# Patient Record
Sex: Male | Born: 1950 | Race: White | Hispanic: No | Marital: Married | Smoking: Never smoker
Health system: Southern US, Community
[De-identification: ages and names within clinical notes are randomized; demographics above are authoritative.]

## PROBLEM LIST (undated history)

## (undated) ENCOUNTER — Ambulatory Visit: Discharge status: Absent without leave | Payer: Managed Care, Other (non HMO)

## (undated) DIAGNOSIS — H9191 Unspecified hearing loss, right ear: Secondary | ICD-10-CM

## (undated) DIAGNOSIS — B3781 Candidal esophagitis: Secondary | ICD-10-CM

## (undated) DIAGNOSIS — T884XXA Failed or difficult intubation, initial encounter: Secondary | ICD-10-CM

## (undated) DIAGNOSIS — N200 Calculus of kidney: Secondary | ICD-10-CM

## (undated) DIAGNOSIS — G4730 Sleep apnea, unspecified: Secondary | ICD-10-CM

## (undated) DIAGNOSIS — K222 Esophageal obstruction: Secondary | ICD-10-CM

## (undated) DIAGNOSIS — E039 Hypothyroidism, unspecified: Secondary | ICD-10-CM

## (undated) DIAGNOSIS — Z87442 Personal history of urinary calculi: Secondary | ICD-10-CM

## (undated) DIAGNOSIS — G7000 Myasthenia gravis without (acute) exacerbation: Secondary | ICD-10-CM

## (undated) DIAGNOSIS — E119 Type 2 diabetes mellitus without complications: Secondary | ICD-10-CM

## (undated) DIAGNOSIS — I10 Essential (primary) hypertension: Secondary | ICD-10-CM

## (undated) DIAGNOSIS — E079 Disorder of thyroid, unspecified: Secondary | ICD-10-CM

## (undated) DIAGNOSIS — K219 Gastro-esophageal reflux disease without esophagitis: Secondary | ICD-10-CM

## (undated) HISTORY — PX: TONSILLECTOMY: SUR1361

## (undated) HISTORY — PX: KIDNEY STONE SURGERY: SHX686

## (undated) HISTORY — DX: Candidal esophagitis: B37.81

## (undated) HISTORY — PX: CHOLECYSTECTOMY: SHX55

## (undated) HISTORY — PX: PALATE SURGERY: SHX729

## (undated) HISTORY — PX: NASAL SEPTUM SURGERY: SHX37

## (undated) HISTORY — DX: Gastro-esophageal reflux disease without esophagitis: K21.9

## (undated) HISTORY — DX: Esophageal obstruction: K22.2

## (undated) HISTORY — PX: HERNIA REPAIR: SHX51

## (undated) HISTORY — PX: THYMECTOMY: SHX1063

---

## 2000-03-19 ENCOUNTER — Encounter: Payer: Self-pay | Admitting: Otolaryngology

## 2000-03-19 ENCOUNTER — Encounter: Admission: RE | Admit: 2000-03-19 | Discharge: 2000-03-19 | Payer: Self-pay | Admitting: Otolaryngology

## 2000-03-21 ENCOUNTER — Encounter (INDEPENDENT_AMBULATORY_CARE_PROVIDER_SITE_OTHER): Payer: Self-pay | Admitting: *Deleted

## 2000-03-21 ENCOUNTER — Ambulatory Visit (HOSPITAL_BASED_OUTPATIENT_CLINIC_OR_DEPARTMENT_OTHER): Admission: RE | Admit: 2000-03-21 | Discharge: 2000-03-21 | Payer: Self-pay | Admitting: Otolaryngology

## 2001-04-01 ENCOUNTER — Encounter: Payer: Self-pay | Admitting: Internal Medicine

## 2001-04-01 ENCOUNTER — Ambulatory Visit (HOSPITAL_COMMUNITY): Admission: RE | Admit: 2001-04-01 | Discharge: 2001-04-01 | Payer: Self-pay | Admitting: Internal Medicine

## 2002-02-07 ENCOUNTER — Encounter: Payer: Self-pay | Admitting: Internal Medicine

## 2002-02-07 ENCOUNTER — Ambulatory Visit (HOSPITAL_COMMUNITY): Admission: RE | Admit: 2002-02-07 | Discharge: 2002-02-07 | Payer: Self-pay | Admitting: Internal Medicine

## 2002-02-17 ENCOUNTER — Ambulatory Visit (HOSPITAL_COMMUNITY): Admission: RE | Admit: 2002-02-17 | Discharge: 2002-02-17 | Payer: Self-pay | Admitting: Family Medicine

## 2002-02-17 ENCOUNTER — Encounter: Payer: Self-pay | Admitting: Family Medicine

## 2002-07-13 ENCOUNTER — Emergency Department (HOSPITAL_COMMUNITY): Admission: EM | Admit: 2002-07-13 | Discharge: 2002-07-13 | Payer: Self-pay | Admitting: Internal Medicine

## 2003-06-15 ENCOUNTER — Encounter: Payer: Self-pay | Admitting: Family Medicine

## 2003-06-15 ENCOUNTER — Ambulatory Visit (HOSPITAL_COMMUNITY): Admission: RE | Admit: 2003-06-15 | Discharge: 2003-06-15 | Payer: Self-pay | Admitting: Family Medicine

## 2003-11-21 HISTORY — PX: COLONOSCOPY: SHX174

## 2004-09-30 ENCOUNTER — Ambulatory Visit (HOSPITAL_COMMUNITY): Admission: RE | Admit: 2004-09-30 | Discharge: 2004-09-30 | Payer: Self-pay | Admitting: General Surgery

## 2006-11-28 ENCOUNTER — Emergency Department (HOSPITAL_COMMUNITY): Admission: EM | Admit: 2006-11-28 | Discharge: 2006-11-28 | Payer: Self-pay | Admitting: Emergency Medicine

## 2008-07-29 ENCOUNTER — Ambulatory Visit (HOSPITAL_COMMUNITY): Admission: RE | Admit: 2008-07-29 | Discharge: 2008-07-29 | Payer: Self-pay | Admitting: Internal Medicine

## 2008-09-10 ENCOUNTER — Encounter (HOSPITAL_COMMUNITY): Admission: RE | Admit: 2008-09-10 | Discharge: 2008-11-17 | Payer: Self-pay | Admitting: Endocrinology

## 2009-05-19 ENCOUNTER — Ambulatory Visit (HOSPITAL_COMMUNITY): Admission: RE | Admit: 2009-05-19 | Discharge: 2009-05-19 | Payer: Self-pay | Admitting: Family Medicine

## 2009-05-24 ENCOUNTER — Emergency Department (HOSPITAL_COMMUNITY): Admission: EM | Admit: 2009-05-24 | Discharge: 2009-05-24 | Payer: Self-pay | Admitting: Emergency Medicine

## 2009-05-30 ENCOUNTER — Emergency Department (HOSPITAL_COMMUNITY): Admission: EM | Admit: 2009-05-30 | Discharge: 2009-05-30 | Payer: Self-pay | Admitting: Emergency Medicine

## 2009-06-01 ENCOUNTER — Ambulatory Visit (HOSPITAL_COMMUNITY): Admission: RE | Admit: 2009-06-01 | Discharge: 2009-06-01 | Payer: Self-pay | Admitting: Urology

## 2009-06-16 ENCOUNTER — Ambulatory Visit (HOSPITAL_COMMUNITY): Admission: RE | Admit: 2009-06-16 | Discharge: 2009-06-16 | Payer: Self-pay | Admitting: Urology

## 2009-06-30 ENCOUNTER — Ambulatory Visit (HOSPITAL_COMMUNITY): Admission: RE | Admit: 2009-06-30 | Discharge: 2009-06-30 | Payer: Self-pay | Admitting: Urology

## 2009-07-14 ENCOUNTER — Ambulatory Visit (HOSPITAL_COMMUNITY): Admission: RE | Admit: 2009-07-14 | Discharge: 2009-07-14 | Payer: Self-pay | Admitting: Urology

## 2009-07-16 ENCOUNTER — Ambulatory Visit (HOSPITAL_COMMUNITY): Admission: RE | Admit: 2009-07-16 | Discharge: 2009-07-16 | Payer: Self-pay | Admitting: Urology

## 2009-07-16 ENCOUNTER — Encounter (INDEPENDENT_AMBULATORY_CARE_PROVIDER_SITE_OTHER): Payer: Self-pay | Admitting: Urology

## 2010-07-21 DIAGNOSIS — K222 Esophageal obstruction: Secondary | ICD-10-CM | POA: Insufficient documentation

## 2010-07-21 HISTORY — DX: Esophageal obstruction: K22.2

## 2010-07-21 HISTORY — PX: UPPER GASTROINTESTINAL ENDOSCOPY: SHX188

## 2010-08-06 ENCOUNTER — Ambulatory Visit: Payer: Self-pay | Admitting: Gastroenterology

## 2010-08-06 ENCOUNTER — Ambulatory Visit (HOSPITAL_COMMUNITY): Admission: EM | Admit: 2010-08-06 | Discharge: 2010-08-06 | Payer: Self-pay | Admitting: Emergency Medicine

## 2010-09-07 ENCOUNTER — Encounter: Payer: Self-pay | Admitting: Gastroenterology

## 2010-09-07 ENCOUNTER — Encounter (INDEPENDENT_AMBULATORY_CARE_PROVIDER_SITE_OTHER): Payer: Self-pay | Admitting: *Deleted

## 2010-09-21 ENCOUNTER — Encounter (INDEPENDENT_AMBULATORY_CARE_PROVIDER_SITE_OTHER): Payer: Self-pay | Admitting: *Deleted

## 2010-12-22 NOTE — Miscellaneous (Signed)
Summary: OP REPORT  Clinical Lists Changes  [name], [name]               ACCOUNT NO.:  [id number]      MEDICAL RECORD NO.:  [id number]          PATIENT TYPE:  EMS      LOCATION:  ED                            FACILITY:  [facility]      PHYSICIAN:  Jonette Eva, M.D.     DATE OF BIRTH:  [birth year]      DATE OF PROCEDURE:  08/06/2010   DATE OF DISCHARGE:                                  OPERATIVE REPORT      THIS DOCUMENT HAS NOT BEEN REVIEWED OR ELECTRONICALLY SIGNED BY THE   DICTATOR OR ATTENDING PHYSICIAN      REFERRING PHYSICIAN:  Nicoletta Dress. Colon Branch, MD      PRIMARY CARE PHYSICIAN:  Corey Rear. Fusco, MD      PROCEDURE:  Esophagogastroduodenoscopy with cold forceps biopsy of the   gastric mucosa, removal of food impaction and Savary dilation of 16 mm.      INDICATION FOR EXAM:  [name] is a 60 year old male who felt chicken   get hung in his esophagus on Monday.  He was eating steak last night   around 6:30 and has been unable to swallow even his own secretions.      FINDINGS:   1. Food impaction in the mid to distal esophagus.  The food impaction       was reduced in size and able to be advanced into the stomach.   2. 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  Mid to distal       esophageal stricture.  No evidence of erosions, ulcerations, or       Barrett.   3. Small hiatal hernia.   4. Diffuse erythema in the stomach without ulcerations.  Biopsies       obtained via cold forceps to evaluate for H. pylori gastritis.   5. Normal duodenal bulb and second portion of the duodenum.   6. Hiatal hernia.      DIAGNOSES:   1. Impacted food bolus secondary to peptic stricture.   2. Mild mucosal trauma without disruption in the mid esophagus.   3. Moderate gastritis.      RECOMMENDATIONS:   1. He should take Prilosec 20 mg every morning indefinitely.   2. Follow up in 3 months with [name] regarding dysphagia.   3. We will call him with the results of his biopsies.   If he has H.       pylori, we will treat with antibiotics.   4. He may resume his previous diet.      MEDICATIONS:   1. Demerol 75 mg IV.   2. Versed 5 mg IV.      PROCEDURE TECHNIQUE:  Physical exam was performed.  Informed consent was   obtained from the patient after explaining the benefits, risks, and   alternatives to the procedure.  The patient was connected to monitor and   placed in left lateral position.  Continuous oxygen was provided by   nasal cannula and IV medicine was administered with an indwelling  cannula.  After administration of sedation, the patient's esophagus was   intubated.  Scope was advanced under direct visualization to the mid to   distal esophagus where the impacted food bolus was identified.  The   scope was withdrawn.  The esophageal overtube was placed over the   diagnostic gastroscope.  The gastroscope was advanced under direct   visualization to the mid esophagus.  The over tube was advanced with   mild resistance.  The scope was withdrawn and the internal cannula was   removed.  The food bolus was educed in size with a Talon grasping   device.  It was able to be, then subsequently, progressed to the distal   esophagus in the small hiatal hernia.  The scope was advanced under   direct visualization to the second portion of the duodenum.  The   biopsies were obtained in the stomach.  The scope and the overtube were   withdrawn.  The patient's esophagus was then intubated and advanced   under direct visualization to the antrum.  The Savary guidewire was   placed as the scope was withdrawn.  The scope was drawn by carefully   examining the color, texture, anatomy, and integrity mucosa on the way   out.  The patient's esophagus was dilated from 12.8 to 16 mm.  The   dilators passed with mild resistance.  The dilator and guidewire were   removed.  The patient was recovered in endoscopy and discharged home in   satisfactory condition.         PATH:  CHRONIC GASTRITIS      Jonette Eva, M.D.            THIS DOCUMENT HAS NOT BEEN REVIEWED OR ELECTRONICALLY SIGNED BY THE   DICTATOR OR ATTENDING PHYSICIAN      SF/MEDQ  D:  08/06/2010  T:  08/06/2010  Job:  [id number]      cc:   Corey Rear. Sherwood Gambler, MD   Fax: [phone]      Electronically Signed by Jonette Eva M.D. on 09/06/2010 04:54:19 PM

## 2010-12-22 NOTE — Letter (Signed)
Summary: Recall Office Visit  [facility]  [address]   [place], [place] [address]   Phone: [phone]  Fax: [phone]      September 21, 2010   [name] [address] [place], [place]  [address] 1951-04-13   Dear [name],   According to our records, it is time for you to schedule a follow-up office visit with [place].   At your convenience, please call [phone] to schedule an office visit. If you have any questions, concerns, or feel that this letter is in error, we would appreciate your call.   Sincerely,    Diana Eves  [facility] Ph: [phone]   Fax: [phone]

## 2010-12-22 NOTE — Letter (Signed)
Summary: path/op report  path/op report   Imported By: Diana Eves 09/07/2010 11:28:49  _____________________________________________________________________  External Attachment:    Type:   Image     Comment:   External Document

## 2010-12-22 NOTE — Miscellaneous (Signed)
Summary: PATHOLOGY  Clinical Lists Changes SP-Surgical Pathology - STATUS: Final  .                                         Perform Date: 17Sep11 00:01  Ordered By: DEFAULT MD , PROVIDER         Ordered Date:  Facility: [facility]                               Department: CPATH  Service Report Text  [facility]   [address]   [place] , [place] [address]   Telephone : [phone] Fax : [phone]    REPORT OF SURGICAL PATHOLOGY    Accession #: [id number]   Patient Name: [name], [name]   Visit # : [id number]    MRN: [id number]   Physician: Jonette Eva   DOB/Age 07-04-1951 (Age: 21) Gender: M   Collected Date: 08/06/2010   Received Date: 08/08/2010    FINAL DIAGNOSIS    1. Stomach, biopsy, :   BENIGN GASTRIC MUCOSA WITH CHRONIC GASTRITIS. NO HELICOBACTER PYLORI ORGANISMS   OR INTESTINAL METAPLASIA IDENTIFIED.    DATE SIGNED OUT: 08/09/2010   ELECTRONIC SIGNATURE : Smir M.D., Bassam, Pathologist, Electronic Signature    MICROSCOPIC DESCRIPTION   1. A Warthin-Starry stain is performed to determine the possibility of the   presence of Helicobacter pylori. The Warthin-Starry stain is negative for   organisms of Helicobacter pylori.    CASE COMMENTS   STAINS USED IN DIAGNOSIS:   Warthin-Starry Stain    CLINICAL HISTORY    SPECIMEN(S) OBTAINED   1. Stomach, biopsy,    SPECIMEN COMMENTS:   SPECIMEN CLINICAL INFORMATION:   1. gastritis    Gross Description   1. Received in formalin are tan, soft tissue fragments that are submitted in   toto. Number: Four pieces, Size: 0.3 to 0.7 cm, (1 B). TB/eps 08/08/10    Report signed out from the following location(s)   [place]   [address], [place] [address] CLIA #: [id number]    [facility]   [address] [place], [place] [address] CLIA #: [id number]  Additional Information  HL7 RESULT STATUS : F  External IF Update Timestamp : 2010-08-09:20:29:00.000000

## 2010-12-30 ENCOUNTER — Encounter: Payer: Self-pay | Admitting: Urgent Care

## 2011-01-05 NOTE — Medication Information (Signed)
Summary: OMEPRAZOLE 20MG   OMEPRAZOLE 20MG    Imported By: Rexene Alberts 12/30/2010 10:56:45  _____________________________________________________________________  External Attachment:    Type:   Image     Comment:   External Document  Appended Document: OMEPRAZOLE 20MG  Please sched 3 mo FU re: FU peptic stricture   Prescriptions: OMEPRAZOLE 20 MG CPDR (OMEPRAZOLE) 1 by mouth daily for acid reflux  #31 x 2   Entered and Authorized by:   Joselyn Arrow FNP-BC   Signed by:   Joselyn Arrow FNP-BC on 12/30/2010   Method used:   Electronically to        [facility] (retail)       [address]       [place], [place]  [address]       Ph: [phone]       Fax: [phone]   RxID:   [id number]

## 2011-01-11 ENCOUNTER — Encounter (INDEPENDENT_AMBULATORY_CARE_PROVIDER_SITE_OTHER): Payer: Self-pay | Admitting: *Deleted

## 2011-01-17 NOTE — Letter (Signed)
Summary: Recall Office Visit  [facility]  [address]   [place], [place] [address]   Phone: [phone]  Fax: [phone]      January 11, 2011   Nixxon Keelan [address] [place], [place]  [address] [birth year]   Dear Mr. Patrie,   According to our records, it is time for you to schedule a follow-up office visit with [place].   At your convenience, please call [phone] to schedule an office visit. If you have any questions, concerns, or feel that this letter is in error, we would appreciate your call.   Sincerely,    Diana Eves  [facility] Ph: [phone]   Fax: [phone]

## 2011-02-25 LAB — CBC
HCT: 42.1 % (ref 39.0–52.0)
Hemoglobin: 14.9 g/dL (ref 13.0–17.0)
MCHC: 35.5 g/dL (ref 30.0–36.0)
MCV: 90.5 fL (ref 78.0–100.0)
Platelets: 198 10*3/uL (ref 150–400)
RBC: 4.66 MIL/uL (ref 4.22–5.81)
RDW: 13.6 % (ref 11.5–15.5)
WBC: 5 10*3/uL (ref 4.0–10.5)

## 2011-02-25 LAB — BASIC METABOLIC PANEL
BUN: 12 mg/dL (ref 6–23)
CO2: 30 mEq/L (ref 19–32)
Calcium: 9.5 mg/dL (ref 8.4–10.5)
Chloride: 102 mEq/L (ref 96–112)
Creatinine, Ser: 1.03 mg/dL (ref 0.4–1.5)
GFR calc Af Amer: >60 mL/min (ref >60)
GFR calc non Af Amer: >60 mL/min (ref >60)
Glucose, Bld: 203 mg/dL — ABNORMAL HIGH (ref 70–99)
Potassium: 4.2 mEq/L (ref 3.5–5.1)
Sodium: 138 mEq/L (ref 135–145)

## 2011-02-25 LAB — STONE ANALYSIS: Stone Weight KSTONE: 0.125 g

## 2011-02-25 LAB — GLUCOSE, CAPILLARY: Glucose-Capillary: 144 mg/dL — ABNORMAL HIGH (ref 70–99)

## 2011-02-26 LAB — URINALYSIS, ROUTINE W REFLEX MICROSCOPIC
Bilirubin Urine: NEGATIVE
Bilirubin Urine: NEGATIVE
Glucose, UA: 250 mg/dL — AB
Glucose, UA: NEGATIVE mg/dL
Hgb urine dipstick: NEGATIVE
Ketones, ur: NEGATIVE mg/dL
Leukocytes, UA: NEGATIVE
Nitrite: NEGATIVE
Nitrite: NEGATIVE
Protein, ur: NEGATIVE mg/dL
Protein, ur: NEGATIVE mg/dL
Specific Gravity, Urine: >1.03 — ABNORMAL HIGH (ref 1.005–1.030)
Specific Gravity, Urine: >1.03 — ABNORMAL HIGH (ref 1.005–1.030)
Urobilinogen, UA: 0.2 mg/dL (ref 0.0–1.0)
Urobilinogen, UA: 0.2 mg/dL (ref 0.0–1.0)
pH: 5.5 (ref 5.0–8.0)
pH: 5.5 (ref 5.0–8.0)

## 2011-02-26 LAB — URINE MICROSCOPIC-ADD ON

## 2011-02-26 LAB — URINE CULTURE
Colony Count: NO GROWTH
Culture: NO GROWTH

## 2011-02-26 LAB — POCT I-STAT, CHEM 8
BUN: 14 mg/dL (ref 6–23)
Calcium, Ion: 1.23 mmol/L (ref 1.12–1.32)
Chloride: 102 mEq/L (ref 96–112)
Creatinine, Ser: 1 mg/dL (ref 0.4–1.5)
Glucose, Bld: 208 mg/dL — ABNORMAL HIGH (ref 70–99)
HCT: 42 % (ref 39.0–52.0)
Hemoglobin: 14.3 g/dL (ref 13.0–17.0)
Potassium: 3.7 mEq/L (ref 3.5–5.1)
Sodium: 139 mEq/L (ref 135–145)
TCO2: 25 mmol/L (ref 0–100)

## 2011-02-27 LAB — CREATININE, SERUM
Creatinine, Ser: 0.91 mg/dL (ref 0.4–1.5)
GFR calc Af Amer: >60 mL/min (ref >60)
GFR calc non Af Amer: >60 mL/min (ref >60)

## 2011-03-01 ENCOUNTER — Ambulatory Visit (INDEPENDENT_AMBULATORY_CARE_PROVIDER_SITE_OTHER): Payer: Self-pay | Admitting: Gastroenterology

## 2011-03-01 ENCOUNTER — Encounter: Payer: Self-pay | Admitting: Gastroenterology

## 2011-03-01 VITALS — BP 129/84 | HR 85 | Temp 98.2°F | Ht 73.0 in | Wt 226.8 lb

## 2011-03-01 DIAGNOSIS — K219 Gastro-esophageal reflux disease without esophagitis: Secondary | ICD-10-CM

## 2011-03-01 DIAGNOSIS — K222 Esophageal obstruction: Secondary | ICD-10-CM

## 2011-03-01 MED ORDER — OMEPRAZOLE 20 MG PO CPDR: 20.0000 mg | DELAYED_RELEASE_CAPSULE | Freq: Every day | ORAL | Status: DC

## 2011-03-01 NOTE — Progress Notes (Signed)
  Subjective:    Patient ID: [name], male    DOB: 03-Oct-1951, 60 y.o.   MRN: [id number]  HPI Pt last seen for food impaction. EGD/dilation to 16 mm. No swallowing problems. Taking OMP every day.  Past Medical History  Diagnosis Date  . GERD (gastroesophageal reflux disease)   . Esophageal stricture SEP 2011    FOOD IMPACTION-->SAV DIL 16 MM     Review of Systems     Objective:   Physical Exam  Constitutional: He appears well-developed and well-nourished. No distress.  HENT:  Head: Normocephalic and atraumatic.  Cardiovascular: Normal rate and regular rhythm.   Pulmonary/Chest: Effort normal and breath sounds normal.  Abdominal: Soft. Bowel sounds are normal.          Assessment & Plan:

## 2011-03-02 ENCOUNTER — Encounter: Payer: Self-pay | Admitting: Gastroenterology

## 2011-03-02 DIAGNOSIS — K219 Gastro-esophageal reflux disease without esophagitis: Secondary | ICD-10-CM | POA: Insufficient documentation

## 2011-03-02 DIAGNOSIS — K222 Esophageal obstruction: Secondary | ICD-10-CM | POA: Insufficient documentation

## 2011-03-02 NOTE — Assessment & Plan Note (Signed)
Pt doing well. No problems swallowing or with food impaction. Continue Prilosec indefinitely. OPV in 12 mos.

## 2011-03-02 NOTE — Progress Notes (Signed)
Reminder in epic to follow up in one year °

## 2011-03-08 ENCOUNTER — Ambulatory Visit: Payer: Self-pay | Admitting: Gastroenterology

## 2011-04-04 NOTE — Op Note (Signed)
NAMEHOLLAND, [name]               ACCOUNT NO.:  [id number]   MEDICAL RECORD NO.:  [id number]          PATIENT TYPE:  AMB   LOCATION:  DAY                           FACILITY:  [facility]   PHYSICIAN:  Dennie Maizes, M.D.   DATE OF BIRTH:  08-Sep-1951   DATE OF PROCEDURE:  07/16/2009  DATE OF DISCHARGE:  07/16/2009                               OPERATIVE REPORT   PREOPERATIVE DIAGNOSIS:  Left distal ureteral calculus with obstruction,  post extracorporeal shockwave lithotripsy.   POSTOPERATIVE DIAGNOSIS:  Left distal ureteral calculus with  obstruction, post extracorporeal shockwave lithotripsy.   OPERATIVE PROCEDURES:  Cystoscopy, left retrograde pyelogram, left  ureteroscopic stone extraction, and left ureteral stent placement.   ANESTHESIA:  Spinal.   SURGEON:  Dennie Maizes, MD   COMPLICATIONS:  None.   INDICATIONS FOR PROCEDURE:  This 60 year old male had a left distal  ureteral calculus with obstruction.  He has undergone ESL about a month  ago.  He is unable to pass the stone fragments.  He was taken to the  operating room today for cystoscopy, left retrograde pyelogram,  ureteroscopic stone extraction, and a stent placement.   DESCRIPTION OF PROCEDURE:  Spinal anesthesia was induced and the patient  was placed on the OR table in the dorsal lithotomy position.  Lower  abdomen and genitalia were prepped and draped in a sterile fashion.  Cystoscopy was done with the 20-French scope.  Urethra was normal.  There was moderate hypertrophy of the prostate with partial obstruction  of bladder neck area.  The bladder was not examined.  There was  prominence and erythema of the left ureteral orifice and intramural  ureter.  The rest of the bladder mucosa was normal.  A 5-French wedge  catheter was then placed in the left ureteral orifice.  About 5 mL of  Renografin-60 were injected into the collecting system. This revealed  dilated left distal ureter with a filling defects suggestive  of the  presence of the stone fragments.   A 5-French open-ended catheter was then placed in the left ureteral  orifice.  A 0.13-gauge Bentson guide was then inserted to the left renal  pelvis without any difficulty.  The distal ureter was dilated using an  18-French 10 cm length balloon dilating catheter.  The balloon dilating  catheter was then removed leaving the guidewire in place.  Ureteroscopy  was done with a 0.5 French rigid ureteroscope.  There is a 7-mm size  stone with small fragments surrounding it were noted in the left distal  ureter about 6 cm above the ureteral orifice.  The stones were extracted  using a nitinol 0 wire basket without any difficulty.  Examination of  the ureter was done after the pelvic brim and no  other abnormality was noted.  A 6.26-cm size stent was then inserted to  the left collecting system.  The instruments were removed.  The patient  will return to the office in a few days for removal of the left ureteral  stent with a string.  He was transferred to the PACU in a satisfactory  condition.  Dennie Maizes, M.D.  Electronically Signed     SK/MEDQ  D:  07/16/2009  T:  07/17/2009  Job:  [id number]   cc:   Kirk Ruths, M.D.  Fax: [phone]

## 2011-04-04 NOTE — H&P (Signed)
[name], [name]               ACCOUNT NO.:  [id number]   MEDICAL RECORD NO.:  [id number]         PATIENT TYPE:  AMB   LOCATION:  DAY                           FACILITY:  [facility]   PHYSICIAN:  Dennie Maizes, M.D.   DATE OF BIRTH:  Jun 14, 1951   DATE OF ADMISSION:  07/16/2009  DATE OF DISCHARGE:  LH                              HISTORY & PHYSICAL   CHIEF COMPLAINT:  Left flank pain, left distal ureteral calculi with  obstruction.   HISTORY OF PRESENT ILLNESS:  This 60 year old male was referred to me  frm the ER at [facility]>  He has had recurrent urolithiasis for several years.  He passed several stones so far.  Was evaluated for left flank pain last  month with a CT scan of the abdomen and pelvis.  This revealed a 6 mm  sized stone in the ureteropelvic junction area on the left side with  obstruction and hydronephrosis.  A follow-up KUB revealed the stone had  migrated to the distal ureter at the level of the sacrum.  The patient  has undergone extracorporeal shock wave lithotripsy and left distal  ureteral calculus at [facility] about one month ago.  The stone has been fragmented.  The stone fragments have migrated to the  ureterovesical junction area.  The patient is unable to pass the stone  fragments.  He is brought to the [facility] today for  cystoscopy, left retrograde pyelogram, left ureteroscopy and stone  extraction with stent placement.  He denied having fever, chills,  voiding difficulty or gross hematuria at present.   PAST MEDICAL/SURGICAL HISTORY:  1. History of hypertension.  2. Myasthenia gravis.  3. Status post thymectomy.  4. Status post cholecystectomy.  5. Hypothyroidism.   MEDICATIONS:  1. Benicar 20 mg, one p.o  2. Imuran 100 mg, one p.o. daily.  3. Calcium.  4. Vitamin C.  5. Vitamin E.  6. Fish oil.   ALLERGIES:  LEVAQUIN AND CETACAINE.   PHYSICAL EXAMINATION:  VITAL SIGNS:  Height 6 feet, one inch, weight 230  pounds.  HEENT:  Head, Eyes, Ears, Nose and Throat:  Normal.  LUNGS:  Clear to auscultation.  HEART:  Regular rate and rhythm.  No murmurs.  ABDOMEN:  No palpable flank mass or tenderness.  NEUROLOGIC:  Normal.  GENITOURINARY:  Bladder is not palpable.  Penis and testes are normal.   IMPRESSION/PLAN:  Post-extracorporal shock wave lithotripsy, left  ureteral calculus, left ureteral stone fragments with obstruction,  cystoscopy, left retrograde pyelograms, left ureteroscopy, basket  extraction of stone fragments and stent placement at the [facility].  I have informed the patient regarding the diagnoses, the  operative details, the postoperative treatment, the outcome, the  possible risks and complications, and he has agreed for the procedure to  be done.      Dennie Maizes, M.D.  Electronically Signed     SK/MEDQ  D:  07/15/2009  T:  07/15/2009  Job:  [id number]

## 2011-04-07 NOTE — Op Note (Signed)
[place]  Patient:    [name], [name]                      MRN: [id number] Proc. Date: 03/21/00 Adm. Date:  [id number] Disc. Date: [id number] Attending:  Fernande Boyden CC:         Gloris Manchester. Lazarus Salines, M.D.             Kari Baars, M.D.             [facility]                           Operative Report  PREOPERATIVE DIAGNOSIS:  Hypertrophic inferior turbinates bilateral.  POSTOPERATIVE DIAGNOSIS:  Hypertrophic inferior turbinates bilateral.  OPERATION PERFORMED:  Bilateral submucous resection of inferior turbinates.  SURGEON:  Gloris Manchester. Lazarus Salines, M.D.  ANESTHESIA:  General orotracheal.  ESTIMATED BLOOD LOSS:  Minimal.  COMPLICATIONS:  None.  FINDINGS:  A mild rightward septal deviation.  Bulky inferior turbinates bilateral both bony and mucosal components.  DESCRIPTION OF PROCEDURE:  With the patient in a comfortable supine position, general orotracheal anesthesia was induced without difficulty.  At an appropriate level, the patient was placed in a slight sitting position.  A saline moistened throat pack was placed.  Nasal vibrissae were trimmed. Cocaine solution, 160 mg total was applied on 1/2 x 3 inch cottonoids to both sides of the septum.  1% Xylocaine with 1:100,000 epinephrine 8 cc total was infiltrated into the inferior turbinates on each side.  Approximately 5 minutes were allowed for anesthesia and hemostasis to take effect.  The materials were removed from the right nose and observed to be intact and corrrect in number.  The findings were as described above.  The anterior hood of the inferior turbinate was sharply lysed just behind the nasal valve.  The medial mucosa was incised in an anterior upsloping fashion and a laterally based flap was elevated.  The turbinate was infractured.  Using angled turbinate scissors, the turbinate bone and lateral mucosa was resected in a posterior downsloping fashion taking  virtually all the anterior pole and leaving virtually all of the posterior pole.  Bony spicules were carefully removed to allow more prompt healing.  The posterior pole was suctioned coagulated for hemostasis.  The flap was laid back down.  The turbinate was outfractured and this side was completed.  The left side was then done in an identical fashion.  Hemostasis was observed.  At this point quadruple thickness on the right and triple thickness Neosporin impregnated Telfa packs were fashioned and placed into the nose for hemostasis without difficulty. The pharynx was suctioned free and the throat pack was removed.  A drip pad was applied.  The patient was returned to anesthesia, awakened, extubated and transferred to recovery in stable condition.  COMMENT:  A 60 year old white male with nasal obstruction especially in the supine position precluding effective CPAP usage for sleep apnea was the indication for todays procedure.  Anticipate a routine postoperative recovery with attention to analgesia, antibiosis, hydration, pack removal in two days, and subsequent nasal hygiene measures.  Given low anticipated risk of postanesthetic or postsurgical complications, I feel an outpatient venue is appropriate. DD:  03/21/00 TD:  03/23/00 Job: [id number]

## 2011-04-07 NOTE — H&P (Signed)
NAMEBENOIT, [name] NO.:  [id number]   MEDICAL RECORD NO.:  [id number]           PATIENT TYPE:   LOCATION:                                 FACILITY:   PHYSICIAN:  Dalia Heading, M.D.  DATE OF BIRTH:  11-16-1951   DATE OF ADMISSION:  DATE OF DISCHARGE:  LH                                HISTORY & PHYSICAL   CHIEF COMPLAINT:  History of diverticulosis, need for screening colonoscopy.   HISTORY OF PRESENT ILLNESS:  The patient is a 60 year old white male who was  referred for endoscopic evaluation.  He needs a colonoscopy for screening  purposes and a history of diverticulosis.  No abdominal pain, weight loss,  nausea, vomiting, diarrhea, constipation, melena, hematochezia, have been  noted.  He has never had a colonoscopy.  There is no family history of colon  carcinoma.  No history of hemorrhoidal disease.   PAST MEDICAL HISTORY:  Myasthenia gravis.   PAST SURGICAL HISTORY:  1.  Thymectomy.  2.  Herniorrhaphy.  3.  Cholecystectomy.  4.  Tonsillectomy.   CURRENT MEDICATIONS:  Imuran 150 mg p.o. daily.   ALLERGIES:  LEVAQUIN.   REVIEW OF SYSTEMS:  Noncontributory.   PHYSICAL EXAMINATION:  GENERAL:  The patient is a well-developed, well-  nourished white male in no acute distress.  VITAL SIGNS:  He is afebrile and vital signs are stable.  LUNGS:  Clear to auscultation with equal breath sounds bilaterally.  HEART:  Regular rate and rhythm without S3, S4, or murmurs.  ABDOMEN:  Soft, nontender, nondistended.  No hepatosplenomegaly or masses  are noted.  RECTAL:  Deferred to the procedure.   IMPRESSION:  Diverticulosis, need for screening colonoscopy.   PLAN:  The patient is scheduled for a colonoscopy on September 30, 2004.  The  risks and benefits of the procedure including bleeding, perforation were  fully explained to the patient, who gave informed consent.     Mark   MAJ/MEDQ  D:  09/22/2004  T:  09/22/2004  Job:  [id number]   cc:   Patrica Duel, M.D.  [address]  [place]  [place] [address]  Fax: [phone]

## 2011-05-05 IMAGING — CR DG CHEST 2V
3 series · 3 of 3 positions shown · non-contrast
Comparison: None.

CLINICAL DATA: Food stuck in throat.  Question foreign body.  Prior
thymectomy.

CHEST - 2 VIEW

[view not recorded (1 of 3)]
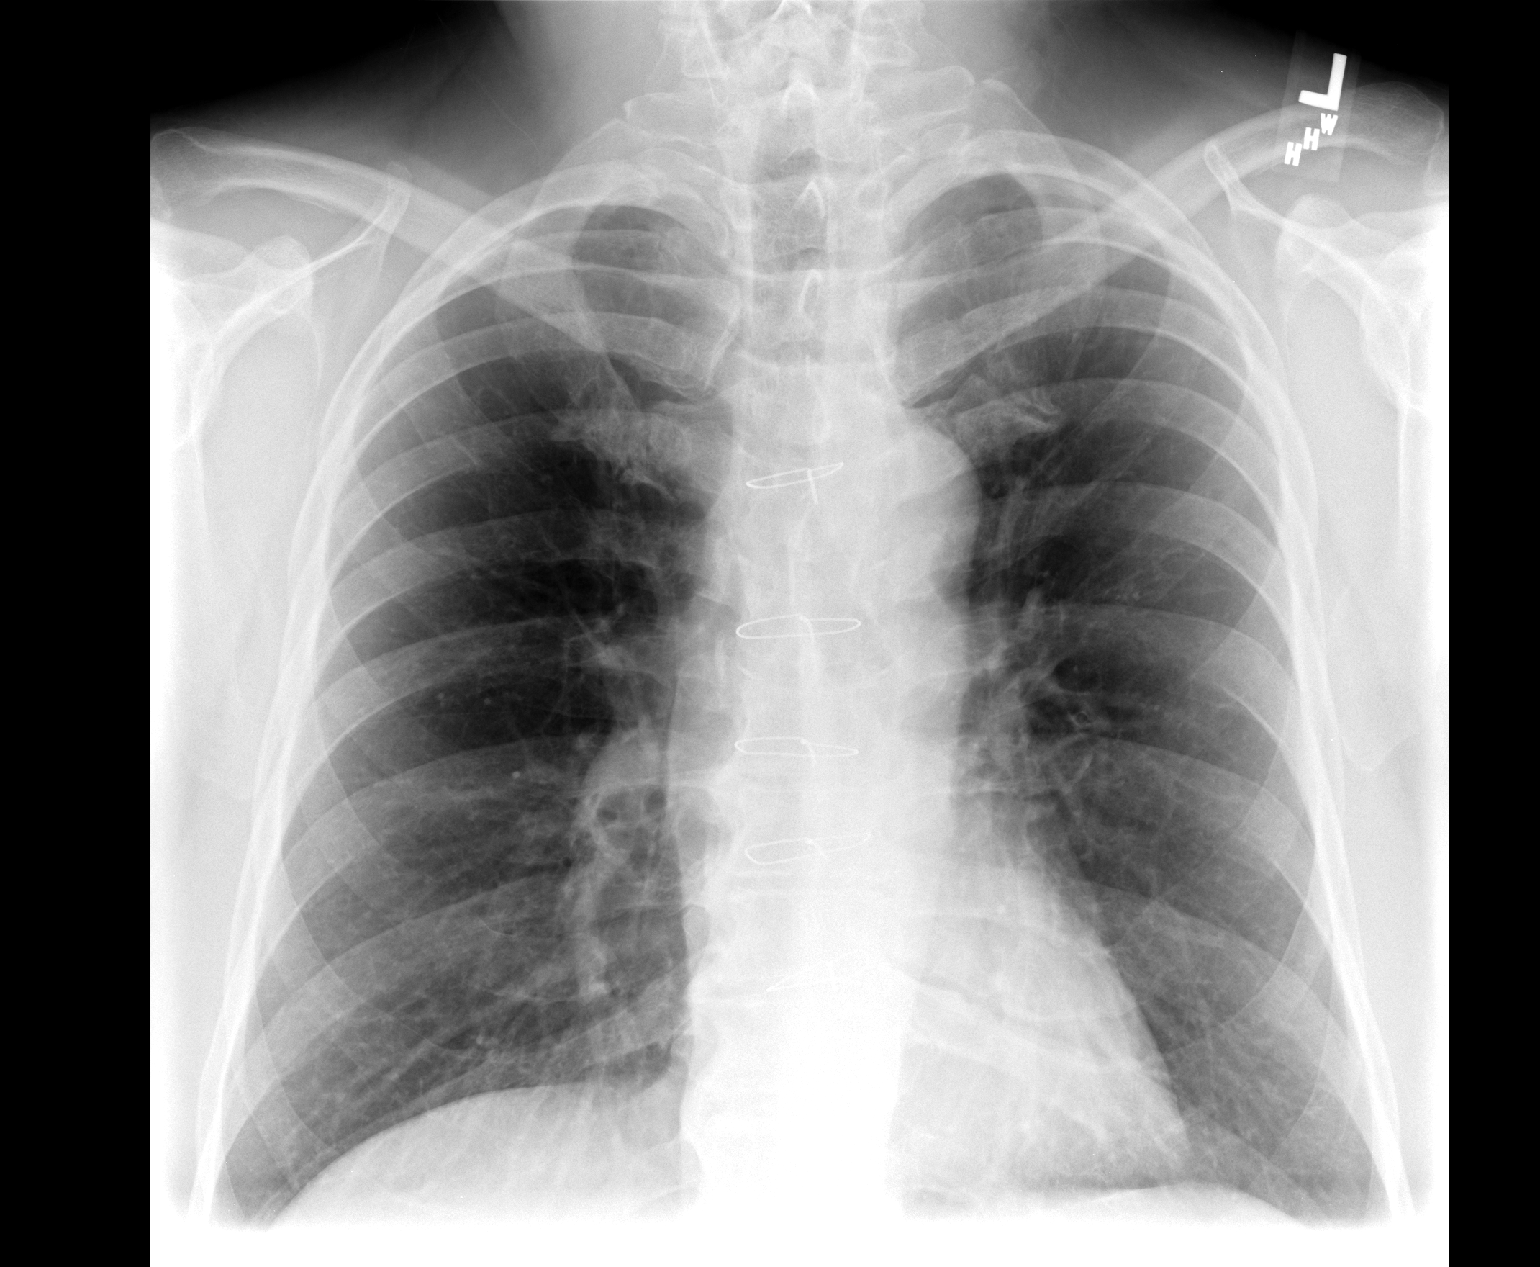

[view not recorded (2 of 3)]
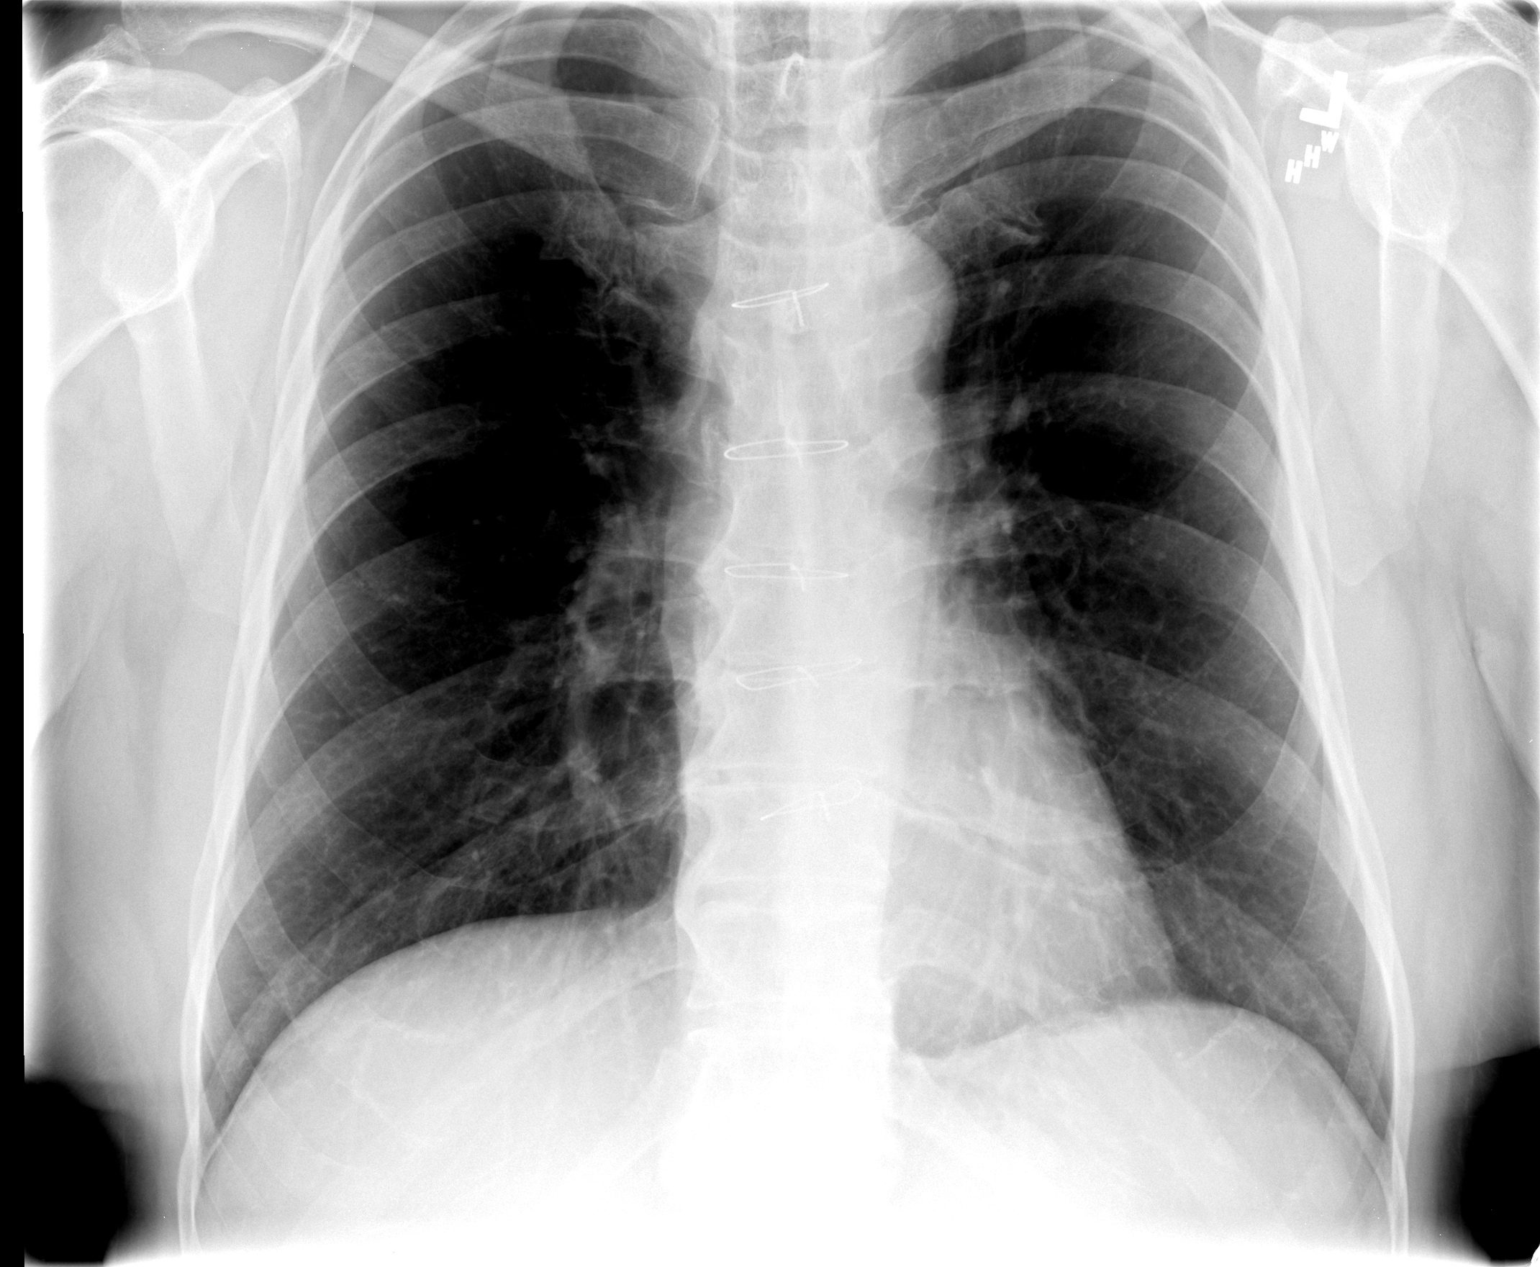

[view not recorded (3 of 3)]
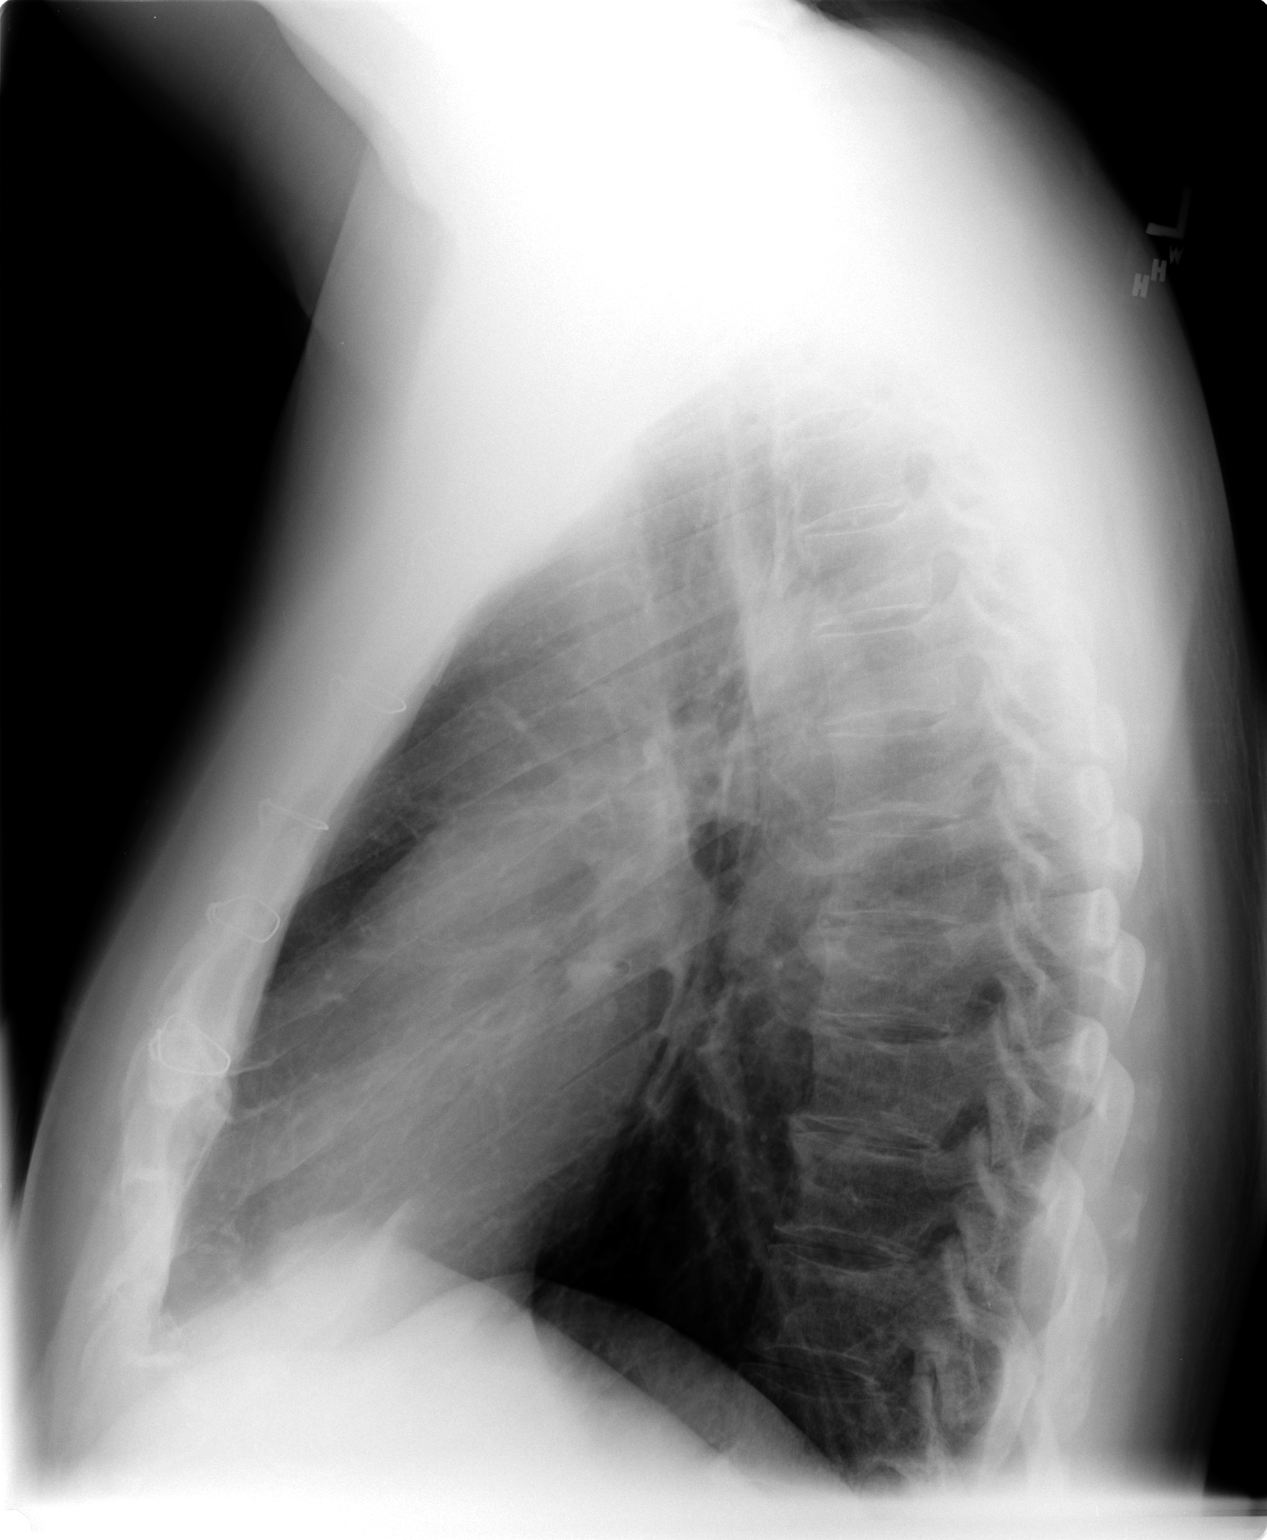

[3 of 3 positions shown; findings below may reference images not displayed]

FINDINGS: Prior median sternotomy. Midline trachea.  Normal heart
size and mediastinal contours. No pleural effusion or pneumothorax.

Clear lungs.
IMPRESSION: No acute cardiopulmonary disease.

## 2011-08-02 DIAGNOSIS — K219 Gastro-esophageal reflux disease without esophagitis: Secondary | ICD-10-CM

## 2011-08-07 ENCOUNTER — Other Ambulatory Visit: Payer: Self-pay

## 2011-08-07 DIAGNOSIS — K219 Gastro-esophageal reflux disease without esophagitis: Secondary | ICD-10-CM

## 2011-08-07 MED ORDER — OMEPRAZOLE 20 MG PO CPDR: 20.0000 mg | DELAYED_RELEASE_CAPSULE | Freq: Every day | ORAL | Status: DC

## 2012-02-20 ENCOUNTER — Encounter: Payer: Self-pay | Admitting: Gastroenterology

## 2012-06-07 ENCOUNTER — Encounter (HOSPITAL_COMMUNITY): Payer: Self-pay | Admitting: *Deleted

## 2012-06-07 ENCOUNTER — Emergency Department (HOSPITAL_COMMUNITY): Payer: Managed Care, Other (non HMO)

## 2012-06-07 ENCOUNTER — Observation Stay (HOSPITAL_COMMUNITY)
Admission: EM | Admit: 2012-06-07 | Discharge: 2012-06-08 | Disposition: A | Discharge status: Home / Self Care | Payer: Managed Care, Other (non HMO) | Attending: Internal Medicine | Admitting: Internal Medicine

## 2012-06-07 DIAGNOSIS — G7000 Myasthenia gravis without (acute) exacerbation: Secondary | ICD-10-CM | POA: Diagnosis present

## 2012-06-07 DIAGNOSIS — Z6829 Body mass index (BMI) 29.0-29.9, adult: Secondary | ICD-10-CM | POA: Insufficient documentation

## 2012-06-07 DIAGNOSIS — K219 Gastro-esophageal reflux disease without esophagitis: Secondary | ICD-10-CM | POA: Diagnosis present

## 2012-06-07 DIAGNOSIS — K222 Esophageal obstruction: Secondary | ICD-10-CM

## 2012-06-07 DIAGNOSIS — E669 Obesity, unspecified: Secondary | ICD-10-CM | POA: Insufficient documentation

## 2012-06-07 DIAGNOSIS — N390 Urinary tract infection, site not specified: Principal | ICD-10-CM | POA: Diagnosis present

## 2012-06-07 DIAGNOSIS — I10 Essential (primary) hypertension: Secondary | ICD-10-CM | POA: Diagnosis present

## 2012-06-07 DIAGNOSIS — R197 Diarrhea, unspecified: Secondary | ICD-10-CM | POA: Insufficient documentation

## 2012-06-07 HISTORY — DX: Essential (primary) hypertension: I10

## 2012-06-07 HISTORY — DX: Myasthenia gravis without (acute) exacerbation: G70.00

## 2012-06-07 HISTORY — DX: Disorder of thyroid, unspecified: E07.9

## 2012-06-07 HISTORY — DX: Calculus of kidney: N20.0

## 2012-06-07 LAB — CBC WITH DIFFERENTIAL/PLATELET
Basophils Absolute: 0 10*3/uL (ref 0.0–0.1)
Basophils Relative: 0 % (ref 0–1)
Eosinophils Absolute: 0 10*3/uL (ref 0.0–0.7)
Eosinophils Relative: 0 % (ref 0–5)
HCT: 46.4 % (ref 39.0–52.0)
Hemoglobin: 16.2 g/dL (ref 13.0–17.0)
Lymphocytes Relative: 5 % — ABNORMAL LOW (ref 12–46)
Lymphs Abs: 0.7 10*3/uL (ref 0.7–4.0)
MCH: 30.9 pg (ref 26.0–34.0)
MCHC: 34.9 g/dL (ref 30.0–36.0)
MCV: 88.5 fL (ref 78.0–100.0)
Monocytes Absolute: 1.3 10*3/uL — ABNORMAL HIGH (ref 0.1–1.0)
Monocytes Relative: 9 % (ref 3–12)
Neutro Abs: 12.4 10*3/uL — ABNORMAL HIGH (ref 1.7–7.7)
Neutrophils Relative %: 86 % — ABNORMAL HIGH (ref 43–77)
Platelets: 178 10*3/uL (ref 150–400)
RBC: 5.24 MIL/uL (ref 4.22–5.81)
RDW: 13.1 % (ref 11.5–15.5)
WBC: 14.5 10*3/uL — ABNORMAL HIGH (ref 4.0–10.5)

## 2012-06-07 LAB — BASIC METABOLIC PANEL
BUN: 11 mg/dL (ref 6–23)
CO2: 27 mEq/L (ref 19–32)
Calcium: 10.1 mg/dL (ref 8.4–10.5)
Chloride: 95 mEq/L — ABNORMAL LOW (ref 96–112)
Creatinine, Ser: 0.96 mg/dL (ref 0.50–1.35)
GFR calc Af Amer: >90 mL/min (ref >90)
GFR calc non Af Amer: 88 mL/min — ABNORMAL LOW (ref >90)
Glucose, Bld: 158 mg/dL — ABNORMAL HIGH (ref 70–99)
Potassium: 3.7 mEq/L (ref 3.5–5.1)
Sodium: 133 mEq/L — ABNORMAL LOW (ref 135–145)

## 2012-06-07 LAB — URINALYSIS, ROUTINE W REFLEX MICROSCOPIC
Bilirubin Urine: NEGATIVE
Glucose, UA: NEGATIVE mg/dL
Nitrite: NEGATIVE
Protein, ur: NEGATIVE mg/dL
Specific Gravity, Urine: <1.005 — ABNORMAL LOW (ref 1.005–1.030)
Urobilinogen, UA: 0.2 mg/dL (ref 0.0–1.0)
pH: 6 (ref 5.0–8.0)

## 2012-06-07 LAB — URINE MICROSCOPIC-ADD ON

## 2012-06-07 MED ORDER — VITAMIN E 180 MG (400 UNIT) PO CAPS
400.0000 [IU] | ORAL_CAPSULE | Freq: Every day | ORAL | Status: DC
  Administered 2012-06-08: 400 [IU] via ORAL
  Filled 2012-06-07 (×3): qty 1

## 2012-06-07 MED ORDER — ONDANSETRON HCL 4 MG PO TABS: 4.0000 mg | ORAL_TABLET | Freq: Four times a day (QID) | ORAL | Status: DC | PRN

## 2012-06-07 MED ORDER — SODIUM CHLORIDE 0.9 % IV SOLN
Freq: Once | INTRAVENOUS | Status: AC
  Administered 2012-06-07: 17:00:00 via INTRAVENOUS

## 2012-06-07 MED ORDER — ONDANSETRON HCL 4 MG/2ML IJ SOLN: 4.0000 mg | Freq: Four times a day (QID) | INTRAMUSCULAR | Status: DC | PRN

## 2012-06-07 MED ORDER — LOPERAMIDE HCL 2 MG PO CAPS
2.0000 mg | ORAL_CAPSULE | Freq: Four times a day (QID) | ORAL | Status: DC | PRN
  Administered 2012-06-07: 2 mg via ORAL
  Filled 2012-06-07: qty 1

## 2012-06-07 MED ORDER — PYRIDOSTIGMINE BROMIDE 60 MG PO TABS
30.0000 mg | ORAL_TABLET | Freq: Three times a day (TID) | ORAL | Status: DC
  Administered 2012-06-07 – 2012-06-08 (×2): 30 mg via ORAL
  Filled 2012-06-07 (×8): qty 0.5

## 2012-06-07 MED ORDER — PANTOPRAZOLE SODIUM 40 MG PO TBEC
40.0000 mg | DELAYED_RELEASE_TABLET | Freq: Every day | ORAL | Status: DC
  Administered 2012-06-07 – 2012-06-08 (×2): 40 mg via ORAL
  Filled 2012-06-07 (×2): qty 1

## 2012-06-07 MED ORDER — POTASSIUM CHLORIDE IN NACL 20-0.9 MEQ/L-% IV SOLN
INTRAVENOUS | Status: AC
  Administered 2012-06-07: via INTRAVENOUS

## 2012-06-07 MED ORDER — HYDROCODONE-ACETAMINOPHEN 5-325 MG PO TABS
1.0000 | ORAL_TABLET | ORAL | Status: DC | PRN
  Administered 2012-06-07: 1 via ORAL
  Filled 2012-06-07: qty 1

## 2012-06-07 MED ORDER — ONDANSETRON HCL 4 MG/2ML IJ SOLN: 4.0000 mg | Freq: Three times a day (TID) | INTRAMUSCULAR | Status: DC | PRN

## 2012-06-07 MED ORDER — ACYCLOVIR 400 MG PO TABS
400.0000 mg | ORAL_TABLET | Freq: Every day | ORAL | Status: DC
  Filled 2012-06-07 (×3): qty 1

## 2012-06-07 MED ORDER — DEXTROSE 5 % IV SOLN
1.0000 g | Freq: Once | INTRAVENOUS | Status: AC
  Administered 2012-06-07: 1 g via INTRAVENOUS
  Filled 2012-06-07: qty 10

## 2012-06-07 MED ORDER — ACETAMINOPHEN 325 MG PO TABS
650.0000 mg | ORAL_TABLET | Freq: Once | ORAL | Status: AC
  Administered 2012-06-07: 650 mg via ORAL
  Filled 2012-06-07: qty 2

## 2012-06-07 MED ORDER — CEFTRIAXONE SODIUM 1 G IJ SOLR
1.0000 g | INTRAMUSCULAR | Status: DC
  Administered 2012-06-08: 1 g via INTRAVENOUS
  Filled 2012-06-07 (×2): qty 10

## 2012-06-07 MED ORDER — AZATHIOPRINE 50 MG PO TABS
150.0000 mg | ORAL_TABLET | Freq: Every day | ORAL | Status: DC
  Administered 2012-06-08: 150 mg via ORAL
  Filled 2012-06-07 (×3): qty 2

## 2012-06-07 MED ORDER — SODIUM CHLORIDE 0.9 % IV SOLN: INTRAVENOUS | Status: DC

## 2012-06-07 NOTE — ED Notes (Signed)
Patient is comfortable and resting at this time.

## 2012-06-07 NOTE — H&P (Signed)
PCP:   Cassell Smiles., MD   Chief Complaint:  Chills  HPI: 61 year old male with a history of myasthenia gravis over the last 10 years who comes in with recently diagnosed bladder infection started on Keflex comes in because of chills. He denies any fevers. Denies any nausea or vomiting. He has had some urinary hesitancy but no dysuria. He denies any hematuria. Denies any abdominal pain. He was recently just started back on Mestinon for a recent flare up of his myasthenia gravis over the last 5 days and his flare has improved. We are being asked to observe the patient overnight because of his history of a myasthenia gravis with now underlying infection UTI.  Review of Systems:  Otherwise negative  Past Medical History: Past Medical History  Diagnosis Date  . GERD (gastroesophageal reflux disease)   . Esophageal stricture SEP 2011    FOOD IMPACTION-->SAV DIL 16 MM  . Myasthenia gravis   . Hypertension   . Thyroid disease   . Kidney stone    Past Surgical History  Procedure Date  . Upper gastrointestinal endoscopy SEP 2011    SAV DIL  . Kidney stone surgery     Medications: Prior to Admission medications   Medication Sig Start Date End Date Taking? Authorizing Provider  acyclovir (ZOVIRAX) 400 MG tablet Take 400 mg by mouth daily.   Yes Historical Provider, MD  Ascorbic Acid (VITAMIN C) 1000 MG tablet Take 1,000 mg by mouth daily.     Yes Historical Provider, MD  azathioprine (IMURAN) 100 MG tablet Take 150 mg by mouth daily.    Yes Historical Provider, MD  Calcium Carb-Cholecalciferol (CALCIUM 500 +D PO) Take 1 tablet by mouth daily.     Yes Historical Provider, MD  cephALEXin (KEFLEX) 500 MG capsule Take 500 mg by mouth 4 (four) times daily. For 14 days 06/07/12  Yes Historical Provider, MD  fish oil-omega-3 fatty acids 1000 MG capsule Take 1 g by mouth daily.     Yes Historical Provider, MD  levothyroxine (SYNTHROID, LEVOTHROID) 137 MCG tablet Take 137 mcg by mouth daily.    Yes Historical Provider, MD  omeprazole (PRILOSEC) 20 MG capsule Take 1 capsule (20 mg total) by mouth daily. 08/07/11  Yes Nira Retort, NP  pyridostigmine (MESTINON) 60 MG tablet Take 30 mg by mouth 3 (three) times daily.   Yes Historical Provider, MD  Saw Palmetto, Serenoa repens, (SAW PALMETTO BERRIES PO) Take 1 tablet by mouth daily.     Yes Historical Provider, MD  valsartan (DIOVAN) 80 MG tablet Take 80 mg by mouth daily.     Yes Historical Provider, MD  vitamin E 400 UNIT capsule Take 400 Units by mouth daily.     Yes Historical Provider, MD    Allergies:   Allergies  Allergen Reactions  . Levofloxacin Anaphylaxis  . Tequin (Gatifloxacin) Anaphylaxis  . Aminoglycosides Other (See Comments)    REACTION: Myasthenia Gravis (MG) Medication Alert  . Avelox (Moxifloxacin Hcl In Nacl) Other (See Comments)    REACTION: FLUOROQUINOLONE ANTIBIOTICS: Myasthenia Gravis (MG) Medication Alert  . Botox (Onabotulinumtoxina) Other (See Comments)    REACTION: Myasthenia Gravis (MG) Medication Alert  . Botulinum Toxins Other (See Comments)    REACTION: Myasthenia Gravis (MG) Medication Alert  . Calcium Channel Blockers Other (See Comments)    (BLOOD PRESSURE MEDICATION) REACTION: Myasthenia Gravis (MG) Medication Alert  . Ciprofloxacin Other (See Comments)    REACTION: FLUOROQUINOLONE ANTIBIOTICS: Myasthenia Gravis (MG) Medication Alert  . Curare (Tubocurarine)  Other (See Comments)    (Usually only used during surgery)  REACTION: Myasthenia Gravis (MG) Medication Alert  . Dysport (Abobotulinumtoxina) Other (See Comments)    REACTION: Myasthenia Gravis (MG) Medication Alert  . Erythromycin Other (See Comments)    REACTION: Myasthenia Gravis (MG) Medication Alert  . Factive (Gemifloxacin) Other (See Comments)    REACTION: FLUOROQUINOLONE ANTIBIOTICS: Myasthenia Gravis (MG) Medication Alert  . Floxin (Ofloxacin) Other (See Comments)    REACTION: FLUOROQUINOLONE ANTIBIOTICS: Myasthenia Gravis  (MG) Medication Alert  . Gentamycin (Gentamicin) Other (See Comments)    REACTION:Myasthenia Gravis (MG) Medication Alert  . Interferons Other (See Comments)    REACTION: Myasthenia Gravis (MG) Medication Alert  . Kanamycin Other (See Comments)    REACTION: Myasthenia Gravis (MG) Medication Alert  . Ketek (Telithromycin) Other (See Comments)    REACTION: Myasthenia Gravis (MG) Medication Alert  . Levaquin (Levofloxacin In D5w) Other (See Comments)    REACTION: FLUOROQUINOLONE ANTIBIOTICS: Myasthenia Gravis (MG) Medication Alert  . Macrolides And Ketolides Other (See Comments)    REACTION: Myasthenia Gravis (MG) Medication Alert  . Myobloc (Rimabotulinumtoxinb) Other (See Comments)    REACTION: Myasthenia Gravis (MG) Medication Alert  . Neomycin Other (See Comments)    REACTION: Myasthenia Gravis (MG) Medication Alert  . Noroxin (Norfloxacin) Other (See Comments)    REACTION: FLUOROQUINOLONE ANTIBIOTICS: Myasthenia Gravis (MG) Medication Alert  . Other Other (See Comments)    MAGNESIUM SALTS: Milk of Magnesia, some antacids (Maalox, Mylanta) REACTION: Myasthenia Gravis (MG) Medication Alerta  . Penicillamine Other (See Comments)    D-PENICILLAMINE *DO NOT CONFUSE WITH PENICILLIN*  REACTION: Myasthenia Gravis (MG) Medication Alert  . Procainamide Other (See Comments)    REACTION: Myasthenia Gravis (MG) Medication Alert  . Propranolol Other (See Comments)    REACTION: Myasthenia Gravis (MG) Medication Alert  . Quinidine Other (See Comments)    REACTION: Myasthenia Gravis (MG) Medication Alert  . Quinine Derivatives Other (See Comments)    REACTION: Myasthenia Gravis (MG) Medication Alert  . Streptomycin Other (See Comments)    REACTION:  Myasthenia Gravis (MG) Medication Alert  . Timolol Other (See Comments)    REACTION: Myasthenia Gravis (MG) Medication Alert  . Tobramycin     REACTION: Myasthenia Gravis (MG) Medication Alert  . Xeomin (Incobotulinumtoxina) Other (See Comments)      REACTION: Myasthenia Gravis (MG) Medication Alert  . Zithromax (Azithromycin) Other (See Comments)    REACTION: Myasthenia Gravis (MG) Medication Alert    Social History:  reports that he has never smoked. He has never used smokeless tobacco. He reports that he does not drink alcohol or use illicit drugs.  Family History: History reviewed. No pertinent family history.  Physical Exam: Filed Vitals:   06/07/12 1545 06/07/12 1548 06/07/12 1904 06/07/12 2117  BP:  137/77 107/54 116/57  Pulse:  109 87 80  Temp:  99.8 F (37.7 C)    TempSrc:  Oral    Resp:  20 16 20   Height: 6\' 1"  (1.854 m)     Weight: 103.874 kg (229 lb)     SpO2:  98% 94% 94%   General appearance: alert, cooperative and no distress Lungs: clear to auscultation bilaterally Heart: regular rate and rhythm, S1, S2 normal, no murmur, click, rub or gallop Abdomen: soft, non-tender; bowel sounds normal; no masses,  no organomegaly Extremities: extremities normal, atraumatic, no cyanosis or edema Pulses: 2+ and symmetric Skin: Skin color, texture, turgor normal. No rashes or lesions Neurologic: Grossly normal    Labs on Admission:  Basename 06/07/12 1705  NA 133*  K 3.7  CL 95*  CO2 27  GLUCOSE 158*  BUN 11  CREATININE 0.96  CALCIUM 10.1  MG --  PHOS --    Basename 06/07/12 1705  WBC 14.5*  NEUTROABS 12.4*  HGB 16.2  HCT 46.4  MCV 88.5  PLT 178   Radiological Exams on Admission: Dg Chest 2 View  06/07/2012  *RADIOLOGY REPORT*  Clinical Data: Diarrhea, UTI, myasthenia gravis  CHEST - 2 VIEW  Comparison: 08/06/2010  Findings: Lungs are clear. No pleural effusion or pneumothorax.  Cardiomediastinal silhouette is within normal limits.  Sternotomy wires.  Visualized osseous structures are within normal limits.  IMPRESSION: No evidence of acute cardiopulmonary disease.  Original Report Authenticated By: Charline Bills, M.D.    Assessment/Plan Present on Admission:  61 year old male with history  of myasthenia gravis and urinary tract infection  .UTI (lower urinary tract infection) .Myasthenia gravis .Hypertension .GERD (gastroesophageal reflux disease)  We'll place the patient overnight in observation. He appears nontoxic. He has had much improvement in his myasthenia gravis flare which includes ptosis of the eyes since she's been restarted on Mestinon. We'll continue data place him on Rocephin for his bladder infection and obtain urine culture. He is currently not having any respiratory symptoms or issues.  [name],[name] [phone] 06/07/2012, 10:09 PM

## 2012-06-07 NOTE — ED Notes (Signed)
Report called to [employer] [employer], bed 322

## 2012-06-07 NOTE — ED Provider Notes (Signed)
History     CSN: [id number]  Arrival date & time 06/07/12  1521   First MD Initiated Contact with Patient 06/07/12 1628      Chief Complaint  Patient presents with  . Diarrhea    (Consider location/radiation/quality/duration/timing/severity/associated sxs/prior treatment) Patient is a 61 y.o. male presenting with diarrhea. The history is provided by the patient.  Diarrhea The primary symptoms include diarrhea.  He was in his PCPs office this morning for a physical. When he had to give a urine sample, he noted some slight discomfort. When he got home from the doctor's office, he started having chills and could not get warm. He was outside in the heat and under a blanket. Then he got sweaty and had to go inside to get cool. He was not aware of running a fever. He did develop diarrhea, but he also was just started on pyridostigmine for his myasthenia gravis and diarrhea she side effect of that. He was started on Keflex for his urinary tract infection is taken one dose of both of those medications. He denies sore throat, cough, nausea, vomiting.  Past Medical History  Diagnosis Date  . GERD (gastroesophageal reflux disease)   . Esophageal stricture SEP 2011    FOOD IMPACTION-->SAV DIL 16 MM  . Myasthenia gravis   . Hypertension   . Thyroid disease   . Kidney stone     Past Surgical History  Procedure Date  . Upper gastrointestinal endoscopy SEP 2011    SAV DIL  . Kidney stone surgery     History reviewed. No pertinent family history.  History  Substance Use Topics  . Smoking status: Never Smoker   . Smokeless tobacco: Never Used  . Alcohol Use: No      Review of Systems  Gastrointestinal: Positive for diarrhea.  All other systems reviewed and are negative.    Allergies  Levofloxacin  Home Medications   Current Outpatient Rx  Name Route Sig Dispense Refill  . ACYCLOVIR 800 MG PO TABS Oral Take 800 mg by mouth daily.      . AUGMENTIN PO Oral Take 250 mg by  mouth daily.      [name] VITAMIN C 1000 MG PO TABS Oral Take 1,000 mg by mouth daily.      . AZATHIOPRINE 100 MG PO TABS Oral Take 100 mg by mouth daily.      [name] CALCIUM 500 +D PO Oral Take 1 tablet by mouth daily.      . OMEGA-3 FATTY ACIDS 1000 MG PO CAPS Oral Take 1 g by mouth daily.      [name] LEVOTHYROXINE SODIUM 112 MCG PO TABS Oral Take 112 mcg by mouth daily.      [name] OMEPRAZOLE 20 MG PO CPDR Oral Take 1 capsule (20 mg total) by mouth daily. 90 capsule 3  . SAW PALMETTO BERRIES PO Oral Take 1 tablet by mouth daily.      [name] VALSARTAN 80 MG PO TABS Oral Take 80 mg by mouth daily.      [name] VITAMIN E 400 UNITS PO CAPS Oral Take 400 Units by mouth daily.        BP 137/77  Pulse 109  Temp 99.8 F (37.7 C) (Oral)  Resp 20  Ht 6\' 1"  (1.854 m)  Wt 229 lb (103.874 kg)  BMI 30.21 kg/m2  SpO2 98%  Physical Exam  Nursing note and vitals reviewed.  61 year old male who is resting comfortably and in no acute distress. Vital signs are  significant for tachycardia with heart rate 109. Oxygen saturation is 98% which is normal. Head is normocephalic and atraumatic. PERRLA, EOMI. Neck is nontender and supple. Back is nontender. Lungs are clear without rales, wheezes, rhonchi. Heart has regular rate rhythm without murmur. Abdomen is soft, flat, nontender without masses or hepatosplenomegaly. There's no CVA tenderness. Extremities have no cyanosis or edema, full range of motion is present. Skin is warm and dry without rash. Neurologic: Mental status is normal, cranial nerves are intact, there are no motor or sensory deficits.  ED Course  Procedures (including critical care time)  Results for orders placed during the hospital encounter of 06/07/12  CBC WITH DIFFERENTIAL      Component Value Range   WBC 14.5 (*) 4.0 - 10.5 K/uL   RBC 5.24  4.22 - 5.81 MIL/uL   Hemoglobin 16.2  13.0 - 17.0 g/dL   HCT 56.2  13.0 - 86.5 %   MCV 88.5  78.0 - 100.0 fL   MCH 30.9  26.0 - 34.0 pg   MCHC 34.9  30.0 - 36.0 g/dL   RDW  78.4  69.6 - 29.5 %   Platelets 178  150 - 400 K/uL   Neutrophils Relative 86 (*) 43 - 77 %   Neutro Abs 12.4 (*) 1.7 - 7.7 K/uL   Lymphocytes Relative 5 (*) 12 - 46 %   Lymphs Abs 0.7  0.7 - 4.0 K/uL   Monocytes Relative 9  3 - 12 %   Monocytes Absolute 1.3 (*) 0.1 - 1.0 K/uL   Eosinophils Relative 0  0 - 5 %   Eosinophils Absolute 0.0  0.0 - 0.7 K/uL   Basophils Relative 0  0 - 1 %   Basophils Absolute 0.0  0.0 - 0.1 K/uL  BASIC METABOLIC PANEL      Component Value Range   Sodium 133 (*) 135 - 145 mEq/L   Potassium 3.7  3.5 - 5.1 mEq/L   Chloride 95 (*) 96 - 112 mEq/L   CO2 27  19 - 32 mEq/L   Glucose, Bld 158 (*) 70 - 99 mg/dL   BUN 11  6 - 23 mg/dL   Creatinine, Ser 2.84  0.50 - 1.35 mg/dL   Calcium 13.2  8.4 - 44.0 mg/dL   GFR calc non Af Amer 88 (*) >90 mL/min   GFR calc Af Amer >90  >90 mL/min  URINALYSIS, ROUTINE W REFLEX MICROSCOPIC      Component Value Range   Color, Urine AMBER (*) YELLOW   APPearance CLEAR  CLEAR   Specific Gravity, Urine <1.005 (*) 1.005 - 1.030   pH 6.0  5.0 - 8.0   Glucose, UA NEGATIVE  NEGATIVE mg/dL   Hgb urine dipstick TRACE (*) NEGATIVE   Bilirubin Urine NEGATIVE  NEGATIVE   Ketones, ur TRACE (*) NEGATIVE mg/dL   Protein, ur NEGATIVE  NEGATIVE mg/dL   Urobilinogen, UA 0.2  0.0 - 1.0 mg/dL   Nitrite NEGATIVE  NEGATIVE   Leukocytes, UA MODERATE (*) NEGATIVE  URINE MICROSCOPIC-ADD ON      Component Value Range   WBC, UA 21-50  <3 WBC/hpf   RBC / HPF 3-6  <3 RBC/hpf   Bacteria, UA FEW (*) RARE   Dg Chest 2 View  06/07/2012  *RADIOLOGY REPORT*  Clinical Data: Diarrhea, UTI, myasthenia gravis  CHEST - 2 VIEW  Comparison: 08/06/2010  Findings: Lungs are clear. No pleural effusion or pneumothorax.  Cardiomediastinal silhouette is within normal limits.  Sternotomy  wires.  Visualized osseous structures are within normal limits.  IMPRESSION: No evidence of acute cardiopulmonary disease.  Original Report Authenticated By: Charline Bills, M.D.       1. Urinary tract infection   2. Myasthenia gravis       MDM  Apparently urinary tract infection. Patient states that he was told his prostate is normal size, so it is unclear why he would have a urinary infection. Urinalysis and urine culture will be obtained and he'll be given a dose of Rocephin. CBC and basic metabolic panel will also be checked.  Urinalysis confirms presence of urinary tract infection. A BB she is moderately elevated. Because of underlying worsening myasthenia gravis, I feel that he should be watched inpatient in the financial a.m. Dr. Onalee Hua of [facility] who agrees to admit the patient under observation status.     Dione Booze, MD 06/07/12 2034

## 2012-06-07 NOTE — ED Notes (Signed)
Started keflex for UTI  Today, and taking med for myasthenia gravis, Having diarrhea.

## 2012-06-07 NOTE — ED Notes (Signed)
Family at bedside. 

## 2012-06-08 LAB — BASIC METABOLIC PANEL
BUN: 11 mg/dL (ref 6–23)
CO2: 27 mEq/L (ref 19–32)
Calcium: 9.1 mg/dL (ref 8.4–10.5)
Chloride: 99 mEq/L (ref 96–112)
Creatinine, Ser: 0.96 mg/dL (ref 0.50–1.35)
GFR calc Af Amer: >90 mL/min (ref >90)
GFR calc non Af Amer: 88 mL/min — ABNORMAL LOW (ref >90)
Glucose, Bld: 158 mg/dL — ABNORMAL HIGH (ref 70–99)
Potassium: 3.8 mEq/L (ref 3.5–5.1)
Sodium: 134 mEq/L — ABNORMAL LOW (ref 135–145)

## 2012-06-08 LAB — CBC
HCT: 42.8 % (ref 39.0–52.0)
Hemoglobin: 14.9 g/dL (ref 13.0–17.0)
MCH: 31.2 pg (ref 26.0–34.0)
MCHC: 34.8 g/dL (ref 30.0–36.0)
MCV: 89.7 fL (ref 78.0–100.0)
Platelets: 162 10*3/uL (ref 150–400)
RBC: 4.77 MIL/uL (ref 4.22–5.81)
RDW: 13.2 % (ref 11.5–15.5)
WBC: 13.8 10*3/uL — ABNORMAL HIGH (ref 4.0–10.5)

## 2012-06-08 MED ORDER — ACYCLOVIR 200 MG PO CAPS
400.0000 mg | ORAL_CAPSULE | Freq: Every day | ORAL | Status: DC
  Administered 2012-06-08: 400 mg via ORAL
  Filled 2012-06-08 (×3): qty 2

## 2012-06-08 NOTE — Discharge Summary (Signed)
Physician Discharge Summary  [name] ZOX:[id number] DOB: October 23, 1951 DOA: 06/07/2012  PCP: Cassell Smiles., MD  Admit date: 06/07/2012 Discharge date: 06/08/2012  Recommendations for Outpatient Follow-up:  1. Follow with primary care physician within a week or so.   Discharge Diagnoses:    1. Presumed urinary tract infection, symptoms improved. 2. Myasthenia gravis, stable. 3. Hypertension, controlled. 4. GERD, stable. 5. Obesity.  Discharge Condition: Stable.  Diet recommendation: Regular.  History of present illness:  This very pleasant 61 year old man presented to the hospital with symptoms of chills. He had seen his primary care physician for a annual physical whereupon he was found to have a UTI in the urinalysis. He was therefore prescribed Keflex. On the same day he started to have chills. He then presented to the emergency room.  Hospital Course:  He was admitted yesterday, given intravenous fluids and intravenous Rocephin. He has done extremely well overnight and has had no further chills. He has had no fever in the hospital, does not have a resting tachycardia and his blood pressure is hemodynamically stable. He does not have any abdominal pain, nausea or vomiting. He is tolerating a diet. He is keen to go home.  Procedures:  None.  Consultations:  None.  Discharge Exam: Filed Vitals:   06/08/12 0541  BP: 107/64  Pulse: 71  Temp: 98.3 F (36.8 C)  Resp: 18   Filed Vitals:   06/07/12 1904 06/07/12 2117 06/07/12 2218 06/08/12 0541  BP: 107/54 116/57 116/65 107/64  Pulse: 87 80 79 71  Temp:   98.2 F (36.8 C) 98.3 F (36.8 C)  TempSrc:   Oral Oral  Resp: 16 20 18 18   Height:   6\' 1"  (1.854 m)   Weight:   102.6 kg (226 lb 3.1 oz)   SpO2: 94% 94% 96% 96%   General: Looks systemically well. He is not toxic or septic. Cardiovascular: Heart sounds are present and normal. No murmurs. No evidence of heart failure. Respiratory: Lung fields are  clear. Abdomen is soft and nontender. He is alert and orientated without any focal neurologic signs.  Discharge Instructions  Discharge Orders    Future Orders Please Complete By Expires   Diet - low sodium heart healthy      Increase activity slowly        Medication List  As of 06/08/2012 10:16 AM   STOP taking these medications         levothyroxine 137 MCG tablet         TAKE these medications         acyclovir 400 MG tablet   Commonly known as: ZOVIRAX   Take 400 mg by mouth daily.      azathioprine 100 MG tablet   Commonly known as: IMURAN   Take 150 mg by mouth daily.      CALCIUM 500 +D PO   Take 1 tablet by mouth daily.      cephALEXin 500 MG capsule   Commonly known as: KEFLEX   Take 500 mg by mouth 4 (four) times daily. For 14 days      fish oil-omega-3 fatty acids 1000 MG capsule   Take 1 g by mouth daily.      omeprazole 20 MG capsule   Commonly known as: PRILOSEC   Take 1 capsule (20 mg total) by mouth daily.      pyridostigmine 60 MG tablet   Commonly known as: MESTINON   Take 30 mg by mouth 3 (three)  times daily.      SAW PALMETTO BERRIES PO   Take 1 tablet by mouth daily.      valsartan 80 MG tablet   Commonly known as: DIOVAN   Take 80 mg by mouth daily.      vitamin C 1000 MG tablet   Take 1,000 mg by mouth daily.      vitamin E 400 UNIT capsule   Take 400 Units by mouth daily.              The results of significant diagnostics from this hospitalization (including imaging, microbiology, ancillary and laboratory) are listed below for reference.    Significant Diagnostic Studies: Dg Chest 2 View  06/07/2012  *RADIOLOGY REPORT*  Clinical Data: Diarrhea, UTI, myasthenia gravis  CHEST - 2 VIEW  Comparison: 08/06/2010  Findings: Lungs are clear. No pleural effusion or pneumothorax.  Cardiomediastinal silhouette is within normal limits.  Sternotomy wires.  Visualized osseous structures are within normal limits.  IMPRESSION: No evidence  of acute cardiopulmonary disease.  Original Report Authenticated By: Charline Bills, M.D.        Labs: Basic Metabolic Panel:  Lab 06/08/12 4098 06/07/12 1705  NA 134* 133*  K 3.8 3.7  CL 99 95*  CO2 27 27  GLUCOSE 158* 158*  BUN 11 11  CREATININE 0.96 0.96  CALCIUM 9.1 10.1  MG -- --  PHOS -- --       CBC:  Lab 06/08/12 0703 06/07/12 1705  WBC 13.8* 14.5*  NEUTROABS -- 12.4*  HGB 14.9 16.2  HCT 42.8 46.4  MCV 89.7 88.5  PLT 162 178      Time coordinating discharge: Greater than 30 minutes.  SignedWilson Singer  [facility] 06/08/2012, 10:16 AM

## 2012-06-08 NOTE — Progress Notes (Signed)
IV removed, site WNL.  Pt given d/c instructions and discussed home care with UTI.  Discussed home medications, patient verbalizes understanding, teachback completed. F/U appointment to be made by patient on Monday when office opens, pt states they will make and keep appointment. Pt is stable at this time. Pt taken to main entrance with staff member and family members, refused wheelchair, ambulated out with steady gait.

## 2012-06-11 LAB — URINE CULTURE: Colony Count: 40000

## 2012-09-19 ENCOUNTER — Other Ambulatory Visit: Payer: Self-pay

## 2012-09-19 DIAGNOSIS — K219 Gastro-esophageal reflux disease without esophagitis: Secondary | ICD-10-CM

## 2012-09-20 MED ORDER — OMEPRAZOLE 20 MG PO CPDR: 20.0000 mg | DELAYED_RELEASE_CAPSULE | Freq: Every day | ORAL | Status: DC

## 2012-09-23 DIAGNOSIS — K219 Gastro-esophageal reflux disease without esophagitis: Secondary | ICD-10-CM

## 2012-09-23 NOTE — Telephone Encounter (Signed)
Open in error

## 2012-10-08 ENCOUNTER — Telehealth: Payer: Self-pay | Admitting: General Practice

## 2012-10-08 DIAGNOSIS — K219 Gastro-esophageal reflux disease without esophagitis: Secondary | ICD-10-CM

## 2012-10-08 MED ORDER — OMEPRAZOLE 20 MG PO CPDR: 20.0000 mg | DELAYED_RELEASE_CAPSULE | Freq: Every day | ORAL | Status: DC

## 2012-10-08 NOTE — Telephone Encounter (Signed)
Please use number to call Rx in.

## 2012-10-08 NOTE — Telephone Encounter (Signed)
Please fax Rx to number below. Also, pt needs OV FU prior to further refills, Please schedule. Thanks

## 2012-10-08 NOTE — Telephone Encounter (Signed)
I received a call from [place] pt is requesting a refill on his Omeprazole 20mg . Insurance company would like called in to mail order for a 90 day supply.  Please advise?   [phone] option 3 [phone]

## 2012-10-08 NOTE — Telephone Encounter (Signed)
Called Rx to [facility] at the number provided. Omeprazole 20 mg #90    One daily and one refill.

## 2013-05-29 ENCOUNTER — Other Ambulatory Visit: Payer: Self-pay

## 2013-05-29 DIAGNOSIS — K219 Gastro-esophageal reflux disease without esophagitis: Secondary | ICD-10-CM

## 2013-05-29 MED ORDER — OMEPRAZOLE 20 MG PO CPDR: 20.0000 mg | DELAYED_RELEASE_CAPSULE | Freq: Every day | ORAL | Status: DC

## 2013-05-29 NOTE — Telephone Encounter (Signed)
Darl Pikes can you make him an appointment

## 2013-05-29 NOTE — Telephone Encounter (Signed)
I refilled PPI for one month. Needs OV prior to further refills.

## 2013-05-29 NOTE — Telephone Encounter (Signed)
The prescription printed, and per AS I just faxed to the pharmacy at [phone].

## 2013-05-30 NOTE — Telephone Encounter (Signed)
Pt has OV on 7/24 at 3 with SF

## 2013-06-12 ENCOUNTER — Ambulatory Visit: Payer: Managed Care, Other (non HMO) | Admitting: Gastroenterology

## 2013-07-09 ENCOUNTER — Telehealth: Payer: Self-pay

## 2013-07-09 ENCOUNTER — Encounter: Payer: Self-pay | Admitting: Gastroenterology

## 2013-07-09 ENCOUNTER — Ambulatory Visit (INDEPENDENT_AMBULATORY_CARE_PROVIDER_SITE_OTHER): Payer: Managed Care, Other (non HMO) | Admitting: Gastroenterology

## 2013-07-09 VITALS — BP 137/83 | HR 70 | Temp 98.1°F | Ht 73.0 in | Wt 226.6 lb

## 2013-07-09 DIAGNOSIS — K219 Gastro-esophageal reflux disease without esophagitis: Secondary | ICD-10-CM

## 2013-07-09 DIAGNOSIS — K222 Esophageal obstruction: Secondary | ICD-10-CM

## 2013-07-09 MED ORDER — OMEPRAZOLE 20 MG PO CPDR: DELAYED_RELEASE_CAPSULE | ORAL | Status: DC

## 2013-07-09 NOTE — Assessment & Plan Note (Signed)
DYSPHAGIA WITH TRANSIENT FOOD IMPACTION.  EGD SEP 5 OR 19. PT WILL CALL FOR APPT. OPV IN DEC 2014.

## 2013-07-09 NOTE — Telephone Encounter (Signed)
Cheryl left Vm to call and schedule pt's procedure for 07/25/2013 as Dr. Darrick Penna had recommended. ( please see OV note).

## 2013-07-09 NOTE — Progress Notes (Signed)
Subjective:    Patient ID: [name], male    DOB: 11/27/1950, 61 y.o.   MRN: [id number]  FUSCO,LAWRENCE J., MD  HPI Swallowing got better SPE 2011 AFTER DILATION TO 16 MM. NOW SWALLOWING DIFFICULT WITH SOLIDS ONLY. LIQUIDS ARE NEVER A PROBLEM. HAD A FODD IMPACTION 1.5 MOS AGO. HEARTBURN FAIRLY WELL CONTROLLED. CAN'T HAVE MANY MEDS.  PT DENIES FEVER, CHILLS, BRBPR, nausea, vomiting, melena, diarrhea, constipation, abd pain,  problems with sedation, heartburn or indigestion.  Past Medical History  Diagnosis Date  . GERD (gastroesophageal reflux disease)   . Esophageal stricture SEP 2011    FOOD IMPACTION-->SAV DIL 16 MM  . Myasthenia gravis   . Hypertension   . Thyroid disease   . Kidney stone    Past Surgical History  Procedure Laterality Date  . Upper gastrointestinal endoscopy  SEP 2011    SAV DIL  . Kidney stone surgery     Allergies  Allergen Reactions  . Levofloxacin Anaphylaxis  . Tequin [Gatifloxacin] Anaphylaxis  . Aminoglycosides Other (See Comments)    REACTION: Myasthenia Gravis (MG) Medication Alert  . Avelox [Moxifloxacin Hcl In Nacl] Other (See Comments)    REACTION: FLUOROQUINOLONE ANTIBIOTICS: Myasthenia Gravis (MG) Medication Alert  . Botox [Onabotulinumtoxina] Other (See Comments)    REACTION: Myasthenia Gravis (MG) Medication Alert  . Botulinum Toxins Other (See Comments)    REACTION: Myasthenia Gravis (MG) Medication Alert  . Calcium Channel Blockers Other (See Comments)    (BLOOD PRESSURE MEDICATION) REACTION: Myasthenia Gravis (MG) Medication Alert  . Ciprofloxacin Other (See Comments)    REACTION: FLUOROQUINOLONE ANTIBIOTICS: Myasthenia Gravis (MG) Medication Alert  . Curare [Tubocurarine] Other (See Comments)    (Usually only used during surgery)  REACTION: Myasthenia Gravis (MG) Medication Alert  . Dysport [Abobotulinumtoxina] Other (See Comments)    REACTION: Myasthenia Gravis (MG) Medication Alert  . Erythromycin Other (See Comments)     REACTION: Myasthenia Gravis (MG) Medication Alert  . Factive [Gemifloxacin] Other (See Comments)    REACTION: FLUOROQUINOLONE ANTIBIOTICS: Myasthenia Gravis (MG) Medication Alert  . Floxin [Ofloxacin] Other (See Comments)    REACTION: FLUOROQUINOLONE ANTIBIOTICS: Myasthenia Gravis (MG) Medication Alert  . Gentamycin [Gentamicin] Other (See Comments)    REACTION:Myasthenia Gravis (MG) Medication Alert  . Interferons Other (See Comments)    REACTION: Myasthenia Gravis (MG) Medication Alert  . Kanamycin Other (See Comments)    REACTION: Myasthenia Gravis (MG) Medication Alert  . Ketek [Telithromycin] Other (See Comments)    REACTION: Myasthenia Gravis (MG) Medication Alert  . Levaquin [Levofloxacin In D5w] Other (See Comments)    REACTION: FLUOROQUINOLONE ANTIBIOTICS: Myasthenia Gravis (MG) Medication Alert  . Macrolides And Ketolides Other (See Comments)    REACTION: Myasthenia Gravis (MG) Medication Alert  . Myobloc [Rimabotulinumtoxinb] Other (See Comments)    REACTION: Myasthenia Gravis (MG) Medication Alert  . Neomycin Other (See Comments)    REACTION: Myasthenia Gravis (MG) Medication Alert  . Noroxin [Norfloxacin] Other (See Comments)    REACTION: FLUOROQUINOLONE ANTIBIOTICS: Myasthenia Gravis (MG) Medication Alert  . Other Other (See Comments)    MAGNESIUM SALTS: Milk of Magnesia, some antacids (Maalox, Mylanta) REACTION: Myasthenia Gravis (MG) Medication Alerta  . Penicillamine Other (See Comments)    D-PENICILLAMINE *DO NOT CONFUSE WITH PENICILLIN*  REACTION: Myasthenia Gravis (MG) Medication Alert  . Procainamide Other (See Comments)    REACTION: Myasthenia Gravis (MG) Medication Alert  . Propranolol Other (See Comments)    REACTION: Myasthenia Gravis (MG) Medication Alert  . Quinidine Other (See Comments)      REACTION: Myasthenia Gravis (MG) Medication Alert  . Quinine Derivatives Other (See Comments)    REACTION: Myasthenia Gravis (MG) Medication Alert  .  Streptomycin Other (See Comments)    REACTION:  Myasthenia Gravis (MG) Medication Alert  . Timolol Other (See Comments)    REACTION: Myasthenia Gravis (MG) Medication Alert  . Tobramycin     REACTION: Myasthenia Gravis (MG) Medication Alert  . Xeomin [Incobotulinumtoxina] Other (See Comments)    REACTION: Myasthenia Gravis (MG) Medication Alert  . Zithromax [Azithromycin] Other (See Comments)    REACTION: Myasthenia Gravis (MG) Medication Alert   Current Outpatient Prescriptions on File Prior to Visit  Medication Sig Dispense Refill  . acyclovir (ZOVIRAX) 400 MG tablet Take 400 mg by mouth daily.      . Ascorbic Acid (VITAMIN C) 1000 MG tablet Take 1,000 mg by mouth daily.        . azathioprine (IMURAN) 100 MG tablet Take 150 mg by mouth daily.       . Calcium Carb-Cholecalciferol (CALCIUM 500 +D PO) Take 1 tablet by mouth daily.        . fish oil-omega-3 fatty acids 1000 MG capsule Take 1 g by mouth daily.      . omeprazole (PRILOSEC) 20 MG capsule Take 1 capsule (20 mg total) by mouth daily.    . Saw Palmetto, Serenoa repens, (SAW PALMETTO BERRIES PO) Take 1 tablet by mouth daily.        . valsartan (DIOVAN) 80 MG tablet Take 80 mg by mouth daily.        . vitamin E 400 UNIT capsule Take 400 Units by mouth daily.        . cephALEXin (KEFLEX) 500 MG capsule Take 500 mg by mouth 4 (four) times daily. For 14 days      . pyridostigmine (MESTINON) 60 MG tablet Take 30 mg by mouth 3 (three) times daily.         Review of Systems     Objective:   Physical Exam  Vitals reviewed. Constitutional: He is oriented to person, place, and time. He appears well-nourished. No distress.  HENT:  Head: Normocephalic and atraumatic.  Mouth/Throat: Oropharynx is clear and moist. No oropharyngeal exudate.  Eyes: Pupils are equal, round, and reactive to light. No scleral icterus.  Neck: Normal range of motion. Neck supple.  Cardiovascular: Normal rate, regular rhythm and normal heart sounds.    Pulmonary/Chest: Effort normal and breath sounds normal. No respiratory distress.  Abdominal: Soft. Bowel sounds are normal. He exhibits no distension. There is no tenderness.  Musculoskeletal: He exhibits no edema.  Lymphadenopathy:    He has no cervical adenopathy.  Neurological: He is alert and oriented to person, place, and time.  NO  NEW FOCAL DEFICITS   Psychiatric: He has a normal mood and affect.          Assessment & Plan:   

## 2013-07-09 NOTE — Patient Instructions (Signed)
Use Prilosec 30 minutes prior to your first meal.  UPPER ENDOSCOPY WITH DILATION ON Friday SEP 5 OR 19.  FOLLOW A SOFT MECHANICAL DIET. SEE INFO BELOW.  MEATS SHOULD BE CHOPPED OR GROUND ONLY.  FOLLOW UP IN DEC 2014.  SOFT MECHANICAL DIET This SOFT MECHANICAL DIET is restricted to:  Foods that are moist, soft-textured, and easy to chew and swallow.   Meats that are ground or are minced no larger than one-quarter inch pieces. Meats are moist with gravy or sauce added.   Foods that do not include bread or bread-like textures except soft pancakes, well-moistened with syrup or sauce.   Textures with some chewing ability required.   Casseroles without rice.   Cooked vegetables that are less than half an inch in size and easily mashed with a fork. No cooked corn, peas, broccoli, cauliflower, cabbage, Brussels sprouts, asparagus, or other fibrous, non-tender or rubbery cooked vegetables.   Canned fruit except for pineapple. Fruit must be cut into pieces no larger than half an inch in size.   Foods that do not include nuts, seeds, coconut, or sticky textures.   FOOD TEXTURES FOR DYSPHAGIA DIET LEVEL 2 -SOFT MECHANICAL DIET (includes all foods on Dysphagia Diet Level 1 - Pureed, in addition to the foods listed below)  FOOD GROUP: Breads. RECOMMENDED: Soft pancakes, well-moistened with syrup or sauce.  AVOID: All others.  FOOD GROUP: Cereals.  RECOMMENDED: Cooked cereals with little texture, including oatmeal. Unprocessed wheat bran stirred into cereals for bulk. Note: If thin liquids are restricted, it is important that all of the liquid is absorbed into the cereal.  AVOID: All dry cereals and any cooked cereals that may contain flax seeds or other seeds or nuts. Whole-grain, dry, or coarse cereals. Cereals with nuts, seeds, dried fruit, and/or coconut.  FOOD GROUP: Desserts. RECOMMENDED: Pudding, custard. Soft fruit pies with bottom crust only. Canned fruit (excluding pineapple).  Soft, moist cakes with icing.Frozen malts, milk shakes, frozen yogurt, eggnog, nutritional supplements, ice cream, sherbet, regular or sugar-free gelatin, or any foods that become thin liquid at either room (70 F) or body temperature (98 F).  AVOID: Dry, coarse cakes and cookies. Anything with nuts, seeds, coconut, pineapple, or dried fruit. Breakfast yogurt with nuts. Rice or bread pudding.  FOOD GROUP: Fats. RECOMMENDED: Butter, margarine, cream for cereal (depending on liquid consistency recommendations), gravy, cream sauces, sour cream, sour cream dips with soft additives, mayonnaise, salad dressings, cream cheese, cream cheese spreads with soft additives, whipped toppings.  AVOID: All fats with coarse or chunky additives.  FOOD GROUP: Fruits. RECOMMENDED: Soft drained, canned, or cooked fruits without seeds or skin. Fresh soft and ripe banana. Fruit juices with a small amount of pulp. If thin liquids are restricted, fruit juices should be thickened to appropriate consistency.  AVOID: Fresh or frozen fruits. Cooked fruit with skin or seeds. Dried fruits. Fresh, canned, or cooked pineapple.  FOOD GROUP: Meats and Meat Substitutes. (Meat pieces should not exceed 1/4 of an inch cube and should be tender.) RECOMMENDED: Moistened ground or cooked meat, poultry, or fish. Moist ground or tender meat may be served with gravy or sauce. Casseroles without rice. Moist macaroni and cheese, well-cooked pasta with meat sauce, tuna noodle casserole, soft, moist lasagna. Moist meatballs, meatloaf, or fish loaf. Protein salads, such as tuna or egg without large chunks, celery, or onion. Cottage cheese, smooth quiche without large chunks. Poached, scrambled, or soft-cooked eggs (egg yolks should not be "runny" but should be moist and able  to be mashed with butter, margarine, or other moisture added to them). (Cook eggs to 160 F or use pasteurized eggs for safety.) Souffls may have small, soft chunks. Tofu.  Well-cooked, slightly mashed, moist legumes, such as baked beans. All meats or protein substitutes should be served with sauces or moistened to help maintain cohesiveness in the oral cavity.  AVOID: Dry meats, tough meats (such as bacon, sausage, hot dogs, bratwurst). Dry casseroles or casseroles with rice or large chunks. Peanut butter. Cheese slices and cubes. Hard-cooked or crisp fried eggs. Sandwiches.Pizza.  FOOD GROUP: Potatoes and Starches. RECOMMENDED: Well-cooked, moistened, boiled, baked, or mashed potatoes. Well-cooked shredded hash brown potatoes that are not crisp. (All potatoes need to be moist and in sauces.)Well-cooked noodles in sauce. Spaetzel or soft dumplings that have been moistened with butter or gravy.  AVOID: Potato skins and chips. Fried or French-fried potatoes. Rice.  FOOD GROUP: Soups. RECOMMENDED: Soups with easy-to-chew or easy-to-swallow meats or vegetables: Particle sizes in soups should be less than 1/2 inch. Soups will need to be thickened to appropriate consistency if soup is thinner than prescribed liquid consistency.  AVOID: Soups with large chunks of meat and vegetables. Soups with rice, corn, peas.  FOOD GROUP: Vegetables. RECOMMENDED: All soft, well-cooked vegetables. Vegetables should be less than a half inch. Should be easily mashed with a fork.  AVOID: Cooked corn and peas. Broccoli, cabbage, Brussels sprouts, asparagus, or other fibrous, non-tender or rubbery cooked vegetables.  FOOD GROUP: Miscellaneous. RECOMMENDED: Jams and preserves without seeds, jelly. Sauces, salsas, etc., that may have small tender chunks less than 1/2 inch. Soft, smooth chocolate bars that are easily chewed.  AVOID: Seeds, nuts, coconut, or sticky foods. Chewy candies such as caramels or licorice.

## 2013-07-10 ENCOUNTER — Other Ambulatory Visit: Payer: Self-pay | Admitting: Gastroenterology

## 2013-07-10 DIAGNOSIS — K219 Gastro-esophageal reflux disease without esophagitis: Secondary | ICD-10-CM

## 2013-07-10 DIAGNOSIS — K222 Esophageal obstruction: Secondary | ICD-10-CM

## 2013-07-10 NOTE — Telephone Encounter (Signed)
Patient is scheduled with [facility] on Friday Sept 5th and I have spoken to his wife Elnita Maxwell as well as mailed instructions

## 2013-07-10 NOTE — Progress Notes (Signed)
cc'd to pcp 

## 2013-07-11 NOTE — Progress Notes (Signed)
Reminder appt made 

## 2013-07-14 ENCOUNTER — Encounter (HOSPITAL_COMMUNITY): Payer: Self-pay | Admitting: Pharmacy Technician

## 2013-07-25 ENCOUNTER — Ambulatory Visit (HOSPITAL_COMMUNITY)
Admission: RE | Admit: 2013-07-25 | Discharge: 2013-07-25 | Disposition: A | Discharge status: Home / Self Care | Payer: Managed Care, Other (non HMO) | Source: Ambulatory Visit | Attending: Gastroenterology | Admitting: Gastroenterology

## 2013-07-25 ENCOUNTER — Encounter (HOSPITAL_COMMUNITY): Payer: Self-pay | Admitting: *Deleted

## 2013-07-25 ENCOUNTER — Encounter (HOSPITAL_COMMUNITY)
Admission: RE | Disposition: A | Discharge status: Home / Self Care | Payer: Self-pay | Source: Ambulatory Visit | Attending: Gastroenterology

## 2013-07-25 DIAGNOSIS — Q391 Atresia of esophagus with tracheo-esophageal fistula: Secondary | ICD-10-CM | POA: Insufficient documentation

## 2013-07-25 DIAGNOSIS — K222 Esophageal obstruction: Secondary | ICD-10-CM | POA: Insufficient documentation

## 2013-07-25 DIAGNOSIS — I10 Essential (primary) hypertension: Secondary | ICD-10-CM | POA: Insufficient documentation

## 2013-07-25 DIAGNOSIS — K2990 Gastroduodenitis, unspecified, without bleeding: Secondary | ICD-10-CM

## 2013-07-25 DIAGNOSIS — R1310 Dysphagia, unspecified: Secondary | ICD-10-CM

## 2013-07-25 DIAGNOSIS — K2960 Other gastritis without bleeding: Secondary | ICD-10-CM | POA: Insufficient documentation

## 2013-07-25 DIAGNOSIS — K259 Gastric ulcer, unspecified as acute or chronic, without hemorrhage or perforation: Secondary | ICD-10-CM

## 2013-07-25 DIAGNOSIS — K2970 Gastritis, unspecified, without bleeding: Secondary | ICD-10-CM

## 2013-07-25 DIAGNOSIS — K219 Gastro-esophageal reflux disease without esophagitis: Secondary | ICD-10-CM

## 2013-07-25 HISTORY — PX: ESOPHAGOGASTRODUODENOSCOPY: SHX5428

## 2013-07-25 SURGERY — EGD (ESOPHAGOGASTRODUODENOSCOPY)
Anesthesia: Moderate Sedation

## 2013-07-25 MED ORDER — MIDAZOLAM HCL 5 MG/5ML IJ SOLN
INTRAMUSCULAR | Status: DC | PRN
  Administered 2013-07-25: 2 mg via INTRAVENOUS
  Administered 2013-07-25: 1 mg via INTRAVENOUS
  Administered 2013-07-25: 2 mg via INTRAVENOUS
  Administered 2013-07-25: 1 mg via INTRAVENOUS

## 2013-07-25 MED ORDER — STERILE WATER FOR IRRIGATION IR SOLN
Status: DC | PRN
  Administered 2013-07-25: 12:00:00

## 2013-07-25 MED ORDER — MEPERIDINE HCL 100 MG/ML IJ SOLN
INTRAMUSCULAR | Status: DC | PRN
  Administered 2013-07-25 (×3): 25 mg via INTRAVENOUS

## 2013-07-25 MED ORDER — MIDAZOLAM HCL 5 MG/5ML IJ SOLN
INTRAMUSCULAR | Status: AC
  Filled 2013-07-25: qty 10

## 2013-07-25 MED ORDER — SODIUM CHLORIDE 0.9 % IV SOLN
INTRAVENOUS | Status: DC
  Administered 2013-07-25: 11:00:00 via INTRAVENOUS

## 2013-07-25 MED ORDER — MEPERIDINE HCL 100 MG/ML IJ SOLN
INTRAMUSCULAR | Status: AC
  Filled 2013-07-25: qty 2

## 2013-07-25 MED ORDER — BUTAMBEN-TETRACAINE-BENZOCAINE 2-2-14 % EX AERO
INHALATION_SPRAY | CUTANEOUS | Status: DC | PRN
  Administered 2013-07-25: 2 via TOPICAL

## 2013-07-25 MED ORDER — MINERAL OIL PO OIL
TOPICAL_OIL | ORAL | Status: AC
  Filled 2013-07-25: qty 30

## 2013-07-25 NOTE — Interval H&P Note (Signed)
History and Physical Interval Note:  07/25/2013 11:53 AM  [name]  has presented today for surgery, with the diagnosis of Esophageal Stricture and GERD  The various methods of treatment have been discussed with the patient and family. After consideration of risks, benefits and other options for treatment, the patient has consented to  Procedure(s) with comments: ESOPHAGOGASTRODUODENOSCOPY (EGD) (N/A) - 12:30-moved to 12:00 Melanie notified pt as a surgical intervention .  The patient's history has been reviewed, patient examined, no change in status, stable for surgery.  I have reviewed the patient's chart and labs.  Questions were answered to the patient's satisfaction.     [employer]

## 2013-07-25 NOTE — H&P (View-Only) (Signed)
Subjective:    Patient ID: [name], male    DOB: 04/20/1951, 62 y.o.   MRN: [id number]  Cassell Smiles., MD  HPI Swallowing got better SPE 2011 AFTER DILATION TO 16 MM. NOW SWALLOWING DIFFICULT WITH SOLIDS ONLY. LIQUIDS ARE NEVER A PROBLEM. HAD A FODD IMPACTION 1.5 MOS AGO. HEARTBURN FAIRLY WELL CONTROLLED. CAN'T HAVE MANY MEDS.  PT DENIES FEVER, CHILLS, BRBPR, nausea, vomiting, melena, diarrhea, constipation, abd pain,  problems with sedation, heartburn or indigestion.  Past Medical History  Diagnosis Date  . GERD (gastroesophageal reflux disease)   . Esophageal stricture SEP 2011    FOOD IMPACTION-->SAV DIL 16 MM  . Myasthenia gravis   . Hypertension   . Thyroid disease   . Kidney stone    Past Surgical History  Procedure Laterality Date  . Upper gastrointestinal endoscopy  SEP 2011    SAV DIL  . Kidney stone surgery     Allergies  Allergen Reactions  . Levofloxacin Anaphylaxis  . Tequin [Gatifloxacin] Anaphylaxis  . Aminoglycosides Other (See Comments)    REACTION: Myasthenia Gravis (MG) Medication Alert  . Avelox [Moxifloxacin Hcl In Nacl] Other (See Comments)    REACTION: FLUOROQUINOLONE ANTIBIOTICS: Myasthenia Gravis (MG) Medication Alert  . Botox [Onabotulinumtoxina] Other (See Comments)    REACTION: Myasthenia Gravis (MG) Medication Alert  . Botulinum Toxins Other (See Comments)    REACTION: Myasthenia Gravis (MG) Medication Alert  . Calcium Channel Blockers Other (See Comments)    (BLOOD PRESSURE MEDICATION) REACTION: Myasthenia Gravis (MG) Medication Alert  . Ciprofloxacin Other (See Comments)    REACTION: FLUOROQUINOLONE ANTIBIOTICS: Myasthenia Gravis (MG) Medication Alert  . Curare [Tubocurarine] Other (See Comments)    (Usually only used during surgery)  REACTION: Myasthenia Gravis (MG) Medication Alert  . Dysport [Abobotulinumtoxina] Other (See Comments)    REACTION: Myasthenia Gravis (MG) Medication Alert  . Erythromycin Other (See Comments)     REACTION: Myasthenia Gravis (MG) Medication Alert  . Factive [Gemifloxacin] Other (See Comments)    REACTION: FLUOROQUINOLONE ANTIBIOTICS: Myasthenia Gravis (MG) Medication Alert  . Floxin [Ofloxacin] Other (See Comments)    REACTION: FLUOROQUINOLONE ANTIBIOTICS: Myasthenia Gravis (MG) Medication Alert  . Gentamycin [Gentamicin] Other (See Comments)    REACTION:Myasthenia Gravis (MG) Medication Alert  . Interferons Other (See Comments)    REACTION: Myasthenia Gravis (MG) Medication Alert  . Kanamycin Other (See Comments)    REACTION: Myasthenia Gravis (MG) Medication Alert  . Ketek [Telithromycin] Other (See Comments)    REACTION: Myasthenia Gravis (MG) Medication Alert  . Levaquin [Levofloxacin In D5w] Other (See Comments)    REACTION: FLUOROQUINOLONE ANTIBIOTICS: Myasthenia Gravis (MG) Medication Alert  . Macrolides And Ketolides Other (See Comments)    REACTION: Myasthenia Gravis (MG) Medication Alert  . Myobloc [Rimabotulinumtoxinb] Other (See Comments)    REACTION: Myasthenia Gravis (MG) Medication Alert  . Neomycin Other (See Comments)    REACTION: Myasthenia Gravis (MG) Medication Alert  . Noroxin [Norfloxacin] Other (See Comments)    REACTION: FLUOROQUINOLONE ANTIBIOTICS: Myasthenia Gravis (MG) Medication Alert  . Other Other (See Comments)    MAGNESIUM SALTS: Milk of Magnesia, some antacids (Maalox, Mylanta) REACTION: Myasthenia Gravis (MG) Medication Alerta  . Penicillamine Other (See Comments)    D-PENICILLAMINE *DO NOT CONFUSE WITH PENICILLIN*  REACTION: Myasthenia Gravis (MG) Medication Alert  . Procainamide Other (See Comments)    REACTION: Myasthenia Gravis (MG) Medication Alert  . Propranolol Other (See Comments)    REACTION: Myasthenia Gravis (MG) Medication Alert  . Quinidine Other (See Comments)  REACTION: Myasthenia Gravis (MG) Medication Alert  . Quinine Derivatives Other (See Comments)    REACTION: Myasthenia Gravis (MG) Medication Alert  .  Streptomycin Other (See Comments)    REACTION:  Myasthenia Gravis (MG) Medication Alert  . Timolol Other (See Comments)    REACTION: Myasthenia Gravis (MG) Medication Alert  . Tobramycin     REACTION: Myasthenia Gravis (MG) Medication Alert  . Xeomin [Incobotulinumtoxina] Other (See Comments)    REACTION: Myasthenia Gravis (MG) Medication Alert  . Zithromax [Azithromycin] Other (See Comments)    REACTION: Myasthenia Gravis (MG) Medication Alert   Current Outpatient Prescriptions on File Prior to Visit  Medication Sig Dispense Refill  . acyclovir (ZOVIRAX) 400 MG tablet Take 400 mg by mouth daily.      . Ascorbic Acid (VITAMIN C) 1000 MG tablet Take 1,000 mg by mouth daily.        [name] azathioprine (IMURAN) 100 MG tablet Take 150 mg by mouth daily.       . Calcium Carb-Cholecalciferol (CALCIUM 500 +D PO) Take 1 tablet by mouth daily.        . fish oil-omega-3 fatty acids 1000 MG capsule Take 1 g by mouth daily.      [name] omeprazole (PRILOSEC) 20 MG capsule Take 1 capsule (20 mg total) by mouth daily.    . Saw Palmetto, Serenoa repens, (SAW PALMETTO BERRIES PO) Take 1 tablet by mouth daily.        . valsartan (DIOVAN) 80 MG tablet Take 80 mg by mouth daily.        . vitamin E 400 UNIT capsule Take 400 Units by mouth daily.        . cephALEXin (KEFLEX) 500 MG capsule Take 500 mg by mouth 4 (four) times daily. For 14 days      . pyridostigmine (MESTINON) 60 MG tablet Take 30 mg by mouth 3 (three) times daily.         Review of Systems     Objective:   Physical Exam  Vitals reviewed. Constitutional: He is oriented to person, place, and time. He appears well-nourished. No distress.  HENT:  Head: Normocephalic and atraumatic.  Mouth/Throat: Oropharynx is clear and moist. No oropharyngeal exudate.  Eyes: Pupils are equal, round, and reactive to light. No scleral icterus.  Neck: Normal range of motion. Neck supple.  Cardiovascular: Normal rate, regular rhythm and normal heart sounds.    Pulmonary/Chest: Effort normal and breath sounds normal. No respiratory distress.  Abdominal: Soft. Bowel sounds are normal. He exhibits no distension. There is no tenderness.  Musculoskeletal: He exhibits no edema.  Lymphadenopathy:    He has no cervical adenopathy.  Neurological: He is alert and oriented to person, place, and time.  NO  NEW FOCAL DEFICITS   Psychiatric: He has a normal mood and affect.          Assessment & Plan:

## 2013-07-26 NOTE — Op Note (Signed)
[facility] [address] [place], [address]   ENDOSCOPY PROCEDURE REPORT  PATIENT: [name], [name]  MR#: [id number] BIRTHDATE: 08-22-1951 , 61  yrs. old GENDER: Male  ENDOSCOPIST: Jonette Eva, MD REFFERED [id number] Sherwood Gambler, M.D.  PROCEDURE DATE:  07/25/2013 PROCEDURE:   EGD with biopsy and EGD with dilatation over guidewire  INDICATIONS:1.  dysphagia. PMHx: FOOD IMPACTION/EGD/DIL(16 MM) SEP 2011 MEDICATIONS: Demerol 75 mg IV and Versed 6 mg IV TOPICAL ANESTHETIC: Cetacaine Spray  DESCRIPTION OF PROCEDURE:   After the risks benefits and alternatives of the procedure were thoroughly explained, informed consent was obtained.  The EG-[id number] ([id number])  endoscope was introduced through the mouth and advanced to the second portion of the duodenum. The instrument was slowly withdrawn as the mucosa was carefully examined.  Prior to withdrawal of the scope, the guidwire was placed.  The esophagus was dilated successfully.  The patient was recovered in endoscopy and discharged home in satisfactory condition.   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   A stricture was found at the gastroesophageal junction.  The stenosis was traversable with the endoscope.   COLD FORCEPS BIOPSIES OBTAINED 20 CM & 35 CM FROM THE TEETH. GE JXN 40 CM FROM THE TEETH. STOMACH: SUPERFICIAL ULCER AT CARDIA BIOPSIED. Mild non-erosive gastritis (inflammation) was found in the gastric antrum.  Multiple biopsies were performed using cold forceps. DUODENUM: The duodenal mucosa showed no abnormalities in the bulb and second portion of the duodenum.   Dilation was then performed at the mid esophagus & GE JXN. Dilator: Savary over guidewire Size(s): 12.8-15 MM Resistance: MINIMAL TO moderate (15). Heme: none  COMPLICATIONS: There were no complications.  ENDOSCOPIC IMPRESSION: 1.   MID-Esophageal web 2.   Stricture at the gastroesophageal junction 3.   MILD  Non-erosive gastritis & SHALLOW ULCER IN THE CARDIA  RECOMMENDATIONS: CONTINUE OMEPRAZOLE.  TAKE 30 MINUTES PRIOR TO YOUR FIRST MEAL. FOLLOW A LOW FAT DIET. BIOPSY WILL BE BACK IN 7 DAYS. Repeat upper endoscopy with dilation on SEP 19.  FOLLOW UP IN 4 MOS.      _______________________________ Rosalie DoctorJonette Eva, MD 07/26/2013 7:18 AM      PATIENT NAME:  [name], [name] MR#: [id number]

## 2013-07-28 ENCOUNTER — Telehealth: Payer: Self-pay

## 2013-07-28 ENCOUNTER — Other Ambulatory Visit: Payer: Self-pay

## 2013-07-28 DIAGNOSIS — K222 Esophageal obstruction: Secondary | ICD-10-CM

## 2013-07-28 NOTE — Telephone Encounter (Signed)
VM from [facility], said Dr. Darrick Penna wanted pt scheduled for EGD/ED on 08/08/2013, and that is not a good day for them. I returned her call and LMOM for a return call.

## 2013-07-28 NOTE — Telephone Encounter (Signed)
Chery called back and pt is scheduled for 08/14/2013 at 1:00 PM and she is aware that he will need to be there at 12:00 noon.

## 2013-07-29 ENCOUNTER — Encounter (HOSPITAL_COMMUNITY): Payer: Self-pay | Admitting: Gastroenterology

## 2013-07-30 NOTE — Telephone Encounter (Signed)
REVIEWED.  

## 2013-07-31 ENCOUNTER — Telehealth: Payer: Self-pay | Admitting: Gastroenterology

## 2013-07-31 DIAGNOSIS — K219 Gastro-esophageal reflux disease without esophagitis: Secondary | ICD-10-CM

## 2013-07-31 MED ORDER — OMEPRAZOLE 20 MG PO CPDR: DELAYED_RELEASE_CAPSULE | ORAL | Status: DC

## 2013-07-31 NOTE — Telephone Encounter (Signed)
Please call pt. [name] shows gastritis. [name] SHOW [name] IS DUE TO ACID REFLUX. HE SHOULD:  1. CONTINUE OMEPRAZOLE. TAKE 30 MINUTES PRIOR TO MEALS TWICE DAILY FOREVER. A NEW RX HAS BEEN SENT TO [facility].  2. STRICTLY ADHERE TO A LOW FAT DIET.   3. Repeat upper endoscopy with dilation IN OCT.  4. FOLLOW UP JAN 2015.[name]

## 2013-08-01 NOTE — Telephone Encounter (Signed)
Tried to call with no answer  

## 2013-08-05 ENCOUNTER — Telehealth: Payer: Self-pay | Admitting: Gastroenterology

## 2013-08-05 NOTE — Telephone Encounter (Signed)
REVIEWED.  

## 2013-08-05 NOTE — Telephone Encounter (Signed)
I have put a reminder in epic to follow up in January 2015, but do I need to make OV if patient needs repeat endoscopy with dilation in Oct? Please advise.

## 2013-08-05 NOTE — Telephone Encounter (Signed)
Per Soledad Gerlach, pt rescheduled for 08/29/2013 and Dr. Evelina Dun is aware.

## 2013-08-05 NOTE — Telephone Encounter (Signed)
No Dr. Darrick Hernandez is aware he is repeating EGD Oct 10th

## 2013-08-05 NOTE — Telephone Encounter (Signed)
LMOM to call.

## 2013-08-07 NOTE — Telephone Encounter (Signed)
Letter mailed to pt to call.  

## 2013-08-07 NOTE — Telephone Encounter (Signed)
Pt's wife called and was given results.

## 2013-08-15 ENCOUNTER — Encounter (HOSPITAL_COMMUNITY): Payer: Self-pay | Admitting: Pharmacy Technician

## 2013-08-20 DIAGNOSIS — B3781 Candidal esophagitis: Secondary | ICD-10-CM

## 2013-08-20 HISTORY — DX: Candidal esophagitis: B37.81

## 2013-08-29 ENCOUNTER — Ambulatory Visit (HOSPITAL_COMMUNITY)
Admission: RE | Admit: 2013-08-29 | Discharge: 2013-08-29 | Disposition: A | Discharge status: Home / Self Care | Payer: Managed Care, Other (non HMO) | Source: Ambulatory Visit | Attending: Gastroenterology | Admitting: Gastroenterology

## 2013-08-29 ENCOUNTER — Encounter (HOSPITAL_COMMUNITY): Payer: Self-pay | Admitting: *Deleted

## 2013-08-29 ENCOUNTER — Encounter (HOSPITAL_COMMUNITY)
Admission: RE | Disposition: A | Discharge status: Home / Self Care | Payer: Self-pay | Source: Ambulatory Visit | Attending: Gastroenterology

## 2013-08-29 DIAGNOSIS — I10 Essential (primary) hypertension: Secondary | ICD-10-CM | POA: Insufficient documentation

## 2013-08-29 DIAGNOSIS — R1310 Dysphagia, unspecified: Secondary | ICD-10-CM | POA: Insufficient documentation

## 2013-08-29 DIAGNOSIS — K2990 Gastroduodenitis, unspecified, without bleeding: Secondary | ICD-10-CM

## 2013-08-29 DIAGNOSIS — K2970 Gastritis, unspecified, without bleeding: Secondary | ICD-10-CM

## 2013-08-29 DIAGNOSIS — B3781 Candidal esophagitis: Secondary | ICD-10-CM

## 2013-08-29 DIAGNOSIS — K222 Esophageal obstruction: Secondary | ICD-10-CM

## 2013-08-29 DIAGNOSIS — Q391 Atresia of esophagus with tracheo-esophageal fistula: Secondary | ICD-10-CM

## 2013-08-29 DIAGNOSIS — K449 Diaphragmatic hernia without obstruction or gangrene: Secondary | ICD-10-CM | POA: Insufficient documentation

## 2013-08-29 DIAGNOSIS — K2960 Other gastritis without bleeding: Secondary | ICD-10-CM | POA: Insufficient documentation

## 2013-08-29 DIAGNOSIS — Q393 Congenital stenosis and stricture of esophagus: Secondary | ICD-10-CM

## 2013-08-29 HISTORY — PX: ESOPHAGOGASTRODUODENOSCOPY (EGD) WITH ESOPHAGEAL DILATION: SHX5812

## 2013-08-29 HISTORY — DX: Unspecified hearing loss, right ear: H91.91

## 2013-08-29 LAB — KOH PREP

## 2013-08-29 SURGERY — ESOPHAGOGASTRODUODENOSCOPY (EGD) WITH ESOPHAGEAL DILATION
Anesthesia: Moderate Sedation

## 2013-08-29 MED ORDER — MINERAL OIL PO OIL
TOPICAL_OIL | ORAL | Status: AC
  Filled 2013-08-29: qty 30

## 2013-08-29 MED ORDER — SODIUM CHLORIDE 0.9 % IV SOLN
INTRAVENOUS | Status: DC
  Administered 2013-08-29: 1000 mL via INTRAVENOUS

## 2013-08-29 MED ORDER — MIDAZOLAM HCL 5 MG/5ML IJ SOLN
INTRAMUSCULAR | Status: AC
  Filled 2013-08-29: qty 10

## 2013-08-29 MED ORDER — MIDAZOLAM HCL 5 MG/5ML IJ SOLN
INTRAMUSCULAR | Status: DC | PRN
  Administered 2013-08-29: 1 mg via INTRAVENOUS
  Administered 2013-08-29 (×2): 2 mg via INTRAVENOUS

## 2013-08-29 MED ORDER — MEPERIDINE HCL 100 MG/ML IJ SOLN
INTRAMUSCULAR | Status: AC
  Filled 2013-08-29: qty 2

## 2013-08-29 MED ORDER — MEPERIDINE HCL 100 MG/ML IJ SOLN
INTRAMUSCULAR | Status: DC | PRN
  Administered 2013-08-29 (×2): 25 mg via INTRAVENOUS

## 2013-08-29 MED ORDER — STERILE WATER FOR IRRIGATION IR SOLN
Status: DC | PRN
  Administered 2013-08-29: 10:00:00

## 2013-08-29 MED ORDER — FLUCONAZOLE 100 MG PO TABS: ORAL_TABLET | ORAL | Status: DC

## 2013-08-29 NOTE — H&P (Signed)
Primary Care Physician:  Corey Smiles., MD Primary Gastroenterologist:  Dr. Darrick Penna  Pre-Procedure History & Physical: HPI:  [name] is a 62 y.o. male here for DYSPHAGIA.  Past Medical History  Diagnosis Date  . GERD (gastroesophageal reflux disease)   . Esophageal stricture SEP 2011    FOOD IMPACTION-->SAV DIL 16 MM  . Myasthenia gravis   . Hypertension   . Thyroid disease   . Kidney stone   . Deafness in right ear     Past Surgical History  Procedure Laterality Date  . Upper gastrointestinal endoscopy  SEP 2011    SAV DIL  . Kidney stone surgery    . Esophagogastroduodenoscopy N/A 07/25/2013    Procedure: ESOPHAGOGASTRODUODENOSCOPY (EGD);  Surgeon: West Bali, MD;  Location: [facility];  Service: Endoscopy;  Laterality: N/A;  12:30-moved to 12:00 Melanie notified pt  . Cholecystectomy    . Tonsillectomy    . Thymectomy    . Hernia repair      right inguinal    Prior to Admission medications   Medication Sig Start Date End Date Taking? Authorizing Provider  acyclovir (ZOVIRAX) 400 MG tablet Take 400 mg by mouth daily.   Yes Historical Provider, MD  azaTHIOprine (IMURAN) 50 MG tablet Take 150 mg by mouth daily.   Yes Historical Provider, MD  Calcium Carbonate-Vitamin D (CALCIUM 600+D) 600-200 MG-UNIT TABS Take 2 tablets by mouth daily.   Yes Historical Provider, MD  metFORMIN (GLUCOPHAGE-XR) 500 MG 24 hr tablet Take 500 mg by mouth daily with breakfast.  06/30/13  Yes Historical Provider, MD  Omega-3 Fatty Acids (FISH OIL) 1200 MG CAPS Take 2 capsules by mouth daily.   Yes Historical Provider, MD  omeprazole (PRILOSEC) 20 MG capsule Take 40 mg by mouth daily. 07/31/13  Yes West Bali, MD  pyridostigmine (MESTINON) 60 MG tablet Take 60 mg by mouth daily.    Yes Historical Provider, MD  Saw Palmetto, Serenoa repens, (SAW PALMETTO BERRIES PO) Take 1 tablet by mouth daily.    Yes Historical Provider, MD  SYNTHROID 137 MCG tablet Take 137 mcg by mouth daily  before breakfast.  07/03/13  Yes Historical Provider, MD  valsartan (DIOVAN) 80 MG tablet Take 80 mg by mouth daily.    Yes Historical Provider, MD  vitamin C (ASCORBIC ACID) 500 MG tablet Take 1,000 mg by mouth daily.   Yes Historical Provider, MD  vitamin E 400 UNIT capsule Take 800 Units by mouth daily.    Yes Historical Provider, MD    Allergies as of 07/28/2013 - Review Complete 07/25/2013  Allergen Reaction Noted  . Levofloxacin Anaphylaxis 03/01/2011  . Tequin [gatifloxacin] Anaphylaxis 06/07/2012  . Aminoglycosides Other (See Comments) 06/07/2012  . Avelox [moxifloxacin hcl in nacl] Other (See Comments) 06/07/2012  . Botox [onabotulinumtoxina] Other (See Comments) 06/07/2012  . Botulinum toxins Other (See Comments) 06/07/2012  . Calcium channel blockers Other (See Comments) 06/07/2012  . Ciprofloxacin Other (See Comments) 06/07/2012  . Curare [tubocurarine] Other (See Comments) 06/07/2012  . Dysport [abobotulinumtoxina] Other (See Comments) 06/07/2012  . Erythromycin Other (See Comments) 06/07/2012  . Factive [gemifloxacin] Other (See Comments) 06/07/2012  . Floxin [ofloxacin] Other (See Comments) 06/07/2012  . Gentamycin [gentamicin] Other (See Comments) 06/07/2012  . Interferons Other (See Comments) 06/07/2012  . Kanamycin Other (See Comments) 06/07/2012  . Ketek [telithromycin] Other (See Comments) 06/07/2012  . Levaquin [levofloxacin in d5w] Other (See Comments) 06/07/2012  . Macrolides and ketolides Other (See Comments) 06/07/2012  . Myobloc [  rimabotulinumtoxinb] Other (See Comments) 06/07/2012  . Neomycin Other (See Comments) 06/07/2012  . Noroxin [norfloxacin] Other (See Comments) 06/07/2012  . Other Other (See Comments) 06/07/2012  . Penicillamine Other (See Comments) 06/07/2012  . Procainamide Other (See Comments) 06/07/2012  . Propranolol Other (See Comments) 06/07/2012  . Quinidine Other (See Comments) 06/07/2012  . Quinine derivatives Other (See Comments)  06/07/2012  . Streptomycin Other (See Comments) 06/07/2012  . Timolol Other (See Comments) 06/07/2012  . Tobramycin  06/07/2012  . Xeomin [incobotulinumtoxina] Other (See Comments) 06/07/2012  . Zithromax [azithromycin] Other (See Comments) 06/07/2012    History reviewed. No pertinent family history.  History   Social History  . Marital Status: Married    Spouse Name: N/A    Number of Children: N/A  . Years of Education: N/A   Occupational History  . Not on file.   Social History Main Topics  . Smoking status: Never Smoker   . Smokeless tobacco: Never Used  . Alcohol Use: No  . Drug Use: No  . Sexual Activity: Yes    Partners: Female    Pharmacist, hospital Protection: None     Comment: spouse   Other Topics Concern  . Not on file   Social History Narrative  . No narrative on file    Review of Systems: See HPI, otherwise negative ROS   Physical Exam: BP 128/85  Pulse 69  Temp(Src) 97.6 F (36.4 C) (Oral)  Resp 23  Ht 6\' 1"  (1.854 m)  Wt 225 lb (102.059 kg)  BMI 29.69 kg/m2  SpO2 98% General:   Alert,  pleasant and cooperative in NAD Head:  Normocephalic and atraumatic. Neck:  Supple; Lungs:  Clear throughout to auscultation.    Heart:  Regular rate and rhythm. Abdomen:  Soft, nontender and nondistended. Normal bowel sounds, without guarding, and without rebound.   Neurologic:  Alert and  oriented x4;  grossly normal neurologically.  Impression/Plan:     DYSPHAGIA  PLAN:  EGD/DIL TODAY

## 2013-08-29 NOTE — Op Note (Signed)
[facility] [address] [place], [address]   ENDOSCOPY PROCEDURE REPORT  PATIENT: Corey, Hernandez  MR#: [id number] BIRTHDATE: February 14, 1951 , 61  yrs. old GENDER: Male  ENDOSCOPIST: Jonette Eva, MD REFFERED BM:[id number] Sherwood Gambler, M.D.  PROCEDURE DATE:  08/29/2013 PROCEDURE:   EGD with biopsy and EGD with dilatation over guidewire   INDICATIONS:1.  dysphagia. ON IMURAN. MEDICATIONS: Demerol 50 mg IV and Versed 5 mg IV TOPICAL ANESTHETIC: Cetacaine Spray  DESCRIPTION OF PROCEDURE:   After the risks benefits and alternatives of the procedure were thoroughly explained, informed consent was obtained.  The EG-[id number] ([id number])  endoscope was introduced through the mouth and advanced to the second portion of the duodenum. The instrument was slowly withdrawn as the mucosa was carefully examined.  Prior to withdrawal of the scope, the guidwire was placed.  The esophagus was dilated successfully.  The patient was recovered in endoscopy and discharged home in satisfactory condition.   ESOPHAGUS: MULTIPLE WHITE PLAQUES ON ESOPAHGUS.  BRUSH BIOPSIES OBTAINED.  MID-ESOPHAGEAL WEB.   A small hiatal hernia was noted. STOMACH: Moderate non-erosive gastritis (inflammation) was found in the gastric antrum.   DUODENUM: The duodenal mucosa showed no abnormalities in the bulb and second portion of the duodenum. Dilation was then performed at the mid esophagus Dilator: Savary over guidewire Size(s): 14-17 MM Resistance: moderate Heme: yes: SLIGHT  COMPLICATIONS: There were no complications. RESULTS: KOH PREP: POS YEAST  ENDOSCOPIC IMPRESSION: 1.   CANDIDA ESOPHAGITIS 2.   Small hiatal hernia 3.   MODERATE Non-erosive gastritis in the gastric antrum 4.   MID-ESOPHAGEAL WEB.  RECOMMENDATIONS: 1.  CONTINUE OMEPRAZOLE.  TAKE 30 MINUTES PRIOR TO MEALS TWICE DAILY FOREVER. 2.  STRICTLY ADHERE TO A LOW FAT DIET. 3.  ADD DIFLUCAN 200 MG TODAY THEN 100 MG PO QD FOR 20 DAYS 4.  FOLLOW  UP JAN 2015.      _______________________________ Rosalie DoctorJonette Eva, MD 08/29/2013 12:34 PM

## 2013-09-01 ENCOUNTER — Encounter (HOSPITAL_COMMUNITY): Payer: Self-pay | Admitting: Gastroenterology

## 2013-09-03 ENCOUNTER — Telehealth: Payer: Self-pay

## 2013-09-03 NOTE — Telephone Encounter (Signed)
Pt's wife Elnita Maxwell called and said that Dr. Darrick Penna called pt shortly after procedure on 08/29/2013. He was not able to call and understand at that time. Please call Elnita Maxwell and let her know. She can be reached at [phone].

## 2013-09-04 MED ORDER — FLUCONAZOLE 100 MG PO TABS: ORAL_TABLET | ORAL | Status: DC

## 2013-09-04 NOTE — Telephone Encounter (Signed)
Called patient TO DISCUSS CONCERNS. LVM-CALL [phone] TO DISCUSS.  

## 2013-09-04 NOTE — Telephone Encounter (Signed)
SPOKE WITH PT'S WIFE. DISCUSSED RESULTS OF KOH AND PLAN. SHE VOICED UNDERSTANDING OF FINDINGS, AND PLAN. REVIEWED WITH PHARMACY([facility])-NO INTERACTIONS WITH ACV,IMURAN,OR MESTINON.

## 2013-09-04 NOTE — Addendum Note (Signed)
Addended by: West Bali on: 09/04/2013 03:41 PM   Modules accepted: Orders

## 2013-11-26 ENCOUNTER — Encounter: Payer: Self-pay | Admitting: Gastroenterology

## 2013-11-26 ENCOUNTER — Encounter (INDEPENDENT_AMBULATORY_CARE_PROVIDER_SITE_OTHER): Payer: Self-pay

## 2013-11-26 ENCOUNTER — Ambulatory Visit (INDEPENDENT_AMBULATORY_CARE_PROVIDER_SITE_OTHER): Payer: Managed Care, Other (non HMO) | Admitting: Gastroenterology

## 2013-11-26 VITALS — BP 139/80 | HR 65 | Temp 97.4°F | Wt 226.6 lb

## 2013-11-26 DIAGNOSIS — K222 Esophageal obstruction: Secondary | ICD-10-CM

## 2013-11-26 DIAGNOSIS — B3781 Candidal esophagitis: Secondary | ICD-10-CM | POA: Insufficient documentation

## 2013-11-26 NOTE — Assessment & Plan Note (Signed)
COMPLETED DIFLUCAN FOR 21 DAYS.  CONTINUE TO MONITOR SYMPTOMS.

## 2013-11-26 NOTE — Progress Notes (Signed)
Subjective:    Patient ID: [name], male    DOB: 02-19-1951, 63 y.o.   MRN: [id number]  Cassell SmilesFUSCO,LAWRENCE J., MD   HPI PT DENIES FEVER, CHILLS, BRBPR, nausea, vomiting, melena, constipation, abd pain, problems swallowing, problems with sedation, OR heartburn or indigestion. BMs: 1-2X/DAY. RARE LOOSE STOOLS. HEARTBURN: NONE  Past Medical History  Diagnosis Date  . GERD (gastroesophageal reflux disease)   . Esophageal stricture SEP 2011    FOOD IMPACTION-->SAV DIL 16 MM  . Myasthenia gravis   . Hypertension   . Thyroid disease   . Kidney stone   . Deafness in right ear    Past Surgical History  Procedure Laterality Date  . Upper gastrointestinal endoscopy  SEP 2011    SAV DIL  . Kidney stone surgery    . Esophagogastroduodenoscopy N/A 07/25/2013    Procedure: ESOPHAGOGASTRODUODENOSCOPY (EGD);  Surgeon: West BaliSandi L Tykisha Areola, MD;  Location: [facility];  Service: Endoscopy;  Laterality: N/A;  12:30-moved to 12:00 Melanie notified pt  . Cholecystectomy    . Tonsillectomy    . Thymectomy    . Hernia repair      right inguinal  . Esophagogastroduodenoscopy (egd) with esophageal dilation N/A 08/29/2013    Procedure: ESOPHAGOGASTRODUODENOSCOPY (EGD) WITH ESOPHAGEAL DILATION;  Surgeon: West BaliSandi L Romelia Bromell, MD;  Location: [facility];  Service: Endoscopy;  Laterality: N/A;  1:00 PM-rescheduled to 930 Leigh Ann notified pt   Allergies  Allergen Reactions  . Levofloxacin Anaphylaxis  . Tequin [Gatifloxacin] Anaphylaxis  . Aminoglycosides Other (See Comments)    REACTION: Myasthenia Gravis (MG) Medication Alert  . Avelox [Moxifloxacin Hcl In Nacl] Other (See Comments)    REACTION: FLUOROQUINOLONE ANTIBIOTICS: Myasthenia Gravis (MG) Medication Alert  . Botox [Onabotulinumtoxina] Other (See Comments)    REACTION: Myasthenia Gravis (MG) Medication Alert  . Botulinum Toxins Other (See Comments)    REACTION: Myasthenia Gravis (MG) Medication Alert  . Calcium Channel Blockers Other (See  Comments)    (BLOOD PRESSURE MEDICATION) REACTION: Myasthenia Gravis (MG) Medication Alert  . Ciprofloxacin Other (See Comments)    REACTION: FLUOROQUINOLONE ANTIBIOTICS: Myasthenia Gravis (MG) Medication Alert  . Curare [Tubocurarine] Other (See Comments)    (Usually only used during surgery)  REACTION: Myasthenia Gravis (MG) Medication Alert  . Dysport [Abobotulinumtoxina] Other (See Comments)    REACTION: Myasthenia Gravis (MG) Medication Alert  . Erythromycin Other (See Comments)    REACTION: Myasthenia Gravis (MG) Medication Alert  . Factive [Gemifloxacin] Other (See Comments)    REACTION: FLUOROQUINOLONE ANTIBIOTICS: Myasthenia Gravis (MG) Medication Alert  . Floxin [Ofloxacin] Other (See Comments)    REACTION: FLUOROQUINOLONE ANTIBIOTICS: Myasthenia Gravis (MG) Medication Alert  . Gentamycin [Gentamicin] Other (See Comments)    REACTION:Myasthenia Gravis (MG) Medication Alert  . Interferons Other (See Comments)    REACTION: Myasthenia Gravis (MG) Medication Alert  . Kanamycin Other (See Comments)    REACTION: Myasthenia Gravis (MG) Medication Alert  . Ketek [Telithromycin] Other (See Comments)    REACTION: Myasthenia Gravis (MG) Medication Alert  . Levaquin [Levofloxacin In D5w] Other (See Comments)    REACTION: FLUOROQUINOLONE ANTIBIOTICS: Myasthenia Gravis (MG) Medication Alert  . Macrolides And Ketolides Other (See Comments)    REACTION: Myasthenia Gravis (MG) Medication Alert  . Myobloc [Rimabotulinumtoxinb] Other (See Comments)    REACTION: Myasthenia Gravis (MG) Medication Alert  . Neomycin Other (See Comments)    REACTION: Myasthenia Gravis (MG) Medication Alert  . Noroxin [Norfloxacin] Other (See Comments)    REACTION: FLUOROQUINOLONE ANTIBIOTICS: Myasthenia Gravis (MG) Medication  Alert  . Other Other (See Comments)    MAGNESIUM SALTS: Milk of Magnesia, some antacids (Maalox, Mylanta) REACTION: Myasthenia Gravis (MG) Medication Alerta  . Penicillamine Other (See  Comments)    D-PENICILLAMINE *DO NOT CONFUSE WITH PENICILLIN*  REACTION: Myasthenia Gravis (MG) Medication Alert  . Procainamide Other (See Comments)    REACTION: Myasthenia Gravis (MG) Medication Alert  . Propranolol Other (See Comments)    REACTION: Myasthenia Gravis (MG) Medication Alert  . Quinidine Other (See Comments)    REACTION: Myasthenia Gravis (MG) Medication Alert  . Quinine Derivatives Other (See Comments)    REACTION: Myasthenia Gravis (MG) Medication Alert  . Streptomycin Other (See Comments)    REACTION:  Myasthenia Gravis (MG) Medication Alert  . Timolol Other (See Comments)    REACTION: Myasthenia Gravis (MG) Medication Alert  . Tobramycin     REACTION: Myasthenia Gravis (MG) Medication Alert  . Xeomin [Incobotulinumtoxina] Other (See Comments)    REACTION: Myasthenia Gravis (MG) Medication Alert  . Zithromax [Azithromycin] Other (See Comments)    REACTION: Myasthenia Gravis (MG) Medication Alert   Current Outpatient Prescriptions  Medication Sig Dispense Refill  . acyclovir (ZOVIRAX) 400 MG tablet Take 400 mg by mouth daily.      [name] azaTHIOprine (IMURAN) 50 MG tablet Take 150 mg by mouth daily.      . Calcium Carbonate-Vitamin D (CALCIUM 600+D) 600-200 MG-UNIT TABS Take 2 tablets by mouth daily.      .      . metFORMIN (GLUCOPHAGE-XR) 500 MG 24 hr tablet Take 500 mg by mouth 2 (two) times daily. 2 pills bid      . Omega-3 Fatty Acids (FISH OIL) 1200 MG CAPS Take 2 capsules by mouth daily.      [name] omeprazole (PRILOSEC) 20 MG capsule Take 40 mg by mouth daily.      [name] pyridostigmine (MESTINON) 60 MG tablet Take 60 mg by mouth daily.       . Saw Palmetto, Serenoa repens, (SAW PALMETTO BERRIES PO) Take 1 tablet by mouth daily.       [name] SYNTHROID 137 MCG tablet Take 137 mcg by mouth daily before breakfast.       . valsartan (DIOVAN) 80 MG tablet Take 80 mg by mouth daily.       . vitamin C (ASCORBIC ACID) 500 MG tablet Take 1,000 mg by mouth daily.      . vitamin E 400  UNIT capsule Take 800 Units by mouth daily.          Review of Systems     Objective:   Physical Exam        Assessment & Plan:

## 2013-11-26 NOTE — Patient Instructions (Signed)
Use Prilosec 30 minutes prior to your first meal.  FOLLOW A LOW FAT DIET. SEE INFO BELOW.  CONTINUE YOUR WEIGHT LOSS EFFORTS.  FOLLOW UP IN 1 YEAR.    Low-Fat Diet BREADS, CEREALS, PASTA, RICE, DRIED PEAS, AND BEANS These products are high in carbohydrates and most are low in fat. Therefore, they can be increased in the diet as substitutes for fatty foods. They too, however, contain calories and should not be eaten in excess. Cereals can be eaten for snacks as well as for breakfast.   FRUITS AND VEGETABLES It is good to eat fruits and vegetables. Besides being sources of fiber, both are rich in vitamins and some minerals. They help you get the daily allowances of these nutrients. Fruits and vegetables can be used for snacks and desserts.  MEATS Limit lean meat, chicken, [place], and fish to no more than 6 ounces per day. Beef, Pork, and Lamb Use lean cuts of beef, pork, and lamb. Lean cuts include:  Extra-lean ground beef.  Arm roast.  Sirloin tip.  Center-cut ham.  Round steak.  Loin chops.  Rump roast.  Tenderloin.  Trim all fat off the outside of meats before cooking. It is not necessary to severely decrease the intake of red meat, but lean choices should be made. Lean meat is rich in protein and contains a highly absorbable form of iron. Premenopausal women, in particular, should avoid reducing lean red meat because this could increase the risk for low red blood cells (iron-deficiency anemia).  Chicken and [place] These are good sources of protein. The fat of poultry can be reduced by removing the skin and underlying fat layers before cooking. Chicken and [place] can be substituted for lean red meat in the diet. Poultry should not be fried or covered with high-fat sauces. Fish and Shellfish Fish is a good source of protein. Shellfish contain cholesterol, but they usually are low in saturated fatty acids. The preparation of fish is important. Like chicken and [place], they should not  be fried or covered with high-fat sauces. EGGS Egg whites contain no fat or cholesterol. They can be eaten often. Try 1 to 2 egg whites instead of whole eggs in recipes or use egg substitutes that do not contain yolk. MILK AND DAIRY PRODUCTS Use skim or 1% milk instead of 2% or whole milk. Decrease whole milk, natural, and processed cheeses. Use nonfat or low-fat (2%) cottage cheese or low-fat cheeses made from vegetable oils. Choose nonfat or low-fat (1 to 2%) yogurt. Experiment with evaporated skim milk in recipes that call for heavy cream. Substitute low-fat yogurt or low-fat cottage cheese for sour cream in dips and salad dressings. Have at least 2 servings of low-fat dairy products, such as 2 glasses of skim (or 1%) milk each day to help get your daily calcium intake. FATS AND OILS Reduce the total intake of fats, especially saturated fat. Butterfat, lard, and beef fats are high in saturated fat and cholesterol. These should be avoided as much as possible. Vegetable fats do not contain cholesterol, but certain vegetable fats, such as coconut oil, palm oil, and palm kernel oil are very high in saturated fats. These should be limited. These fats are often used in bakery goods, processed foods, popcorn, oils, and nondairy creamers. Vegetable shortenings and some peanut butters contain hydrogenated oils, which are also saturated fats. Read the labels on these foods and check for saturated vegetable oils. Unsaturated vegetable oils and fats do not raise blood cholesterol. However, they should  be limited because they are fats and are high in calories. Total fat should still be limited to 30% of your daily caloric intake. Desirable liquid vegetable oils are corn oil, cottonseed oil, olive oil, canola oil, safflower oil, soybean oil, and sunflower oil. Peanut oil is not as good, but small amounts are acceptable. Buy a heart-healthy tub margarine that has no partially hydrogenated oils in the ingredients.  Mayonnaise and salad dressings often are made from unsaturated fats, but they should also be limited because of their high calorie and fat content. Seeds, nuts, peanut butter, olives, and avocados are high in fat, but the fat is mainly the unsaturated type. These foods should be limited mainly to avoid excess calories and fat. OTHER EATING TIPS Snacks  Most sweets should be limited as snacks. They tend to be rich in calories and fats, and their caloric content outweighs their nutritional value. Some good choices in snacks are graham crackers, melba toast, soda crackers, bagels (no egg), English muffins, fruits, and vegetables. These snacks are preferable to snack crackers, [place] fries, TORTILLA CHIPS, and POTATO chips. Popcorn should be air-popped or cooked in small amounts of liquid vegetable oil. Desserts Eat fruit, low-fat yogurt, and fruit ices instead of pastries, cake, and cookies. Sherbet, angel food cake, gelatin dessert, frozen low-fat yogurt, or other frozen products that do not contain saturated fat (pure fruit juice bars, frozen ice pops) are also acceptable.  COOKING METHODS Choose those methods that use little or no fat. They include: Poaching.  Braising.  Steaming.  Grilling.  Baking.  Stir-frying.  Broiling.  Microwaving.  Foods can be cooked in a nonstick pan without added fat, or use a nonfat cooking spray in regular cookware. Limit fried foods and avoid frying in saturated fat. Add moisture to lean meats by using water, broth, cooking wines, and other nonfat or low-fat sauces along with the cooking methods mentioned above. Soups and stews should be chilled after cooking. The fat that forms on top after a few hours in the refrigerator should be skimmed off. When preparing meals, avoid using excess salt. Salt can contribute to raising blood pressure in some people.  EATING AWAY FROM HOME Order entres, potatoes, and vegetables without sauces or butter. When meat exceeds the  size of a deck of cards (3 to 4 ounces), the rest can be taken home for another meal. Choose vegetable or fruit salads and ask for low-calorie salad dressings to be served on the side. Use dressings sparingly. Limit high-fat toppings, such as bacon, crumbled eggs, cheese, sunflower seeds, and olives. Ask for heart-healthy tub margarine instead of butter.

## 2013-11-26 NOTE — Assessment & Plan Note (Signed)
SX RESOLVED.  CONTINUE PRILOSEC. LOW FAT DIET OPV IN 1 YEAR

## 2013-11-26 NOTE — Progress Notes (Signed)
Reminder in epic °

## 2013-11-27 NOTE — Progress Notes (Signed)
Cc'd to pcp 

## 2014-04-01 ENCOUNTER — Encounter (HOSPITAL_COMMUNITY): Payer: Self-pay | Admitting: Emergency Medicine

## 2014-04-01 ENCOUNTER — Emergency Department (HOSPITAL_COMMUNITY): Payer: Managed Care, Other (non HMO)

## 2014-04-01 ENCOUNTER — Inpatient Hospital Stay (HOSPITAL_COMMUNITY)
Admission: EM | Admit: 2014-04-01 | Discharge: 2014-04-03 | DRG: 392 | Disposition: A | Discharge status: Home / Self Care | Payer: Managed Care, Other (non HMO) | Attending: Internal Medicine | Admitting: Internal Medicine

## 2014-04-01 DIAGNOSIS — E876 Hypokalemia: Secondary | ICD-10-CM | POA: Diagnosis present

## 2014-04-01 DIAGNOSIS — H9190 Unspecified hearing loss, unspecified ear: Secondary | ICD-10-CM | POA: Diagnosis present

## 2014-04-01 DIAGNOSIS — I10 Essential (primary) hypertension: Secondary | ICD-10-CM | POA: Diagnosis present

## 2014-04-01 DIAGNOSIS — K5792 Diverticulitis of intestine, part unspecified, without perforation or abscess without bleeding: Secondary | ICD-10-CM

## 2014-04-01 DIAGNOSIS — Z87442 Personal history of urinary calculi: Secondary | ICD-10-CM

## 2014-04-01 DIAGNOSIS — K219 Gastro-esophageal reflux disease without esophagitis: Secondary | ICD-10-CM | POA: Diagnosis present

## 2014-04-01 DIAGNOSIS — G7000 Myasthenia gravis without (acute) exacerbation: Secondary | ICD-10-CM | POA: Diagnosis present

## 2014-04-01 DIAGNOSIS — Z79899 Other long term (current) drug therapy: Secondary | ICD-10-CM

## 2014-04-01 DIAGNOSIS — R197 Diarrhea, unspecified: Secondary | ICD-10-CM | POA: Diagnosis present

## 2014-04-01 DIAGNOSIS — K5732 Diverticulitis of large intestine without perforation or abscess without bleeding: Principal | ICD-10-CM | POA: Diagnosis present

## 2014-04-01 LAB — CBC WITH DIFFERENTIAL/PLATELET
Basophils Absolute: 0 10*3/uL (ref 0.0–0.1)
Basophils Relative: 0 % (ref 0–1)
Eosinophils Absolute: 0.1 10*3/uL (ref 0.0–0.7)
Eosinophils Relative: 1 % (ref 0–5)
HCT: 38.1 % — ABNORMAL LOW (ref 39.0–52.0)
Hemoglobin: 13.6 g/dL (ref 13.0–17.0)
Lymphocytes Relative: 11 % — ABNORMAL LOW (ref 12–46)
Lymphs Abs: 1 10*3/uL (ref 0.7–4.0)
MCH: 31.7 pg (ref 26.0–34.0)
MCHC: 35.7 g/dL (ref 30.0–36.0)
MCV: 88.8 fL (ref 78.0–100.0)
Monocytes Absolute: 1.5 10*3/uL — ABNORMAL HIGH (ref 0.1–1.0)
Monocytes Relative: 16 % — ABNORMAL HIGH (ref 3–12)
Neutro Abs: 6.9 10*3/uL (ref 1.7–7.7)
Neutrophils Relative %: 72 % (ref 43–77)
Platelets: 178 10*3/uL (ref 150–400)
RBC: 4.29 MIL/uL (ref 4.22–5.81)
RDW: 14.1 % (ref 11.5–15.5)
WBC: 9.5 10*3/uL (ref 4.0–10.5)

## 2014-04-01 LAB — COMPREHENSIVE METABOLIC PANEL
ALT: 27 U/L (ref 0–53)
AST: 21 U/L (ref 0–37)
Albumin: 3.5 g/dL (ref 3.5–5.2)
Alkaline Phosphatase: 80 U/L (ref 39–117)
BUN: 13 mg/dL (ref 6–23)
CO2: 28 mEq/L (ref 19–32)
Calcium: 9 mg/dL (ref 8.4–10.5)
Chloride: 100 mEq/L (ref 96–112)
Creatinine, Ser: 0.94 mg/dL (ref 0.50–1.35)
GFR calc Af Amer: >90 mL/min (ref >90)
GFR calc non Af Amer: 88 mL/min — ABNORMAL LOW (ref >90)
Glucose, Bld: 96 mg/dL (ref 70–99)
Potassium: 3.6 mEq/L — ABNORMAL LOW (ref 3.7–5.3)
Sodium: 139 mEq/L (ref 137–147)
Total Bilirubin: 1.1 mg/dL (ref 0.3–1.2)
Total Protein: 6.7 g/dL (ref 6.0–8.3)

## 2014-04-01 LAB — URINALYSIS, ROUTINE W REFLEX MICROSCOPIC
Bilirubin Urine: NEGATIVE
Glucose, UA: NEGATIVE mg/dL
Ketones, ur: 15 mg/dL — AB
Leukocytes, UA: NEGATIVE
Nitrite: NEGATIVE
Protein, ur: NEGATIVE mg/dL
Specific Gravity, Urine: 1.01 (ref 1.005–1.030)
Urobilinogen, UA: 0.2 mg/dL (ref 0.0–1.0)
pH: 6 (ref 5.0–8.0)

## 2014-04-01 LAB — URINE MICROSCOPIC-ADD ON

## 2014-04-01 MED ORDER — METRONIDAZOLE IN NACL 5-0.79 MG/ML-% IV SOLN
500.0000 mg | Freq: Once | INTRAVENOUS | Status: AC
  Administered 2014-04-01: 500 mg via INTRAVENOUS
  Filled 2014-04-01: qty 100

## 2014-04-01 MED ORDER — SODIUM CHLORIDE 0.9 % IV SOLN
3.0000 g | Freq: Once | INTRAVENOUS | Status: AC
  Administered 2014-04-01: 3 g via INTRAVENOUS
  Filled 2014-04-01: qty 3

## 2014-04-01 MED ORDER — SODIUM CHLORIDE 0.9 % IV SOLN
1000.0000 mL | INTRAVENOUS | Status: DC
  Administered 2014-04-01: 1000 mL via INTRAVENOUS

## 2014-04-01 MED ORDER — SODIUM CHLORIDE 0.9 % IJ SOLN
INTRAMUSCULAR | Status: AC
  Filled 2014-04-01: qty 350

## 2014-04-01 MED ORDER — POTASSIUM CHLORIDE CRYS ER 20 MEQ PO TBCR
40.0000 meq | EXTENDED_RELEASE_TABLET | Freq: Once | ORAL | Status: AC
  Administered 2014-04-01: 40 meq via ORAL
  Filled 2014-04-01: qty 2

## 2014-04-01 MED ORDER — SODIUM CHLORIDE 0.9 % IV SOLN
1000.0000 mL | Freq: Once | INTRAVENOUS | Status: AC
  Administered 2014-04-01: 1000 mL via INTRAVENOUS

## 2014-04-01 NOTE — ED Provider Notes (Signed)
CSN: [id number]     Arrival date & time 04/01/14  1752 History  This chart was scribed for Ward Givens, MD by Charline Bills, ED Scribe. The patient was seen in room APA04/APA04. Patient's care was started at 8:30 PM.    Chief Complaint  Patient presents with  . Fever   The history is provided by the patient. No language interpreter was used.   HPI Comments: [name] is a 63 y.o. male, with a h/o Myasthenia gravis for 12 years, who presents to the Emergency Department complaining of intermittent fever onset 6 days ago, particularly in the afternoons. Tmax 101 F. ED temperature 99.8 F. He reports associated abdominal pain and watery diarrhea, without blood, onset 6 days ago. He reports feeling better the next day and returned to work for half a day, but states the diarrhea and fever returned that night. Pt went to visit Dr. Phillips Odor Monday, 2 days ago and was given Zofran and Lomotil. Pt took 1 dosage of each and diarrhea resolved until later that day. Pt reports 7 lb weight lost since being sick and 1 episode of feeling like his balance was off. He denies blood in stools and dizziness. Pt denies smoking or alcohol use. No sick contacts, no new foods tried. No recent travel to endemic areas. Colonoscopy about 10 years ago.  Pt works as a Sales promotion account executive and sewer.   PCP Dr Sherwood Gambler  Past Medical History  Diagnosis Date  . GERD (gastroesophageal reflux disease)   . Esophageal stricture SEP 2011    FOOD IMPACTION-->SAV DIL 16 MM  . Myasthenia gravis   . Hypertension   . Thyroid disease   . Kidney stone   . Deafness in right ear   . Candida esophagitis OCT 2014   Past Surgical History  Procedure Laterality Date  . Upper gastrointestinal endoscopy  SEP 2011    SAV DIL  . Kidney stone surgery    . Esophagogastroduodenoscopy N/A 07/25/2013    Procedure: ESOPHAGOGASTRODUODENOSCOPY (EGD);  Surgeon: West Bali, MD;  Location: [facility];  Service:  Endoscopy;  Laterality: N/A;  12:30-moved to 12:00 Melanie notified pt  . Cholecystectomy    . Tonsillectomy    . Thymectomy    . Hernia repair      right inguinal  . Esophagogastroduodenoscopy (egd) with esophageal dilation N/A 08/29/2013    Procedure: ESOPHAGOGASTRODUODENOSCOPY (EGD) WITH ESOPHAGEAL DILATION;  Surgeon: West Bali, MD;  Location: [facility];  Service: Endoscopy;  Laterality: N/A;  1:00 PM-rescheduled to 930 Leigh Ann notified pt   No family history on file. History  Substance Use Topics  . Smoking status: Never Smoker   . Smokeless tobacco: Never Used  . Alcohol Use: No  employed Lives at home Lives with spouse  Review of Systems  Constitutional: Positive for unexpected weight change.  Gastrointestinal: Positive for abdominal pain. Negative for blood in stool.  Neurological: Negative for dizziness.  All other systems reviewed and are negative.   Allergies  Levofloxacin; Tequin; Aminoglycosides; Avelox; Botox; Botulinum toxins; Calcium channel blockers; Ciprofloxacin; Curare; Dysport; Erythromycin; Factive; Floxin; Gentamycin; Interferons; Kanamycin; Ketek; Levaquin; Macrolides and ketolides; Magnesium-containing compounds; Myobloc; Neomycin; Noroxin; Other; Penicillamine; Procainamide; Propranolol; Quinidine; Quinine derivatives; Streptomycin; Timolol; Tobramycin; Xeomin; and Zithromax  Home Medications   Prior to Admission medications   Medication Sig Start Date End Date Taking? Authorizing Provider  acyclovir (ZOVIRAX) 400 MG tablet Take 400 mg by mouth daily.    Historical Provider, MD  azaTHIOprine (IMURAN) 50 MG tablet Take 150 mg by mouth daily.    Historical Provider, MD  Calcium Carbonate-Vitamin D (CALCIUM 600+D) 600-200 MG-UNIT TABS Take 2 tablets by mouth daily.    Historical Provider, MD  fluconazole (DIFLUCAN) 100 MG tablet 2 po today then 1 po qd for 20 days 09/04/13   West BaliSandi L Fields, MD  metFORMIN (GLUCOPHAGE-XR) 500 MG 24 hr tablet Take  500 mg by mouth 2 (two) times daily. 2 pills bid 06/30/13   Historical Provider, MD  Omega-3 Fatty Acids (FISH OIL) 1200 MG CAPS Take 2 capsules by mouth daily.    Historical Provider, MD  omeprazole (PRILOSEC) 20 MG capsule Take 40 mg by mouth daily. 07/31/13   West BaliSandi L Fields, MD  pyridostigmine (MESTINON) 60 MG tablet Take 60 mg by mouth daily.     Historical Provider, MD  Saw Palmetto, Serenoa repens, (SAW PALMETTO BERRIES PO) Take 1 tablet by mouth daily.     Historical Provider, MD  SYNTHROID 137 MCG tablet Take 137 mcg by mouth daily before breakfast.  07/03/13   Historical Provider, MD  valsartan (DIOVAN) 80 MG tablet Take 80 mg by mouth daily.     Historical Provider, MD  vitamin C (ASCORBIC ACID) 500 MG tablet Take 1,000 mg by mouth daily.    Historical Provider, MD  vitamin E 400 UNIT capsule Take 800 Units by mouth daily.     Historical Provider, MD   Triage Vitals: BP 134/67  Pulse 97  Temp(Src) 99.8 F (37.7 C) (Oral)  Resp 18  SpO2 98%  Vital signs normal   Physical Exam  Nursing note and vitals reviewed. Constitutional: He is oriented to person, place, and time. He appears well-developed and well-nourished.  Non-toxic appearance. He does not appear ill. No distress.  HENT:  Head: Normocephalic and atraumatic.  Right Ear: External ear normal.  Left Ear: External ear normal.  Nose: Nose normal. No mucosal edema or rhinorrhea.  Mouth/Throat: Oropharynx is clear and moist and mucous membranes are normal. No dental abscesses or uvula swelling.  Eyes: Conjunctivae and EOM are normal. Pupils are equal, round, and reactive to light.  Neck: Normal range of motion and full passive range of motion without pain. Neck supple.  Cardiovascular: Normal rate, regular rhythm and normal heart sounds.  Exam reveals no gallop and no friction rub.   No murmur heard. Pulmonary/Chest: Effort normal and breath sounds normal. No respiratory distress. He has no wheezes. He has no rhonchi. He has no  rales. He exhibits no tenderness and no crepitus.  Abdominal: Soft. Normal appearance and bowel sounds are normal. He exhibits no distension. There is tenderness. There is no rebound and no guarding.    Active bowel sounds  Musculoskeletal: Normal range of motion. He exhibits no edema and no tenderness.  Moves all extremities well.   Neurological: He is alert and oriented to person, place, and time. He has normal strength. No cranial nerve deficit.  Skin: Skin is warm, dry and intact. No rash noted. No erythema. No pallor.  Psychiatric: He has a normal mood and affect. His speech is normal and behavior is normal. His mood appears not anxious.    ED Course  Procedures (including critical care time)  Medications  0.9 %  sodium chloride infusion (0 mLs Intravenous Stopped 04/01/14 2230)    Followed by  0.9 %  sodium chloride infusion (1,000 mLs Intravenous New Bag/Given 04/01/14 2231)  sodium chloride 0.9 % injection (not administered)  metroNIDAZOLE (FLAGYL)  IVPB 500 mg (500 mg Intravenous New Bag/Given 04/01/14 2238)  Ampicillin-Sulbactam (UNASYN) 3 g in sodium chloride 0.9 % 100 mL IVPB (not administered)  potassium chloride SA (K-DUR,KLOR-CON) CR tablet 40 mEq (40 mEq Oral Given 04/01/14 2240)    DIAGNOSTIC STUDIES: Oxygen Saturation is 98% on RA, normal by my interpretation.    COORDINATION OF CARE: 8:43 PM Discussed treatment plan with pt at bedside and pt agreed to plan.  10:28 PM Discussed findings with pt. Pt started on IV antibiotics for his diverticulitis. He was also started on oral potassium for his hypokalemia. Pt agreeable to admission, states he is weak from the diarrhea and he has the MG.    23:06 Dr Conley Rolls will see patient and admit.   Labs Review Results for orders placed during the hospital encounter of 04/01/14  CBC WITH DIFFERENTIAL      Result Value Ref Range   WBC 9.5  4.0 - 10.5 K/uL   RBC 4.29  4.22 - 5.81 MIL/uL   Hemoglobin 13.6  13.0 - 17.0 g/dL   HCT 16.1  (*) 09.6 - 52.0 %   MCV 88.8  78.0 - 100.0 fL   MCH 31.7  26.0 - 34.0 pg   MCHC 35.7  30.0 - 36.0 g/dL   RDW 04.5  40.9 - 81.1 %   Platelets 178  150 - 400 K/uL   Neutrophils Relative % 72  43 - 77 %   Lymphocytes Relative 11 (*) 12 - 46 %   Monocytes Relative 16 (*) 3 - 12 %   Eosinophils Relative 1  0 - 5 %   Basophils Relative 0  0 - 1 %   Neutro Abs 6.9  1.7 - 7.7 K/uL   Lymphs Abs 1.0  0.7 - 4.0 K/uL   Monocytes Absolute 1.5 (*) 0.1 - 1.0 K/uL   Eosinophils Absolute 0.1  0.0 - 0.7 K/uL   Basophils Absolute 0.0  0.0 - 0.1 K/uL   WBC Morphology ATYPICAL LYMPHOCYTES    COMPREHENSIVE METABOLIC PANEL      Result Value Ref Range   Sodium 139  137 - 147 mEq/L   Potassium 3.6 (*) 3.7 - 5.3 mEq/L   Chloride 100  96 - 112 mEq/L   CO2 28  19 - 32 mEq/L   Glucose, Bld 96  70 - 99 mg/dL   BUN 13  6 - 23 mg/dL   Creatinine, Ser 9.14  0.50 - 1.35 mg/dL   Calcium 9.0  8.4 - 78.2 mg/dL   Total Protein 6.7  6.0 - 8.3 g/dL   Albumin 3.5  3.5 - 5.2 g/dL   AST 21  0 - 37 U/L   ALT 27  0 - 53 U/L   Alkaline Phosphatase 80  39 - 117 U/L   Total Bilirubin 1.1  0.3 - 1.2 mg/dL   GFR calc non Af Amer 88 (*) >90 mL/min   GFR calc Af Amer >90  >90 mL/min  URINALYSIS, ROUTINE W REFLEX MICROSCOPIC      Result Value Ref Range   Color, Urine YELLOW  YELLOW   APPearance CLEAR  CLEAR   Specific Gravity, Urine 1.010  1.005 - 1.030   pH 6.0  5.0 - 8.0   Glucose, UA NEGATIVE  NEGATIVE mg/dL   Hgb urine dipstick TRACE (*) NEGATIVE   Bilirubin Urine NEGATIVE  NEGATIVE   Ketones, ur 15 (*) NEGATIVE mg/dL   Protein, ur NEGATIVE  NEGATIVE mg/dL   Urobilinogen, UA 0.2  0.0 - 1.0 mg/dL   Nitrite NEGATIVE  NEGATIVE   Leukocytes, UA NEGATIVE  NEGATIVE  URINE MICROSCOPIC-ADD ON      Result Value Ref Range   RBC / HPF 0-2  <3 RBC/hpf   Bacteria, UA RARE  RARE    Imaging Review Ct Abdomen Pelvis Wo Contrast  04/01/2014   CLINICAL DATA:  Fever and diarrhea for 6 days.  EXAM: CT ABDOMEN AND PELVIS WITHOUT  CONTRAST  TECHNIQUE: Multidetector CT imaging of the abdomen and pelvis was performed following the standard protocol without IV contrast.  COMPARISON:  DG RETROGRADE PYELOGRAM dated 07/16/2009; CT PELVIS W/O CM dated 05/19/2009  FINDINGS: Surgical absence of the gallbladder. Small accessory spleen. The unenhanced appearance of the liver, spleen, pancreas, adrenal glands, kidneys, abdominal aorta, inferior vena cava, and retroperitoneal lymph nodes is unremarkable. The stomach, small bowel, and colon are decompressed. No free air or free fluid in the abdomen.  Pelvis: Diverticulosis of the sigmoid colon. Colonic wall thickening and infiltration around the sigmoid region consistent with diverticulitis. No abscess. There is a small left inguinal hernia containing fat. No free or loculated pelvic fluid collections. Appendix appears to be surgically absent. No significant pelvic lymphadenopathy. The bladder is distended without wall thickening. Degenerative changes in the spine.  IMPRESSION: Diverticulosis with inflammatory changes around the sigmoid colon consistent with diverticulitis. No abscess. Small left inguinal hernia containing fat.   Electronically Signed   By: Burman Nieves M.D.   On: 04/01/2014 22:02     EKG Interpretation None      MDM   Final diagnoses:  Hypokalemia  Diarrhea  Diverticulitis  MG (myasthenia gravis)    I personally performed the services described in this documentation, which was scribed in my presence. The recorded information has been reviewed and considered.  Devoria Albe, MD, [facility] Gang    Ward Givens, MD 04/01/14 [phone]

## 2014-04-01 NOTE — ED Notes (Signed)
Diarrhea began Saturday. Pt was seen by PCP on Monday and given lomotil and zofran. Pt states he has been running a fever in the afternoons. States slight body aches. Pt also states he is having difficulty concentrating. Pt has hx of myasthenia gravis.

## 2014-04-01 NOTE — H&P (Signed)
[facility] History and Physical  ALLON COSTLOW ZOX:[id number] DOB: 1951-03-28    PCP:   Cassell Smiles., MD   Chief Complaint: diarrhea and low abdominal pain.  HPI: [name] is an 63 y.o. male with hx of myasthenia gravis, on Mestinon and Imuran, s/p thymectomy, hx of GERD, right ear deafness, HTN, kidney stone, thyroid disease, presented to the ER with one day history of diarrhea, weakness, and low abdominal pain.  He denied any fever,chills, nausea, vomtiing, chest pain or shortenss of breath.  He has had no black or bloody stool.  Wife lives at home with him hasn't been ill.  He has no distant travel, and denied being on any antibiotic recently.  Evaluation in the ER showed normal WBC with normal renal fx tests.  He has no leukocytosis.  An abdominal pelvic CT showed evidence of acute diverticulitis.  He has numerous allergies to many medicine, so he was given IV Unasyn along with IV Flagyl.  Hospitalist was asked to admit him for further work up and treatment.  His last colonoscopy was 10 years ago, and he is due for another one soon.  Rewiew of Systems:  Constitutional: Negative for malaise, fever and chills. No significant weight loss or weight gain Eyes: Negative for eye pain, redness and discharge, diplopia, visual changes, or flashes of light. ENMT: Negative for ear pain, hoarseness, nasal congestion, sinus pressure and sore throat. No headaches; tinnitus, drooling, or problem swallowing. Cardiovascular: Negative for chest pain, palpitations, diaphoresis, dyspnea and peripheral edema. ; No orthopnea, PND Respiratory: Negative for cough, hemoptysis, wheezing and stridor. No pleuritic chestpain. Gastrointestinal: Negative for nausea, vomiting, constipation,  melena, blood in stool, hematemesis, jaundice and rectal bleeding.    Genitourinary: Negative for frequency, dysuria, incontinence,flank pain and hematuria; Musculoskeletal: Negative for back pain and neck pain.  Negative for swelling and trauma.;  Skin: . Negative for pruritus, rash, abrasions, bruising and skin lesion.; ulcerations Neuro: Negative for headache, lightheadedness and neck stiffness. Negative for weakness, altered level of consciousness , altered mental status, extremity weakness, burning feet, involuntary movement, seizure and syncope.  Psych: negative for anxiety, depression, insomnia, tearfulness, panic attacks, hallucinations, paranoia, suicidal or homicidal ideation   Past Medical History  Diagnosis Date  . GERD (gastroesophageal reflux disease)   . Esophageal stricture SEP 2011    FOOD IMPACTION-->SAV DIL 16 MM  . Myasthenia gravis   . Hypertension   . Thyroid disease   . Kidney stone   . Deafness in right ear   . Candida esophagitis OCT 2014    Past Surgical History  Procedure Laterality Date  . Upper gastrointestinal endoscopy  SEP 2011    SAV DIL  . Kidney stone surgery    . Esophagogastroduodenoscopy N/A 07/25/2013    Procedure: ESOPHAGOGASTRODUODENOSCOPY (EGD);  Surgeon: West Bali, MD;  Location: [facility];  Service: Endoscopy;  Laterality: N/A;  12:30-moved to 12:00 Melanie notified pt  . Cholecystectomy    . Tonsillectomy    . Thymectomy    . Hernia repair      right inguinal  . Esophagogastroduodenoscopy (egd) with esophageal dilation N/A 08/29/2013    Procedure: ESOPHAGOGASTRODUODENOSCOPY (EGD) WITH ESOPHAGEAL DILATION;  Surgeon: West Bali, MD;  Location: [facility];  Service: Endoscopy;  Laterality: N/A;  1:00 PM-rescheduled to 930 Leigh Ann notified pt    Medications:  HOME MEDS: Prior to Admission medications   Medication Sig Start Date End Date Taking? Authorizing Provider  acyclovir (ZOVIRAX) 400 MG tablet  Take 400 mg by mouth every morning.    Yes Historical Provider, MD  azaTHIOprine (IMURAN) 50 MG tablet Take 150 mg by mouth every morning.    Yes Historical Provider, MD  Calcium Carbonate-Vitamin D (CALCIUM 600+D) 600-200 MG-UNIT  TABS Take 2 tablets by mouth every morning.    Yes Historical Provider, MD  diphenoxylate-atropine (LOMOTIL) 2.5-0.025 MG per tablet Take 1 tablet by mouth every 4 (four) hours as needed for diarrhea or loose stools. For loose stools 03/30/14  Yes Historical Provider, MD  metFORMIN (GLUCOPHAGE-XR) 500 MG 24 hr tablet Take 500 mg by mouth 2 (two) times daily.  06/30/13  Yes Historical Provider, MD  Omega-3 Fatty Acids (FISH OIL) 1200 MG CAPS Take 2 capsules by mouth every morning.    Yes Historical Provider, MD  omeprazole (PRILOSEC) 20 MG capsule Take 20 mg by mouth 2 (two) times daily.  07/31/13  Yes West Bali, MD  ondansetron (ZOFRAN-ODT) 4 MG disintegrating tablet Take 1 tablet by mouth every 4 (four) hours as needed. For nausea/vomiting 03/30/14  Yes Historical Provider, MD  pyridostigmine (MESTINON) 60 MG tablet Take 60 mg by mouth every morning.    Yes Historical Provider, MD  SYNTHROID 137 MCG tablet Take 137 mcg by mouth daily before breakfast.  07/03/13  Yes Historical Provider, MD  valsartan (DIOVAN) 80 MG tablet Take 80 mg by mouth every morning.    Yes Historical Provider, MD  vitamin C (ASCORBIC ACID) 500 MG tablet Take 1,000 mg by mouth every morning.    Yes Historical Provider, MD  vitamin E 400 UNIT capsule Take 800 Units by mouth every morning.    Yes Historical Provider, MD  Saw Palmetto, Serenoa repens, (SAW PALMETTO BERRIES PO) Take 1 tablet by mouth daily.     Historical Provider, MD     Allergies:  Allergies  Allergen Reactions  . Levofloxacin Anaphylaxis  . Tequin [Gatifloxacin] Anaphylaxis  . Aminoglycosides Other (See Comments)    REACTION: Myasthenia Gravis (MG) Medication Alert  . Avelox [Moxifloxacin Hcl In Nacl] Other (See Comments)    REACTION: FLUOROQUINOLONE ANTIBIOTICS: Myasthenia Gravis (MG) Medication Alert  . Botox [Onabotulinumtoxina] Other (See Comments)    REACTION: Myasthenia Gravis (MG) Medication Alert  . Botulinum Toxins Other (See Comments)     REACTION: Myasthenia Gravis (MG) Medication Alert  . Calcium Channel Blockers Other (See Comments)    (BLOOD PRESSURE MEDICATION) REACTION: Myasthenia Gravis (MG) Medication Alert  . Ciprofloxacin Other (See Comments)    REACTION: FLUOROQUINOLONE ANTIBIOTICS: Myasthenia Gravis (MG) Medication Alert  . Curare [Tubocurarine] Other (See Comments)    (Usually only used during surgery)  REACTION: Myasthenia Gravis (MG) Medication Alert  . Dysport [Abobotulinumtoxina] Other (See Comments)    REACTION: Myasthenia Gravis (MG) Medication Alert  . Erythromycin Other (See Comments)    REACTION: Myasthenia Gravis (MG) Medication Alert  . Factive [Gemifloxacin] Other (See Comments)    REACTION: FLUOROQUINOLONE ANTIBIOTICS: Myasthenia Gravis (MG) Medication Alert  . Floxin [Ofloxacin] Other (See Comments)    REACTION: FLUOROQUINOLONE ANTIBIOTICS: Myasthenia Gravis (MG) Medication Alert  . Gentamycin [Gentamicin] Other (See Comments)    REACTION:Myasthenia Gravis (MG) Medication Alert  . Interferons Other (See Comments)    REACTION: Myasthenia Gravis (MG) Medication Alert  . Kanamycin Other (See Comments)    REACTION: Myasthenia Gravis (MG) Medication Alert  . Ketek [Telithromycin] Other (See Comments)    REACTION: Myasthenia Gravis (MG) Medication Alert  . Levaquin [Levofloxacin In D5w] Other (See Comments)    REACTION: FLUOROQUINOLONE ANTIBIOTICS:  Myasthenia Gravis (MG) Medication Alert  . Macrolides And Ketolides Other (See Comments)    REACTION: Myasthenia Gravis (MG) Medication Alert  . Magnesium-Containing Compounds   . Myobloc [Rimabotulinumtoxinb] Other (See Comments)    REACTION: Myasthenia Gravis (MG) Medication Alert  . Neomycin Other (See Comments)    REACTION: Myasthenia Gravis (MG) Medication Alert  . Noroxin [Norfloxacin] Other (See Comments)    REACTION: FLUOROQUINOLONE ANTIBIOTICS: Myasthenia Gravis (MG) Medication Alert  . Other Other (See Comments)    MAGNESIUM SALTS: Milk of  Magnesia, some antacids (Maalox, Mylanta) REACTION: Myasthenia Gravis (MG) Medication Alerta  . Penicillamine Other (See Comments)    D-PENICILLAMINE *DO NOT CONFUSE WITH PENICILLIN*  REACTION: Myasthenia Gravis (MG) Medication Alert  . Procainamide Other (See Comments)    REACTION: Myasthenia Gravis (MG) Medication Alert  . Propranolol Other (See Comments)    REACTION: Myasthenia Gravis (MG) Medication Alert  . Quinidine Other (See Comments)    REACTION: Myasthenia Gravis (MG) Medication Alert  . Quinine Derivatives Other (See Comments)    REACTION: Myasthenia Gravis (MG) Medication Alert  . Streptomycin Other (See Comments)    REACTION:  Myasthenia Gravis (MG) Medication Alert  . Timolol Other (See Comments)    REACTION: Myasthenia Gravis (MG) Medication Alert  . Tobramycin     REACTION: Myasthenia Gravis (MG) Medication Alert  . Xeomin [Incobotulinumtoxina] Other (See Comments)    REACTION: Myasthenia Gravis (MG) Medication Alert  . Zithromax [Azithromycin] Other (See Comments)    REACTION: Myasthenia Gravis (MG) Medication Alert    Social History:   reports that he has never smoked. He has never used smokeless tobacco. He reports that he does not drink alcohol or use illicit drugs.  Family History: No family history on file.   Physical Exam: Filed Vitals:   04/01/14 1804 04/01/14 2127 04/01/14 2127 04/01/14 2128  BP: 134/67 124/67 124/78 116/74  Pulse: 97 84 89 86  Temp: 99.8 F (37.7 C)     TempSrc: Oral     Resp: 18     SpO2: 98%      Blood pressure 116/74, pulse 86, temperature 99.8 F (37.7 C), temperature source Oral, resp. rate 18, SpO2 98.00%.  GEN:  Pleasant patient lying in the stretcher in no acute distress; cooperative with exam. PSYCH:  alert and oriented x4; does not appear anxious or depressed; affect is appropriate. HEENT: Mucous membranes pink and anicteric; PERRLA; EOM intact; no cervical lymphadenopathy nor thyromegaly or carotid bruit; no JVD;  There were no stridor. Neck is very supple. Breasts:: Not examined CHEST WALL: No tenderness CHEST: Normal respiration, clear to auscultation bilaterally.  HEART: Regular rate and rhythm.  There are no murmur, rub, or gallops.   BACK: No kyphosis or scoliosis; no CVA tenderness ABDOMEN: soft and tender LLQ.  no masses, no organomegaly, normal abdominal bowel sounds; no pannus; no intertriginous candida. There is no rebound and no distention. Rectal Exam: Not done EXTREMITIES: No bone or joint deformity; age-appropriate arthropathy of the hands and knees; no edema; no ulcerations.  There is no calf tenderness. Genitalia: not examined PULSES: 2+ and symmetric SKIN: Normal hydration no rash or ulceration CNS: Cranial nerves 2-12 grossly intact no focal lateralizing neurologic deficit.  Speech is fluent; uvula elevated with phonation, facial symmetry and tongue midline. DTR are normal bilaterally, cerebella exam is intact, barbinski is negative and strengths are equaled bilaterally.  No sensory loss.   Labs on Admission:  Basic Metabolic Panel:  Recent Labs Lab 04/01/14 2004  NA 139  K 3.6*  CL 100  CO2 28  GLUCOSE 96  BUN 13  CREATININE 0.94  CALCIUM 9.0   Liver Function Tests:  Recent Labs Lab 04/01/14 2004  AST 21  ALT 27  ALKPHOS 80  BILITOT 1.1  PROT 6.7  ALBUMIN 3.5   No results found for this basename: LIPASE, AMYLASE,  in the last 168 hours No results found for this basename: AMMONIA,  in the last 168 hours CBC:  Recent Labs Lab 04/01/14 2004  WBC 9.5  NEUTROABS 6.9  HGB 13.6  HCT 38.1*  MCV 88.8  PLT 178   Cardiac Enzymes: No results found for this basename: CKTOTAL, CKMB, CKMBINDEX, TROPONINI,  in the last 168 hours  CBG: No results found for this basename: GLUCAP,  in the last 168 hours   Radiological Exams on Admission: Ct Abdomen Pelvis Wo Contrast  04/01/2014   CLINICAL DATA:  Fever and diarrhea for 6 days.  EXAM: CT ABDOMEN AND PELVIS WITHOUT  CONTRAST  TECHNIQUE: Multidetector CT imaging of the abdomen and pelvis was performed following the standard protocol without IV contrast.  COMPARISON:  DG RETROGRADE PYELOGRAM dated 07/16/2009; CT PELVIS W/O CM dated 05/19/2009  FINDINGS: Surgical absence of the gallbladder. Small accessory spleen. The unenhanced appearance of the liver, spleen, pancreas, adrenal glands, kidneys, abdominal aorta, inferior vena cava, and retroperitoneal lymph nodes is unremarkable. The stomach, small bowel, and colon are decompressed. No free air or free fluid in the abdomen.  Pelvis: Diverticulosis of the sigmoid colon. Colonic wall thickening and infiltration around the sigmoid region consistent with diverticulitis. No abscess. There is a small left inguinal hernia containing fat. No free or loculated pelvic fluid collections. Appendix appears to be surgically absent. No significant pelvic lymphadenopathy. The bladder is distended without wall thickening. Degenerative changes in the spine.  IMPRESSION: Diverticulosis with inflammatory changes around the sigmoid colon consistent with diverticulitis. No abscess. Small left inguinal hernia containing fat.   Electronically Signed   By: Burman NievesWilliam  Stevens M.D.   On: 04/01/2014 22:02   Assessment/Plan Present on Admission:  . Diverticulitis large intestine . Diarrhea . Myasthenia gravis . Hypertension  PLAN:  Diverticulitis usually doesn't present as severe diarrhea, but it can.  Will Tx him with IV Unasyn.  It should cover adequate anaerobics, so we will not need FLagyl.  I will send stool for diarrhea studies.  Since he is about due for colonoscopy, and I told him colon cancer can mimic diverticulitis, he should go ahead with his colonoscopy as soon as he is better.  For his MG, will continue his meds including Mestinon and Imuran.  He is stable, full code, [facility]  Thank you for allowing me to partake in the care of your nice patient.  Other plans  as per orders.  Code Status: FULL CODE.   Houston SirenPeter Yamilette Garretson, MD. [facility] Pager [phone] 7pm to 7am.  04/01/2014, 11:51 PM

## 2014-04-02 ENCOUNTER — Encounter (HOSPITAL_COMMUNITY): Payer: Self-pay | Admitting: Gastroenterology

## 2014-04-02 ENCOUNTER — Telehealth: Payer: Self-pay | Admitting: Gastroenterology

## 2014-04-02 DIAGNOSIS — K5732 Diverticulitis of large intestine without perforation or abscess without bleeding: Principal | ICD-10-CM

## 2014-04-02 DIAGNOSIS — G7000 Myasthenia gravis without (acute) exacerbation: Secondary | ICD-10-CM

## 2014-04-02 DIAGNOSIS — Z79899 Other long term (current) drug therapy: Secondary | ICD-10-CM

## 2014-04-02 LAB — CBC
HCT: 38.9 % — ABNORMAL LOW (ref 39.0–52.0)
Hemoglobin: 13.7 g/dL (ref 13.0–17.0)
MCH: 31.8 pg (ref 26.0–34.0)
MCHC: 35.2 g/dL (ref 30.0–36.0)
MCV: 90.3 fL (ref 78.0–100.0)
Platelets: 168 10*3/uL (ref 150–400)
RBC: 4.31 MIL/uL (ref 4.22–5.81)
RDW: 13.9 % (ref 11.5–15.5)
WBC: 6.2 10*3/uL (ref 4.0–10.5)

## 2014-04-02 LAB — BASIC METABOLIC PANEL
BUN: 10 mg/dL (ref 6–23)
CO2: 30 mEq/L (ref 19–32)
Calcium: 8.6 mg/dL (ref 8.4–10.5)
Chloride: 102 mEq/L (ref 96–112)
Creatinine, Ser: 0.83 mg/dL (ref 0.50–1.35)
GFR calc Af Amer: >90 mL/min (ref >90)
GFR calc non Af Amer: >90 mL/min (ref >90)
Glucose, Bld: 179 mg/dL — ABNORMAL HIGH (ref 70–99)
Potassium: 4.2 mEq/L (ref 3.7–5.3)
Sodium: 141 mEq/L (ref 137–147)

## 2014-04-02 LAB — GLUCOSE, CAPILLARY
Glucose-Capillary: 109 mg/dL — ABNORMAL HIGH (ref 70–99)
Glucose-Capillary: 128 mg/dL — ABNORMAL HIGH (ref 70–99)

## 2014-04-02 MED ORDER — ONDANSETRON HCL 4 MG PO TABS: 4.0000 mg | ORAL_TABLET | Freq: Four times a day (QID) | ORAL | Status: DC | PRN

## 2014-04-02 MED ORDER — OMEGA-3-ACID ETHYL ESTERS 1 G PO CAPS
1.0000 g | ORAL_CAPSULE | Freq: Every day | ORAL | Status: DC
  Administered 2014-04-02 – 2014-04-03 (×2): 1 g via ORAL
  Filled 2014-04-02 (×2): qty 1

## 2014-04-02 MED ORDER — INSULIN ASPART 100 UNIT/ML ~~LOC~~ SOLN: 0.0000 [IU] | SUBCUTANEOUS | Status: DC

## 2014-04-02 MED ORDER — AZATHIOPRINE 50 MG PO TABS
150.0000 mg | ORAL_TABLET | Freq: Every morning | ORAL | Status: DC
  Administered 2014-04-02 – 2014-04-03 (×2): 150 mg via ORAL
  Filled 2014-04-02 (×5): qty 3

## 2014-04-02 MED ORDER — SODIUM CHLORIDE 0.9 % IJ SOLN
3.0000 mL | Freq: Two times a day (BID) | INTRAMUSCULAR | Status: DC
  Administered 2014-04-03: 3 mL via INTRAVENOUS

## 2014-04-02 MED ORDER — PYRIDOSTIGMINE BROMIDE 60 MG PO TABS
60.0000 mg | ORAL_TABLET | Freq: Every morning | ORAL | Status: DC
  Administered 2014-04-02 – 2014-04-03 (×2): 60 mg via ORAL
  Filled 2014-04-02 (×5): qty 1

## 2014-04-02 MED ORDER — ONDANSETRON HCL 4 MG/2ML IJ SOLN: 4.0000 mg | Freq: Four times a day (QID) | INTRAMUSCULAR | Status: DC | PRN

## 2014-04-02 MED ORDER — ACYCLOVIR 800 MG PO TABS
400.0000 mg | ORAL_TABLET | Freq: Every day | ORAL | Status: DC
  Administered 2014-04-02 – 2014-04-03 (×2): 400 mg via ORAL
  Filled 2014-04-02 (×6): qty 1

## 2014-04-02 MED ORDER — KCL IN DEXTROSE-NACL 20-5-0.9 MEQ/L-%-% IV SOLN
INTRAVENOUS | Status: DC
  Administered 2014-04-02: 02:00:00 via INTRAVENOUS

## 2014-04-02 MED ORDER — HEPARIN SODIUM (PORCINE) 5000 UNIT/ML IJ SOLN: 5000.0000 [IU] | Freq: Three times a day (TID) | INTRAMUSCULAR | Status: DC

## 2014-04-02 MED ORDER — PANTOPRAZOLE SODIUM 40 MG PO TBEC
80.0000 mg | DELAYED_RELEASE_TABLET | Freq: Every day | ORAL | Status: DC
  Administered 2014-04-02: 40 mg via ORAL
  Administered 2014-04-03: 80 mg via ORAL
  Filled 2014-04-02 (×3): qty 2

## 2014-04-02 MED ORDER — SODIUM CHLORIDE 0.9 % IV SOLN
INTRAVENOUS | Status: AC
  Filled 2014-04-02: qty 1.5

## 2014-04-02 MED ORDER — SODIUM CHLORIDE 0.9 % IV SOLN
1000.0000 mL | INTRAVENOUS | Status: DC
  Administered 2014-04-02 (×2): 1000 mL via INTRAVENOUS

## 2014-04-02 MED ORDER — AMPICILLIN-SULBACTAM SODIUM 1.5 (1-0.5) G IJ SOLR
1.5000 g | Freq: Four times a day (QID) | INTRAMUSCULAR | Status: DC
  Administered 2014-04-02 – 2014-04-03 (×6): 1.5 g via INTRAVENOUS
  Filled 2014-04-02 (×19): qty 1.5

## 2014-04-02 MED ORDER — MORPHINE SULFATE 2 MG/ML IJ SOLN: 2.0000 mg | INTRAMUSCULAR | Status: DC | PRN

## 2014-04-02 MED ORDER — LEVOTHYROXINE SODIUM 137 MCG PO TABS
137.0000 ug | ORAL_TABLET | Freq: Every day | ORAL | Status: DC
  Administered 2014-04-02 – 2014-04-03 (×2): 137 ug via ORAL
  Filled 2014-04-02 (×5): qty 1

## 2014-04-02 NOTE — Progress Notes (Signed)
I spoke with Dr Alyson Reedy, she works with Dr Budd Palmer, patient primary neurologist. They think is ok to continue with Imuran as long as patient is on antibiotics.  Corey Bonet, Md.

## 2014-04-02 NOTE — Care Management Utilization Note (Signed)
UR completed 

## 2014-04-02 NOTE — Telephone Encounter (Signed)
Needs OV with [facility] in 4 weeks to schedule colonoscopy (f/u diverticulitis).

## 2014-04-02 NOTE — Progress Notes (Signed)
[facility] PROGRESS NOTE  Corey CruzDavid W Hernandez ZOX:[id number] DOB: 08-Sep-1951 DOA: 04/01/2014 PCP: Cassell SmilesFUSCO,LAWRENCE J., MD  Assessment/Plan: 1-Diverticulitis; no significant leukocytosis. Diarrhea resolved. Pain improved. I will continue with Unasyn. I will consult patient gastroenterologist. Patient on inmunesupppresive medications. Continue with clear diet. GI pathogen ordered.   2-Myasthenia gravis; I will continue with imuran for now. Gastroenterologist consulted. Should we continue with imuran. I left message to patient' s primary neurologist.   3-HTN; hold Diovan.   Code Status: full code.  Family Communication: Care discussed with patient and wife.  Disposition Plan: Remain inpatient.    Consultants:  GI.   Procedures:  none  Antibiotics:  Unasyn 5-13  HPI/Subjective: No more diarrhea. He had MB this am. He relates very mild abdominal pain.   Objective: Filed Vitals:   04/02/14 0423  BP: 111/67  Pulse: 62  Temp: 97.6 F (36.4 C)  Resp: 18    Intake/Output Summary (Last 24 hours) at 04/02/14 0919 Last data filed at 04/01/14 2230  Gross per 24 hour  Intake   1000 ml  Output      0 ml  Net   1000 ml   Filed Weights   04/02/14 0052  Weight: 94 kg (207 lb 3.7 oz)    Exam:   General:  No distress.   Cardiovascular: S 1, S 2 RRR  Respiratory: CTA  Abdomen: Bs present, soft, NT  Musculoskeletal: trace edema.   Data Reviewed: Basic Metabolic Panel:  Recent Labs Lab 04/01/14 2004  NA 139  K 3.6*  CL 100  CO2 28  GLUCOSE 96  BUN 13  CREATININE 0.94  CALCIUM 9.0   Liver Function Tests:  Recent Labs Lab 04/01/14 2004  AST 21  ALT 27  ALKPHOS 80  BILITOT 1.1  PROT 6.7  ALBUMIN 3.5   No results found for this basename: LIPASE, AMYLASE,  in the last 168 hours No results found for this basename: AMMONIA,  in the last 168 hours CBC:  Recent Labs Lab 04/01/14 2004  WBC 9.5  NEUTROABS 6.9  HGB 13.6  HCT 38.1*  MCV 88.8  PLT  178   Cardiac Enzymes: No results found for this basename: CKTOTAL, CKMB, CKMBINDEX, TROPONINI,  in the last 168 hours BNP (last 3 results) No results found for this basename: PROBNP,  in the last 8760 hours CBG:  Recent Labs Lab 04/02/14 0421 04/02/14 0720  GLUCAP 109* 128*    No results found for this or any previous visit (from the past 240 hour(s)).   Studies: Ct Abdomen Pelvis Wo Contrast  04/01/2014   CLINICAL DATA:  Fever and diarrhea for 6 days.  EXAM: CT ABDOMEN AND PELVIS WITHOUT CONTRAST  TECHNIQUE: Multidetector CT imaging of the abdomen and pelvis was performed following the standard protocol without IV contrast.  COMPARISON:  DG RETROGRADE PYELOGRAM dated 07/16/2009; CT PELVIS W/O CM dated 05/19/2009  FINDINGS: Surgical absence of the gallbladder. Small accessory spleen. The unenhanced appearance of the liver, spleen, pancreas, adrenal glands, kidneys, abdominal aorta, inferior vena cava, and retroperitoneal lymph nodes is unremarkable. The stomach, small bowel, and colon are decompressed. No free air or free fluid in the abdomen.  Pelvis: Diverticulosis of the sigmoid colon. Colonic wall thickening and infiltration around the sigmoid region consistent with diverticulitis. No abscess. There is a small left inguinal hernia containing fat. No free or loculated pelvic fluid collections. Appendix appears to be surgically absent. No significant pelvic lymphadenopathy. The bladder is distended without wall thickening. Degenerative changes  in the spine.  IMPRESSION: Diverticulosis with inflammatory changes around the sigmoid colon consistent with diverticulitis. No abscess. Small left inguinal hernia containing fat.   Electronically Signed   By: Burman Nieves M.D.   On: 04/01/2014 22:02    Scheduled Meds: . acyclovir  400 mg Oral QAC breakfast  . ampicillin-sulbactam (UNASYN) IV  1.5 g Intravenous 4 times per day  . azaTHIOprine  150 mg Oral q morning - 10a  . heparin  5,000 Units  Subcutaneous 3 times per day  . insulin aspart  0-9 Units Subcutaneous 6 times per day  . levothyroxine  137 mcg Oral QAC breakfast  . omega-3 acid ethyl esters  1 g Oral Daily  . pantoprazole  80 mg Oral Daily  . pyridostigmine  60 mg Oral q morning - 10a  . sodium chloride  3 mL Intravenous Q12H   Continuous Infusions: . sodium chloride 1,000 mL (04/01/14 2231)  . dextrose 5 % and 0.9 % NaCl with KCl 20 mEq/L 100 mL/hr at 04/02/14 0154    Principal Problem:   Diverticulitis large intestine Active Problems:   Myasthenia gravis   Hypertension   Diarrhea    Time spent: 30 minutes.     Belkys A Regalado  [facility] Pager [phone]. If 7PM-7AM, please contact night-coverage at [email or web address], password [facility] 04/02/2014, 9:19 AM  LOS: 1 day

## 2014-04-02 NOTE — Care Management Note (Signed)
    Page 1 of 1   04/02/2014     1:53:40 PM CARE MANAGEMENT NOTE 04/02/2014  Patient:  [name], [name]   Account Number:  [id number]  Date Initiated:  04/02/2014  Documentation initiated by:  Rosemary Holms  Subjective/Objective Assessment:   Pt from home with wife. No HH or DME needs anticipated     Action/Plan:   Anticipated DC Date:     Anticipated DC Plan:  HOME/SELF CARE      DC Planning Services  CM consult      Choice offered to / List presented to:             Status of service:  Completed, signed off Medicare Important Message given?   (If response is "NO", the following Medicare IM given date fields will be blank) Date Medicare IM given:   Date Additional Medicare IM given:    Discharge Disposition:    Per UR Regulation:    If discussed at Long Length of Stay Meetings, dates discussed:    Comments:  04/02/14 Rosemary Holms RN BSN CM

## 2014-04-02 NOTE — Consult Note (Signed)
Referring Provider: Alba Cory, MD Primary Care Physician:  Cassell Smiles., MD Primary Gastroenterologist:  Jonette Eva, MD   Reason for Consultation:  Diverticulitis, should we continue Imuran  HPI: [name] is a 63 y.o. male admitted with diverticulitis based on CT. He has h/o myasthenia gravis s/p thymectomy, on Mestinon and Imuran for over 10 years. Presented with several day h/o diarrhea, mild suprapubic abdominal pain. Decided to come to the emergency department due to fever. Had been seen by PCP and given Lomotil and Zofran with symptomatic improvement. 3 days prior to ER evaluation he noted afternoon/evening low-grade fevers however the day of presentation temperature was 101.3 which concerned him. Denies any ill contacts. No travel abroad. Denies any recent antibiotic use. He had a CT scan with oral contrast only which showed diverticulosis with inflammatory changes around the sigmoid colon c/w diverticulitis. No abscess. Patient had documented diverticulitis back in 2003 by CT scan.    Currently being managed with Unasyn 1.5 g every 6 hours. Feeling better today. 3 bowel movements thus far, stools with some consistency. Denies any melena rectal bleeding. Continues to have vague suprapubic pain worse with full bladder. No fever today. No N/V.  Colonoscopy 2005 by Dr. Lovell Sheehan, patient reports was normal. Told to come back in 10 years. Myasthenia gravis gravis managed by Dr[facility]Jule at [facility].   Prior to Admission medications   Medication Sig Start Date End Date Taking? Authorizing Provider  acyclovir (ZOVIRAX) 400 MG tablet Take 400 mg by mouth every morning.    Yes Historical Provider, MD  azaTHIOprine (IMURAN) 50 MG tablet Take 150 mg by mouth every morning.    Yes Historical Provider, MD  Calcium Carbonate-Vitamin D (CALCIUM 600+D) 600-200 MG-UNIT TABS Take 2 tablets by mouth every morning.    Yes Historical Provider, MD  diphenoxylate-atropine (LOMOTIL) 2.5-0.025 MG per  tablet Take 1 tablet by mouth every 4 (four) hours as needed for diarrhea or loose stools. For loose stools 03/30/14  Yes Historical Provider, MD  metFORMIN (GLUCOPHAGE-XR) 500 MG 24 hr tablet Take 500 mg by mouth 2 (two) times daily.  06/30/13  Yes Historical Provider, MD  Omega-3 Fatty Acids (FISH OIL) 1200 MG CAPS Take 2 capsules by mouth every morning.    Yes Historical Provider, MD  omeprazole (PRILOSEC) 20 MG capsule Take 20 mg by mouth 2 (two) times daily.  07/31/13  Yes [place] Bali, MD  ondansetron (ZOFRAN-ODT) 4 MG disintegrating tablet Take 1 tablet by mouth every 4 (four) hours as needed. For nausea/vomiting 03/30/14  Yes Historical Provider, MD  pyridostigmine (MESTINON) 60 MG tablet Take 60 mg by mouth every morning.    Yes Historical Provider, MD  SYNTHROID 137 MCG tablet Take 137 mcg by mouth daily before breakfast.  07/03/13  Yes Historical Provider, MD  valsartan (DIOVAN) 80 MG tablet Take 80 mg by mouth every morning.    Yes Historical Provider, MD  vitamin C (ASCORBIC ACID) 500 MG tablet Take 1,000 mg by mouth every morning.    Yes Historical Provider, MD  vitamin E 400 UNIT capsule Take 800 Units by mouth every morning.    Yes Historical Provider, MD  Saw Palmetto, Serenoa repens, (SAW PALMETTO BERRIES PO) Take 1 tablet by mouth daily.     Historical Provider, MD    Current Facility-Administered Medications  Medication Dose Route Frequency Provider Last Rate Last Dose  . 0.9 %  sodium chloride infusion  1,000 mL Intravenous Continuous Belkys A Regalado, MD 100 mL/hr at  04/02/14 1050 1,000 mL at 04/02/14 1050  . acyclovir (ZOVIRAX) tablet 400 mg  400 mg Oral QAC breakfast [place] Siren, MD   400 mg at 04/02/14 1058  . ampicillin-sulbactam (UNASYN) 1.5 g in sodium chloride 0.9 % 50 mL IVPB  1.5 g Intravenous 4 times per day Houston Siren, MD   1.5 g at 04/02/14 1252  . azaTHIOprine (IMURAN) tablet 150 mg  150 mg Oral q morning - 10a Houston Siren, MD   150 mg at 04/02/14 1059  . heparin injection  5,000 Units  5,000 Units Subcutaneous 3 times per day Houston Siren, MD      . levothyroxine (SYNTHROID, LEVOTHROID) tablet 137 mcg  137 mcg Oral QAC breakfast Houston Siren, MD   137 mcg at 04/02/14 1100  . morphine 2 MG/ML injection 2 mg  2 mg Intravenous Q2H PRN Houston Siren, MD      . omega-3 acid ethyl esters (LOVAZA) capsule 1 g  1 g Oral Daily Houston Siren, MD   1 g at 04/02/14 1100  . ondansetron (ZOFRAN) tablet 4 mg  4 mg Oral Q6H PRN Houston Siren, MD       Or  . ondansetron [facility]) injection 4 mg  4 mg Intravenous Q6H PRN Houston Siren, MD      . pantoprazole (PROTONIX) EC tablet 80 mg  80 mg Oral Daily Houston Siren, MD   40 mg at 04/02/14 1100  . pyridostigmine (MESTINON) tablet 60 mg  60 mg Oral q morning - 10a Houston Siren, MD   60 mg at 04/02/14 1100  . sodium chloride 0.9 % injection 3 mL  3 mL Intravenous Q12H Houston Siren, MD        Allergies as of 04/01/2014 - Review Complete 04/01/2014  Allergen Reaction Noted  . Levofloxacin Anaphylaxis 03/01/2011  . Tequin [gatifloxacin] Anaphylaxis 06/07/2012  . Aminoglycosides Other (See Comments) 06/07/2012  . Avelox [moxifloxacin hcl in nacl] Other (See Comments) 06/07/2012  . Botox [onabotulinumtoxina] Other (See Comments) 06/07/2012  . Botulinum toxins Other (See Comments) 06/07/2012  . Calcium channel blockers Other (See Comments) 06/07/2012  . Ciprofloxacin Other (See Comments) 06/07/2012  . Curare [tubocurarine] Other (See Comments) 06/07/2012  . Dysport [abobotulinumtoxina] Other (See Comments) 06/07/2012  . Erythromycin Other (See Comments) 06/07/2012  . Factive [gemifloxacin] Other (See Comments) 06/07/2012  . Floxin [ofloxacin] Other (See Comments) 06/07/2012  . Gentamycin [gentamicin] Other (See Comments) 06/07/2012  . Interferons Other (See Comments) 06/07/2012  . Kanamycin Other (See Comments) 06/07/2012  . Ketek [telithromycin] Other (See Comments) 06/07/2012  . Levaquin [levofloxacin in d5w] Other (See Comments) 06/07/2012  . Macrolides and ketolides  Other (See Comments) 06/07/2012  . Magnesium-containing compounds  04/01/2014  . Myobloc [rimabotulinumtoxinb] Other (See Comments) 06/07/2012  . Neomycin Other (See Comments) 06/07/2012  . Noroxin [norfloxacin] Other (See Comments) 06/07/2012  . Other Other (See Comments) 06/07/2012  . Penicillamine Other (See Comments) 06/07/2012  . Procainamide Other (See Comments) 06/07/2012  . Propranolol Other (See Comments) 06/07/2012  . Quinidine Other (See Comments) 06/07/2012  . Quinine derivatives Other (See Comments) 06/07/2012  . Streptomycin Other (See Comments) 06/07/2012  . Timolol Other (See Comments) 06/07/2012  . Tobramycin  06/07/2012  . Xeomin [incobotulinumtoxina] Other (See Comments) 06/07/2012  . Zithromax [azithromycin] Other (See Comments) 06/07/2012    Past Medical History  Diagnosis Date  . GERD (gastroesophageal reflux disease)   . Esophageal stricture SEP 2011    FOOD IMPACTION-->SAV DIL 16 MM  . Myasthenia gravis   .  Hypertension   . Thyroid disease   . Kidney stone   . Deafness in right ear   . Candida esophagitis OCT 2014    Past Surgical History  Procedure Laterality Date  . Upper gastrointestinal endoscopy  SEP 2011    SAV DIL  . Kidney stone surgery    . Esophagogastroduodenoscopy N/A 07/25/2013    Shallow ulcer in the cardia, mid esophageal web, stricture at GE junction  . Cholecystectomy    . Tonsillectomy    . Thymectomy    . Hernia repair      right inguinal  . Esophagogastroduodenoscopy (egd) with esophageal dilation N/A 08/29/2013    Candida esophagitis, small hiatal hernia, moderate nonerosive gastritis, mid esophageal web  . Colonoscopy  2005    Dr. Lovell Sheehan  . Palate surgery         . Nasal septum surgery      Family History  Problem Relation Age of Onset  . Colon cancer Neg Hx   . Liver disease Father     age 1, deceased    History   Social History  . Marital Status: Married    Spouse Name: N/A    Number of Children: N/A  .  Years of Education: N/A   Occupational History  . Not on file.   Social History Main Topics  . Smoking status: Never Smoker   . Smokeless tobacco: Never Used  . Alcohol Use: No  . Drug Use: No  . Sexual Activity: Yes    Partners: Female    Pharmacist, hospital Protection: None     Comment: spouse   Other Topics Concern  . Not on file   Social History Narrative  . No narrative on file     ROS:  General: 7 pound weight loss with illness. See history of present illness  Eyes: Negative for vision changes.  ENT: Negative for hoarseness, difficulty swallowing , nasal congestion. CV: Negative for chest pain, angina, palpitations, dyspnea on exertion, peripheral edema.  Respiratory: Negative for dyspnea at rest, dyspnea on exertion, cough, sputum, wheezing.  GI: See history of present illness. GU:  Negative for dysuria, hematuria, urinary incontinence, urinary frequency, nocturnal urination.  MS: Negative for joint pain, low back pain.  Derm: Negative for rash or itching.  Neuro: Negative for weakness, abnormal sensation, seizure, frequent headaches, memory loss, confusion.  Psych: Negative for anxiety, depression, suicidal ideation, hallucinations.  Endo: Negative for unusual weight change.  Heme: Negative for bruising or bleeding. Allergy: Negative for rash or hives.       Physical Examination: Vital signs in last 24 hours: Temp:  [97.6 F (36.4 C)-99.8 F (37.7 C)] 97.6 F (36.4 C) (05/14 0423) Pulse Rate:  [62-97] 62 (05/14 0423) Resp:  [18] 18 (05/14 0423) BP: (111-134)/(67-78) 111/67 mmHg (05/14 0423) SpO2:  [98 %-99 %] 98 % (05/14 0423) Weight:  [207 lb 3.7 oz (94 kg)] 207 lb 3.7 oz (94 kg) (05/14 0052) Last BM Date: 04/02/14  General: Well-nourished, well-developed in no acute distress.  Head: Normocephalic, atraumatic.   Eyes: Conjunctiva pink, no icterus. Mouth: Oropharyngeal mucosa moist and pink , no lesions erythema or exudate. Neck: Supple without thyromegaly,  masses, or lymphadenopathy.  Lungs: Clear to auscultation bilaterally.  Heart: Regular rate and rhythm, no murmurs rubs or gallops.  Abdomen: Bowel sounds are normal, mild to moderate suprapubic tenderness, nondistended, no hepatosplenomegaly or masses, no abdominal bruits or    hernia , no rebound or guarding.   Rectal: Not performed  Extremities: No lower extremity edema, clubbing, deformity.  Neuro: Alert and oriented x 4 , grossly normal neurologically.  Skin: Warm and dry, no rash or jaundice.   Psych: Alert and cooperative, normal mood and affect.        Intake/Output from previous day: 05/13 0701 - 05/14 0700 In: 1000 [I.V.:1000] Out: -  Intake/Output this shift: Total I/O In: 360 [P.O.:360] Out: 500 [Urine:500]  Lab Results: CBC  Recent Labs  04/01/14 2004 04/02/14 1014  WBC 9.5 6.2  HGB 13.6 13.7  HCT 38.1* 38.9*  MCV 88.8 90.3  PLT 178 168   BMET  Recent Labs  04/01/14 2004 04/02/14 1014  NA 139 141  K 3.6* 4.2  CL 100 102  CO2 28 30  GLUCOSE 96 179*  BUN 13 10  CREATININE 0.94 0.83  CALCIUM 9.0 8.6   LFT  Recent Labs  04/01/14 2004  BILITOT 1.1  ALKPHOS 80  AST 21  ALT 27  PROT 6.7  ALBUMIN 3.5    Lipase No results found for this basename: LIPASE,  in the last 72 hours  PT/INR No results found for this basename: LABPROT, INR,  in the last 72 hours    Imaging Studies: Ct Abdomen Pelvis Wo Contrast  04/01/2014   CLINICAL DATA:  Fever and diarrhea for 6 days.  EXAM: CT ABDOMEN AND PELVIS WITHOUT CONTRAST  TECHNIQUE: Multidetector CT imaging of the abdomen and pelvis was performed following the standard protocol without IV contrast.  COMPARISON:  DG RETROGRADE PYELOGRAM dated 07/16/2009; CT PELVIS W/O CM dated 05/19/2009  FINDINGS: Surgical absence of the gallbladder. Small accessory spleen. The unenhanced appearance of the liver, spleen, pancreas, adrenal glands, kidneys, abdominal aorta, inferior vena cava, and retroperitoneal lymph nodes is  unremarkable. The stomach, small bowel, and colon are decompressed. No free air or free fluid in the abdomen.  Pelvis: Diverticulosis of the sigmoid colon. Colonic wall thickening and infiltration around the sigmoid region consistent with diverticulitis. No abscess. There is a small left inguinal hernia containing fat. No free or loculated pelvic fluid collections. Appendix appears to be surgically absent. No significant pelvic lymphadenopathy. The bladder is distended without wall thickening. Degenerative changes in the spine.  IMPRESSION: Diverticulosis with inflammatory changes around the sigmoid colon consistent with diverticulitis. No abscess. Small left inguinal hernia containing fat.   Electronically Signed   By: Burman NievesWilliam  Stevens M.D.   On: 04/01/2014 22:02  [4 week]   Impression: 63 year old gentleman with history of myasthenia gravis who is on chronic Imuran as part of his medical regimen who presents with diarrhea, lower abdominal pain, fever. CT suggestive of diverticulitis in the sigmoid region. Patient's predominant reason for presentation was febrile illness. Diarrhea had improved. Continues to have some vague lower abdominal pain. Looks pretty good this afternoon. Has been afebrile thus far today. Given lengthy list of myasthenia gravis medication alerts, antibiotic choice of Unasyn was made. Would continue current dose given patient's clinical improvement. Transition to Augmentin as an outpatient.  Would not advise stopping Imuran in this setting.  Plan: 1. Continue Unasyn. Transition to Augmentin when he goes home. 2. Continue clear liquid diet. 3. He will need to have a colonoscopy roughly 4 weeks after he completes antibiotic therapy. We will make arrangements. 4. Once acute illness resolves, recommend fiber supplementation.   We would like to thank you for the opportunity to participate in the care of [name].    LOS: 1 day   Tiffany KocherLeslie S Lewis  04/02/2014,  3:14  PM  Attending note: Pt seen and examined; agree with above assessment and recommendations.

## 2014-04-03 ENCOUNTER — Encounter: Payer: Self-pay | Admitting: Gastroenterology

## 2014-04-03 DIAGNOSIS — R197 Diarrhea, unspecified: Secondary | ICD-10-CM

## 2014-04-03 LAB — BASIC METABOLIC PANEL
BUN: 8 mg/dL (ref 6–23)
CO2: 30 mEq/L (ref 19–32)
Calcium: 8.7 mg/dL (ref 8.4–10.5)
Chloride: 105 mEq/L (ref 96–112)
Creatinine, Ser: 0.9 mg/dL (ref 0.50–1.35)
GFR calc Af Amer: >90 mL/min (ref >90)
GFR calc non Af Amer: 89 mL/min — ABNORMAL LOW (ref >90)
Glucose, Bld: 126 mg/dL — ABNORMAL HIGH (ref 70–99)
Potassium: 3.7 mEq/L (ref 3.7–5.3)
Sodium: 144 mEq/L (ref 137–147)

## 2014-04-03 LAB — GI PATHOGEN PANEL BY PCR, STOOL
C difficile toxin A/B: NEGATIVE
Campylobacter by PCR: NEGATIVE
Cryptosporidium by PCR: NEGATIVE
E coli (ETEC) LT/ST: NEGATIVE
E coli (STEC): NEGATIVE
E coli 0157 by PCR: NEGATIVE
G lamblia by PCR: NEGATIVE
Norovirus GI/GII: NEGATIVE
Rotavirus A by PCR: NEGATIVE
Salmonella by PCR: NEGATIVE
Shigella by PCR: NEGATIVE

## 2014-04-03 LAB — CBC
HCT: 37.2 % — ABNORMAL LOW (ref 39.0–52.0)
Hemoglobin: 12.9 g/dL — ABNORMAL LOW (ref 13.0–17.0)
MCH: 31.2 pg (ref 26.0–34.0)
MCHC: 34.7 g/dL (ref 30.0–36.0)
MCV: 90.1 fL (ref 78.0–100.0)
Platelets: 178 10*3/uL (ref 150–400)
RBC: 4.13 MIL/uL — ABNORMAL LOW (ref 4.22–5.81)
RDW: 14 % (ref 11.5–15.5)
WBC: 4.9 10*3/uL (ref 4.0–10.5)

## 2014-04-03 MED ORDER — AMOXICILLIN-POT CLAVULANATE 875-125 MG PO TABS: 1.0000 | ORAL_TABLET | Freq: Two times a day (BID) | ORAL | Status: DC

## 2014-04-03 NOTE — Discharge Summary (Signed)
Physician Discharge Summary  [name] ZOX:[id number] DOB: 01-Nov-1951 DOA: 04/01/2014  PCP: Cassell Smiles., MD  Admit date: 04/01/2014 Discharge date: 04/03/2014  Time spent: 35 minutes  Recommendations for Outpatient Follow-up:  1. Follow up with PCP for follow up, for diverticulitis. Please follow up GI pathogen.  2. Consider resume Diovan if diarrhea resolved and BP increases.  3. Follow up with primary neurologist , DR Budd Palmer.  4. Follow up with Dr Darrick Penna for colonoscopy.   Discharge Diagnoses:    Diverticulitis large intestine   Myasthenia gravis   Hypertension   Diarrhea   Discharge Condition: stable.   Diet recommendation: soft diet.   Filed Weights   04/02/14 0052  Weight: 94 kg (207 lb 3.7 oz)    History of present illness:  [name] is an 63 y.o. male with hx of myasthenia gravis, on Mestinon and Imuran, s/p thymectomy, hx of GERD, right ear deafness, HTN, kidney stone, thyroid disease, presented to the ER with one day history of diarrhea, weakness, and low abdominal pain. He denied any fever,chills, nausea, vomtiing, chest pain or shortenss of breath. He has had no black or bloody stool. Wife lives at home with him hasn't been ill. He has no distant travel, and denied being on any antibiotic recently. Evaluation in the ER showed normal WBC with normal renal fx tests. He has no leukocytosis. An abdominal pelvic CT showed evidence of acute diverticulitis. He has numerous allergies to many medicine, so he was given IV Unasyn along with IV Flagyl. Hospitalist was asked to admit him for further work up and treatment. His last colonoscopy was 10 years ago, and he is due for another one soon.   Hospital Course:  1-Diverticulitis; no significant leukocytosis. Diarrhea improved.  Pain improved./ Patient received Unasyn for 3 days. Plan is to discharge him on Augmentin for 10 more days, if he tolerates lunch. He will follow up with Dr Darrick Penna for colonoscopy.  Appreciate GI evaluation. [name]  2-Myasthenia gravis; I will continue with imuran. I  spoke with Dr Alyson Reedy, she works with Dr Budd Palmer, patient primary neurologist. They think is ok to continue with Imuran.   3-HTN; hold Diovan due to diarrhea. SBP stable.    Procedures:  none  Consultations:  Dr Darrick Penna, GI  Discharge Exam: Filed Vitals:   04/03/14 0654  BP: 127/67  Pulse: 55  Temp: 97 F (36.1 C)  Resp: 18    General: No distress.  Cardiovascular: S 1, S 2 RRR Respiratory: CTA Abdomen; soft, NT , ND  Discharge Instructions You were cared for by a hospitalist during your hospital stay. If you have any questions about your discharge medications or the care you received while you were in the hospital after you are discharged, you can call the unit and asked to speak with the hospitalist on call if the hospitalist that took care of you is not available. Once you are discharged, your primary care physician will handle any further medical issues. Please note that NO REFILLS for any discharge medications will be authorized once you are discharged, as it is imperative that you return to your primary care physician (or establish a relationship with a primary care physician if you do not have one) for your aftercare needs so that they can reassess your need for medications and monitor your lab values.     Medication List    STOP taking these medications       diphenoxylate-atropine 2.5-0.025 MG per tablet  Commonly known  as:  LOMOTIL     SAW PALMETTO BERRIES PO     valsartan 80 MG tablet  Commonly known as:  DIOVAN      TAKE these medications       acyclovir 400 MG tablet  Commonly known as:  ZOVIRAX  Take 400 mg by mouth every morning.     amoxicillin-clavulanate 875-125 MG per tablet  Commonly known as:  AUGMENTIN  Take 1 tablet by mouth 2 (two) times daily.     azaTHIOprine 50 MG tablet  Commonly known as:  IMURAN  Take 150 mg by mouth every morning.     CALCIUM 600+D  600-200 MG-UNIT Tabs  Generic drug:  Calcium Carbonate-Vitamin D  Take 2 tablets by mouth every morning.     Fish Oil 1200 MG Caps  Take 2 capsules by mouth every morning.     metFORMIN 500 MG 24 hr tablet  Commonly known as:  GLUCOPHAGE-XR  Take 500 mg by mouth 2 (two) times daily.     omeprazole 20 MG capsule  Commonly known as:  PRILOSEC  Take 20 mg by mouth 2 (two) times daily.     ondansetron 4 MG disintegrating tablet  Commonly known as:  ZOFRAN-ODT  Take 1 tablet by mouth every 4 (four) hours as needed. For nausea/vomiting     pyridostigmine 60 MG tablet  Commonly known as:  MESTINON  Take 60 mg by mouth every morning.     SYNTHROID 137 MCG tablet  Generic drug:  levothyroxine  Take 137 mcg by mouth daily before breakfast.     vitamin C 500 MG tablet  Commonly known as:  ASCORBIC ACID  Take 1,000 mg by mouth every morning.     vitamin E 400 UNIT capsule  Take 800 Units by mouth every morning.       Allergies  Allergen Reactions  . Levofloxacin Anaphylaxis  . Tequin [Gatifloxacin] Anaphylaxis  . Aminoglycosides Other (See Comments)    REACTION: Myasthenia Gravis (MG) Medication Alert  . Avelox [Moxifloxacin Hcl In Nacl] Other (See Comments)    REACTION: FLUOROQUINOLONE ANTIBIOTICS: Myasthenia Gravis (MG) Medication Alert  . Botox [Onabotulinumtoxina] Other (See Comments)    REACTION: Myasthenia Gravis (MG) Medication Alert  . Botulinum Toxins Other (See Comments)    REACTION: Myasthenia Gravis (MG) Medication Alert  . Calcium Channel Blockers Other (See Comments)    (BLOOD PRESSURE MEDICATION) REACTION: Myasthenia Gravis (MG) Medication Alert  . Ciprofloxacin Other (See Comments)    REACTION: FLUOROQUINOLONE ANTIBIOTICS: Myasthenia Gravis (MG) Medication Alert  . Curare [Tubocurarine] Other (See Comments)    (Usually only used during surgery)  REACTION: Myasthenia Gravis (MG) Medication Alert  . Dysport [Abobotulinumtoxina] Other (See Comments)     REACTION: Myasthenia Gravis (MG) Medication Alert  . Erythromycin Other (See Comments)    REACTION: Myasthenia Gravis (MG) Medication Alert  . Factive [Gemifloxacin] Other (See Comments)    REACTION: FLUOROQUINOLONE ANTIBIOTICS: Myasthenia Gravis (MG) Medication Alert  . Floxin [Ofloxacin] Other (See Comments)    REACTION: FLUOROQUINOLONE ANTIBIOTICS: Myasthenia Gravis (MG) Medication Alert  . Gentamycin [Gentamicin] Other (See Comments)    REACTION:Myasthenia Gravis (MG) Medication Alert  . Interferons Other (See Comments)    REACTION: Myasthenia Gravis (MG) Medication Alert  . Kanamycin Other (See Comments)    REACTION: Myasthenia Gravis (MG) Medication Alert  . Ketek [Telithromycin] Other (See Comments)    REACTION: Myasthenia Gravis (MG) Medication Alert  . Levaquin [Levofloxacin In D5w] Other (See Comments)    REACTION: FLUOROQUINOLONE ANTIBIOTICS:  Myasthenia Gravis (MG) Medication Alert  . Macrolides And Ketolides Other (See Comments)    REACTION: Myasthenia Gravis (MG) Medication Alert  . Magnesium-Containing Compounds   . Myobloc [Rimabotulinumtoxinb] Other (See Comments)    REACTION: Myasthenia Gravis (MG) Medication Alert  . Neomycin Other (See Comments)    REACTION: Myasthenia Gravis (MG) Medication Alert  . Noroxin [Norfloxacin] Other (See Comments)    REACTION: FLUOROQUINOLONE ANTIBIOTICS: Myasthenia Gravis (MG) Medication Alert  . Other Other (See Comments)    MAGNESIUM SALTS: Milk of Magnesia, some antacids (Maalox, Mylanta) REACTION: Myasthenia Gravis (MG) Medication Alerta  . Penicillamine Other (See Comments)    D-PENICILLAMINE *DO NOT CONFUSE WITH PENICILLIN*  REACTION: Myasthenia Gravis (MG) Medication Alert  . Procainamide Other (See Comments)    REACTION: Myasthenia Gravis (MG) Medication Alert  . Propranolol Other (See Comments)    REACTION: Myasthenia Gravis (MG) Medication Alert  . Quinidine Other (See Comments)    REACTION: Myasthenia Gravis (MG)  Medication Alert  . Quinine Derivatives Other (See Comments)    REACTION: Myasthenia Gravis (MG) Medication Alert  . Streptomycin Other (See Comments)    REACTION:  Myasthenia Gravis (MG) Medication Alert  . Timolol Other (See Comments)    REACTION: Myasthenia Gravis (MG) Medication Alert  . Tobramycin     REACTION: Myasthenia Gravis (MG) Medication Alert  . Xeomin [Incobotulinumtoxina] Other (See Comments)    REACTION: Myasthenia Gravis (MG) Medication Alert  . Zithromax [Azithromycin] Other (See Comments)    REACTION: Myasthenia Gravis (MG) Medication Alert       Follow-up Information   Follow up with Cassell Smiles., MD In 1 week.   Specialty:  Internal Medicine   Contact information:   [address] [place] [address] [phone]       Follow up with Jonette Eva, MD. (June 25 at 3:00.)    Specialty:  Gastroenterology   Contact information:   [address] [place] [address] [phone]        The results of significant diagnostics from this hospitalization (including imaging, microbiology, ancillary and laboratory) are listed below for reference.    Significant Diagnostic Studies: Ct Abdomen Pelvis Wo Contrast  04/01/2014   CLINICAL DATA:  Fever and diarrhea for 6 days.  EXAM: CT ABDOMEN AND PELVIS WITHOUT CONTRAST  TECHNIQUE: Multidetector CT imaging of the abdomen and pelvis was performed following the standard protocol without IV contrast.  COMPARISON:  DG RETROGRADE PYELOGRAM dated 07/16/2009; CT PELVIS W/O CM dated 05/19/2009  FINDINGS: Surgical absence of the gallbladder. Small accessory spleen. The unenhanced appearance of the liver, spleen, pancreas, adrenal glands, kidneys, abdominal aorta, inferior vena cava, and retroperitoneal lymph nodes is unremarkable. The stomach, small bowel, and colon are decompressed. No free air or free fluid in the abdomen.  Pelvis: Diverticulosis of the sigmoid colon.  Colonic wall thickening and infiltration around the sigmoid region consistent with diverticulitis. No abscess. There is a small left inguinal hernia containing fat. No free or loculated pelvic fluid collections. Appendix appears to be surgically absent. No significant pelvic lymphadenopathy. The bladder is distended without wall thickening. Degenerative changes in the spine.  IMPRESSION: Diverticulosis with inflammatory changes around the sigmoid colon consistent with diverticulitis. No abscess. Small left inguinal hernia containing fat.   Electronically Signed   By: Burman Nieves M.D.   On: 04/01/2014 22:02    Microbiology: No results found for this or any previous visit (from the past 240 hour(s)).   Labs:  Basic Metabolic Panel:  Recent Labs Lab 04/01/14 2004 04/02/14 1014 04/03/14 0529  NA 139 141 144  K 3.6* 4.2 3.7  CL 100 102 105  CO2 28 30 30   GLUCOSE 96 179* 126*  BUN 13 10 8   CREATININE 0.94 0.83 0.90  CALCIUM 9.0 8.6 8.7   Liver Function Tests:  Recent Labs Lab 04/01/14 2004  AST 21  ALT 27  ALKPHOS 80  BILITOT 1.1  PROT 6.7  ALBUMIN 3.5   No results found for this basename: LIPASE, AMYLASE,  in the last 168 hours No results found for this basename: AMMONIA,  in the last 168 hours CBC:  Recent Labs Lab 04/01/14 2004 04/02/14 1014 04/03/14 0529  WBC 9.5 6.2 4.9  NEUTROABS 6.9  --   --   HGB 13.6 13.7 12.9*  HCT 38.1* 38.9* 37.2*  MCV 88.8 90.3 90.1  PLT 178 168 178   Cardiac Enzymes: No results found for this basename: CKTOTAL, CKMB, CKMBINDEX, TROPONINI,  in the last 168 hours BNP: BNP (last 3 results) No results found for this basename: PROBNP,  in the last 8760 hours CBG:  Recent Labs Lab 04/02/14 0421 04/02/14 0720  GLUCAP 109* 128*       Signed:  Darrol Brandenburg A Zebulon Gantt  [facility] 04/03/2014, 11:08 AM

## 2014-04-03 NOTE — Telephone Encounter (Signed)
Pt is aware of OV with SF on 6/25 at 3 and appt card mailed

## 2014-04-03 NOTE — Progress Notes (Signed)
Patient discharged with instructions, prescriptions, and carenotes. He verbalized understanding via teach back method.  The patient left the floor with staff via ambulation in stable condition.

## 2014-04-03 NOTE — Progress Notes (Signed)
Subjective:  Patient feels much better. Wants to go home. Very little abdominal pain. Diarrhea much improved. No stool today.  Objective: Vital signs in last 24 hours: Temp:  [97 F (36.1 C)-97.8 F (36.6 C)] 97 F (36.1 C) (05/15 0654) Pulse Rate:  [55-59] 55 (05/15 0654) Resp:  [18] 18 (05/15 0654) BP: (118-127)/(59-67) 127/67 mmHg (05/15 0654) SpO2:  [100 %] 100 % (05/15 0654) Last BM Date: 04/02/14 General:   Alert,  Well-developed, well-nourished, pleasant and cooperative in NAD Head:  Normocephalic and atraumatic. Eyes:  Sclera clear, no icterus.   Abdomen:  Soft, nontender and nondistended.   Normal bowel sounds, without guarding, and without rebound.   Extremities:  Without clubbing, deformity or edema. Neurologic:  Alert and  oriented x4;  grossly normal neurologically. Skin:  Intact without significant lesions or rashes. Psych:  Alert and cooperative. Normal mood and affect.  Intake/Output from previous day: 05/14 0701 - 05/15 0700 In: 2996.7 [P.O.:1080; I.V.:1916.7] Out: 1000 [Urine:1000] Intake/Output this shift:    Lab Results: CBC  Recent Labs  04/01/14 2004 04/02/14 1014 04/03/14 0529  WBC 9.5 6.2 4.9  HGB 13.6 13.7 12.9*  HCT 38.1* 38.9* 37.2*  MCV 88.8 90.3 90.1  PLT 178 168 178   BMET  Recent Labs  04/01/14 2004 04/02/14 1014 04/03/14 0529  NA 139 141 144  K 3.6* 4.2 3.7  CL 100 102 105  CO2 28 30 30   GLUCOSE 96 179* 126*  BUN 13 10 8   CREATININE 0.94 0.83 0.90  CALCIUM 9.0 8.6 8.7   LFTs  Recent Labs  04/01/14 2004  BILITOT 1.1  ALKPHOS 80  AST 21  ALT 27  PROT 6.7  ALBUMIN 3.5   No results found for this basename: LIPASE,  in the last 72 hours PT/INR No results found for this basename: LABPROT, INR,  in the last 72 hours    Imaging Studies: Ct Abdomen Pelvis Wo Contrast  04/01/2014   CLINICAL DATA:  Fever and diarrhea for 6 days.  EXAM: CT ABDOMEN AND PELVIS WITHOUT CONTRAST  TECHNIQUE: Multidetector CT imaging of the  abdomen and pelvis was performed following the standard protocol without IV contrast.  COMPARISON:  DG RETROGRADE PYELOGRAM dated 07/16/2009; CT PELVIS W/O CM dated 05/19/2009  FINDINGS: Surgical absence of the gallbladder. Small accessory spleen. The unenhanced appearance of the liver, spleen, pancreas, adrenal glands, kidneys, abdominal aorta, inferior vena cava, and retroperitoneal lymph nodes is unremarkable. The stomach, small bowel, and colon are decompressed. No free air or free fluid in the abdomen.  Pelvis: Diverticulosis of the sigmoid colon. Colonic wall thickening and infiltration around the sigmoid region consistent with diverticulitis. No abscess. There is a small left inguinal hernia containing fat. No free or loculated pelvic fluid collections. Appendix appears to be surgically absent. No significant pelvic lymphadenopathy. The bladder is distended without wall thickening. Degenerative changes in the spine.  IMPRESSION: Diverticulosis with inflammatory changes around the sigmoid colon consistent with diverticulitis. No abscess. Small left inguinal hernia containing fat.   Electronically Signed   By: Burman Nieves M.D.   On: 04/01/2014 22:02  [2 weeks]   Assessment: 63 year old gentleman with history of myasthenia gravis on chronic Imuran as part of his medical regimen he presented with diarrhea, lower abdominal pain, fever. CT suggestive diverticulitis in the sigmoid region. Diarrhea usually not a typical component of diverticulitis. Symptoms resolved area Patient responded nicely to Unasyn. Afebrile over the last 24 hours.  Plan: 1. Transition to Augmentin when patient  goes home. Complete 10 day course.  2. Colonoscopy in the near future. Patient has follow up appointment already scheduled for June 25 at 3:00. 3. Transition to low residue diet today. If tolerates BF and lunch, OK to discharge. He will start high fiber diet when antibiotics complete.  4. Follow up on stool results when  available.   LOS: 2 days   Tiffany KocherLeslie S Rosabel Sermeno  04/03/2014, 8:14 AM

## 2014-04-06 LAB — STOOL CULTURE

## 2014-04-16 ENCOUNTER — Encounter: Payer: Self-pay | Admitting: *Deleted

## 2014-05-14 ENCOUNTER — Ambulatory Visit: Payer: Managed Care, Other (non HMO) | Admitting: Gastroenterology

## 2014-05-20 ENCOUNTER — Ambulatory Visit: Payer: Managed Care, Other (non HMO) | Admitting: Gastroenterology

## 2014-06-03 ENCOUNTER — Encounter: Payer: Self-pay | Admitting: Gastroenterology

## 2014-06-03 ENCOUNTER — Ambulatory Visit (INDEPENDENT_AMBULATORY_CARE_PROVIDER_SITE_OTHER): Payer: Managed Care, Other (non HMO) | Admitting: Gastroenterology

## 2014-06-03 ENCOUNTER — Encounter (INDEPENDENT_AMBULATORY_CARE_PROVIDER_SITE_OTHER): Payer: Self-pay

## 2014-06-03 ENCOUNTER — Other Ambulatory Visit: Payer: Self-pay | Admitting: Gastroenterology

## 2014-06-03 VITALS — BP 129/79 | HR 63 | Temp 97.2°F | Resp 20 | Ht 73.0 in | Wt 205.0 lb

## 2014-06-03 DIAGNOSIS — Z1211 Encounter for screening for malignant neoplasm of colon: Secondary | ICD-10-CM

## 2014-06-03 DIAGNOSIS — K5732 Diverticulitis of large intestine without perforation or abscess without bleeding: Secondary | ICD-10-CM

## 2014-06-03 MED ORDER — SOD PICOSULFATE-MAG OX-CIT ACD 10-3.5-12 MG-GM-GM PO PACK: 1.0000 | PACK | ORAL | Status: DC

## 2014-06-03 NOTE — Patient Instructions (Addendum)
COLONOSCOPY ON JUL 31-PREPOPIK. CONTINUE GLUCOPHAGE. FOLLOW A FULL LIQUID DIET THE DAY BEFORE YOUR COLONOSCOPY. SEE INFO BELOW.  DRINK WATER TO KEEP YOUR URINE LIGHT YELLOW.  FOLLOW A HIGH FIBER DIET. SEE INFO BELOW.  FOLLOW UP IN 6 MOS.    Full Liquid Diet A high-calorie, high-protein supplement should be used to meet your nutritional requirements when the full liquid diet is continued for more than 2 or 3 days. If this diet is to be used for an extended period of time (more than 7 days), a multivitamin should be considered.  Breads and Starches  Allowed: None are allowed except crackers WHOLE OR pureed (made into a thick, smooth soup) in soup. Cooked, refined corn, oat, rice, rye, and wheat cereals are also allowed.   Avoid: Any others.    Potatoes/Pasta/Rice  Allowed: ANY ITEM AS A SOUP OR SMALL PLATE OF MASHED POTATOES OR RICE.       Vegetables  Allowed: Strained tomato or vegetable juice. Vegetables pureed in soup.   Avoid: Any others.    Fruit  Allowed: Any strained fruit juices and fruit drinks. Include 1 serving of citrus or vitamin C-enriched fruit juice daily.   Avoid: Any others.  Meat and Meat Substitutes  Allowed: Egg  Avoid: Any meat, fish, or fowl. All cheese.  Milk  Allowed: Milk beverages, including milk shakes and instant breakfast mixes. Smooth yogurt.   Avoid: Any others. Avoid dairy products if not tolerated.    Soups and Combination Foods  Allowed: Broth, strained cream soups. Strained, broth-based soups.   Avoid: Any others.    Desserts and Sweets  Allowed: flavored gelatin, tapioca, plain ice cream, sherbet, smooth pudding, junket, fruit ices, frozen ice pops, pudding pops,, frozen fudge pops, chocolate syrup. Sugar, honey, jelly, syrup.   Avoid: Any others.  Fats and Oils  Allowed: Margarine, butter, cream, sour cream, oils.   Avoid: Any others.  Beverages  Allowed: All.   Avoid: None.  Condiments  Allowed: Iodized  salt, pepper, spices, flavorings. Cocoa powder.   Avoid: Any others.    SAMPLE MEAL PLAN Breakfast   cup orange juice.   1 cup cooked wheat cereal.   1 cup  milk.   1 cup beverage (coffee or tea).   Cream or sugar, if desired.    Midmorning Snack  2 SCRAMBLED OR HARD BOILED EGG   Lunch  1 cup cream soup.    cup fruit juice.   1 cup milk.    cup custard.   1 cup beverage (coffee or tea).   Cream or sugar, if desired.    Midafternoon Snack  1 cup milk shake.  Dinner  1 cup cream soup.    cup fruit juice.   1 cup milk.    cup pudding.   1 cup beverage (coffee or tea).   Cream or sugar, if desired.  Evening Snack  1 cup supplement.  To increase calories, add sugar, cream, butter, or margarine if possible. Nutritional supplements will also increase the total calories.    High-Fiber Diet A high-fiber diet changes your normal diet to include more whole grains, legumes, fruits, and vegetables. Changes in the diet involve replacing refined carbohydrates with unrefined foods. The calorie level of the diet is essentially unchanged. The Dietary Reference Intake (recommended amount) for adult males is 38 grams per day. For adult females, it is 25 grams per day. Pregnant and lactating women should consume 28 grams of fiber per day. Fiber is the intact part  of a plant that is not broken down during digestion. Functional fiber is fiber that has been isolated from the plant to provide a beneficial effect in the body. PURPOSE  Increase stool bulk.   Ease and regulate bowel movements.   Lower cholesterol.  INDICATIONS THAT YOU NEED MORE FIBER  Constipation and hemorrhoids.   Uncomplicated diverticulosis (intestine condition) and irritable bowel syndrome.   Weight management.   As a protective measure against hardening of the arteries (atherosclerosis), diabetes, and cancer.   GUIDELINES FOR INCREASING FIBER IN THE DIET  Start adding fiber to the  diet slowly. A gradual increase of about 5 more grams (2 slices of whole-wheat bread, 2 servings of most fruits or vegetables, or 1 bowl of high-fiber cereal) per day is best. Too rapid an increase in fiber may result in constipation, flatulence, and bloating.   Drink enough water and fluids to keep your urine clear or pale yellow. Water, juice, or caffeine-free drinks are recommended. Not drinking enough fluid may cause constipation.   Eat a variety of high-fiber foods rather than one type of fiber.   Try to increase your intake of fiber through using high-fiber foods rather than fiber pills or supplements that contain small amounts of fiber.   The goal is to change the types of food eaten. Do not supplement your present diet with high-fiber foods, but replace foods in your present diet.  INCLUDE A VARIETY OF FIBER SOURCES  Replace refined and processed grains with whole grains, canned fruits with fresh fruits, and incorporate other fiber sources. White rice, white breads, and most bakery goods contain little or no fiber.   Brown whole-grain rice, buckwheat oats, and many fruits and vegetables are all good sources of fiber. These include: broccoli, Brussels sprouts, cabbage, cauliflower, beets, sweet potatoes, white potatoes (skin on), carrots, tomatoes, eggplant, squash, berries, fresh fruits, and dried fruits.   Cereals appear to be the richest source of fiber. Cereal fiber is found in whole grains and bran. Bran is the fiber-rich outer coat of cereal grain, which is largely removed in refining. In whole-grain cereals, the bran remains. In breakfast cereals, the largest amount of fiber is found in those with "bran" in their names. The fiber content is sometimes indicated on the label.   You may need to include additional fruits and vegetables each day.   In baking, for 1 cup white flour, you may use the following substitutions:   1 cup whole-wheat flour minus 2 tablespoons.   1/2 cup white  flour plus 1/2 cup whole-wheat flour.

## 2014-06-03 NOTE — Progress Notes (Signed)
cc'd to pcp 

## 2014-06-03 NOTE — Progress Notes (Signed)
Reminder in epic °

## 2014-06-03 NOTE — Assessment & Plan Note (Signed)
SX RESOLVED. LAST TCS 2005.  TCS WITHIN NEXT MONTH. DISCUSSED PROCEDURE, BENEFITS, AND RISKS. EAT FIBER DRINK WATER FOLLOW UP IN 6 MOS.

## 2014-06-03 NOTE — Progress Notes (Signed)
   Subjective:    Patient ID: [name], male    DOB: 12/26/1950, 63 y.o.   MRN: [id number]  Cassell Smiles., MD  HPI SWALLOWING GOOD. No questions or concerns. NO MORE ABDOMINAL PAIN. FULLY RECOVERED FROM DIVERTICULITIS. HAD ONE 2005: MJ. BMs: 2-3X/DAY. PT DENIES FEVER, CHILLS, HEMATOCHEZIA, nausea, vomiting, melena, diarrhea, CHEST PAIN, SHORTNESS OF BREATH,  CHANGE IN BOWEL IN HABITS, constipation, abdominal pain, problems swallowing, problems with sedation, heartburn or indigestion.  Past Medical History  Diagnosis Date  . GERD (gastroesophageal reflux disease)   . Esophageal stricture SEP 2011    FOOD IMPACTION-->SAV DIL 16 MM  . Myasthenia gravis   . Hypertension   . Thyroid disease   . Kidney stone   . Deafness in right ear   . Candida esophagitis OCT 2014   Past Surgical History  Procedure Laterality Date  . Upper gastrointestinal endoscopy  SEP 2011    SAV DIL  . Kidney stone surgery    . Esophagogastroduodenoscopy N/A 07/25/2013    Shallow ulcer in the cardia, mid esophageal web, stricture at GE junction  . Cholecystectomy    . Tonsillectomy    . Thymectomy    . Hernia repair      right inguinal  . Esophagogastroduodenoscopy (egd) with esophageal dilation N/A 08/29/2013    Candida esophagitis, small hiatal hernia, moderate nonerosive gastritis, mid esophageal web  . Colonoscopy  2005    Dr. Lovell Sheehan  . Palate surgery         . Nasal septum surgery         Review of Systems     Objective:   Physical Exam  Vitals reviewed. Constitutional: He is oriented to person, place, and time. He appears well-nourished. No distress.  HENT:  Head: Normocephalic and atraumatic.  Mouth/Throat: Oropharynx is clear and moist. No oropharyngeal exudate.  Eyes: Pupils are equal, round, and reactive to light. No scleral icterus.  Neck: Normal range of motion. Neck supple.  Cardiovascular: Normal rate, regular rhythm and normal heart sounds.   Pulmonary/Chest: Effort  normal and breath sounds normal. No respiratory distress.  Abdominal: Soft. Bowel sounds are normal. He exhibits no distension. There is no tenderness.  Musculoskeletal: He exhibits no edema.  Neurological: He is alert and oriented to person, place, and time.  NO FOCAL DEFICITS   Psychiatric: He has a normal mood and affect.          Assessment & Plan:

## 2014-06-03 NOTE — Assessment & Plan Note (Signed)
SCREENING TCS WITH PREPOPIK

## 2014-06-10 ENCOUNTER — Telehealth: Payer: Self-pay | Admitting: Gastroenterology

## 2014-06-10 ENCOUNTER — Ambulatory Visit (HOSPITAL_COMMUNITY)
Admission: RE | Admit: 2014-06-10 | Discharge: 2014-06-10 | Disposition: A | Discharge status: Home / Self Care | Payer: Managed Care, Other (non HMO) | Source: Ambulatory Visit | Attending: Gastroenterology | Admitting: Gastroenterology

## 2014-06-10 ENCOUNTER — Other Ambulatory Visit: Payer: Self-pay | Admitting: Gastroenterology

## 2014-06-10 DIAGNOSIS — R1032 Left lower quadrant pain: Secondary | ICD-10-CM | POA: Insufficient documentation

## 2014-06-10 DIAGNOSIS — R509 Fever, unspecified: Secondary | ICD-10-CM | POA: Insufficient documentation

## 2014-06-10 DIAGNOSIS — K5732 Diverticulitis of large intestine without perforation or abscess without bleeding: Secondary | ICD-10-CM

## 2014-06-10 DIAGNOSIS — N200 Calculus of kidney: Secondary | ICD-10-CM | POA: Insufficient documentation

## 2014-06-10 DIAGNOSIS — K4020 Bilateral inguinal hernia, without obstruction or gangrene, not specified as recurrent: Secondary | ICD-10-CM | POA: Insufficient documentation

## 2014-06-10 MED ORDER — AMOXICILLIN-POT CLAVULANATE 500-125 MG PO TABS: 1.0000 | ORAL_TABLET | Freq: Two times a day (BID) | ORAL | Status: DC

## 2014-06-10 MED ORDER — PEG-KCL-NACL-NASULF-NA ASC-C 100 G PO SOLR: 1.0000 | Freq: Once | ORAL | Status: AC

## 2014-06-10 NOTE — Telephone Encounter (Signed)
Pt's wife also called and said he is having abdominal pain, fever and chills and needs to be seen today. Darl Pikes offered appt at 3:00 pm tomorrow but she said he needs to be seen TODAY.  She is afraid that he will end up in the hospital again. Please advise!

## 2014-06-10 NOTE — Telephone Encounter (Signed)
Pt having stomach cramping and having fever and chills. LEAVING FOR [place]. NEEDS STAT CT. RX FOR AUGMENTIN BID FOR 10 DAYS. SPOKE WITH WIFE. WAIT UNTIL CT COMPLETE TO PICK UP ABX.

## 2014-06-10 NOTE — Assessment & Plan Note (Signed)
RECURRENT ABDOMINAL PAIN/FEVER.

## 2014-06-10 NOTE — Telephone Encounter (Signed)
PATIENT ON HIS WAY TO RADIOLOGY

## 2014-06-10 NOTE — Telephone Encounter (Signed)
Patients wife called and expressed her concerns with the prep that was chosen for [place] which was Prepopick with contains magnesium and his wife states that he cant take magnesium, please advise?

## 2014-06-10 NOTE — Telephone Encounter (Signed)
Also, Dr. Darrick Penna sent in new prescription for Movie prep and I mailed instructions for that.

## 2014-06-10 NOTE — Telephone Encounter (Signed)
Routing to Soledad Gerlach to schedule the CT.

## 2014-06-17 ENCOUNTER — Telehealth: Payer: Self-pay | Admitting: *Deleted

## 2014-06-17 NOTE — Telephone Encounter (Signed)
Patient has questions for Dr. Darrick Penna before doing anything please call him on mobile #

## 2014-06-17 NOTE — Telephone Encounter (Signed)
Pt returned [name]'s call. Please advise [phone]

## 2014-06-17 NOTE — Telephone Encounter (Signed)
Noted  

## 2014-06-17 NOTE — Telephone Encounter (Signed)
I have LMOM for patient to return my call.  

## 2014-06-17 NOTE — Telephone Encounter (Addendum)
Called patient IN RADIOLOGY. CT SHOWS DIVERTICULITIS. COMPLETE AUGMENTIN. RSC TCS AFTER AUG 22. PT NEEDS GENERAL SURGERY FOR LEFT PARTIAL COLECTOMY.

## 2014-06-19 ENCOUNTER — Other Ambulatory Visit: Payer: Self-pay | Admitting: Gastroenterology

## 2014-06-19 NOTE — Telephone Encounter (Signed)
TCS has been R/S per [facility] to Monday 8/24 at 9:30 am and I have mailed [name] new instruction sheet and his wife is also aware

## 2014-07-17 ENCOUNTER — Encounter (HOSPITAL_COMMUNITY)
Admission: RE | Disposition: A | Discharge status: Home / Self Care | Payer: Self-pay | Source: Ambulatory Visit | Attending: Gastroenterology

## 2014-07-17 ENCOUNTER — Ambulatory Visit (HOSPITAL_COMMUNITY)
Admission: RE | Admit: 2014-07-17 | Discharge: 2014-07-17 | Disposition: A | Discharge status: Home / Self Care | Payer: Managed Care, Other (non HMO) | Source: Ambulatory Visit | Attending: Gastroenterology | Admitting: Gastroenterology

## 2014-07-17 ENCOUNTER — Encounter (HOSPITAL_COMMUNITY): Payer: Self-pay | Admitting: *Deleted

## 2014-07-17 DIAGNOSIS — Z79899 Other long term (current) drug therapy: Secondary | ICD-10-CM | POA: Diagnosis not present

## 2014-07-17 DIAGNOSIS — K5730 Diverticulosis of large intestine without perforation or abscess without bleeding: Secondary | ICD-10-CM | POA: Diagnosis not present

## 2014-07-17 DIAGNOSIS — K219 Gastro-esophageal reflux disease without esophagitis: Secondary | ICD-10-CM | POA: Insufficient documentation

## 2014-07-17 DIAGNOSIS — E079 Disorder of thyroid, unspecified: Secondary | ICD-10-CM | POA: Insufficient documentation

## 2014-07-17 DIAGNOSIS — Z1211 Encounter for screening for malignant neoplasm of colon: Secondary | ICD-10-CM | POA: Insufficient documentation

## 2014-07-17 DIAGNOSIS — I10 Essential (primary) hypertension: Secondary | ICD-10-CM | POA: Diagnosis not present

## 2014-07-17 DIAGNOSIS — K648 Other hemorrhoids: Secondary | ICD-10-CM | POA: Insufficient documentation

## 2014-07-17 DIAGNOSIS — Q438 Other specified congenital malformations of intestine: Secondary | ICD-10-CM | POA: Diagnosis not present

## 2014-07-17 HISTORY — PX: COLONOSCOPY: SHX5424

## 2014-07-17 LAB — GLUCOSE, CAPILLARY: Glucose-Capillary: 120 mg/dL — ABNORMAL HIGH (ref 70–99)

## 2014-07-17 SURGERY — COLONOSCOPY
Anesthesia: Moderate Sedation

## 2014-07-17 MED ORDER — MIDAZOLAM HCL 5 MG/5ML IJ SOLN
INTRAMUSCULAR | Status: AC
  Filled 2014-07-17: qty 10

## 2014-07-17 MED ORDER — MEPERIDINE HCL 100 MG/ML IJ SOLN
INTRAMUSCULAR | Status: DC | PRN
  Administered 2014-07-17 (×3): 25 mg via INTRAVENOUS

## 2014-07-17 MED ORDER — MEPERIDINE HCL 100 MG/ML IJ SOLN
INTRAMUSCULAR | Status: AC
  Filled 2014-07-17: qty 2

## 2014-07-17 MED ORDER — MIDAZOLAM HCL 5 MG/5ML IJ SOLN
INTRAMUSCULAR | Status: DC | PRN
  Administered 2014-07-17 (×2): 2 mg via INTRAVENOUS
  Administered 2014-07-17: 1 mg via INTRAVENOUS

## 2014-07-17 MED ORDER — SODIUM CHLORIDE 0.9 % IV SOLN
INTRAVENOUS | Status: DC
  Administered 2014-07-17: 09:00:00 via INTRAVENOUS

## 2014-07-17 MED ORDER — STERILE WATER FOR IRRIGATION IR SOLN
Status: DC | PRN
  Administered 2014-07-17: 10:00:00

## 2014-07-17 NOTE — H&P (Signed)
Primary Care Physician:  Cassell Smiles., MD Primary Gastroenterologist:  Dr. Darrick Penna  Pre-Procedure History & Physical: HPI:  [name] is a 63 y.o. male here for COLON CANCER SCREENING.  Past Medical History  Diagnosis Date  . GERD (gastroesophageal reflux disease)   . Esophageal stricture SEP 2011    FOOD IMPACTION-->SAV DIL 16 MM  . Myasthenia gravis   . Hypertension   . Thyroid disease   . Kidney stone   . Deafness in right ear   . Candida esophagitis OCT 2014    Past Surgical History  Procedure Laterality Date  . Upper gastrointestinal endoscopy  SEP 2011    SAV DIL  . Kidney stone surgery    . Esophagogastroduodenoscopy N/A 07/25/2013    Shallow ulcer in the cardia, mid esophageal web, stricture at GE junction  . Cholecystectomy    . Tonsillectomy    . Thymectomy    . Hernia repair      right inguinal  . Esophagogastroduodenoscopy (egd) with esophageal dilation N/A 08/29/2013    Candida esophagitis, small hiatal hernia, moderate nonerosive gastritis, mid esophageal web  . Colonoscopy  2005    Dr. Lovell Sheehan  . Palate surgery         . Nasal septum surgery      Prior to Admission medications   Medication Sig Start Date End Date Taking? Authorizing Provider  acyclovir (ZOVIRAX) 400 MG tablet Take 400 mg by mouth every morning.    Yes Historical Provider, MD  amoxicillin-clavulanate (AUGMENTIN) 500-125 MG per tablet Take 1 tablet (500 mg total) by mouth 2 (two) times daily. FOR 10 DAYS 06/10/14  Yes West Bali, MD  azaTHIOprine (IMURAN) 50 MG tablet Take 150 mg by mouth every morning.    Yes Historical Provider, MD  metFORMIN (GLUCOPHAGE-XR) 500 MG 24 hr tablet Take 500 mg by mouth 2 (two) times daily.  06/30/13  Yes Historical Provider, MD  omeprazole (PRILOSEC) 20 MG capsule Take 20 mg by mouth 2 (two) times daily.  07/31/13  Yes West Bali, MD  SYNTHROID 137 MCG tablet Take 137 mcg by mouth daily before breakfast.  07/03/13  Yes Historical Provider, MD   valsartan (DIOVAN) 80 MG tablet Take 80 mg by mouth daily.   Yes Historical Provider, MD  Calcium Carbonate-Vitamin D (CALCIUM 600+D) 600-200 MG-UNIT TABS Take 2 tablets by mouth every morning.     Historical Provider, MD  Omega-3 Fatty Acids (FISH OIL) 1200 MG CAPS Take 2 capsules by mouth every morning.     Historical Provider, MD  ondansetron (ZOFRAN-ODT) 4 MG disintegrating tablet Take 1 tablet by mouth every 4 (four) hours as needed. For nausea/vomiting 03/30/14   Historical Provider, MD  pyridostigmine (MESTINON) 60 MG tablet Take 60 mg by mouth every morning.     Historical Provider, MD  vitamin C (ASCORBIC ACID) 500 MG tablet Take 1,000 mg by mouth every morning.     Historical Provider, MD  vitamin E 400 UNIT capsule Take 800 Units by mouth every morning.     Historical Provider, MD    Allergies as of 06/03/2014 - Review Complete 06/03/2014  Allergen Reaction Noted  . Levofloxacin Anaphylaxis 03/01/2011  . Tequin [gatifloxacin] Anaphylaxis 06/07/2012  . Aminoglycosides Other (See Comments) 06/07/2012  . Avelox [moxifloxacin hcl in nacl] Other (See Comments) 06/07/2012  . Botox [onabotulinumtoxina] Other (See Comments) 06/07/2012  . Botulinum toxins Other (See Comments) 06/07/2012  . Calcium channel blockers Other (See Comments) 06/07/2012  . Ciprofloxacin Other (See  Comments) 06/07/2012  . Curare [tubocurarine] Other (See Comments) 06/07/2012  . Dysport [abobotulinumtoxina] Other (See Comments) 06/07/2012  . Erythromycin Other (See Comments) 06/07/2012  . Factive [gemifloxacin] Other (See Comments) 06/07/2012  . Floxin [ofloxacin] Other (See Comments) 06/07/2012  . Gentamycin [gentamicin] Other (See Comments) 06/07/2012  . Interferons Other (See Comments) 06/07/2012  . Kanamycin Other (See Comments) 06/07/2012  . Ketek [telithromycin] Other (See Comments) 06/07/2012  . Levaquin [levofloxacin in d5w] Other (See Comments) 06/07/2012  . Macrolides and ketolides Other (See  Comments) 06/07/2012  . Magnesium-containing compounds  04/01/2014  . Myobloc [rimabotulinumtoxinb] Other (See Comments) 06/07/2012  . Neomycin Other (See Comments) 06/07/2012  . Noroxin [norfloxacin] Other (See Comments) 06/07/2012  . Other Other (See Comments) 06/07/2012  . Penicillamine Other (See Comments) 06/07/2012  . Procainamide Other (See Comments) 06/07/2012  . Propranolol Other (See Comments) 06/07/2012  . Quinidine Other (See Comments) 06/07/2012  . Quinine derivatives Other (See Comments) 06/07/2012  . Streptomycin Other (See Comments) 06/07/2012  . Timolol Other (See Comments) 06/07/2012  . Tobramycin  06/07/2012  . Xeomin [incobotulinumtoxina] Other (See Comments) 06/07/2012  . Zithromax [azithromycin] Other (See Comments) 06/07/2012    Family History  Problem Relation Age of Onset  . Colon cancer Neg Hx   . Liver disease Father     age 67, deceased    History   Social History  . Marital Status: Married    Spouse Name: N/A    Number of Children: N/A  . Years of Education: N/A   Occupational History  . Not on file.   Social History Main Topics  . Smoking status: Never Smoker   . Smokeless tobacco: Never Used  . Alcohol Use: No  . Drug Use: No  . Sexual Activity: Yes    Partners: Female    Pharmacist, hospital Protection: None     Comment: spouse   Other Topics Concern  . Not on file   Social History Narrative  . No narrative on file    Review of Systems: See HPI, otherwise negative ROS   Physical Exam: BP 143/88  Pulse 63  Temp(Src) 97.8 F (36.6 C) (Oral)  Resp 18  Ht 6\' 1"  (1.854 m)  Wt 205 lb (92.987 kg)  BMI 27.05 kg/m2  SpO2 99% General:   Alert,  pleasant and cooperative in NAD Head:  Normocephalic and atraumatic. Neck:  Supple; Lungs:  Clear throughout to auscultation.    Heart:  Regular rate and rhythm. Abdomen:  Soft, nontender and nondistended. Normal bowel sounds, without guarding, and without rebound.   Neurologic:  Alert  and  oriented x4;  grossly normal neurologically.  Impression/Plan:     SCREENING  Plan:  1. TCS TODAY

## 2014-07-17 NOTE — Discharge Instructions (Signed)
You have hemorrhoids and diverticulosis IN YOUR LEFT COLON.   Next colonoscopy in 10 years WITH AN OVERTUBE.  FOLLOW A HIGH FIBER DIET. AVOID ITEMS THAT CAUSE BLOATING & GAS. See info below.   Colonoscopy Care After Read the instructions outlined below and refer to this sheet in the next week. These discharge instructions provide you with general information on caring for yourself after you leave the hospital. While your treatment has been planned according to the most current medical practices available, unavoidable complications occasionally occur. If you have any problems or questions after discharge, call DR. Zeynep Fantroy, [phone].  ACTIVITY  You may resume your regular activity, but move at a slower pace for the next 24 hours.   Take frequent rest periods for the next 24 hours.   Walking will help get rid of the air and reduce the bloated feeling in your belly (abdomen).   No driving for 24 hours (because of the medicine (anesthesia) used during the test).   You may shower.   Do not sign any important legal documents or operate any machinery for 24 hours (because of the anesthesia used during the test).    NUTRITION  Drink plenty of fluids.   You may resume your normal diet as instructed by your doctor.   Begin with a light meal and progress to your normal diet. Heavy or fried foods are harder to digest and may make you feel sick to your stomach (nauseated).   Avoid alcoholic beverages for 24 hours or as instructed.    MEDICATIONS  You may resume your normal medications.   WHAT YOU CAN EXPECT TODAY  Some feelings of bloating in the abdomen.   Passage of more gas than usual.   Spotting of blood in your stool or on the toilet paper  .  IF YOU HAD POLYPS REMOVED DURING THE COLONOSCOPY:  Eat a soft diet IF YOU HAVE NAUSEA, BLOATING, ABDOMINAL PAIN, OR VOMITING.    FINDING OUT THE RESULTS OF YOUR TEST Not all test results are available during your visit. DR.  Darrick Penna WILL CALL YOU WITHIN 7 DAYS OF YOUR PROCEDUE WITH YOUR RESULTS. Do not assume everything is normal if you have not heard from DR. Lisa Milian IN ONE WEEK, CALL HER OFFICE AT [phone].  SEEK IMMEDIATE MEDICAL ATTENTION AND CALL THE OFFICE: [phone] IF:  You have more than a spotting of blood in your stool.   Your belly is swollen (abdominal distention).   You are nauseated or vomiting.   You have a temperature over 101F.   You have abdominal pain or discomfort that is severe or gets worse throughout the day.  High-Fiber Diet A high-fiber diet changes your normal diet to include more whole grains, legumes, fruits, and vegetables. Changes in the diet involve replacing refined carbohydrates with unrefined foods. The calorie level of the diet is essentially unchanged. The Dietary Reference Intake (recommended amount) for adult males is 38 grams per day. For adult females, it is 25 grams per day. Pregnant and lactating women should consume 28 grams of fiber per day. Fiber is the intact part of a plant that is not broken down during digestion. Functional fiber is fiber that has been isolated from the plant to provide a beneficial effect in the body. PURPOSE  Increase stool bulk.   Ease and regulate bowel movements.   Lower cholesterol.  INDICATIONS THAT YOU NEED MORE FIBER  Constipation and hemorrhoids.   Uncomplicated diverticulosis (intestine condition) and irritable bowel syndrome.   Weight  management.   As a protective measure against hardening of the arteries (atherosclerosis), diabetes, and cancer.   GUIDELINES FOR INCREASING FIBER IN THE DIET  Start adding fiber to the diet slowly. A gradual increase of about 5 more grams (2 slices of whole-wheat bread, 2 servings of most fruits or vegetables, or 1 bowl of high-fiber cereal) per day is best. Too rapid an increase in fiber may result in constipation, flatulence, and bloating.   Drink enough water and fluids to keep your  urine clear or pale yellow. Water, juice, or caffeine-free drinks are recommended. Not drinking enough fluid may cause constipation.   Eat a variety of high-fiber foods rather than one type of fiber.   Try to increase your intake of fiber through using high-fiber foods rather than fiber pills or supplements that contain small amounts of fiber.   The goal is to change the types of food eaten. Do not supplement your present diet with high-fiber foods, but replace foods in your present diet.  INCLUDE A VARIETY OF FIBER SOURCES  Replace refined and processed grains with whole grains, canned fruits with fresh fruits, and incorporate other fiber sources. White rice, white breads, and most bakery goods contain little or no fiber.   Brown whole-grain rice, buckwheat oats, and many fruits and vegetables are all good sources of fiber. These include: broccoli, Brussels sprouts, cabbage, cauliflower, beets, sweet potatoes, white potatoes (skin on), carrots, tomatoes, eggplant, squash, berries, fresh fruits, and dried fruits.   Cereals appear to be the richest source of fiber. Cereal fiber is found in whole grains and bran. Bran is the fiber-rich outer coat of cereal grain, which is largely removed in refining. In whole-grain cereals, the bran remains. In breakfast cereals, the largest amount of fiber is found in those with "bran" in their names. The fiber content is sometimes indicated on the label.   You may need to include additional fruits and vegetables each day.   In baking, for 1 cup white flour, you may use the following substitutions:   1 cup whole-wheat flour minus 2 tablespoons.   1/2 cup white flour plus 1/2 cup whole-wheat flour.   Diverticulosis Diverticulosis is a common condition that develops when small pouches (diverticula) form in the wall of the colon. The risk of diverticulosis increases with age. It happens more often in people who eat a low-fiber diet. Most individuals with  diverticulosis have no symptoms. Those individuals with symptoms usually experience belly (abdominal) pain, constipation, or loose stools (diarrhea).  HOME CARE INSTRUCTIONS  Increase the amount of fiber in your diet as directed by your caregiver or dietician. This may reduce symptoms of diverticulosis.   Drink at least 6 to 8 glasses of water each day to prevent constipation.   Try not to strain when you have a bowel movement.   Avoiding nuts and seeds to prevent complications is still an uncertain benefit.       FOODS HAVING HIGH FIBER CONTENT INCLUDE:  Fruits. Apple, peach, pear, tangerine, raisins, prunes.   Vegetables. Brussels sprouts, asparagus, broccoli, cabbage, carrot, cauliflower, romaine lettuce, spinach, summer squash, tomato, winter squash, zucchini.   Starchy Vegetables. Baked beans, kidney beans, lima beans, split peas, lentils, potatoes (with skin).   Grains. Whole wheat bread, brown rice, bran flake cereal, plain oatmeal, white rice, shredded wheat, bran muffins.    SEEK IMMEDIATE MEDICAL CARE IF:  You develop increasing pain or severe bloating.   You have an oral temperature above 101F.   You  develop vomiting or bowel movements that are bloody or black.   Hemorrhoids Hemorrhoids are dilated (enlarged) veins around the rectum. Sometimes clots will form in the veins. This makes them swollen and painful. These are called thrombosed hemorrhoids. Causes of hemorrhoids include:  Constipation.   Straining to have a bowel movement.   HEAVY LIFTING HOME CARE INSTRUCTIONS  Eat a well balanced diet and drink 6 to 8 glasses of water every day to avoid constipation. You may also use a bulk laxative.   Avoid straining to have bowel movements.   Keep anal area dry and clean.   Do not use a donut shaped pillow or sit on the toilet for long periods. This increases blood pooling and pain.   Move your bowels when your body has the urge; this will require less  straining and will decrease pain and pressure.

## 2014-07-17 NOTE — Telephone Encounter (Signed)
REVIEWED RESULTS IN ENDOSCOPY. 

## 2014-07-20 ENCOUNTER — Telehealth: Payer: Self-pay | Admitting: Gastroenterology

## 2014-07-20 ENCOUNTER — Other Ambulatory Visit: Payer: Self-pay | Admitting: Gastroenterology

## 2014-07-20 DIAGNOSIS — K5732 Diverticulitis of large intestine without perforation or abscess without bleeding: Secondary | ICD-10-CM

## 2014-07-20 NOTE — Op Note (Signed)
[facility] [address] [place], [address]   COLONOSCOPY PROCEDURE REPORT  PATIENT: [name], [name]  MR#: [id number] BIRTHDATE: 01-Dec-1950 , 62  yrs. old GENDER: Male ENDOSCOPIST: Jonette Eva, MD REFERRED IR:WERXV Sherwood Gambler, M.D. PROCEDURE DATE:  07/17/2014 PROCEDURE:   Colonoscopy, diagnostic INDICATIONS:Average risk patient for colon cancer.  PMHx: ON CT SCAN-DIVERTICULITIS [place](MAY 2015) & DC/PROX [place] JUL 2015 MEDICATIONS: Demerol 75 mg IV and Versed 5 mg IV  DESCRIPTION OF PROCEDURE:    Physical exam was performed.  Informed consent was obtained from the patient after explaining the benefits, risks, and alternatives to procedure.  The patient was connected to monitor and placed in left lateral position. Continuous oxygen was provided by nasal cannula and IV medicine administered through an indwelling cannula.  After administration of sedation and rectal exam, the patients rectum was intubated and the EC-[id number] ([id number])  colonoscope was advanced under direct visualization to the ileum.  The scope was removed slowly by carefully examining the color, texture, anatomy, and integrity mucosa on the way out.  The patient was recovered in endoscopy and discharged home in satisfactory condition.    COLON FINDINGS: There was moderate diverticulosis noted in the descending colon and sigmoid colon with associated tortuosity and muscular hypertrophy.  , The colon mucosa was otherwise normal.  , Moderate sized internal hemorrhoids were found.  , and The LEFT colon IS redundant.  Manual abdominal counter-pressure was used to reach the cecum.  PREP QUALITY: excellent. CECAL W/D TIME: 16 minutes COMPLICATIONS: None  ENDOSCOPIC IMPRESSION: 1.   Moderate diverticulosis in the descending colon and sigmoid colon 2.   The colon mucosa was otherwise normal 3.   Moderate sized internal hemorrhoids 4.   The LEFT colon IS redundant   RECOMMENDATIONS: Next colonoscopy in 10  years WITH AN OVERTUBE. FOLLOW A HIGH FIBER DIET.  AVOID ITEMS THAT CAUSE BLOATING & GAS. SEE SURGERY FOR PARTIAL COLECTOMY.       _______________________________ Rosalie DoctorJonette Eva, MD 07/20/2014 3:30 PM

## 2014-07-20 NOTE — Telephone Encounter (Signed)
Returned Call, [facility]

## 2014-07-20 NOTE — Telephone Encounter (Signed)
Referral to [facility] per Patient request has been made

## 2014-07-20 NOTE — Telephone Encounter (Signed)
PATIENT WIFE CHERYL CALLED WONDERING IF SHE CAN SCHEDULE HER HUSBAND WITH A SURGEON REGARDING HIS COLONOSCOPY RESULTS OR IF HER NEEDS A REFERRAL.  PLEASE GIVE HER A CALL BACK AT [phone]

## 2014-07-21 NOTE — Telephone Encounter (Signed)
Pt has an appt on 08/10/14 at 4:15pm with Dr. Gerrit Friends. Pt is aware of appt time and info. You will not be able to see the appt date and info. We started a new system today

## 2014-08-10 ENCOUNTER — Other Ambulatory Visit (INDEPENDENT_AMBULATORY_CARE_PROVIDER_SITE_OTHER): Payer: Self-pay

## 2014-08-10 DIAGNOSIS — K5732 Diverticulitis of large intestine without perforation or abscess without bleeding: Secondary | ICD-10-CM

## 2014-08-14 ENCOUNTER — Ambulatory Visit
Admission: RE | Admit: 2014-08-14 | Discharge: 2014-08-14 | Disposition: A | Discharge status: Home / Self Care | Payer: Managed Care, Other (non HMO) | Source: Ambulatory Visit | Attending: Surgery | Admitting: Surgery

## 2014-08-14 ENCOUNTER — Other Ambulatory Visit: Payer: Managed Care, Other (non HMO)

## 2014-08-14 DIAGNOSIS — K5732 Diverticulitis of large intestine without perforation or abscess without bleeding: Secondary | ICD-10-CM

## 2014-08-20 ENCOUNTER — Telehealth (INDEPENDENT_AMBULATORY_CARE_PROVIDER_SITE_OTHER): Payer: Self-pay

## 2014-08-20 NOTE — Telephone Encounter (Signed)
Pt calling for BE result. Since colonoscopy is recommended request for result will be sent to Dr Gerrit Friends for review. Also noted in allscript.

## 2014-08-23 NOTE — Progress Notes (Signed)
Quick Note:  Barium enema shows significant diverticular disease in sigmoid colon. With scattered diverticuli throughout colon. Patient has had colonoscopy so lesion in cecum likely stool or artifactual.  Will discuss option of sigmoid colectomy with patient. No obstruction or stricture noted.  Velora Heckler, MD, [facility] Office: [phone]   ______

## 2014-09-16 ENCOUNTER — Other Ambulatory Visit: Payer: Self-pay

## 2014-09-16 MED ORDER — OMEPRAZOLE 20 MG PO CPDR: 20.0000 mg | DELAYED_RELEASE_CAPSULE | Freq: Two times a day (BID) | ORAL | Status: DC

## 2014-11-09 ENCOUNTER — Encounter: Payer: Self-pay | Admitting: Gastroenterology

## 2015-05-13 ENCOUNTER — Telehealth: Payer: Self-pay | Admitting: Gastroenterology

## 2015-05-13 ENCOUNTER — Other Ambulatory Visit: Payer: Self-pay | Admitting: Gastroenterology

## 2015-05-13 MED ORDER — OMEPRAZOLE 20 MG PO CPDR: 20.0000 mg | DELAYED_RELEASE_CAPSULE | Freq: Two times a day (BID) | ORAL | Status: DC

## 2015-05-13 NOTE — Telephone Encounter (Signed)
Patient's wife Elnita Maxwell) called saying that her husband (patient) is out of his Omeprazole 20 mg that he take twice a day.  She would like a 30 day prescription called into [facility] and then have a 90 day supply mail order done through [facility]. She said last time with the mail order it was written for 40mg  and it needs to be written for 20mg  twice a day for a 90 day supply. She asked for the nurse that handles the mail order prescrptions to call [phone]

## 2015-05-13 NOTE — Telephone Encounter (Signed)
Done

## 2015-05-13 NOTE — Addendum Note (Signed)
Addended by: Tiffany Kocher on: 05/13/2015 10:07 AM   Modules accepted: Orders

## 2015-05-13 NOTE — Telephone Encounter (Signed)
Called and informed [name] that I would send this request to the refill box. NOTE: needs one Rx for 30 days and one for the mail order.

## 2015-05-14 NOTE — Telephone Encounter (Signed)
Cheryl is aware.

## 2016-04-20 ENCOUNTER — Other Ambulatory Visit: Payer: Self-pay | Admitting: Gastroenterology

## 2016-04-21 NOTE — Telephone Encounter (Signed)
RX sent. Needs OV with [facility] in next three months. Last seen in 2015.

## 2016-04-24 NOTE — Telephone Encounter (Signed)
LMOM for a return call.  

## 2016-04-25 NOTE — Telephone Encounter (Signed)
LMOM that he will need an OV and Rx was sent in.

## 2016-04-26 NOTE — Telephone Encounter (Signed)
ON RECALL  °

## 2016-06-08 ENCOUNTER — Ambulatory Visit: Payer: Managed Care, Other (non HMO) | Admitting: Gastroenterology

## 2016-06-20 ENCOUNTER — Encounter: Payer: Self-pay | Admitting: Gastroenterology

## 2016-07-27 ENCOUNTER — Encounter: Payer: Self-pay | Admitting: Gastroenterology

## 2016-07-27 ENCOUNTER — Ambulatory Visit (INDEPENDENT_AMBULATORY_CARE_PROVIDER_SITE_OTHER): Payer: Managed Care, Other (non HMO) | Admitting: Gastroenterology

## 2016-07-27 DIAGNOSIS — K5732 Diverticulitis of large intestine without perforation or abscess without bleeding: Secondary | ICD-10-CM

## 2016-07-27 DIAGNOSIS — B3781 Candidal esophagitis: Secondary | ICD-10-CM

## 2016-07-27 DIAGNOSIS — K222 Esophageal obstruction: Secondary | ICD-10-CM | POA: Diagnosis not present

## 2016-07-27 DIAGNOSIS — Z1211 Encounter for screening for malignant neoplasm of colon: Secondary | ICD-10-CM

## 2016-07-27 NOTE — Assessment & Plan Note (Signed)
COLONOSCOPY UP TO DATE. LAST 2015.

## 2016-07-27 NOTE — Assessment & Plan Note (Signed)
SYMPTOMS CONTROLLED/RESOLVED.  CONTINUE TO MONITOR SYMPTOMS. 

## 2016-07-27 NOTE — Patient Instructions (Addendum)
DRINK WATER TO KEEP YOUR URINE LIGHT YELLOW.  FOLLOW A HIGH FIBER DIET. AVOID ITEMS THAT CAUSE BLOATING & GAS. [name]  FOLLOW UP IN 1-2 YEAR.

## 2016-07-27 NOTE — Assessment & Plan Note (Signed)
NO ADDITIONAL EPISODE.  DRINK WATER EAT FIBER. FOLLOW UP IN 1-2 YEAR.

## 2016-07-27 NOTE — Progress Notes (Signed)
Subjective:    Patient ID: [name], male    DOB: April 25, 1951, 65 y.o.   MRN: [id number] Cassell Smiles., MD  HPI BEEN DOING GOOD. EYES CHECKED TUES. SEEING ENDO NEXT MO. [facility] AT BEGINNING 2017 AND ITS OK. NO ADDITIONAL DIVERTICULITIS.   PT DENIES FEVER, CHILLS, HEMATOCHEZIA, HEMATEMESIS, nausea, vomiting, melena, diarrhea, CHEST PAIN, SHORTNESS OF BREATH,  CHANGE IN BOWEL IN HABITS, constipation, abdominal pain, problems swallowing, problems with sedation, heartburn or indigestion.  Past Medical History:  Diagnosis Date  . Candida esophagitis OCT 2014  . Deafness in right ear   . Esophageal stricture SEP 2011   FOOD IMPACTION-->SAV DIL 16 MM  . GERD (gastroesophageal reflux disease)   . Hypertension   . Kidney stone   . Myasthenia gravis   . Thyroid disease     Past Surgical History:  Procedure Laterality Date  . CHOLECYSTECTOMY    . COLONOSCOPY  2005   Dr. Lovell Sheehan  . COLONOSCOPY N/A 07/17/2014   Procedure: COLONOSCOPY;  Surgeon: West Bali, MD;  Location: [facility];  Service: Endoscopy;  Laterality: N/A;  9:30-rescheduled 8/28 same time Soledad Gerlach to notify pt  . ESOPHAGOGASTRODUODENOSCOPY N/A 07/25/2013   Shallow ulcer in the cardia, mid esophageal web, stricture at GE junction  . ESOPHAGOGASTRODUODENOSCOPY (EGD) WITH ESOPHAGEAL DILATION N/A 08/29/2013   Candida esophagitis, small hiatal hernia, moderate nonerosive gastritis, mid esophageal web  . HERNIA REPAIR     right inguinal  . KIDNEY STONE SURGERY    . NASAL SEPTUM SURGERY    . PALATE SURGERY        . THYMECTOMY    . TONSILLECTOMY    . UPPER GASTROINTESTINAL ENDOSCOPY  SEP 2011   SAV DIL    Allergies  Allergen Reactions  . Levofloxacin Anaphylaxis  . Tequin [Gatifloxacin] Anaphylaxis  . Aminoglycosides Other (See Comments)    REACTION: Myasthenia Gravis (MG) Medication Alert  . Avelox [Moxifloxacin Hcl In Nacl] Other (See Comments)    REACTION: FLUOROQUINOLONE ANTIBIOTICS: Myasthenia Gravis  (MG) Medication Alert  . Botox [Onabotulinumtoxina] Other (See Comments)    REACTION: Myasthenia Gravis (MG) Medication Alert  . Botulinum Toxins Other (See Comments)    REACTION: Myasthenia Gravis (MG) Medication Alert  . Calcium Channel Blockers Other (See Comments)    (BLOOD PRESSURE MEDICATION) REACTION: Myasthenia Gravis (MG) Medication Alert  . Ciprofloxacin Other (See Comments)    REACTION: FLUOROQUINOLONE ANTIBIOTICS: Myasthenia Gravis (MG) Medication Alert  . Curare [Tubocurarine] Other (See Comments)    (Usually only used during surgery)  REACTION: Myasthenia Gravis (MG) Medication Alert  . Dysport [Abobotulinumtoxina] Other (See Comments)    REACTION: Myasthenia Gravis (MG) Medication Alert  . Erythromycin Other (See Comments)    REACTION: Myasthenia Gravis (MG) Medication Alert  . Factive [Gemifloxacin] Other (See Comments)    REACTION: FLUOROQUINOLONE ANTIBIOTICS: Myasthenia Gravis (MG) Medication Alert  . Floxin [Ofloxacin] Other (See Comments)    REACTION: FLUOROQUINOLONE ANTIBIOTICS: Myasthenia Gravis (MG) Medication Alert  . Gentamycin [Gentamicin] Other (See Comments)    REACTION:Myasthenia Gravis (MG) Medication Alert  . Interferons Other (See Comments)    REACTION: Myasthenia Gravis (MG) Medication Alert  . Kanamycin Other (See Comments)    REACTION: Myasthenia Gravis (MG) Medication Alert  . Ketek [Telithromycin] Other (See Comments)    REACTION: Myasthenia Gravis (MG) Medication Alert  . Levaquin [Levofloxacin In D5w] Other (See Comments)    REACTION: FLUOROQUINOLONE ANTIBIOTICS: Myasthenia Gravis (MG) Medication Alert  . Macrolides And Ketolides Other (See Comments)  REACTION: Myasthenia Gravis (MG) Medication Alert  . Magnesium-Containing Compounds   . Myobloc [Rimabotulinumtoxinb] Other (See Comments)    REACTION: Myasthenia Gravis (MG) Medication Alert  . Neomycin Other (See Comments)    REACTION: Myasthenia Gravis (MG) Medication Alert  . Noroxin  [Norfloxacin] Other (See Comments)    REACTION: FLUOROQUINOLONE ANTIBIOTICS: Myasthenia Gravis (MG) Medication Alert  . Other Other (See Comments)    MAGNESIUM SALTS: Milk of Magnesia, some antacids (Maalox, Mylanta) REACTION: Myasthenia Gravis (MG) Medication Alerta  . Penicillamine Other (See Comments)    D-PENICILLAMINE *DO NOT CONFUSE WITH PENICILLIN*  REACTION: Myasthenia Gravis (MG) Medication Alert  . Procainamide Other (See Comments)    REACTION: Myasthenia Gravis (MG) Medication Alert  . Propranolol Other (See Comments)    REACTION: Myasthenia Gravis (MG) Medication Alert  . Quinidine Other (See Comments)    REACTION: Myasthenia Gravis (MG) Medication Alert  . Quinine Derivatives Other (See Comments)    REACTION: Myasthenia Gravis (MG) Medication Alert  . Streptomycin Other (See Comments)    REACTION:  Myasthenia Gravis (MG) Medication Alert  . Timolol Other (See Comments)    REACTION: Myasthenia Gravis (MG) Medication Alert  . Tobramycin     REACTION: Myasthenia Gravis (MG) Medication Alert  . Xeomin [Incobotulinumtoxina] Other (See Comments)    REACTION: Myasthenia Gravis (MG) Medication Alert  . Zithromax [Azithromycin] Other (See Comments)    REACTION: Myasthenia Gravis (MG) Medication Alert    Current Outpatient Prescriptions  Medication Sig Dispense Refill  . acyclovir (ZOVIRAX) 400 MG tablet Take 400 mg by mouth every morning.     [name]. azaTHIOprine (IMURAN) 50 MG tablet Take 150 mg by mouth every morning.     . Calcium Carbonate-Vitamin D (CALCIUM 600+D) 600-200 MG-UNIT TABS Take 2 tablets by mouth every morning.     . metFORMIN (GLUCOPHAGE-XR) 500 MG 24 hr tablet Take 500 mg by mouth 2 (two) times daily.     . Omega-3 Fatty Acids (FISH OIL) 1200 MG CAPS Take 2 capsules by mouth every morning.     [name]. omeprazole (PRILOSEC) 20 MG capsule TAKE 1 CAPSULE BY MOUTH TWICE DAILY    . SYNTHROID 137 MCG tablet Take 137 mcg by mouth daily before breakfast.     . valsartan  (DIOVAN) 80 MG tablet Take 80 mg by mouth daily.    . vitamin C (ASCORBIC ACID) 500 MG tablet Take 1,000 mg by mouth every morning.     . vitamin E 400 UNIT capsule Take 800 Units by mouth every morning.     . ondansetron (ZOFRAN-ODT) 4 MG disintegrating tablet Take 1 tablet by mouth every 4 (four) hours as needed. For nausea/vomiting    . pyridostigmine (MESTINON) 60 MG tablet Take 60 mg by mouth every morning.      Review of Systems PER HPI OTHERWISE ALL SYSTEMS ARE NEGATIVE.    Objective:   Physical Exam  Constitutional: He is oriented to person, place, and time. He appears well-developed and well-nourished. No distress.  HENT:  Head: Normocephalic and atraumatic.  Mouth/Throat: Oropharynx is clear and moist. No oropharyngeal exudate.  Eyes: Pupils are equal, round, and reactive to light. No scleral icterus.  Neck: Normal range of motion. Neck supple.  Cardiovascular: Normal rate, regular rhythm and normal heart sounds.   Pulmonary/Chest: Effort normal and breath sounds normal. No respiratory distress.  Abdominal: Soft. Bowel sounds are normal. He exhibits no distension. There is no tenderness.  Musculoskeletal: He exhibits no edema.  Lymphadenopathy:    He has  no cervical adenopathy.  Neurological: He is alert and oriented to person, place, and time.  NO FOCAL DEFICITS  Psychiatric: He has a normal mood and affect.  Vitals reviewed.     Assessment & Plan:

## 2016-07-27 NOTE — Assessment & Plan Note (Signed)
NO DYSPHAGIA. SYMPTOMS CONTROLLED/RESOLVED ON IMURAN.   CONTINUE TO MONITOR SYMPTOMS.

## 2017-02-28 ENCOUNTER — Other Ambulatory Visit: Payer: Self-pay | Admitting: Gastroenterology

## 2017-03-03 ENCOUNTER — Ambulatory Visit (INDEPENDENT_AMBULATORY_CARE_PROVIDER_SITE_OTHER): Payer: Self-pay | Admitting: Family

## 2017-03-03 ENCOUNTER — Encounter: Payer: Self-pay | Admitting: Family

## 2017-03-03 VITALS — BP 128/74 | HR 94 | Temp 98.9°F | Wt 217.4 lb

## 2017-03-03 DIAGNOSIS — R05 Cough: Secondary | ICD-10-CM | POA: Insufficient documentation

## 2017-03-03 DIAGNOSIS — J028 Acute pharyngitis due to other specified organisms: Principal | ICD-10-CM

## 2017-03-03 DIAGNOSIS — R059 Cough, unspecified: Secondary | ICD-10-CM | POA: Insufficient documentation

## 2017-03-03 DIAGNOSIS — B9689 Other specified bacterial agents as the cause of diseases classified elsewhere: Secondary | ICD-10-CM

## 2017-03-03 MED ORDER — PREDNISONE 5 MG PO TABS: 5.0000 mg | ORAL_TABLET | ORAL | 0 refills | Status: DC

## 2017-03-03 MED ORDER — GUAIFENESIN ER 600 MG PO TB12: 600.0000 mg | ORAL_TABLET | Freq: Two times a day (BID) | ORAL | 0 refills | Status: AC

## 2017-03-03 MED ORDER — DOXYCYCLINE HYCLATE 100 MG PO TABS: 100.0000 mg | ORAL_TABLET | Freq: Two times a day (BID) | ORAL | 0 refills | Status: DC

## 2017-03-03 NOTE — Progress Notes (Signed)
[name]  Chief Complaint  Patient presents with  . cough and congestion      ICD-9-CM ICD-10-CM   1. Bacterial pharyngitis 462 J02.8 doxycycline (VIBRA-TABS) 100 MG tablet    B96.89 guaiFENesin (MUCINEX) 600 MG 12 hr tablet     predniSONE (DELTASONE) 5 MG tablet  2. Cough 786.2 R05 doxycycline (VIBRA-TABS) 100 MG tablet     guaiFENesin (MUCINEX) 600 MG 12 hr tablet     predniSONE (DELTASONE) 5 MG tablet    Patient Active Problem List   Diagnosis Date Noted  . Cough 03/03/2017  . Bacterial pharyngitis 03/03/2017  . Colon cancer screening 06/03/2014  . Diverticulitis large intestine 04/01/2014  . Diarrhea 04/01/2014  . Candida esophagitis (HCC) 11/26/2013  . UTI (lower urinary tract infection) 06/07/2012  . Myasthenia gravis (HCC)   . Hypertension   . Esophageal stricture 07/21/2010    Past Medical History:  Diagnosis Date  . Candida esophagitis (HCC) OCT 2014  . Deafness in right ear   . Esophageal stricture SEP 2011   FOOD IMPACTION-->SAV DIL 16 MM  . GERD (gastroesophageal reflux disease)   . Hypertension   . Kidney stone   . Myasthenia gravis (HCC)   . Thyroid disease     Past Surgical History:  Procedure Laterality Date  . CHOLECYSTECTOMY    . COLONOSCOPY  2005   Dr. Lovell Sheehan  . COLONOSCOPY N/A 07/17/2014   Procedure: COLONOSCOPY;  Surgeon: West Bali, MD;  Location: [facility];  Service: Endoscopy;  Laterality: N/A;  9:30-rescheduled 8/28 same time Soledad Gerlach to notify pt  . ESOPHAGOGASTRODUODENOSCOPY N/A 07/25/2013   Shallow ulcer in the cardia, mid esophageal web, stricture at GE junction  . ESOPHAGOGASTRODUODENOSCOPY (EGD) WITH ESOPHAGEAL DILATION N/A 08/29/2013   Candida esophagitis, small hiatal hernia, moderate nonerosive gastritis, mid esophageal web  . HERNIA REPAIR     right inguinal  . KIDNEY STONE SURGERY    . NASAL SEPTUM SURGERY    . PALATE SURGERY        . THYMECTOMY    . TONSILLECTOMY    . UPPER GASTROINTESTINAL ENDOSCOPY  SEP  2011   SAV DIL    Allergies  Allergen Reactions  . Levofloxacin Anaphylaxis  . Tequin [Gatifloxacin] Anaphylaxis  . Aminoglycosides Other (See Comments)    REACTION: Myasthenia Gravis (MG) Medication Alert  . Avelox [Moxifloxacin Hcl In Nacl] Other (See Comments)    REACTION: FLUOROQUINOLONE ANTIBIOTICS: Myasthenia Gravis (MG) Medication Alert  . Botox [Onabotulinumtoxina] Other (See Comments)    REACTION: Myasthenia Gravis (MG) Medication Alert  . Botulinum Toxins Other (See Comments)    REACTION: Myasthenia Gravis (MG) Medication Alert  . Calcium Channel Blockers Other (See Comments)    (BLOOD PRESSURE MEDICATION) REACTION: Myasthenia Gravis (MG) Medication Alert  . Ciprofloxacin Other (See Comments)    REACTION: FLUOROQUINOLONE ANTIBIOTICS: Myasthenia Gravis (MG) Medication Alert  . Curare [Tubocurarine] Other (See Comments)    (Usually only used during surgery)  REACTION: Myasthenia Gravis (MG) Medication Alert  . Dysport [Abobotulinumtoxina] Other (See Comments)    REACTION: Myasthenia Gravis (MG) Medication Alert  . Erythromycin Other (See Comments)    REACTION: Myasthenia Gravis (MG) Medication Alert  . Factive [Gemifloxacin] Other (See Comments)    REACTION: FLUOROQUINOLONE ANTIBIOTICS: Myasthenia Gravis (MG) Medication Alert  . Floxin [Ofloxacin] Other (See Comments)    REACTION: FLUOROQUINOLONE ANTIBIOTICS: Myasthenia Gravis (MG) Medication Alert  . Gentamycin [Gentamicin] Other (See Comments)    REACTION:Myasthenia Gravis (MG) Medication Alert  . Interferons Other (  See Comments)    REACTION: Myasthenia Gravis (MG) Medication Alert  . Kanamycin Other (See Comments)    REACTION: Myasthenia Gravis (MG) Medication Alert  . Ketek [Telithromycin] Other (See Comments)    REACTION: Myasthenia Gravis (MG) Medication Alert  . Levaquin [Levofloxacin In D5w] Other (See Comments)    REACTION: FLUOROQUINOLONE ANTIBIOTICS: Myasthenia Gravis (MG) Medication Alert  . Macrolides  And Ketolides Other (See Comments)    REACTION: Myasthenia Gravis (MG) Medication Alert  . Magnesium-Containing Compounds   . Myobloc [Rimabotulinumtoxinb] Other (See Comments)    REACTION: Myasthenia Gravis (MG) Medication Alert  . Neomycin Other (See Comments)    REACTION: Myasthenia Gravis (MG) Medication Alert  . Noroxin [Norfloxacin] Other (See Comments)    REACTION: FLUOROQUINOLONE ANTIBIOTICS: Myasthenia Gravis (MG) Medication Alert  . Other Other (See Comments)    MAGNESIUM SALTS: Milk of Magnesia, some antacids (Maalox, Mylanta) REACTION: Myasthenia Gravis (MG) Medication Alerta  . Penicillamine Other (See Comments)    D-PENICILLAMINE *DO NOT CONFUSE WITH PENICILLIN*  REACTION: Myasthenia Gravis (MG) Medication Alert  . Procainamide Other (See Comments)    REACTION: Myasthenia Gravis (MG) Medication Alert  . Propranolol Other (See Comments)    REACTION: Myasthenia Gravis (MG) Medication Alert  . Quinidine Other (See Comments)    REACTION: Myasthenia Gravis (MG) Medication Alert  . Quinine Derivatives Other (See Comments)    REACTION: Myasthenia Gravis (MG) Medication Alert  . Streptomycin Other (See Comments)    REACTION:  Myasthenia Gravis (MG) Medication Alert  . Timolol Other (See Comments)    REACTION: Myasthenia Gravis (MG) Medication Alert  . Tobramycin     REACTION: Myasthenia Gravis (MG) Medication Alert  . Xeomin [Incobotulinumtoxina] Other (See Comments)    REACTION: Myasthenia Gravis (MG) Medication Alert  . Zithromax [Azithromycin] Other (See Comments)    REACTION: Myasthenia Gravis (MG) Medication Alert    BP 128/74   Pulse 94   Temp 98.9 F (37.2 C)   Wt 217 lb 6.4 oz (98.6 kg)   SpO2 96%   BMI 28.68 kg/m   Review of Systems  Constitutional: Positive for malaise/fatigue.  HENT: Positive for congestion and sore throat. Negative for ear discharge, ear pain, nosebleeds and sinus pain.   Eyes: Negative.   Respiratory: Positive for cough, sputum  production, shortness of breath and wheezing. Negative for stridor.   Cardiovascular: Negative.   Gastrointestinal: Negative.   Genitourinary: Negative.   Musculoskeletal: Negative.   Skin: Negative for rash.  Neurological: Positive for sensory change (chronic, has MG) and weakness.  Endo/Heme/Allergies: Negative.   Psychiatric/Behavioral: Negative.   All other systems reviewed and are negative.  Physical Exam  Constitutional: He appears well-developed and well-nourished.  Vitals reviewed.   No results found for this or any previous visit (from the past 72 hour(s)).   Current Outpatient Prescriptions:  .  acyclovir (ZOVIRAX) 400 MG tablet, Take 400 mg by mouth every morning. , Disp: , Rfl:  .  azaTHIOprine (IMURAN) 50 MG tablet, Take 150 mg by mouth every morning. , Disp: , Rfl:  .  Calcium Carbonate-Vitamin D (CALCIUM 600+D) 600-200 MG-UNIT TABS, Take 2 tablets by mouth every morning. , Disp: , Rfl:  .  doxycycline (VIBRA-TABS) 100 MG tablet, Take 1 tablet (100 mg total) by mouth 2 (two) times daily., Disp: 20 tablet, Rfl: 0 .  guaiFENesin (MUCINEX) 600 MG 12 hr tablet, Take 1 tablet (600 mg total) by mouth 2 (two) times daily., Disp: 10 tablet, Rfl: 0 .  metFORMIN (GLUCOPHAGE-XR) 500 MG  24 hr tablet, Take 500 mg by mouth 2 (two) times daily. , Disp: , Rfl:  .  Omega-3 Fatty Acids (FISH OIL) 1200 MG CAPS, Take 2 capsules by mouth every morning. , Disp: , Rfl:  .  omeprazole (PRILOSEC) 20 MG capsule, TAKE 1 CAPSULE BY MOUTH TWICE DAILY, Disp: 180 capsule, Rfl: 1 .  ondansetron (ZOFRAN-ODT) 4 MG disintegrating tablet, Take 1 tablet by mouth every 4 (four) hours as needed. For nausea/vomiting, Disp: , Rfl:  .  predniSONE (DELTASONE) 5 MG tablet, Take 1 tablet (5 mg total) by mouth as directed. sterapred generic 21 taper, Disp: 21 tablet, Rfl: 0 .  pyridostigmine (MESTINON) 60 MG tablet, Take 60 mg by mouth every morning. , Disp: , Rfl:  .  SYNTHROID 137 MCG tablet, Take 137 mcg by mouth  daily before breakfast. , Disp: , Rfl:  .  valsartan (DIOVAN) 80 MG tablet, Take 80 mg by mouth daily., Disp: , Rfl:  .  vitamin C (ASCORBIC ACID) 500 MG tablet, Take 1,000 mg by mouth every morning. , Disp: , Rfl:  .  vitamin E 400 UNIT capsule, Take 800 Units by mouth every morning. , Disp: , Rfl:   Subjective: "I have had a cough and green sputum is coming out, going on 8 days now"  Objective: Pt seen and chart reviewed. Pt is alert, oriented x3, calm, cooperative, in NAD. See objective details above.  Assessment:  -Bacterial pharyngitis  Plan:  -Doxycycline 100mg  po bid x 10 days -Sterapred 5mg  dose taper (21)    Beau Fanny, FNP 03/03/2017 2:25 PM

## 2017-04-18 ENCOUNTER — Other Ambulatory Visit (HOSPITAL_COMMUNITY): Payer: Self-pay | Admitting: Internal Medicine

## 2017-04-18 ENCOUNTER — Ambulatory Visit (HOSPITAL_COMMUNITY)
Admission: RE | Admit: 2017-04-18 | Discharge: 2017-04-18 | Disposition: A | Discharge status: Home / Self Care | Payer: Managed Care, Other (non HMO) | Source: Ambulatory Visit | Attending: Internal Medicine | Admitting: Internal Medicine

## 2017-04-18 DIAGNOSIS — R05 Cough: Secondary | ICD-10-CM

## 2017-04-18 DIAGNOSIS — R059 Cough, unspecified: Secondary | ICD-10-CM

## 2017-04-18 DIAGNOSIS — Z951 Presence of aortocoronary bypass graft: Secondary | ICD-10-CM | POA: Insufficient documentation

## 2017-05-21 ENCOUNTER — Ambulatory Visit (INDEPENDENT_AMBULATORY_CARE_PROVIDER_SITE_OTHER): Payer: Managed Care, Other (non HMO) | Admitting: Otolaryngology

## 2017-05-21 DIAGNOSIS — R070 Pain in throat: Secondary | ICD-10-CM | POA: Diagnosis not present

## 2017-05-21 DIAGNOSIS — J310 Chronic rhinitis: Secondary | ICD-10-CM | POA: Diagnosis not present

## 2017-06-01 ENCOUNTER — Emergency Department (HOSPITAL_COMMUNITY): Payer: Managed Care, Other (non HMO)

## 2017-06-01 ENCOUNTER — Emergency Department (HOSPITAL_COMMUNITY)
Admission: EM | Admit: 2017-06-01 | Discharge: 2017-06-01 | Disposition: A | Discharge status: Home / Self Care | Payer: Managed Care, Other (non HMO) | Attending: Emergency Medicine | Admitting: Emergency Medicine

## 2017-06-01 DIAGNOSIS — Z7984 Long term (current) use of oral hypoglycemic drugs: Secondary | ICD-10-CM | POA: Insufficient documentation

## 2017-06-01 DIAGNOSIS — I10 Essential (primary) hypertension: Secondary | ICD-10-CM | POA: Insufficient documentation

## 2017-06-01 DIAGNOSIS — N12 Tubulo-interstitial nephritis, not specified as acute or chronic: Secondary | ICD-10-CM | POA: Diagnosis not present

## 2017-06-01 DIAGNOSIS — Z79899 Other long term (current) drug therapy: Secondary | ICD-10-CM | POA: Diagnosis not present

## 2017-06-01 DIAGNOSIS — R509 Fever, unspecified: Secondary | ICD-10-CM | POA: Diagnosis present

## 2017-06-01 LAB — URINALYSIS, ROUTINE W REFLEX MICROSCOPIC
Bilirubin Urine: NEGATIVE
Glucose, UA: NEGATIVE mg/dL
Hgb urine dipstick: NEGATIVE
Ketones, ur: 5 mg/dL — AB
Nitrite: POSITIVE — AB
Protein, ur: NEGATIVE mg/dL
Specific Gravity, Urine: 1.011 (ref 1.005–1.030)
Squamous Epithelial / LPF: NONE SEEN
pH: 7 (ref 5.0–8.0)

## 2017-06-01 LAB — COMPREHENSIVE METABOLIC PANEL
ALT: 31 U/L (ref 17–63)
AST: 39 U/L (ref 15–41)
Albumin: 4.3 g/dL (ref 3.5–5.0)
Alkaline Phosphatase: 69 U/L (ref 38–126)
Anion gap: 14 (ref 5–15)
BUN: 14 mg/dL (ref 6–20)
CO2: 22 mmol/L (ref 22–32)
Calcium: 9.4 mg/dL (ref 8.9–10.3)
Chloride: 102 mmol/L (ref 101–111)
Creatinine, Ser: 1.05 mg/dL (ref 0.61–1.24)
GFR calc Af Amer: >60 mL/min (ref >60)
GFR calc non Af Amer: >60 mL/min (ref >60)
Glucose, Bld: 209 mg/dL — ABNORMAL HIGH (ref 65–99)
Potassium: 4 mmol/L (ref 3.5–5.1)
Sodium: 138 mmol/L (ref 135–145)
Total Bilirubin: 1.7 mg/dL — ABNORMAL HIGH (ref 0.3–1.2)
Total Protein: 7.5 g/dL (ref 6.5–8.1)

## 2017-06-01 LAB — I-STAT CG4 LACTIC ACID, ED
Lactic Acid, Venous: 3.01 mmol/L (ref 0.5–1.9)
Lactic Acid, Venous: 1.55 mmol/L (ref 0.5–1.9)

## 2017-06-01 LAB — CBC WITH DIFFERENTIAL/PLATELET
Basophils Absolute: 0 10*3/uL (ref 0.0–0.1)
Basophils Relative: 0 %
Eosinophils Absolute: 0 10*3/uL (ref 0.0–0.7)
Eosinophils Relative: 0 %
HCT: 41.5 % (ref 39.0–52.0)
Hemoglobin: 14.1 g/dL (ref 13.0–17.0)
Lymphocytes Relative: 4 %
Lymphs Abs: 0.3 10*3/uL — ABNORMAL LOW (ref 0.7–4.0)
MCH: 31.7 pg (ref 26.0–34.0)
MCHC: 34 g/dL (ref 30.0–36.0)
MCV: 93.3 fL (ref 78.0–100.0)
Monocytes Absolute: 0.1 10*3/uL (ref 0.1–1.0)
Monocytes Relative: 2 %
Neutro Abs: 5.8 10*3/uL (ref 1.7–7.7)
Neutrophils Relative %: 94 %
Platelets: 174 10*3/uL (ref 150–400)
RBC: 4.45 MIL/uL (ref 4.22–5.81)
RDW: 14.2 % (ref 11.5–15.5)
WBC: 6.2 10*3/uL (ref 4.0–10.5)

## 2017-06-01 MED ORDER — SODIUM CHLORIDE 0.9 % IV SOLN
1000.0000 mL | INTRAVENOUS | Status: DC
  Administered 2017-06-01: 1000 mL via INTRAVENOUS

## 2017-06-01 MED ORDER — ACETAMINOPHEN 500 MG PO TABS
1000.0000 mg | ORAL_TABLET | Freq: Once | ORAL | Status: AC
  Administered 2017-06-01: 1000 mg via ORAL
  Filled 2017-06-01: qty 2

## 2017-06-01 MED ORDER — DEXTROSE 5 % IV SOLN
2.0000 g | Freq: Once | INTRAVENOUS | Status: AC
  Administered 2017-06-01: 2 g via INTRAVENOUS
  Filled 2017-06-01: qty 2

## 2017-06-01 MED ORDER — ACETAMINOPHEN 500 MG PO TABS: 1000.0000 mg | ORAL_TABLET | Freq: Once | ORAL | Status: DC

## 2017-06-01 MED ORDER — CEPHALEXIN 500 MG PO CAPS: 500.0000 mg | ORAL_CAPSULE | Freq: Three times a day (TID) | ORAL | 0 refills | Status: AC

## 2017-06-01 NOTE — ED Provider Notes (Signed)
[facility] Provider Note   CSN: [id number] Arrival date & time: 06/01/17  7829     History   Chief Complaint Chief Complaint  Patient presents with  . Fever    HPI [name] is a 66 y.o. male.  HPI   66 y/o - hx of MG, and DM, temp of 103,  Awoke this AM and took shower - had some lower back aching - no radiation No numbness, weakness or trouble with urination - 3/10 back pain - then when he was brushing teeth, felt shivers - rested, then vomitted greenish-brown vomitn - had UTI 2-3 weeks ago - a couple of months ago had a chest infection s/p antibiotics.  Sx are constant, moderate, nothing makes better or worse, no assocaited coughing.    The patient also reports that he was recently traveling to [place], he has not had any ticks on his skin, denies any rashes, denies diarrhea, denies abdominal pain coughing shortness of breath or headaches. He does report having a recent sore throat which was evaluated by ear nose and throat by a scope, this did not show any specific findings, thought to be more environmental.  Past Medical History:  Diagnosis Date  . Candida esophagitis (HCC) OCT 2014  . Deafness in right ear   . Esophageal stricture SEP 2011   FOOD IMPACTION-->SAV DIL 16 MM  . GERD (gastroesophageal reflux disease)   . Hypertension   . Kidney stone   . Myasthenia gravis (HCC)   . Thyroid disease     Patient Active Problem List   Diagnosis Date Noted  . Cough 03/03/2017  . Bacterial pharyngitis 03/03/2017  . Colon cancer screening 06/03/2014  . Diverticulitis large intestine 04/01/2014  . Diarrhea 04/01/2014  . Candida esophagitis (HCC) 11/26/2013  . UTI (lower urinary tract infection) 06/07/2012  . Myasthenia gravis (HCC)   . Hypertension   . Esophageal stricture 07/21/2010    Past Surgical History:  Procedure Laterality Date  . CHOLECYSTECTOMY    . COLONOSCOPY  2005   Dr. Lovell Sheehan  . COLONOSCOPY N/A 07/17/2014   Procedure: COLONOSCOPY;   Surgeon: West Bali, MD;  Location: [facility];  Service: Endoscopy;  Laterality: N/A;  9:30-rescheduled 8/28 same time Soledad Gerlach to notify pt  . ESOPHAGOGASTRODUODENOSCOPY N/A 07/25/2013   Shallow ulcer in the cardia, mid esophageal web, stricture at GE junction  . ESOPHAGOGASTRODUODENOSCOPY (EGD) WITH ESOPHAGEAL DILATION N/A 08/29/2013   Candida esophagitis, small hiatal hernia, moderate nonerosive gastritis, mid esophageal web  . HERNIA REPAIR     right inguinal  . KIDNEY STONE SURGERY    . NASAL SEPTUM SURGERY    . PALATE SURGERY        . THYMECTOMY    . TONSILLECTOMY    . UPPER GASTROINTESTINAL ENDOSCOPY  SEP 2011   SAV DIL       Home Medications    Prior to Admission medications   Medication Sig Start Date End Date Taking? Authorizing Provider  acyclovir (ZOVIRAX) 400 MG tablet Take 400 mg by mouth every morning.    Yes [provider]  azaTHIOprine (IMURAN) 50 MG tablet Take 150 mg by mouth every morning.    Yes [provider]  Calcium Carbonate-Vitamin D (CALCIUM 600+D) 600-200 MG-UNIT TABS Take 2 tablets by mouth every morning.    Yes [provider]  ipratropium (ATROVENT) 0.06 % nasal spray Place 2 sprays into both nostrils 2 (two) times daily. 05/21/17  Yes [provider]  metFORMIN (  GLUCOPHAGE-XR) 500 MG 24 hr tablet Take 500 mg by mouth 2 (two) times daily.  06/30/13  Yes [provider]  Omega-3 Fatty Acids (FISH OIL) 1200 MG CAPS Take 2 capsules by mouth every morning.    Yes [provider]  omeprazole (PRILOSEC) 20 MG capsule TAKE 1 CAPSULE BY MOUTH TWICE DAILY 03/01/17  Yes Tiffany Kocher, PA-C  SYNTHROID 137 MCG tablet Take 137 mcg by mouth daily before breakfast.  07/03/13  Yes [provider]  valsartan (DIOVAN) 80 MG tablet Take 80 mg by mouth daily.   Yes [provider]  vitamin C (ASCORBIC ACID) 500 MG tablet Take 1,000 mg by mouth every morning.    Yes [provider]    vitamin E 400 UNIT capsule Take 800 Units by mouth every morning.    Yes [provider]  cephALEXin (KEFLEX) 500 MG capsule Take 1 capsule (500 mg total) by mouth 3 (three) times daily. 06/01/17 06/11/17  Eber Hong, MD    Family History Family History  Problem Relation Age of Onset  . Colon cancer Neg Hx   . Liver disease Father        age 79, deceased    Social History Social History  Substance Use Topics  . Smoking status: Never Smoker  . Smokeless tobacco: Never Used  . Alcohol use No     Allergies   Levofloxacin; Tequin [gatifloxacin]; Aminoglycosides; Avelox [moxifloxacin hcl in nacl]; Botox [onabotulinumtoxina]; Botulinum toxins; Calcium channel blockers; Ciprofloxacin; Curare [tubocurarine]; Dysport [abobotulinumtoxina]; Erythromycin; Factive [gemifloxacin]; Floxin [ofloxacin]; Gentamycin [gentamicin]; Interferons; Kanamycin; Ketek [telithromycin]; Levaquin [levofloxacin in d5w]; Macrolides and ketolides; Magnesium-containing compounds; Myobloc [rimabotulinumtoxinb]; Neomycin; Noroxin [norfloxacin]; Other; Penicillamine; Procainamide; Propranolol; Quinidine; Quinine derivatives; Streptomycin; Timolol; Tobramycin; Xeomin [incobotulinumtoxina]; and Zithromax [azithromycin]   Review of Systems Review of Systems  All other systems reviewed and are negative.    Physical Exam Updated Vital Signs BP 116/72   Pulse 83   Temp 100 F (37.8 C) (Oral)   Resp 20   Ht 6\' 1"  (1.854 m)   Wt 96.2 kg (212 lb)   SpO2 98%   BMI 27.97 kg/m   Physical Exam  Constitutional: He appears well-developed and well-nourished. No distress.  HENT:  Head: Normocephalic and atraumatic.  Mouth/Throat: Oropharynx is clear and moist. No oropharyngeal exudate.  Eyes: Pupils are equal, round, and reactive to light. Conjunctivae and EOM are normal. Right eye exhibits no discharge. Left eye exhibits no discharge. No scleral icterus.  Neck: Normal range of motion. Neck supple. No JVD  present. No thyromegaly present.  Cardiovascular: Regular rhythm, normal heart sounds and intact distal pulses.  Exam reveals no gallop and no friction rub.   No murmur heard. Tachycardia, fever  Pulmonary/Chest: Effort normal and breath sounds normal. No respiratory distress. He has no wheezes. He has no rales.  Abdominal: Soft. Bowel sounds are normal. He exhibits no distension and no mass. There is no tenderness.  Musculoskeletal: Normal range of motion. He exhibits no edema or tenderness.  Lymphadenopathy:    He has no cervical adenopathy.  Neurological: He is alert. Coordination normal.  Skin: Skin is warm and dry. No rash noted. No erythema.  Psychiatric: He has a normal mood and affect. His behavior is normal.  Nursing note and vitals reviewed.    ED Treatments / Results  Labs (all labs ordered are listed, but only abnormal results are displayed) Labs Reviewed  COMPREHENSIVE METABOLIC PANEL - Abnormal; Notable for the following:  Result Value   Glucose, Bld 209 (*)    Total Bilirubin 1.7 (*)    All other components within normal limits  CBC WITH DIFFERENTIAL/PLATELET - Abnormal; Notable for the following:    Lymphs Abs 0.3 (*)    All other components within normal limits  URINALYSIS, ROUTINE W REFLEX MICROSCOPIC - Abnormal; Notable for the following:    Ketones, ur 5 (*)    Nitrite POSITIVE (*)    Leukocytes, UA TRACE (*)    Bacteria, UA RARE (*)    All other components within normal limits  I-STAT CG4 LACTIC ACID, ED - Abnormal; Notable for the following:    Lactic Acid, Venous 3.01 (*)    All other components within normal limits  CULTURE, BLOOD (ROUTINE X 2)  CULTURE, BLOOD (ROUTINE X 2)  URINE CULTURE  I-STAT CG4 LACTIC ACID, ED    EKG  EKG Interpretation  Date/Time:  Friday June 01 2017 09:48:53 EDT Ventricular Rate:  111 PR Interval:    QRS Duration: 84 QT Interval:  294 QTC Calculation: 400 R Axis:   58 Text Interpretation:  Sinus tachycardia  normal axis. Poor R wave progression Abnormal ekg Nonspecific T wave abnormality Since last tracing rate faster Confirmed by Eber Hong ([id number]) on 06/01/2017 10:08:29 AM       Radiology Dg Chest 2 View  Result Date: 06/01/2017 CLINICAL DATA:  Fever. EXAM: CHEST  2 VIEW COMPARISON:  Radiographs of Apr 18, 2017. FINDINGS: The heart size and mediastinal contours are within normal limits. Both lungs are clear. No pneumothorax or pleural effusion is noted. Sternotomy wires are noted. The visualized skeletal structures are unremarkable. IMPRESSION: No active cardiopulmonary disease. Electronically Signed   By: Lupita Raider, M.D.   On: 06/01/2017 10:14   Ct Renal Stone Study  Result Date: 06/01/2017 CLINICAL DATA:  Left flank pain EXAM: CT ABDOMEN AND PELVIS WITHOUT CONTRAST TECHNIQUE: Multidetector CT imaging of the abdomen and pelvis was performed following the standard protocol without IV contrast. COMPARISON:  06/10/2014 FINDINGS: Lower chest: Lung bases are essentially clear. Hepatobiliary: Mild hepatic steatosis. Status post cholecystectomy. No intrahepatic or extrahepatic duct dilatation. Pancreas: Within normal limits. Spleen: Within normal limits. 15 mm splenule in the left upper abdomen. Adrenals/Urinary Tract: Adrenal glands are within normal limits. Kidneys are within normal limits. 2 mm nonobstructing right lower pole renal calculus (series 2/ image 48). 3 mm nonobstructing left lower pole renal calculus (series 2/image 52). No ureteral or bladder calculi.  No hydronephrosis. Bladder is within normal limits. Stomach/Bowel: Stomach is within normal limits. No evidence of bowel obstruction. Normal appendix (series 2/image 63). Left colonic diverticulosis, without evidence of diverticulitis. Vascular/Lymphatic: No evidence of abdominal aortic aneurysm. No suspicious abdominopelvic lymphadenopathy. Reproductive: Prostate is unremarkable. Other: No abdominopelvic ascites. Small fat containing left  inguinal hernia (series 2/image 93). Musculoskeletal: Degenerative changes of the visualized thoracolumbar spine. IMPRESSION: 2 mm nonobstructing right lower pole renal calculus. 3 mm nonobstructing left lower pole renal calculus. No ureteral or bladder calculi. No hydronephrosis. Sigmoid diverticulosis, without evidence of diverticulitis. Mild hepatic steatosis. Electronically Signed   By: Charline Bills M.D.   On: 06/01/2017 15:33    Procedures Procedures (including critical care time)  Medications Ordered in ED Medications  0.9 %  sodium chloride infusion (0 mLs Intravenous Stopped 06/01/17 1030)  acetaminophen (TYLENOL) tablet 1,000 mg (1,000 mg Oral Not Given 06/01/17 0909)  acetaminophen (TYLENOL) tablet 1,000 mg (1,000 mg Oral Given 06/01/17 0855)  cefTRIAXone (ROCEPHIN) 2 g in dextrose  5 % 50 mL IVPB (0 g Intravenous Stopped 06/01/17 1437)     Initial Impression / Assessment and Plan / ED Course  I have reviewed the triage vital signs and the nursing notes.  Pertinent labs & imaging results that were available during my care of the patient were reviewed by me and considered in my medical decision making (see chart for details).   The patient's exam is benign child the fever and tachycardia. Will need further evaluation to look for source of infection as he does meet sepsis criteria. IV fluids started, antipyretics.  UA with only possible source Pt is sx free after meds Abx given Fluids given Lactic acid improved Stable for d/c.  Keflex TID X 10 days Pt expressed understanding.  Final Clinical Impressions(s) / ED Diagnoses   Final diagnoses:  Pyelonephritis    New Prescriptions New Prescriptions   CEPHALEXIN (KEFLEX) 500 MG CAPSULE    Take 1 capsule (500 mg total) by mouth 3 (three) times daily.     Eber Hong, MD 06/01/17 1539

## 2017-06-01 NOTE — Discharge Instructions (Signed)
Keflex 3 times daily for 10 days ER for severe or worsening symptoms.

## 2017-06-01 NOTE — ED Triage Notes (Signed)
Woke up 6;30 dull low back aching, 30 minutes later, shacking, vomiting once bile, pt has tem 103 in triage

## 2017-06-03 LAB — URINE CULTURE: Culture: >=100000 — AB

## 2017-06-04 ENCOUNTER — Telehealth: Payer: Self-pay | Admitting: *Deleted

## 2017-06-04 NOTE — Telephone Encounter (Signed)
Post ED Visit - Positive Culture Follow-up  Culture report reviewed by antimicrobial stewardship pharmacist:  []  Enzo Bi, Pharm.D. []  Celedonio Miyamoto, Pharm.D., BCPS AQ-ID []  Garvin Fila, Pharm.D., BCPS [x]  Georgina Pillion, Pharm.D., BCPS []  [place], [place].D., BCPS, AAHIVP []  Estella Husk, Pharm.D., BCPS, AAHIVP []  Lysle Pearl, PharmD, BCPS []  Casilda Carls, PharmD, BCPS []  Pollyann Samples, PharmD, BCPS  Positive urine culture Treated with Cephalexin, organism sensitive to the same and no further patient follow-up is required at this time.  Virl Axe [facility] 06/04/2017, 9:57 AM

## 2017-06-06 LAB — CULTURE, BLOOD (ROUTINE X 2)
Culture: NO GROWTH
Culture: NO GROWTH
Special Requests: ADEQUATE
Special Requests: ADEQUATE

## 2017-07-26 ENCOUNTER — Other Ambulatory Visit: Payer: Self-pay | Admitting: Gastroenterology

## 2017-08-01 ENCOUNTER — Ambulatory Visit (INDEPENDENT_AMBULATORY_CARE_PROVIDER_SITE_OTHER): Payer: Managed Care, Other (non HMO) | Admitting: Urology

## 2017-08-01 ENCOUNTER — Other Ambulatory Visit (HOSPITAL_COMMUNITY)
Admission: AD | Admit: 2017-08-01 | Discharge: 2017-08-01 | Disposition: A | Discharge status: Home / Self Care | Payer: Managed Care, Other (non HMO) | Source: Skilled Nursing Facility | Attending: Urology | Admitting: Urology

## 2017-08-01 DIAGNOSIS — N3001 Acute cystitis with hematuria: Secondary | ICD-10-CM

## 2017-08-01 DIAGNOSIS — N390 Urinary tract infection, site not specified: Secondary | ICD-10-CM | POA: Insufficient documentation

## 2017-08-04 LAB — URINE CULTURE: Culture: >=100000 — AB

## 2017-08-29 ENCOUNTER — Ambulatory Visit (INDEPENDENT_AMBULATORY_CARE_PROVIDER_SITE_OTHER): Payer: Managed Care, Other (non HMO) | Admitting: Urology

## 2017-08-29 DIAGNOSIS — N3001 Acute cystitis with hematuria: Secondary | ICD-10-CM | POA: Diagnosis not present

## 2017-08-29 DIAGNOSIS — N401 Enlarged prostate with lower urinary tract symptoms: Secondary | ICD-10-CM

## 2017-09-04 ENCOUNTER — Ambulatory Visit (INDEPENDENT_AMBULATORY_CARE_PROVIDER_SITE_OTHER): Payer: Managed Care, Other (non HMO) | Admitting: Podiatry

## 2017-09-04 ENCOUNTER — Other Ambulatory Visit: Payer: Self-pay | Admitting: Podiatry

## 2017-09-04 ENCOUNTER — Encounter: Payer: Self-pay | Admitting: Podiatry

## 2017-09-04 ENCOUNTER — Ambulatory Visit (INDEPENDENT_AMBULATORY_CARE_PROVIDER_SITE_OTHER): Payer: Managed Care, Other (non HMO)

## 2017-09-04 VITALS — BP 144/75 | HR 79 | Resp 18

## 2017-09-04 DIAGNOSIS — M722 Plantar fascial fibromatosis: Secondary | ICD-10-CM | POA: Diagnosis not present

## 2017-09-04 MED ORDER — TRIAMCINOLONE ACETONIDE 10 MG/ML IJ SUSP
10.0000 mg | Freq: Once | INTRAMUSCULAR | Status: AC
  Administered 2017-09-04: 10 mg

## 2017-09-04 NOTE — Progress Notes (Signed)
Subjective:    Patient ID: [name], male    DOB: Aug 14, 1951, 66 y.o.   MRN: [id number]  HPI  *Goes by "[name]"  [name] Presents the office today for concerns of left heel pain which has been ongoing for 3 weeks. He states that he was helping with [facility] and he was watching swelling and on his feet more and he thinks is what aggravated his heel which started the pain. He denies any numbness or tingling. The pain does not wake him up at night. He states he has pain in the morning of his been sitting down for some time and stands back up. He denies any swelling or redness. He has been tried ice as well as taking ibuprofen which has been helping. He states overall the pain has improved but does continue. Has not concerns today  Review of Systems  All other systems reviewed and are negative.  Past Medical History:  Diagnosis Date  . Candida esophagitis (HCC) OCT 2014  . Deafness in right ear   . Esophageal stricture SEP 2011   FOOD IMPACTION-->SAV DIL 16 MM  . GERD (gastroesophageal reflux disease)   . Hypertension   . Kidney stone   . Myasthenia gravis (HCC)   . Thyroid disease     Past Surgical History:  Procedure Laterality Date  . CHOLECYSTECTOMY    . COLONOSCOPY  2005   Dr. Lovell Sheehan  . COLONOSCOPY N/A 07/17/2014   Procedure: COLONOSCOPY;  Surgeon: West Bali, MD;  Location: [facility];  Service: Endoscopy;  Laterality: N/A;  9:30-rescheduled 8/28 same time Soledad Gerlach to notify pt  . ESOPHAGOGASTRODUODENOSCOPY N/A 07/25/2013   Shallow ulcer in the cardia, mid esophageal web, stricture at GE junction  . ESOPHAGOGASTRODUODENOSCOPY (EGD) WITH ESOPHAGEAL DILATION N/A 08/29/2013   Candida esophagitis, small hiatal hernia, moderate nonerosive gastritis, mid esophageal web  . HERNIA REPAIR     right inguinal  . KIDNEY STONE SURGERY    . NASAL SEPTUM SURGERY    . PALATE SURGERY        . THYMECTOMY    . TONSILLECTOMY    . UPPER GASTROINTESTINAL ENDOSCOPY  SEP  2011   SAV DIL     Current Outpatient Prescriptions:  .  losartan (COZAAR) 100 MG tablet, , Disp: , Rfl:  .  tamsulosin (FLOMAX) 0.4 MG CAPS capsule, Take 0.4 mg by mouth., Disp: , Rfl:  .  acyclovir (ZOVIRAX) 400 MG tablet, Take 400 mg by mouth every morning. , Disp: , Rfl:  .  azaTHIOprine (IMURAN) 50 MG tablet, Take 150 mg by mouth every morning. , Disp: , Rfl:  .  Calcium Carbonate-Vitamin D (CALCIUM 600+D) 600-200 MG-UNIT TABS, Take 2 tablets by mouth every morning. , Disp: , Rfl:  .  ipratropium (ATROVENT) 0.06 % nasal spray, Place 2 sprays into both nostrils 2 (two) times daily., Disp: , Rfl:  .  metFORMIN (GLUCOPHAGE-XR) 500 MG 24 hr tablet, Take 500 mg by mouth 2 (two) times daily. , Disp: , Rfl:  .  Omega-3 Fatty Acids (FISH OIL) 1200 MG CAPS, Take 2 capsules by mouth every morning. , Disp: , Rfl:  .  omeprazole (PRILOSEC) 20 MG capsule, TAKE 1 CAPSULE BY MOUTH TWICE DAILY, Disp: 180 capsule, Rfl: 3 .  SYNTHROID 137 MCG tablet, Take 137 mcg by mouth daily before breakfast. , Disp: , Rfl:  .  valsartan (DIOVAN) 80 MG tablet, Take 80 mg by mouth daily., Disp: , Rfl:  .  vitamin  C (ASCORBIC ACID) 500 MG tablet, Take 1,000 mg by mouth every morning. , Disp: , Rfl:  .  vitamin E 400 UNIT capsule, Take 800 Units by mouth every morning. , Disp: , Rfl:   Allergies  Allergen Reactions  . Levofloxacin Anaphylaxis  . Tequin [Gatifloxacin] Anaphylaxis  . Aminoglycosides Other (See Comments)    REACTION: Myasthenia Gravis (MG) Medication Alert  . Avelox [Moxifloxacin Hcl In Nacl] Other (See Comments)    REACTION: FLUOROQUINOLONE ANTIBIOTICS: Myasthenia Gravis (MG) Medication Alert  . Botox [Onabotulinumtoxina] Other (See Comments)    REACTION: Myasthenia Gravis (MG) Medication Alert  . Botulinum Toxins Other (See Comments)    REACTION: Myasthenia Gravis (MG) Medication Alert  . Calcium Channel Blockers Other (See Comments)    (BLOOD PRESSURE MEDICATION) REACTION: Myasthenia Gravis (MG)  Medication Alert  . Ciprofloxacin Other (See Comments)    REACTION: FLUOROQUINOLONE ANTIBIOTICS: Myasthenia Gravis (MG) Medication Alert  . Curare [Tubocurarine] Other (See Comments)    (Usually only used during surgery)  REACTION: Myasthenia Gravis (MG) Medication Alert  . Dysport [Abobotulinumtoxina] Other (See Comments)    REACTION: Myasthenia Gravis (MG) Medication Alert  . Erythromycin Other (See Comments)    REACTION: Myasthenia Gravis (MG) Medication Alert  . Factive [Gemifloxacin] Other (See Comments)    REACTION: FLUOROQUINOLONE ANTIBIOTICS: Myasthenia Gravis (MG) Medication Alert  . Floxin [Ofloxacin] Other (See Comments)    REACTION: FLUOROQUINOLONE ANTIBIOTICS: Myasthenia Gravis (MG) Medication Alert  . Gentamycin [Gentamicin] Other (See Comments)    REACTION:Myasthenia Gravis (MG) Medication Alert  . Interferons Other (See Comments)    REACTION: Myasthenia Gravis (MG) Medication Alert  . Kanamycin Other (See Comments)    REACTION: Myasthenia Gravis (MG) Medication Alert  . Ketek [Telithromycin] Other (See Comments)    REACTION: Myasthenia Gravis (MG) Medication Alert  . Levaquin [Levofloxacin In D5w] Other (See Comments)    REACTION: FLUOROQUINOLONE ANTIBIOTICS: Myasthenia Gravis (MG) Medication Alert  . Macrolides And Ketolides Other (See Comments)    REACTION: Myasthenia Gravis (MG) Medication Alert  . Magnesium-Containing Compounds   . Myobloc [Rimabotulinumtoxinb] Other (See Comments)    REACTION: Myasthenia Gravis (MG) Medication Alert  . Neomycin Other (See Comments)    REACTION: Myasthenia Gravis (MG) Medication Alert  . Noroxin [Norfloxacin] Other (See Comments)    REACTION: FLUOROQUINOLONE ANTIBIOTICS: Myasthenia Gravis (MG) Medication Alert  . Other Other (See Comments)    MAGNESIUM SALTS: Milk of Magnesia, some antacids (Maalox, Mylanta) REACTION: Myasthenia Gravis (MG) Medication Alerta  . Penicillamine Other (See Comments)    D-PENICILLAMINE *DO NOT  CONFUSE WITH PENICILLIN*  REACTION: Myasthenia Gravis (MG) Medication Alert  . Procainamide Other (See Comments)    REACTION: Myasthenia Gravis (MG) Medication Alert  . Propranolol Other (See Comments)    REACTION: Myasthenia Gravis (MG) Medication Alert  . Quinidine Other (See Comments)    REACTION: Myasthenia Gravis (MG) Medication Alert  . Quinine Derivatives Other (See Comments)    REACTION: Myasthenia Gravis (MG) Medication Alert  . Streptomycin Other (See Comments)    REACTION:  Myasthenia Gravis (MG) Medication Alert  . Timolol Other (See Comments)    REACTION: Myasthenia Gravis (MG) Medication Alert  . Tobramycin     REACTION: Myasthenia Gravis (MG) Medication Alert  . Xeomin [Incobotulinumtoxina] Other (See Comments)    REACTION: Myasthenia Gravis (MG) Medication Alert  . Zithromax [Azithromycin] Other (See Comments)    REACTION: Myasthenia Gravis (MG) Medication Alert    Social History   Social History  . Marital status: Married    Spouse  name: N/A  . Number of children: N/A  . Years of education: N/A   Occupational History  . Not on file.   Social History Main Topics  . Smoking status: Never Smoker  . Smokeless tobacco: Never Used  . Alcohol use No  . Drug use: No  . Sexual activity: Yes    Partners: Female    Birth control/ protection: None     Comment: spouse   Other Topics Concern  . Not on file   Social History Narrative  . No narrative on file         Objective:   Physical Exam  General: AAO x3, NAD  Dermatological: Skin is warm, dry and supple bilateral. Nails x 10 are well manicured; remaining integument appears unremarkable at this time. There are no open sores, no preulcerative lesions, no rash or signs of infection present.  Vascular: Dorsalis Pedis artery and Posterior Tibial artery pedal pulses are 2/4 bilateral with immedate capillary fill time. Pedal hair growth present. No varicosities and no lower extremity edema present  bilateral. There is no pain with calf compression, swelling, warmth, erythema.   Neruologic: Grossly intact via light touch bilateral. Protective threshold with Semmes Wienstein monofilament intact to all pedal sites bilateral. Negative tinel sign.   Musculoskeletal: Tenderness to palpation along the plantar medial tubercle of the calcaneus at the insertion of plantar fascia on the left foot. There is no pain along the course of the plantar fascia within the arch of the foot. Plantar fascia appears to be intact. There is no pain with lateral compression of the calcaneus or pain with vibratory sensation. There is no pain along the course or insertion of the achilles tendon. No other areas of tenderness to bilateral lower extremities. Muscular strength 5/5 in all groups tested bilateral.  Gait: Unassisted, Nonantalgic.      Assessment & Plan:  66 year old male with left heel pain likely result of plantar fasciitis -Treatment options discussed including all alternatives, risks, and complications -Etiology of symptoms were discussed -X-rays were obtained and reviewed with the patient. Inferior calcaneal spurring is present. No evidence of acute fracture identified. -Patient elects to proceed with steroid injection into the left heel. Under sterile skin preparation, a total of 2.5cc of kenalog 10, 0.5% Marcaine plain, and 2% lidocaine plain were infiltrated into the symptomatic area without complication. A band-aid was applied. Patient tolerated the injection well without complication. Post-injection care with discussed with the patient. Discussed with the patient to ice the area over the next couple of days to help prevent a steroid flare. -NSAIDs prn -Plantar fascial brace -Stretching, icing exercises daily -Discussed shoe gear modifications and orthotics and discussed not to go barefoot even while at home. -Follow up in 3 weeks or sooner if needed. Call any questions or concerns in the meantime.    Ovid Curd, DPM

## 2017-09-04 NOTE — Patient Instructions (Signed)

## 2017-09-25 ENCOUNTER — Ambulatory Visit (INDEPENDENT_AMBULATORY_CARE_PROVIDER_SITE_OTHER): Payer: Managed Care, Other (non HMO) | Admitting: Podiatry

## 2017-09-25 DIAGNOSIS — M722 Plantar fascial fibromatosis: Secondary | ICD-10-CM

## 2017-09-26 NOTE — Addendum Note (Signed)
Addended by: Ovid Curd R on: 09/26/2017 08:05 AM   Modules accepted: Level of Service

## 2017-09-26 NOTE — Progress Notes (Signed)
Subjective: [name] presents to the office today for follow-up evaluation of left heel pain. They state that they are doing much better and he states "I have no pain, anywhere". They have been icing, stretching, try to wear supportive shoe as much as possible. Is also been went plantar fascial brace. He states he's been on his feet quite a bit he's had no pain. No other complaints at this time. No acute changes since last appointment. They deny any systemic complaints such as fevers, chills, nausea, vomiting.  Objective: General: AAO x3, NAD  Dermatological: Skin is warm, dry and supple bilateral. Nails x 10 are well manicured; remaining integument appears unremarkable at this time. There are no open sores, no preulcerative lesions, no rash or signs of infection present.  Vascular: Dorsalis Pedis artery and Posterior Tibial artery pedal pulses are 2/4 bilateral with immedate capillary fill time. Pedal hair growth present. There is no pain with calf compression, swelling, warmth, erythema.   Neruologic: Grossly intact via light touch bilateral. Vibratory intact via tuning fork bilateral. Protective threshold with Semmes Wienstein monofilament intact to all pedal sites bilateral.   Musculoskeletal: There is no tenderness to palpation along the plantar medial tubercle of the calcaneus at the insertion of the plantar fascia on the  left foot. There is no pain along the course of the plantar fascia within the arch of the foot. Plantar fascia appears to be intact bilaterally. There is no pain with lateral compression of the calcaneus and there is no pain with vibratory sensation. There is no pain along the course or insertion of the Achilles tendon. There are no other areas of tenderness to bilateral lower extremities. No gross boney pedal deformities bilateral. No pain, crepitus, or limitation noted with foot and ankle range of motion bilateral. Muscular strength 5/5 in all groups tested  bilateral.  Gait: Unassisted, Nonantalgic.   Assessment: Presents for follow-up evaluation for heel pain, likely plantar fasciitis   Plan: -Treatment options discussed including all alternatives, risks, and complications -This time is having no pain. Hold off any further steroid injections or medication for this. We discussed measures to help prevent recurrence. Continue stretching, icing as well as plantar fascial brace. Discussed inserts and shoe changes well. -Follow-up as needed. In the meantime, encouraged to call the office with any questions, concerns, change in symptoms.   Ovid Curd, DPM

## 2017-10-19 ENCOUNTER — Other Ambulatory Visit (HOSPITAL_COMMUNITY)
Admission: RE | Admit: 2017-10-19 | Discharge: 2017-10-19 | Disposition: A | Discharge status: Home / Self Care | Payer: Managed Care, Other (non HMO) | Source: Other Acute Inpatient Hospital | Attending: Urology | Admitting: Urology

## 2017-10-19 ENCOUNTER — Ambulatory Visit (INDEPENDENT_AMBULATORY_CARE_PROVIDER_SITE_OTHER): Payer: Managed Care, Other (non HMO) | Admitting: Urology

## 2017-10-19 DIAGNOSIS — N401 Enlarged prostate with lower urinary tract symptoms: Secondary | ICD-10-CM

## 2017-10-19 DIAGNOSIS — N3001 Acute cystitis with hematuria: Secondary | ICD-10-CM | POA: Diagnosis not present

## 2017-10-19 DIAGNOSIS — N390 Urinary tract infection, site not specified: Secondary | ICD-10-CM | POA: Diagnosis present

## 2017-10-22 LAB — URINE CULTURE: Culture: >=100000 — AB

## 2017-11-14 ENCOUNTER — Ambulatory Visit (INDEPENDENT_AMBULATORY_CARE_PROVIDER_SITE_OTHER): Payer: Managed Care, Other (non HMO) | Admitting: Urology

## 2017-11-14 DIAGNOSIS — N401 Enlarged prostate with lower urinary tract symptoms: Secondary | ICD-10-CM

## 2017-11-14 DIAGNOSIS — N3001 Acute cystitis with hematuria: Secondary | ICD-10-CM

## 2018-02-06 ENCOUNTER — Other Ambulatory Visit: Payer: Self-pay | Admitting: *Deleted

## 2018-02-06 ENCOUNTER — Encounter: Payer: Self-pay | Admitting: Gastroenterology

## 2018-02-06 ENCOUNTER — Ambulatory Visit (INDEPENDENT_AMBULATORY_CARE_PROVIDER_SITE_OTHER): Payer: Managed Care, Other (non HMO) | Admitting: Gastroenterology

## 2018-02-06 ENCOUNTER — Encounter: Payer: Self-pay | Admitting: *Deleted

## 2018-02-06 DIAGNOSIS — R1310 Dysphagia, unspecified: Secondary | ICD-10-CM

## 2018-02-06 DIAGNOSIS — K222 Esophageal obstruction: Secondary | ICD-10-CM

## 2018-02-06 DIAGNOSIS — B3781 Candidal esophagitis: Secondary | ICD-10-CM

## 2018-02-06 DIAGNOSIS — K5732 Diverticulitis of large intestine without perforation or abscess without bleeding: Secondary | ICD-10-CM

## 2018-02-06 NOTE — Progress Notes (Signed)
Subjective:    Patient ID: [name], male    DOB: Mar 25, 1951, 67 y.o.   MRN: [id number]  Elfredia Nevins, MD  HPI HAVING TROUBLE SWALLOWING: SOME. IT DEPENDS ON WHAT HE EATS. OCCASIONAL PIECE OF CHICKEN GETS HUNG. LAST THING. FEEL LIKE SOLIDS GETS STUCK IN ESOPHAGUS. BEEN NOTICING TROUBLE MORE SINCE LAST 6 MOS. HAPPENS ONCE A MONTH. LAST EGD/DIL 2014 AND WAS DOING FINE UNTIL 6 MOS AGO.   PT DENIES FEVER, CHILLS, HEMATOCHEZIA, HEMATEMESIS, nausea, vomiting, melena, diarrhea, CHEST PAIN, SHORTNESS OF BREATH,  CHANGE IN BOWEL IN HABITS, constipation, abdominal pain, problems with sedation, OR heartburn or indigestion.  Past Medical History:  Diagnosis Date  . Candida esophagitis (HCC) OCT 2014  . Deafness in right ear   . Esophageal stricture SEP 2011   FOOD IMPACTION-->SAV DIL 16 MM  . GERD (gastroesophageal reflux disease)   . Hypertension   . Kidney stone   . Myasthenia gravis (HCC)   . Thyroid disease    Past Surgical History:  Procedure Laterality Date  . CHOLECYSTECTOMY    . COLONOSCOPY  2005   Dr. Lovell Sheehan  . COLONOSCOPY N/A 07/17/2014   Procedure: COLONOSCOPY;  Surgeon: West Bali, MD;  Location: [facility];  Service: Endoscopy;  Laterality: N/A;  9:30-rescheduled 8/28 same time Soledad Gerlach to notify pt  . ESOPHAGOGASTRODUODENOSCOPY N/A 07/25/2013   Shallow ulcer in the cardia, mid esophageal web, stricture at GE junction  . ESOPHAGOGASTRODUODENOSCOPY (EGD) WITH ESOPHAGEAL DILATION N/A 08/29/2013   Candida esophagitis, small hiatal hernia, moderate nonerosive gastritis, mid esophageal web  . HERNIA REPAIR     right inguinal  . KIDNEY STONE SURGERY    . NASAL SEPTUM SURGERY    . PALATE SURGERY        . THYMECTOMY    . TONSILLECTOMY    . UPPER GASTROINTESTINAL ENDOSCOPY  SEP 2011   SAV DIL    Allergies  Allergen Reactions  . Levofloxacin Anaphylaxis  . Tequin [Gatifloxacin] Anaphylaxis  . Aminoglycosides Other (See Comments)    REACTION: Myasthenia  Gravis (MG) Medication Alert  . Avelox [Moxifloxacin Hcl In Nacl] Other (See Comments)    REACTION: FLUOROQUINOLONE ANTIBIOTICS: Myasthenia Gravis (MG) Medication Alert  . Botox [Onabotulinumtoxina] Other (See Comments)    REACTION: Myasthenia Gravis (MG) Medication Alert  . Botulinum Toxins Other (See Comments)    REACTION: Myasthenia Gravis (MG) Medication Alert  . Calcium Channel Blockers Other (See Comments)    (BLOOD PRESSURE MEDICATION) REACTION: Myasthenia Gravis (MG) Medication Alert  . Ciprofloxacin Other (See Comments)    REACTION: FLUOROQUINOLONE ANTIBIOTICS: Myasthenia Gravis (MG) Medication Alert  . Curare [Tubocurarine] Other (See Comments)    (Usually only used during surgery)  REACTION: Myasthenia Gravis (MG) Medication Alert  . Dysport [Abobotulinumtoxina] Other (See Comments)    REACTION: Myasthenia Gravis (MG) Medication Alert  . Erythromycin Other (See Comments)    REACTION: Myasthenia Gravis (MG) Medication Alert  . Factive [Gemifloxacin] Other (See Comments)    REACTION: FLUOROQUINOLONE ANTIBIOTICS: Myasthenia Gravis (MG) Medication Alert  . Floxin [Ofloxacin] Other (See Comments)    REACTION: FLUOROQUINOLONE ANTIBIOTICS: Myasthenia Gravis (MG) Medication Alert  . Gentamycin [Gentamicin] Other (See Comments)    REACTION:Myasthenia Gravis (MG) Medication Alert  . Interferons Other (See Comments)    REACTION: Myasthenia Gravis (MG) Medication Alert  . Kanamycin Other (See Comments)    REACTION: Myasthenia Gravis (MG) Medication Alert  . Ketek [Telithromycin] Other (See Comments)    REACTION: Myasthenia Gravis (MG) Medication Alert  .  Levaquin [Levofloxacin In D5w] Other (See Comments)    REACTION: FLUOROQUINOLONE ANTIBIOTICS: Myasthenia Gravis (MG) Medication Alert  . Macrolides And Ketolides Other (See Comments)    REACTION: Myasthenia Gravis (MG) Medication Alert  . Magnesium-Containing Compounds   . Myobloc [Rimabotulinumtoxinb] Other (See Comments)     REACTION: Myasthenia Gravis (MG) Medication Alert  . Neomycin Other (See Comments)    REACTION: Myasthenia Gravis (MG) Medication Alert  . Noroxin [Norfloxacin] Other (See Comments)    REACTION: FLUOROQUINOLONE ANTIBIOTICS: Myasthenia Gravis (MG) Medication Alert  . Other Other (See Comments)    MAGNESIUM SALTS: Milk of Magnesia, some antacids (Maalox, Mylanta) REACTION: Myasthenia Gravis (MG) Medication Alerta  . Penicillamine Other (See Comments)    D-PENICILLAMINE *DO NOT CONFUSE WITH PENICILLIN*  REACTION: Myasthenia Gravis (MG) Medication Alert  . Procainamide Other (See Comments)    REACTION: Myasthenia Gravis (MG) Medication Alert  . Propranolol Other (See Comments)    REACTION: Myasthenia Gravis (MG) Medication Alert  . Quinidine Other (See Comments)    REACTION: Myasthenia Gravis (MG) Medication Alert  . Quinine Derivatives Other (See Comments)    REACTION: Myasthenia Gravis (MG) Medication Alert  . Streptomycin Other (See Comments)    REACTION:  Myasthenia Gravis (MG) Medication Alert  . Timolol Other (See Comments)    REACTION: Myasthenia Gravis (MG) Medication Alert  . Tobramycin     REACTION: Myasthenia Gravis (MG) Medication Alert  . Xeomin [Incobotulinumtoxina] Other (See Comments)    REACTION: Myasthenia Gravis (MG) Medication Alert  . Zithromax [Azithromycin] Other (See Comments)    REACTION: Myasthenia Gravis (MG) Medication Alert    Current Outpatient Medications  Medication Sig    . acyclovir (ZOVIRAX) 400 MG tablet Take 400 mg by mouth every morning.     [name] azaTHIOprine (IMURAN) 50 MG tablet Take 150 mg by mouth every morning.     . Calcium Carbonate-Vitamin D (CALCIUM 600+D) 600-200 MG-UNIT TABS Take 2 tablets by mouth every morning.     [name] losartan (COZAAR) 100 MG tablet Take 100 mg by mouth daily.     . metFORMIN (GLUCOPHAGE-XR) 500 MG 24 hr tablet Take 500 mg by mouth 2 (two) times daily.     . Omega-3 Fatty Acids (FISH OIL) 1200 MG CAPS Take 2 capsules by  mouth every morning.     [name] omeprazole (PRILOSEC) 20 MG capsule TAKE 1 CAPSULE BY MOUTH TWICE DAILY    . SYNTHROID 137 MCG tablet Take 137 mcg by mouth daily before breakfast.     . tamsulosin (FLOMAX) 0.4 MG CAPS capsule Take 0.4 mg by mouth daily.     . vitamin C (ASCORBIC ACID) 500 MG tablet Take 1,000 mg by mouth every morning.     . vitamin E 400 UNIT capsule Take 800 Units by mouth every morning.     [name] ipratropium (ATROVENT) 0.06 % nasal spray Place 2 sprays into both nostrils 2 (two) times daily.    . valsartan (DIOVAN) 80 MG tablet Take 80 mg by mouth daily.     Review of Systems PER HPI OTHERWISE ALL SYSTEMS ARE NEGATIVE.    Objective:   Physical Exam  Constitutional: He is oriented to person, place, and time. He appears well-developed and well-nourished. No distress.  HENT:  Head: Normocephalic and atraumatic.  Mouth/Throat: Oropharynx is clear and moist. No oropharyngeal exudate.  Eyes: Pupils are equal, round, and reactive to light. No scleral icterus.  Neck: Normal range of motion. Neck supple.  Cardiovascular: Normal rate, regular rhythm and normal  heart sounds.  Pulmonary/Chest: Effort normal and breath sounds normal. No respiratory distress.  Abdominal: Soft. Bowel sounds are normal. He exhibits no distension. There is no tenderness.  Musculoskeletal: He exhibits no edema.  Lymphadenopathy:    He has no cervical adenopathy.  Neurological: He is alert and oriented to person, place, and time.  NO NEW FOCAL DEFICITS  Psychiatric: He has a normal mood and affect.  Vitals reviewed.     Assessment & Plan:

## 2018-02-06 NOTE — Assessment & Plan Note (Signed)
SYMPTOMS CONTROLLED/RESOLVED.  CONTINUE TO MONITOR SYMPTOMS. 

## 2018-02-06 NOTE — Assessment & Plan Note (Signed)
SYMPTOMS NOT IDEALLY CONTROLLED AND LIKELY DUE TO RECURRENCE. NO WARNING SIGNS/SYMPTOMS.  UNTIL YOUR ENDOSCOPY IS COMPLETE, FOLLOW A SOFT MECHANICAL DIET.  HANDOUT GIVEN.[name]  MEATS SHOULD BE GROUND ONLY. DO NOT EAT CHUNKS OF ANYTHING. CONTINUE OMEPRAZOLE.  TAKE 30 MINUTES PRIOR TO YOUR FIRST MEAL. COMPLETE UPPER ENDOSCOPY TO STRETCH YOUR ESOPHAGUS IN 2 TO 3 WEEKS.DISCUSSED PROCEDURE, BENEFITS, & RISKS: < 1% chance of medication reaction, bleeding, OR perforation. ZOFRAN 4 MG IV IN PREOP.  FOLLOW UP IN 6 MOS.

## 2018-02-06 NOTE — Assessment & Plan Note (Signed)
SYMPTOMS CONTROLLED/RESOLVED.  EGD TO REASSESS. CONTINUE TO MONITOR SYMPTOMS.

## 2018-02-06 NOTE — H&P (View-Only) (Signed)
 Subjective:    Patient ID: [name], male    DOB: 09/15/1951, 67 y.o.   MRN: [id number]  Fusco, Lawrence, MD  HPI HAVING TROUBLE SWALLOWING: SOME. IT DEPENDS ON WHAT HE EATS. OCCASIONAL PIECE OF CHICKEN GETS HUNG. LAST THING. FEEL LIKE SOLIDS GETS STUCK IN ESOPHAGUS. BEEN NOTICING TROUBLE MORE SINCE LAST 6 MOS. HAPPENS ONCE A MONTH. LAST EGD/DIL 2014 AND WAS DOING FINE UNTIL 6 MOS AGO.   PT DENIES FEVER, CHILLS, HEMATOCHEZIA, HEMATEMESIS, nausea, vomiting, melena, diarrhea, CHEST PAIN, SHORTNESS OF BREATH,  CHANGE IN BOWEL IN HABITS, constipation, abdominal pain, problems with sedation, OR heartburn or indigestion.  Past Medical History:  Diagnosis Date  . Candida esophagitis (HCC) OCT 2014  . Deafness in right ear   . Esophageal stricture SEP 2011   FOOD IMPACTION-->SAV DIL 16 MM  . GERD (gastroesophageal reflux disease)   . Hypertension   . Kidney stone   . Myasthenia gravis (HCC)   . Thyroid disease    Past Surgical History:  Procedure Laterality Date  . CHOLECYSTECTOMY    . COLONOSCOPY  2005   Dr. Jenkins  . COLONOSCOPY N/A 07/17/2014   Procedure: COLONOSCOPY;  Surgeon: Sandi L Fields, MD;  Location: [facility];  Service: Endoscopy;  Laterality: N/A;  9:30-rescheduled 8/28 same time Leigh Ann to notify pt  . ESOPHAGOGASTRODUODENOSCOPY N/A 07/25/2013   Shallow ulcer in the cardia, mid esophageal web, stricture at GE junction  . ESOPHAGOGASTRODUODENOSCOPY (EGD) WITH ESOPHAGEAL DILATION N/A 08/29/2013   Candida esophagitis, small hiatal hernia, moderate nonerosive gastritis, mid esophageal web  . HERNIA REPAIR     right inguinal  . KIDNEY STONE SURGERY    . NASAL SEPTUM SURGERY    . PALATE SURGERY        . THYMECTOMY    . TONSILLECTOMY    . UPPER GASTROINTESTINAL ENDOSCOPY  SEP 2011   SAV DIL    Allergies  Allergen Reactions  . Levofloxacin Anaphylaxis  . Tequin [Gatifloxacin] Anaphylaxis  . Aminoglycosides Other (See Comments)    REACTION: Myasthenia  Gravis (MG) Medication Alert  . Avelox [Moxifloxacin Hcl In Nacl] Other (See Comments)    REACTION: FLUOROQUINOLONE ANTIBIOTICS: Myasthenia Gravis (MG) Medication Alert  . Botox [Onabotulinumtoxina] Other (See Comments)    REACTION: Myasthenia Gravis (MG) Medication Alert  . Botulinum Toxins Other (See Comments)    REACTION: Myasthenia Gravis (MG) Medication Alert  . Calcium Channel Blockers Other (See Comments)    (BLOOD PRESSURE MEDICATION) REACTION: Myasthenia Gravis (MG) Medication Alert  . Ciprofloxacin Other (See Comments)    REACTION: FLUOROQUINOLONE ANTIBIOTICS: Myasthenia Gravis (MG) Medication Alert  . Curare [Tubocurarine] Other (See Comments)    (Usually only used during surgery)  REACTION: Myasthenia Gravis (MG) Medication Alert  . Dysport [Abobotulinumtoxina] Other (See Comments)    REACTION: Myasthenia Gravis (MG) Medication Alert  . Erythromycin Other (See Comments)    REACTION: Myasthenia Gravis (MG) Medication Alert  . Factive [Gemifloxacin] Other (See Comments)    REACTION: FLUOROQUINOLONE ANTIBIOTICS: Myasthenia Gravis (MG) Medication Alert  . Floxin [Ofloxacin] Other (See Comments)    REACTION: FLUOROQUINOLONE ANTIBIOTICS: Myasthenia Gravis (MG) Medication Alert  . Gentamycin [Gentamicin] Other (See Comments)    REACTION:Myasthenia Gravis (MG) Medication Alert  . Interferons Other (See Comments)    REACTION: Myasthenia Gravis (MG) Medication Alert  . Kanamycin Other (See Comments)    REACTION: Myasthenia Gravis (MG) Medication Alert  . Ketek [Telithromycin] Other (See Comments)    REACTION: Myasthenia Gravis (MG) Medication Alert  .   Levaquin [Levofloxacin In D5w] Other (See Comments)    REACTION: FLUOROQUINOLONE ANTIBIOTICS: Myasthenia Gravis (MG) Medication Alert  . Macrolides And Ketolides Other (See Comments)    REACTION: Myasthenia Gravis (MG) Medication Alert  . Magnesium-Containing Compounds   . Myobloc [Rimabotulinumtoxinb] Other (See Comments)     REACTION: Myasthenia Gravis (MG) Medication Alert  . Neomycin Other (See Comments)    REACTION: Myasthenia Gravis (MG) Medication Alert  . Noroxin [Norfloxacin] Other (See Comments)    REACTION: FLUOROQUINOLONE ANTIBIOTICS: Myasthenia Gravis (MG) Medication Alert  . Other Other (See Comments)    MAGNESIUM SALTS: Milk of Magnesia, some antacids (Maalox, Mylanta) REACTION: Myasthenia Gravis (MG) Medication Alerta  . Penicillamine Other (See Comments)    D-PENICILLAMINE *DO NOT CONFUSE WITH PENICILLIN*  REACTION: Myasthenia Gravis (MG) Medication Alert  . Procainamide Other (See Comments)    REACTION: Myasthenia Gravis (MG) Medication Alert  . Propranolol Other (See Comments)    REACTION: Myasthenia Gravis (MG) Medication Alert  . Quinidine Other (See Comments)    REACTION: Myasthenia Gravis (MG) Medication Alert  . Quinine Derivatives Other (See Comments)    REACTION: Myasthenia Gravis (MG) Medication Alert  . Streptomycin Other (See Comments)    REACTION:  Myasthenia Gravis (MG) Medication Alert  . Timolol Other (See Comments)    REACTION: Myasthenia Gravis (MG) Medication Alert  . Tobramycin     REACTION: Myasthenia Gravis (MG) Medication Alert  . Xeomin [Incobotulinumtoxina] Other (See Comments)    REACTION: Myasthenia Gravis (MG) Medication Alert  . Zithromax [Azithromycin] Other (See Comments)    REACTION: Myasthenia Gravis (MG) Medication Alert    Current Outpatient Medications  Medication Sig    . acyclovir (ZOVIRAX) 400 MG tablet Take 400 mg by mouth every morning.     . azaTHIOprine (IMURAN) 50 MG tablet Take 150 mg by mouth every morning.     . Calcium Carbonate-Vitamin D (CALCIUM 600+D) 600-200 MG-UNIT TABS Take 2 tablets by mouth every morning.     . losartan (COZAAR) 100 MG tablet Take 100 mg by mouth daily.     . metFORMIN (GLUCOPHAGE-XR) 500 MG 24 hr tablet Take 500 mg by mouth 2 (two) times daily.     . Omega-3 Fatty Acids (FISH OIL) 1200 MG CAPS Take 2 capsules by  mouth every morning.     . omeprazole (PRILOSEC) 20 MG capsule TAKE 1 CAPSULE BY MOUTH TWICE DAILY    . SYNTHROID 137 MCG tablet Take 137 mcg by mouth daily before breakfast.     . tamsulosin (FLOMAX) 0.4 MG CAPS capsule Take 0.4 mg by mouth daily.     . vitamin C (ASCORBIC ACID) 500 MG tablet Take 1,000 mg by mouth every morning.     . vitamin E 400 UNIT capsule Take 800 Units by mouth every morning.     . ipratropium (ATROVENT) 0.06 % nasal spray Place 2 sprays into both nostrils 2 (two) times daily.    . valsartan (DIOVAN) 80 MG tablet Take 80 mg by mouth daily.     Review of Systems PER HPI OTHERWISE ALL SYSTEMS ARE NEGATIVE.    Objective:   Physical Exam  Constitutional: He is oriented to person, place, and time. He appears well-developed and well-nourished. No distress.  HENT:  Head: Normocephalic and atraumatic.  Mouth/Throat: Oropharynx is clear and moist. No oropharyngeal exudate.  Eyes: Pupils are equal, round, and reactive to light. No scleral icterus.  Neck: Normal range of motion. Neck supple.  Cardiovascular: Normal rate, regular rhythm and normal   heart sounds.  Pulmonary/Chest: Effort normal and breath sounds normal. No respiratory distress.  Abdominal: Soft. Bowel sounds are normal. He exhibits no distension. There is no tenderness.  Musculoskeletal: He exhibits no edema.  Lymphadenopathy:    He has no cervical adenopathy.  Neurological: He is alert and oriented to person, place, and time.  NO NEW FOCAL DEFICITS  Psychiatric: He has a normal mood and affect.  Vitals reviewed.     Assessment & Plan:   

## 2018-02-06 NOTE — Patient Instructions (Addendum)
UNTIL YOUR ENDOSCOPY IS COMPLETE, FOLLOW A SOFT MECHANICAL DIET. SEE INFO BELOW.  MEATS SHOULD BE GROUND ONLY. DO NOT EAT CHUNKS OF ANYTHING.  CONTINUE OMEPRAZOLE.  TAKE 30 MINUTES PRIOR TO YOUR FIRST MEAL.   COMPLETE UPPER ENDOSCOPY TO STRETCH YOUR ESOPHAGUS IN 2 TO 3 WEEKS.  FOLLOW UP IN 6 MOS.   SOFT MECHANICAL DIET This SOFT MECHANICAL DIET is restricted to:  Foods that are moist, soft-textured, and easy to chew and swallow.   Meats that are ground or are minced no larger than one-quarter inch pieces. Meats are moist with gravy or sauce added.   Foods that do not include bread or bread-like textures except soft pancakes, well-moistened with syrup or sauce.   Textures with some chewing ability required.   Casseroles without rice.   Cooked vegetables that are less than half an inch in size and easily mashed with a fork. No cooked corn, peas, broccoli, cauliflower, cabbage, Brussels sprouts, asparagus, or other fibrous, non-tender or rubbery cooked vegetables.   Canned fruit except for pineapple. Fruit must be cut into pieces no larger than half an inch in size.   Foods that do not include nuts, seeds, coconut, or sticky textures.   FOOD TEXTURES FOR DYSPHAGIA DIET LEVEL 2 -SOFT MECHANICAL DIET (includes all foods on Dysphagia Diet Level 1 - Pureed, in addition to the foods listed below)  FOOD GROUP: Breads. RECOMMENDED: Soft pancakes, well-moistened with syrup or sauce.  AVOID: All others.  FOOD GROUP: Cereals.  RECOMMENDED: Cooked cereals with little texture, including oatmeal. Unprocessed wheat bran stirred into cereals for bulk. Note: If thin liquids are restricted, it is important that all of the liquid is absorbed into the cereal.  AVOID: All dry cereals and any cooked cereals that may contain flax seeds or other seeds or nuts. Whole-grain, dry, or coarse cereals. Cereals with nuts, seeds, dried fruit, and/or coconut.  FOOD GROUP: Desserts. RECOMMENDED: Pudding,  custard. Soft fruit pies with bottom crust only. Canned fruit (excluding pineapple). Soft, moist cakes with icing.Frozen malts, milk shakes, frozen yogurt, eggnog, nutritional supplements, ice cream, sherbet, regular or sugar-free gelatin, or any foods that become thin liquid at either room (70 F) or body temperature (98 F).  AVOID: Dry, coarse cakes and cookies. Anything with nuts, seeds, coconut, pineapple, or dried fruit. Breakfast yogurt with nuts. Rice or bread pudding.  FOOD GROUP: Fats. RECOMMENDED: Butter, margarine, cream for cereal (depending on liquid consistency recommendations), gravy, cream sauces, sour cream, sour cream dips with soft additives, mayonnaise, salad dressings, cream cheese, cream cheese spreads with soft additives, whipped toppings.  AVOID: All fats with coarse or chunky additives.  FOOD GROUP: Fruits. RECOMMENDED: Soft drained, canned, or cooked fruits without seeds or skin. Fresh soft and ripe banana. Fruit juices with a small amount of pulp. If thin liquids are restricted, fruit juices should be thickened to appropriate consistency.  AVOID: Fresh or frozen fruits. Cooked fruit with skin or seeds. Dried fruits. Fresh, canned, or cooked pineapple.  FOOD GROUP: Meats and Meat Substitutes. (Meat pieces should not exceed 1/4 of an inch cube and should be tender.) RECOMMENDED: Moistened ground or cooked meat, poultry, or fish. Moist ground or tender meat may be served with gravy or sauce. Casseroles without rice. Moist macaroni and cheese, well-cooked pasta with meat sauce, tuna noodle casserole, soft, moist lasagna. Moist meatballs, meatloaf, or fish loaf. Protein salads, such as tuna or egg without large chunks, celery, or onion. Cottage cheese, smooth quiche without large chunks. Poached, scrambled,  or soft-cooked eggs (egg yolks should not be "runny" but should be moist and able to be mashed with butter, margarine, or other moisture added to them). (Cook eggs to 160  F or use pasteurized eggs for safety.) Souffls may have small, soft chunks. Tofu. Well-cooked, slightly mashed, moist legumes, such as baked beans. All meats or protein substitutes should be served with sauces or moistened to help maintain cohesiveness in the oral cavity.  AVOID: Dry meats, tough meats (such as bacon, sausage, hot dogs, bratwurst). Dry casseroles or casseroles with rice or large chunks. Peanut butter. Cheese slices and cubes. Hard-cooked or crisp fried eggs. Sandwiches.Pizza.  FOOD GROUP: Potatoes and Starches. RECOMMENDED: Well-cooked, moistened, boiled, baked, or mashed potatoes. Well-cooked shredded hash brown potatoes that are not crisp. (All potatoes need to be moist and in sauces.)Well-cooked noodles in sauce. Spaetzel or soft dumplings that have been moistened with butter or gravy.  AVOID: Potato skins and chips. Fried or French-fried potatoes. Rice.  FOOD GROUP: Soups. RECOMMENDED: Soups with easy-to-chew or easy-to-swallow meats or vegetables: Particle sizes in soups should be less than 1/2 inch. Soups will need to be thickened to appropriate consistency if soup is thinner than prescribed liquid consistency.  AVOID: Soups with large chunks of meat and vegetables. Soups with rice, corn, peas.  FOOD GROUP: Vegetables. RECOMMENDED: All soft, well-cooked vegetables. Vegetables should be less than a half inch. Should be easily mashed with a fork.  AVOID: Cooked corn and peas. Broccoli, cabbage, Brussels sprouts, asparagus, or other fibrous, non-tender or rubbery cooked vegetables.  FOOD GROUP: Miscellaneous. RECOMMENDED: Jams and preserves without seeds, jelly. Sauces, salsas, etc., that may have small tender chunks less than 1/2 inch. Soft, smooth chocolate bars that are easily chewed.  AVOID: Seeds, nuts, coconut, or sticky foods. Chewy candies such as caramels or licorice.

## 2018-02-07 NOTE — Progress Notes (Signed)
cc'ed to pcp °

## 2018-02-07 NOTE — Progress Notes (Signed)
ON RECALL  °

## 2018-02-14 ENCOUNTER — Encounter (HOSPITAL_COMMUNITY): Payer: Self-pay | Admitting: *Deleted

## 2018-02-14 ENCOUNTER — Encounter (HOSPITAL_COMMUNITY)
Admission: RE | Disposition: A | Discharge status: Home / Self Care | Payer: Self-pay | Source: Ambulatory Visit | Attending: Gastroenterology

## 2018-02-14 ENCOUNTER — Ambulatory Visit (HOSPITAL_COMMUNITY)
Admission: RE | Admit: 2018-02-14 | Discharge: 2018-02-14 | Disposition: A | Discharge status: Home / Self Care | Payer: Managed Care, Other (non HMO) | Source: Ambulatory Visit | Attending: Gastroenterology | Admitting: Gastroenterology

## 2018-02-14 ENCOUNTER — Other Ambulatory Visit: Payer: Self-pay

## 2018-02-14 DIAGNOSIS — K219 Gastro-esophageal reflux disease without esophagitis: Secondary | ICD-10-CM | POA: Diagnosis not present

## 2018-02-14 DIAGNOSIS — Z880 Allergy status to penicillin: Secondary | ICD-10-CM | POA: Diagnosis not present

## 2018-02-14 DIAGNOSIS — I10 Essential (primary) hypertension: Secondary | ICD-10-CM | POA: Diagnosis not present

## 2018-02-14 DIAGNOSIS — K2950 Unspecified chronic gastritis without bleeding: Secondary | ICD-10-CM | POA: Diagnosis not present

## 2018-02-14 DIAGNOSIS — Z881 Allergy status to other antibiotic agents status: Secondary | ICD-10-CM | POA: Insufficient documentation

## 2018-02-14 DIAGNOSIS — K449 Diaphragmatic hernia without obstruction or gangrene: Secondary | ICD-10-CM | POA: Diagnosis not present

## 2018-02-14 DIAGNOSIS — G7000 Myasthenia gravis without (acute) exacerbation: Secondary | ICD-10-CM | POA: Diagnosis not present

## 2018-02-14 DIAGNOSIS — K259 Gastric ulcer, unspecified as acute or chronic, without hemorrhage or perforation: Secondary | ICD-10-CM | POA: Diagnosis not present

## 2018-02-14 DIAGNOSIS — K317 Polyp of stomach and duodenum: Secondary | ICD-10-CM | POA: Insufficient documentation

## 2018-02-14 DIAGNOSIS — Z888 Allergy status to other drugs, medicaments and biological substances status: Secondary | ICD-10-CM | POA: Diagnosis not present

## 2018-02-14 DIAGNOSIS — Z7984 Long term (current) use of oral hypoglycemic drugs: Secondary | ICD-10-CM | POA: Insufficient documentation

## 2018-02-14 DIAGNOSIS — K222 Esophageal obstruction: Secondary | ICD-10-CM | POA: Diagnosis not present

## 2018-02-14 DIAGNOSIS — R1310 Dysphagia, unspecified: Secondary | ICD-10-CM

## 2018-02-14 DIAGNOSIS — Z79899 Other long term (current) drug therapy: Secondary | ICD-10-CM | POA: Insufficient documentation

## 2018-02-14 HISTORY — PX: ESOPHAGOGASTRODUODENOSCOPY: SHX5428

## 2018-02-14 HISTORY — DX: Type 2 diabetes mellitus without complications: E11.9

## 2018-02-14 HISTORY — PX: SAVORY DILATION: SHX5439

## 2018-02-14 HISTORY — PX: BIOPSY: SHX5522

## 2018-02-14 LAB — KOH PREP

## 2018-02-14 LAB — GLUCOSE, CAPILLARY: Glucose-Capillary: 138 mg/dL — ABNORMAL HIGH (ref 65–99)

## 2018-02-14 SURGERY — EGD (ESOPHAGOGASTRODUODENOSCOPY)
Anesthesia: Moderate Sedation

## 2018-02-14 MED ORDER — MIDAZOLAM HCL 5 MG/5ML IJ SOLN
INTRAMUSCULAR | Status: AC
  Filled 2018-02-14: qty 10

## 2018-02-14 MED ORDER — MEPERIDINE HCL 100 MG/ML IJ SOLN
INTRAMUSCULAR | Status: AC
  Filled 2018-02-14: qty 2

## 2018-02-14 MED ORDER — LIDOCAINE VISCOUS 2 % MT SOLN
OROMUCOSAL | Status: AC
  Filled 2018-02-14: qty 15

## 2018-02-14 MED ORDER — MIDAZOLAM HCL 5 MG/5ML IJ SOLN
INTRAMUSCULAR | Status: DC | PRN
  Administered 2018-02-14 (×2): 2 mg via INTRAVENOUS

## 2018-02-14 MED ORDER — LIDOCAINE VISCOUS 2 % MT SOLN
OROMUCOSAL | Status: DC | PRN
  Administered 2018-02-14: 4 mL via OROMUCOSAL

## 2018-02-14 MED ORDER — MINERAL OIL PO OIL
TOPICAL_OIL | ORAL | Status: AC
  Filled 2018-02-14: qty 30

## 2018-02-14 MED ORDER — STERILE WATER FOR IRRIGATION IR SOLN
Status: DC | PRN
  Administered 2018-02-14: 11:00:00

## 2018-02-14 MED ORDER — ONDANSETRON HCL 4 MG/2ML IJ SOLN
INTRAMUSCULAR | Status: AC
  Filled 2018-02-14: qty 2

## 2018-02-14 MED ORDER — SODIUM CHLORIDE 0.9 % IV SOLN
INTRAVENOUS | Status: DC
  Administered 2018-02-14: 11:00:00 via INTRAVENOUS

## 2018-02-14 MED ORDER — ONDANSETRON HCL 4 MG/2ML IJ SOLN
4.0000 mg | Freq: Once | INTRAMUSCULAR | Status: AC
  Administered 2018-02-14: 4 mg via INTRAVENOUS

## 2018-02-14 MED ORDER — MEPERIDINE HCL 100 MG/ML IJ SOLN
INTRAMUSCULAR | Status: DC | PRN
  Administered 2018-02-14: 25 mg via INTRAVENOUS
  Administered 2018-02-14: 50 mg via INTRAVENOUS

## 2018-02-14 NOTE — Op Note (Signed)
[facility] Patient Name: [name] Procedure Date: 02/14/2018 11:00 AM MRN: [id number] Date of Birth: August 27, 1951 Attending MD: Jonette Eva MD, MD CSN: [id number] Age: 67 Admit Type: Outpatient Procedure:                Upper GI endoscopy WITH ESOPHAGEAL DILATION/COLD                            FORCEPS BIOPSY Indications:              Dysphagia Providers:                Jonette Eva MD, MD, Loma Messing B. Patsy Lager, RN Referring MD:             Elfredia Nevins, MD Medicines:                Meperidine 75 mg IV, Midazolam 4 mg IV Complications:            No immediate complications. Estimated Blood Loss:     Estimated blood loss was minimal. Procedure:                Pre-Anesthesia Assessment:                           - Prior to the procedure, a History and Physical                            was performed, and patient medications and                            allergies were reviewed. The patient's tolerance of                            previous anesthesia was also reviewed. The risks                            and benefits of the procedure and the sedation                            options and risks were discussed with the patient.                            All questions were answered, and informed consent                            was obtained. Prior Anticoagulants: The patient has                            taken no previous anticoagulant or antiplatelet                            agents. ASA Grade Assessment: II - A patient with                            mild systemic disease. After reviewing the risks  and benefits, the patient was deemed in                            satisfactory condition to undergo the procedure.                            After obtaining informed consent, the endoscope was                            passed under direct vision. Throughout the                            procedure, the patient's blood pressure, pulse, and                         oxygen saturations were monitored continuously. The                            EG-[id number] ([id number]) scope was introduced through the                            mouth, and advanced to the second part of duodenum.                            The upper GI endoscopy was accomplished without                            difficulty. The patient tolerated the procedure                            well. Scope In: 11:14:30 AM Scope Out: 11:26:36 AM Total Procedure Duration: 0 hours 12 minutes 6 seconds  Findings:      One moderate (circumferential scarring or stenosis; an endoscope may       pass) benign-appearing, intrinsic stenosis was found. This measured 1.3       cm (inner diameter) and was traversed. A guidewire was placed and the       scope was withdrawn. Dilation was performed with a Savary dilator with       mild resistance at 14 mm and 15 mm and moderate resistance at 16 mm and       17 mm. Estimated blood loss was minimal.      One non-bleeding cratered gastric ulcer with no stigmata of bleeding was       found in the cardia. This was biopsied with a cold forceps for histology.      A few small sessile polyps were found in the gastric body. The polyp was       removed with a cold biopsy forceps. Resection and retrieval were       complete.      A small hiatal hernia was present.      Localized mild inflammation characterized by congestion (edema) and       erythema was found in the gastric antrum.      The examined duodenum was normal. Impression:               - Benign-appearing esophageal stricture. Dilated.                           -  Non-bleeding Cameron's gastric ulcer with no                            stigmata of bleeding. Biopsied.                           - A few gastric polyps. Resected and retrieved.                           - Small hiatal hernia.                           - Mild Gastritis. Moderate Sedation:      Moderate (conscious) sedation was  administered by the endoscopy nurse       and supervised by the endoscopist. The following parameters were       monitored: oxygen saturation, heart rate, blood pressure, and response       to care. Total physician intraservice time was 23 minutes. Recommendation:           - Await pathology results.                           - Return to my office in 4 months.                           - High fiber diet and low fat diet.                           - Continue present medications.                           - Patient has a contact number available for                            emergencies. The signs and symptoms of potential                            delayed complications were discussed with the                            patient. Return to normal activities tomorrow.                            Written discharge instructions were provided to the                            patient. Procedure Code(s):        --- Professional ---                           [phone], Esophagogastroduodenoscopy, flexible,                            transoral; with insertion of guide wire followed by  passage of dilator(s) through esophagus over guide                            wire                           43239, Esophagogastroduodenoscopy, flexible,                            transoral; with biopsy, single or multiple                           99152, Moderate sedation services provided by the                            same physician or other qualified health care                            professional performing the diagnostic or                            therapeutic service that the sedation supports,                            requiring the presence of an independent trained                            observer to assist in the monitoring of the                            patient's level of consciousness and physiological                            status; initial 15 minutes of  intraservice time,                            patient age 31 years or older                           [phone], Moderate sedation services; each additional                            15 minutes intraservice time Diagnosis Code(s):        --- Professional ---                           K22.2, Esophageal obstruction                           K25.9, Gastric ulcer, unspecified as acute or                            chronic, without hemorrhage or perforation                           K31.7, Polyp of stomach and duodenum  K44.9, Diaphragmatic hernia without obstruction or                            gangrene                           K29.70, Gastritis, unspecified, without bleeding                           R13.10, Dysphagia, unspecified CPT copyright 2016 American Medical Association. All rights reserved. The codes documented in this report are preliminary and upon coder review may  be revised to meet current compliance requirements. Jonette Eva, MD Jonette Eva MD, MD 02/14/2018 11:45:11 AM This report has been signed electronically. Number of Addenda: 0

## 2018-02-14 NOTE — Discharge Instructions (Signed)
I STRETCHED your esophagus. You have a small hiatal hernia. You have gastritis & DUODENITIS. I biopsied your stomach.   DRINK WATER TO KEEP YOUR URINE LIGHT YELLOW.  FOLLOW A LOW FAT DIET.  MEATS SHOULD BE BAKED, BROILED, OR BOILED. AVOID FRIED FOODS.  CONTINUE OMEPRAZOLE.  TAKE 30 MINUTES PRIOR TO YOUR MEALS TWICE DAILY.  YOUR BIOPSY WILL BE BACK IN 7 DAYS  FOLLOW UP APPT IN 4 MONTHS.    UPPER ENDOSCOPY AFTER CARE Read the instructions outlined below and refer to this sheet in the next week. These discharge instructions provide you with general information on caring for yourself after you leave the hospital. While your treatment has been planned according to the most current medical practices available, unavoidable complications occasionally occur. If you have any problems or questions after discharge, call DR. Damien Cisar, [phone].  ACTIVITY  You may resume your regular activity, but move at a slower pace for the next 24 hours.   Take frequent rest periods for the next 24 hours.   Walking will help get rid of the air and reduce the bloated feeling in your belly (abdomen).   No driving for 24 hours (because of the medicine (anesthesia) used during the test).   You may shower.   Do not sign any important legal documents or operate any machinery for 24 hours (because of the anesthesia used during the test).    NUTRITION  Drink plenty of fluids.   You may resume your normal diet as instructed by your doctor.   Begin with a light meal and progress to your normal diet. Heavy or fried foods are harder to digest and may make you feel sick to your stomach (nauseated).   Avoid alcoholic beverages for 24 hours or as instructed.    MEDICATIONS  You may resume your normal medications.   WHAT YOU CAN EXPECT TODAY  Some feelings of bloating in the abdomen.   Passage of more gas than usual.    IF YOU HAD A BIOPSY TAKEN DURING THE UPPER ENDOSCOPY:  Eat a soft diet IF YOU  HAVE NAUSEA, BLOATING, ABDOMINAL PAIN, OR VOMITING.    FINDING OUT THE RESULTS OF YOUR TEST Not all test results are available during your visit. DR. Darrick Penna WILL CALL YOU WITHIN 14 DAYS OF YOUR PROCEDUE WITH YOUR RESULTS. Do not assume everything is normal if you have not heard from DR. Lareina Espino, CALL HER OFFICE AT [phone].  SEEK IMMEDIATE MEDICAL ATTENTION AND CALL THE OFFICE: [phone] IF:  You have more than a spotting of blood in your stool.   Your belly is swollen (abdominal distention).   You are nauseated or vomiting.   You have a temperature over 101F.   You have abdominal pain or discomfort that is severe or gets worse throughout the day.   Gastritis  Gastritis is an inflammation (the body's way of reacting to injury and/or infection) of the stomach. It can also be caused BY ASPIRIN, BC/GOODY POWDER'S, (IBUPROFEN) MOTRIN, OR ALEVE (NAPROXEN), chemicals (including alcohol), SPICY FOODS, and medications. This illness may be associated with generalized malaise (feeling tired, not well), UPPER ABDOMINAL STOMACH cramps, and fever. One common bacterial cause of gastritis is an organism known as H. Pylori. This can be treated with antibiotics.   Hiatal Hernia A hiatal hernia occurs when a part of the stomach slides above the diaphragm. The diaphragm is the thin muscle separating the belly (abdomen) from the chest. A hiatal hernia can be something you are born with or  develop over time. Hiatal hernias may allow stomach acid to flow back into your esophagus, the tube which carries food from your mouth to your stomach. If this acid causes problems it is called GERD (gastro-esophageal reflux disease).   SYMPTOMS Common symptoms of GERD are heartburn (burning in your chest). This is worse when lying down or bending over. It may also cause belching and indigestion. Some of the things which make GERD worse are:  Increased weight pushes on stomach making acid rise more easily.    Smoking markedly increases acid production.   Alcohol decreases lower esophageal sphincter pressure (valve between stomach and esophagus), allowing acid from stomach into esophagus.   Late evening meals and going to bed with a full stomach increases pressure.    ESOPHAGEAL STRICTURE  Esophageal strictures can be caused by stomach acid backing up into the tube that carries food from the mouth down to the stomach (lower esophagus).  TREATMENT There are a number of medicines used to treat reflux/stricture, including: Antacids.  ZANTAC OR PEPCID Proton-pump inhibitors: OMEPRAZOLE  HOME CARE INSTRUCTIONS Eat 2-3 hours before going to bed.  Try to reach and maintain a healthy weight.  Do not eat just a few very large meals. Instead, eat 4 TO 6 smaller meals throughout the day.  Try to identify foods and beverages that make your symptoms worse, and avoid these.  Avoid tight clothing.  Do not exercise right after eating.

## 2018-02-14 NOTE — Interval H&P Note (Signed)
History and Physical Interval Note:  02/14/2018 10:55 AM  [name]  has presented today for surgery, with the diagnosis of dysphagia  The various methods of treatment have been discussed with the patient and family. After consideration of risks, benefits and other options for treatment, the patient has consented to  Procedure(s) with comments: ESOPHAGOGASTRODUODENOSCOPY (EGD) (N/A) - 11:15am SAVORY DILATION (N/A) as a surgical intervention .  The patient's history has been reviewed, patient examined, no change in status, stable for surgery.  I have reviewed the patient's chart and labs.  Questions were answered to the patient's satisfaction.     [employer]

## 2018-02-17 ENCOUNTER — Telehealth: Payer: Self-pay | Admitting: Gastroenterology

## 2018-02-17 MED ORDER — FLUCONAZOLE 200 MG PO TABS: ORAL_TABLET | ORAL | 0 refills | Status: DC

## 2018-02-17 NOTE — Telephone Encounter (Signed)
Called patient TO DISCUSS RESULTS.DISCUSSED PREP RESULTS. NEEDS DIFLUCAN 400 MG TODAY THEN 200 MG PO DAILY FOR 14 DAYS.   WILL CONTACT WITH BIOPSY REPORT WHEN AVAILABLE.

## 2018-02-18 NOTE — Progress Notes (Signed)
LMOM to call.

## 2018-02-18 NOTE — Progress Notes (Signed)
Pt is aware.  

## 2018-02-18 NOTE — Progress Notes (Signed)
cc'd to pcp 

## 2018-02-19 ENCOUNTER — Encounter (HOSPITAL_COMMUNITY): Payer: Self-pay | Admitting: Gastroenterology

## 2018-04-10 ENCOUNTER — Encounter: Payer: Self-pay | Admitting: Gastroenterology

## 2018-05-15 ENCOUNTER — Ambulatory Visit (INDEPENDENT_AMBULATORY_CARE_PROVIDER_SITE_OTHER): Payer: Managed Care, Other (non HMO) | Admitting: Urology

## 2018-05-15 DIAGNOSIS — R1084 Generalized abdominal pain: Secondary | ICD-10-CM | POA: Diagnosis not present

## 2018-05-15 DIAGNOSIS — N401 Enlarged prostate with lower urinary tract symptoms: Secondary | ICD-10-CM

## 2018-06-17 ENCOUNTER — Ambulatory Visit: Payer: Managed Care, Other (non HMO) | Admitting: Gastroenterology

## 2018-06-27 ENCOUNTER — Encounter: Payer: Self-pay | Admitting: Gastroenterology

## 2018-07-01 ENCOUNTER — Ambulatory Visit: Payer: Managed Care, Other (non HMO) | Admitting: Gastroenterology

## 2018-07-10 ENCOUNTER — Ambulatory Visit: Payer: Managed Care, Other (non HMO) | Admitting: Gastroenterology

## 2018-07-11 ENCOUNTER — Ambulatory Visit: Payer: Managed Care, Other (non HMO) | Admitting: Gastroenterology

## 2018-07-16 ENCOUNTER — Ambulatory Visit (INDEPENDENT_AMBULATORY_CARE_PROVIDER_SITE_OTHER): Payer: Managed Care, Other (non HMO)

## 2018-07-16 ENCOUNTER — Encounter: Payer: Self-pay | Admitting: Podiatry

## 2018-07-16 ENCOUNTER — Ambulatory Visit (INDEPENDENT_AMBULATORY_CARE_PROVIDER_SITE_OTHER): Payer: Managed Care, Other (non HMO) | Admitting: Podiatry

## 2018-07-16 DIAGNOSIS — M722 Plantar fascial fibromatosis: Secondary | ICD-10-CM | POA: Diagnosis not present

## 2018-07-16 DIAGNOSIS — M779 Enthesopathy, unspecified: Secondary | ICD-10-CM | POA: Diagnosis not present

## 2018-07-16 DIAGNOSIS — M778 Other enthesopathies, not elsewhere classified: Secondary | ICD-10-CM

## 2018-07-20 NOTE — Progress Notes (Signed)
Subjective: 67 year old male presents the office today for new concerns.  He states that the heel is doing much better he said no problems however he said he developed pain into the left side of his foot.  This pain is intermittent and he cannot recall what makes the pain worse or better.  He is no pain today.  No recent injury or trauma.  No change in activity level.  No swelling.  No treatment.  No other concerns. Denies any systemic complaints such as fevers, chills, nausea, vomiting. No acute changes since last appointment, and no other complaints at this time.   Objective: AAO x3, NAD DP/PT pulses palpable bilaterally, CRT less than 3 seconds At this time there is no area of pinpoint tenderness identified to bilateral lower extremity's.  However on the left side he does get some discomfort mostly on the top of occasionally however he is not expensing any discomfort today.  Posterior tibial tendon appears to be intact.  Extensor, flexor tendons intact.  No edema, erythema. No open lesions or pre-ulcerative lesions.  No pain with calf compression, swelling, warmth, erythema  Assessment: Likely capsulitis, tendinitis left foot  Plan: -All treatment options discussed with the patient including all alternatives, risks, complications.  -He is having no pain today so held off on x-rays I did review the old x-rays with him.  We discussed changing shoes to try to wear shoe with more arch support.  If symptoms do not resolve to let me know or if they worsen. -Patient encouraged to call the office with any questions, concerns, change in symptoms.   Vivi Barrack DPM

## 2018-09-12 ENCOUNTER — Ambulatory Visit (INDEPENDENT_AMBULATORY_CARE_PROVIDER_SITE_OTHER): Payer: Managed Care, Other (non HMO) | Admitting: Gastroenterology

## 2018-09-12 ENCOUNTER — Encounter: Payer: Self-pay | Admitting: Gastroenterology

## 2018-09-12 DIAGNOSIS — R1310 Dysphagia, unspecified: Secondary | ICD-10-CM

## 2018-09-12 DIAGNOSIS — R1319 Other dysphagia: Secondary | ICD-10-CM

## 2018-09-12 DIAGNOSIS — B3781 Candidal esophagitis: Secondary | ICD-10-CM

## 2018-09-12 DIAGNOSIS — Z1211 Encounter for screening for malignant neoplasm of colon: Secondary | ICD-10-CM | POA: Diagnosis not present

## 2018-09-12 DIAGNOSIS — K222 Esophageal obstruction: Secondary | ICD-10-CM

## 2018-09-12 NOTE — Assessment & Plan Note (Signed)
NEXT TCS AUG 2025

## 2018-09-12 NOTE — Assessment & Plan Note (Signed)
SYMPTOMS CONTROLLED/RESOLVED.  CONTINUE TO MONITOR SYMPTOMS. 

## 2018-09-12 NOTE — Progress Notes (Signed)
Subjective:    Patient ID: [name], male    DOB: [birth year], 67 y.o.   MRN: [id number]  Elfredia Nevins, MD  HPI Feeling pretty good. BMs: regular(#4, METFORMIN MAY CAUSE LIQUID STOOLS).  PT DENIES FEVER, CHILLS, HEMATOCHEZIA, HEMATEMESIS, nausea, vomiting, melena, CHEST PAIN, SHORTNESS OF BREATH, CHANGE IN BOWEL IN HABITS, constipation, abdominal pain, problems swallowing, problems with sedation, OR heartburn or indigestion.  Past Medical History:  Diagnosis Date  . Candida esophagitis (HCC) OCT 2014  . Deafness in right ear   . Diabetes mellitus without complication (HCC)   . Esophageal stricture SEP 2011   FOOD IMPACTION-->SAV DIL 16 MM  . GERD (gastroesophageal reflux disease)   . Hypertension   . Kidney stone   . Myasthenia gravis (HCC)   . Thyroid disease    Past Surgical History:  Procedure Laterality Date  . BIOPSY  02/14/2018     . CHOLECYSTECTOMY    . COLONOSCOPY  2005   Dr. Lovell Sheehan  . COLONOSCOPY N/A 07/17/2014     . ESOPHAGOGASTRODUODENOSCOPY N/A 07/25/2013   Shallow ulcer in the cardia, mid esophageal web, stricture at GE junction  . ESOPHAGOGASTRODUODENOSCOPY N/A 02/14/2018     . ESOPHAGOGASTRODUODENOSCOPY (EGD) WITH ESOPHAGEAL DILATION N/A 08/29/2013   Candida esophagitis, small hiatal hernia, moderate nonerosive gastritis, mid esophageal web  . HERNIA REPAIR     right inguinal  . KIDNEY STONE SURGERY    . NASAL SEPTUM SURGERY    . PALATE SURGERY        . SAVORY DILATION N/A 02/14/2018     . THYMECTOMY    . TONSILLECTOMY    . UPPER GASTROINTESTINAL ENDOSCOPY  SEP 2011   SAV DIL   Allergies  Allergen Reactions  . Levofloxacin Anaphylaxis  . Tequin [Gatifloxacin] Anaphylaxis  . Aminoglycosides Other (See Comments)    REACTION: Myasthenia Gravis (MG) Medication Alert  . Avelox [Moxifloxacin Hcl In Nacl] Other (See Comments)    REACTION: FLUOROQUINOLONE ANTIBIOTICS: Myasthenia Gravis (MG) Medication Alert  . Botox [Onabotulinumtoxina] Other  (See Comments)    REACTION: Myasthenia Gravis (MG) Medication Alert  . Botulinum Toxins Other (See Comments)    REACTION: Myasthenia Gravis (MG) Medication Alert  . Calcium Channel Blockers Other (See Comments)    (BLOOD PRESSURE MEDICATION) REACTION: Myasthenia Gravis (MG) Medication Alert  . Ciprofloxacin Other (See Comments)    REACTION: FLUOROQUINOLONE ANTIBIOTICS: Myasthenia Gravis (MG) Medication Alert  . Contrast Media [Iodinated Diagnostic Agents] Other (See Comments)    MD told pt cannot have  . Curare [Tubocurarine] Other (See Comments)    (Usually only used during surgery)  REACTION: Myasthenia Gravis (MG) Medication Alert  . Dysport [Abobotulinumtoxina] Other (See Comments)    REACTION: Myasthenia Gravis (MG) Medication Alert  . Erythromycin Other (See Comments)    REACTION: Myasthenia Gravis (MG) Medication Alert  . Factive [Gemifloxacin] Other (See Comments)    REACTION: FLUOROQUINOLONE ANTIBIOTICS: Myasthenia Gravis (MG) Medication Alert  . Floxin [Ofloxacin] Other (See Comments)    REACTION: FLUOROQUINOLONE ANTIBIOTICS: Myasthenia Gravis (MG) Medication Alert  . Gentamycin [Gentamicin] Other (See Comments)    REACTION:Myasthenia Gravis (MG) Medication Alert  . Interferons Other (See Comments)    REACTION: Myasthenia Gravis (MG) Medication Alert  . Kanamycin Other (See Comments)    REACTION: Myasthenia Gravis (MG) Medication Alert  . Ketek [Telithromycin] Other (See Comments)    REACTION: Myasthenia Gravis (MG) Medication Alert  . Levaquin [Levofloxacin In D5w] Other (See Comments)    REACTION: FLUOROQUINOLONE ANTIBIOTICS: Myasthenia Gravis (MG) Medication  Alert  . Macrolides And Ketolides Other (See Comments)    REACTION: Myasthenia Gravis (MG) Medication Alert  . Magnesium-Containing Compounds Other (See Comments)    Unknown  . Myobloc [Rimabotulinumtoxinb] Other (See Comments)    REACTION: Myasthenia Gravis (MG) Medication Alert  . Neomycin Other (See Comments)     REACTION: Myasthenia Gravis (MG) Medication Alert  . Noroxin [Norfloxacin] Other (See Comments)    REACTION: FLUOROQUINOLONE ANTIBIOTICS: Myasthenia Gravis (MG) Medication Alert  . Other Other (See Comments)    MAGNESIUM SALTS: Milk of Magnesia, some antacids (Maalox, Mylanta) REACTION: Myasthenia Gravis (MG) Medication Alerta  . Penicillamine Other (See Comments)    D-PENICILLAMINE *DO NOT CONFUSE WITH PENICILLIN*  REACTION: Myasthenia Gravis (MG) Medication Alert  . Procainamide Other (See Comments)    REACTION: Myasthenia Gravis (MG) Medication Alert  . Propranolol Other (See Comments)    REACTION: Myasthenia Gravis (MG) Medication Alert  . Quinidine Other (See Comments)    REACTION: Myasthenia Gravis (MG) Medication Alert  . Quinine Derivatives Other (See Comments)    REACTION: Myasthenia Gravis (MG) Medication Alert  . Streptomycin Other (See Comments)    REACTION:  Myasthenia Gravis (MG) Medication Alert  . Timolol Other (See Comments)    REACTION: Myasthenia Gravis (MG) Medication Alert  . Tobramycin Other (See Comments)    REACTION: Myasthenia Gravis (MG) Medication Alert  . Xeomin [Incobotulinumtoxina] Other (See Comments)    REACTION: Myasthenia Gravis (MG) Medication Alert  . Zithromax [Azithromycin] Other (See Comments)    REACTION: Myasthenia Gravis (MG) Medication Alert    Current Outpatient Medications  Medication Sig    . acyclovir (ZOVIRAX) 400 MG tablet Take 800 mg by mouth every morning.     [name] azaTHIOprine (IMURAN) 50 MG tablet Take 150 mg by mouth every morning.     . Calcium Carbonate-Vitamin D (CALCIUM 600+D) 600-200 MG-UNIT TABS Take 2 tablets by mouth every morning.     [name] losartan (COZAAR) 100 MG tablet Take 100 mg by mouth daily.     . metFORMIN (GLUCOPHAGE-XR) 500 MG 24 hr tablet Take 1,000 mg by mouth 2 (two) times daily.     . Omega-3 Fatty Acids (FISH OIL) 1200 MG CAPS Take 2,400 mg by mouth every morning.     [name] omeprazole (PRILOSEC) 20 MG capsule  TAKE 1 CAPSULE BY MOUTH TWICE DAILY (Patient taking differently: TAKE 20 MG BY MOUTH TWICE DAILY)    . Saw Palmetto, Serenoa repens, (SAW PALMETTO PO) Take by mouth daily.    [name] SYNTHROID 137 MCG tablet Take 137 mcg by mouth daily before breakfast.     . tamsulosin (FLOMAX) 0.4 MG CAPS capsule Take 0.4 mg by mouth at bedtime.     . vitamin C (ASCORBIC ACID) 500 MG tablet Take 1,000 mg by mouth every morning.     . vitamin E 400 UNIT capsule Take 800 Units by mouth every morning.     .       Review of Systems PER HPI OTHERWISE ALL SYSTEMS ARE NEGATIVE.    Objective:   Physical Exam  Constitutional: He is oriented to person, place, and time. He appears well-developed and well-nourished. No distress.  HENT:  Head: Normocephalic and atraumatic.  Mouth/Throat: Oropharynx is clear and moist. No oropharyngeal exudate.  Eyes: Pupils are equal, round, and reactive to light. No scleral icterus.  Neck: Normal range of motion. Neck supple.  Cardiovascular: Normal rate, regular rhythm and normal heart sounds.  Pulmonary/Chest: Effort normal and breath sounds normal. No respiratory  distress.  Abdominal: Soft. Bowel sounds are normal. He exhibits no distension. There is no tenderness.  Musculoskeletal: He exhibits no edema.  Lymphadenopathy:    He has no cervical adenopathy.  Neurological: He is alert and oriented to person, place, and time.  NO  NEW FOCAL DEFICITS  Psychiatric: He has a normal mood and affect.  Vitals reviewed.     Assessment & Plan:

## 2018-09-12 NOTE — Assessment & Plan Note (Signed)
LIKLEY DUE TO GERD.  DRINK WATER TO KEEP YOUR URINE LIGHT YELLOW. AVOID REFLUX TRIGGERS.  HANDOUT GIVEN. CONTINUE OMEPRAZOLE.  TAKE 30 MINUTES PRIOR TO YOUR FIRST MEAL.  FOLLOW UP IN 6 MOS- ONE YEAR.

## 2018-09-12 NOTE — Patient Instructions (Signed)
DRINK WATER TO KEEP YOUR URINE LIGHT YELLOW.  AVOID REFLUX TRIGGERS. SEE INFO BELOW.  CONTINUE OMEPRAZOLE.  TAKE 30 MINUTES PRIOR TO YOUR FIRST MEAL.  FOLLOW UP IN 6 MOS- ONE YEAR.   Lifestyle and home remedies TO CONTROL HEARTBURN/REFLUX You may eliminate or reduce the frequency of heartburn by making the following lifestyle changes:  . Control your weight. Being overweight is a major risk factor for heartburn and GERD. Excess pounds put pressure on your abdomen, pushing up your stomach and causing acid to back up into your esophagus.   . Eat smaller meals. 4 TO 6 MEALS A DAY. This reduces pressure on the lower esophageal sphincter, helping to prevent the valve from opening and acid from washing back into your esophagus.   Allena Earing your belt. Clothes that fit tightly around your waist put pressure on your abdomen and the lower esophageal sphincter.   . Eliminate heartburn triggers. Everyone has specific triggers. Common triggers such as fatty or fried foods, spicy food, tomato sauce, carbonated beverages, alcohol, chocolate, mint, garlic, onion, caffeine and nicotine may make heartburn worse.   [name] Avoid stooping or bending. Tying your shoes is OK. Bending over for longer periods to weed your garden isn't, especially soon after eating.   . Don't lie down after a meal. Wait at least three to four hours after eating before going to bed, and don't lie down right after eating.   Alternative medicine . Several home remedies exist for treating GERD, but they provide only temporary relief. They include drinking baking soda (sodium bicarbonate) added to water or drinking other fluids such as baking soda mixed with cream of tartar and water. . Although these liquids create temporary relief by neutralizing, washing away or buffering acids, eventually they aggravate the situation by adding gas and fluid to your stomach, increasing pressure and causing more acid reflux. Further, adding more sodium to  your diet may increase your blood pressure and add stress to your heart, and excessive bicarbonate ingestion can alter the acid-base balance in your body.

## 2018-09-13 NOTE — Progress Notes (Signed)
ON RECALL  °

## 2018-09-13 NOTE — Progress Notes (Signed)
CC'ED TO PCP 

## 2018-09-14 ENCOUNTER — Encounter (HOSPITAL_COMMUNITY): Payer: Self-pay | Admitting: Emergency Medicine

## 2018-09-14 ENCOUNTER — Other Ambulatory Visit: Payer: Self-pay

## 2018-09-14 ENCOUNTER — Emergency Department (HOSPITAL_COMMUNITY): Payer: Managed Care, Other (non HMO)

## 2018-09-14 ENCOUNTER — Emergency Department (HOSPITAL_COMMUNITY)
Admission: EM | Admit: 2018-09-14 | Discharge: 2018-09-14 | Disposition: A | Discharge status: Home / Self Care | Payer: Managed Care, Other (non HMO) | Attending: Emergency Medicine | Admitting: Emergency Medicine

## 2018-09-14 DIAGNOSIS — I10 Essential (primary) hypertension: Secondary | ICD-10-CM | POA: Diagnosis not present

## 2018-09-14 DIAGNOSIS — R509 Fever, unspecified: Secondary | ICD-10-CM

## 2018-09-14 DIAGNOSIS — E119 Type 2 diabetes mellitus without complications: Secondary | ICD-10-CM | POA: Insufficient documentation

## 2018-09-14 DIAGNOSIS — Z7984 Long term (current) use of oral hypoglycemic drugs: Secondary | ICD-10-CM | POA: Diagnosis not present

## 2018-09-14 DIAGNOSIS — Z79899 Other long term (current) drug therapy: Secondary | ICD-10-CM | POA: Insufficient documentation

## 2018-09-14 LAB — CBC WITH DIFFERENTIAL/PLATELET
Abs Immature Granulocytes: 0.17 10*3/uL — ABNORMAL HIGH (ref 0.00–0.07)
Basophils Absolute: 0 10*3/uL (ref 0.0–0.1)
Basophils Relative: 0 %
Eosinophils Absolute: 0.1 10*3/uL (ref 0.0–0.5)
Eosinophils Relative: 1 %
HCT: 39.6 % (ref 39.0–52.0)
Hemoglobin: 13.1 g/dL (ref 13.0–17.0)
Immature Granulocytes: 1 %
Lymphocytes Relative: 3 %
Lymphs Abs: 0.4 10*3/uL — ABNORMAL LOW (ref 0.7–4.0)
MCH: 31.2 pg (ref 26.0–34.0)
MCHC: 33.1 g/dL (ref 30.0–36.0)
MCV: 94.3 fL (ref 80.0–100.0)
Monocytes Absolute: 1.5 10*3/uL — ABNORMAL HIGH (ref 0.1–1.0)
Monocytes Relative: 12 %
Neutro Abs: 10.2 10*3/uL — ABNORMAL HIGH (ref 1.7–7.7)
Neutrophils Relative %: 83 %
Platelets: 161 10*3/uL (ref 150–400)
RBC: 4.2 MIL/uL — ABNORMAL LOW (ref 4.22–5.81)
RDW: 14.1 % (ref 11.5–15.5)
WBC: 12.4 10*3/uL — ABNORMAL HIGH (ref 4.0–10.5)
nRBC: 0 % (ref 0.0–0.2)

## 2018-09-14 LAB — URINALYSIS, ROUTINE W REFLEX MICROSCOPIC
Bacteria, UA: NONE SEEN
Bilirubin Urine: NEGATIVE
Glucose, UA: 50 mg/dL — AB
Hgb urine dipstick: NEGATIVE
Ketones, ur: 20 mg/dL — AB
Leukocytes, UA: NEGATIVE
Nitrite: NEGATIVE
Protein, ur: 30 mg/dL — AB
Specific Gravity, Urine: 1.027 (ref 1.005–1.030)
pH: 6 (ref 5.0–8.0)

## 2018-09-14 LAB — COMPREHENSIVE METABOLIC PANEL
ALT: 21 U/L (ref 0–44)
AST: 16 U/L (ref 15–41)
Albumin: 3.9 g/dL (ref 3.5–5.0)
Alkaline Phosphatase: 46 U/L (ref 38–126)
Anion gap: 11 (ref 5–15)
BUN: 12 mg/dL (ref 8–23)
CO2: 23 mmol/L (ref 22–32)
Calcium: 8.9 mg/dL (ref 8.9–10.3)
Chloride: 104 mmol/L (ref 98–111)
Creatinine, Ser: 1.02 mg/dL (ref 0.61–1.24)
GFR calc Af Amer: >60 mL/min (ref >60)
GFR calc non Af Amer: >60 mL/min (ref >60)
Glucose, Bld: 193 mg/dL — ABNORMAL HIGH (ref 70–99)
Potassium: 3.9 mmol/L (ref 3.5–5.1)
Sodium: 138 mmol/L (ref 135–145)
Total Bilirubin: 1.7 mg/dL — ABNORMAL HIGH (ref 0.3–1.2)
Total Protein: 7 g/dL (ref 6.5–8.1)

## 2018-09-14 MED ORDER — SULFAMETHOXAZOLE-TRIMETHOPRIM 800-160 MG PO TABS
1.0000 | ORAL_TABLET | Freq: Once | ORAL | Status: AC
  Administered 2018-09-14: 1 via ORAL
  Filled 2018-09-14: qty 1

## 2018-09-14 MED ORDER — SODIUM CHLORIDE 0.9 % IV BOLUS
1000.0000 mL | Freq: Once | INTRAVENOUS | Status: AC
  Administered 2018-09-14: 1000 mL via INTRAVENOUS

## 2018-09-14 MED ORDER — SULFAMETHOXAZOLE-TRIMETHOPRIM 800-160 MG PO TABS: 1.0000 | ORAL_TABLET | Freq: Two times a day (BID) | ORAL | 0 refills | Status: AC

## 2018-09-14 NOTE — ED Triage Notes (Signed)
Pt states on Thursday began with chills and then felt better on Friday but this morning woke up with temp of 101.1.  Pt states he just feels achy all over with a cough.

## 2018-09-14 NOTE — ED Triage Notes (Signed)
Pt reports Advil last taken at 0900 this morning.

## 2018-09-14 NOTE — Discharge Instructions (Addendum)
Take Tylenol for fever.  Follow-up with your family doctor beginning of the week for recheck.

## 2018-09-14 NOTE — ED Provider Notes (Signed)
[facility] EMERGENCY DEPARTMENT Provider Note   CSN: [id number] Arrival date & time: 09/14/18  1203     History   Chief Complaint Chief Complaint  Patient presents with  . Fever    HPI [name] is a 67 y.o. male.  Patient complains of fever and cough and aches.  Patient takes Imuran for myasthenia gravis.   Patient also has a history of diabetes  The history is provided by the patient. No language interpreter was used.  Fever   This is a new problem. The current episode started 2 days ago. The problem occurs constantly. The problem has not changed since onset.The maximum temperature noted was 101 to 101.9 F. The temperature was taken using an oral thermometer. Pertinent negatives include no chest pain, no diarrhea, no congestion, no headaches and no cough. He has tried nothing for the symptoms. The treatment provided no relief.    Past Medical History:  Diagnosis Date  . Candida esophagitis (HCC) OCT 2014  . Deafness in right ear   . Diabetes mellitus without complication (HCC)   . Esophageal stricture SEP 2011   FOOD IMPACTION-->SAV DIL 16 MM  . GERD (gastroesophageal reflux disease)   . Hypertension   . Kidney stone   . Myasthenia gravis (HCC)   . Thyroid disease     Patient Active Problem List   Diagnosis Date Noted  . Dysphagia   . Plantar fasciitis 09/04/2017  . Cough 03/03/2017  . Bacterial pharyngitis 03/03/2017  . Colon cancer screening 06/03/2014  . Diverticulitis large intestine 04/01/2014  . Diarrhea 04/01/2014  . Candida esophagitis (HCC) 11/26/2013  . UTI (lower urinary tract infection) 06/07/2012  . Myasthenia gravis (HCC)   . Hypertension   . Esophageal stricture 07/21/2010    Past Surgical History:  Procedure Laterality Date  . BIOPSY  02/14/2018   Procedure: BIOPSY;  Surgeon: West Bali, MD;  Location: [facility];  Service: Endoscopy;;  gastric  . CHOLECYSTECTOMY    . COLONOSCOPY  2005   Dr. Lovell Sheehan  . COLONOSCOPY N/A  07/17/2014   Procedure: COLONOSCOPY;  Surgeon: West Bali, MD;  Location: [facility];  Service: Endoscopy;  Laterality: N/A;  9:30-rescheduled 8/28 same time Soledad Gerlach to notify pt  . ESOPHAGOGASTRODUODENOSCOPY N/A 07/25/2013   Shallow ulcer in the cardia, mid esophageal web, stricture at GE junction  . ESOPHAGOGASTRODUODENOSCOPY N/A 02/14/2018   Procedure: ESOPHAGOGASTRODUODENOSCOPY (EGD);  Surgeon: West Bali, MD;  Location: [facility];  Service: Endoscopy;  Laterality: N/A;  11:15am  . ESOPHAGOGASTRODUODENOSCOPY (EGD) WITH ESOPHAGEAL DILATION N/A 08/29/2013   Candida esophagitis, small hiatal hernia, moderate nonerosive gastritis, mid esophageal web  . HERNIA REPAIR     right inguinal  . KIDNEY STONE SURGERY    . NASAL SEPTUM SURGERY    . PALATE SURGERY        . SAVORY DILATION N/A 02/14/2018   Procedure: SAVORY DILATION;  Surgeon: West Bali, MD;  Location: [facility];  Service: Endoscopy;  Laterality: N/A;  . THYMECTOMY    . TONSILLECTOMY    . UPPER GASTROINTESTINAL ENDOSCOPY  SEP 2011   SAV DIL        Home Medications    Prior to Admission medications   Medication Sig Start Date End Date Taking? Authorizing Provider  azaTHIOprine (IMURAN) 50 MG tablet Take 150 mg by mouth every morning.    Yes [provider]  Calcium Carbonate-Vitamin D (CALCIUM 600+D) 600-200 MG-UNIT TABS Take 2 tablets by  mouth every morning.    Yes [provider]  ibuprofen (IBU) 800 MG tablet Take 800 mg by mouth as needed for fever or headache.  05/15/18  Yes [provider]  losartan (COZAAR) 100 MG tablet Take 100 mg by mouth daily.  07/05/17  Yes [provider]  metFORMIN (GLUCOPHAGE-XR) 500 MG 24 hr tablet Take 1,000 mg by mouth 2 (two) times daily.  06/30/13  Yes [provider]  Omega-3 Fatty Acids (FISH OIL) 1200 MG CAPS Take 2,400 mg by mouth every morning.    Yes [provider]  omeprazole (PRILOSEC) 20 MG capsule TAKE 1  CAPSULE BY MOUTH TWICE DAILY Patient taking differently: TAKE 20 MG BY MOUTH TWICE DAILY 07/27/17  Yes Tiffany Kocher, PA-C  Saw Palmetto, Serenoa repens, (SAW PALMETTO PO) Take by mouth daily.   Yes [provider]  SYNTHROID 137 MCG tablet Take 137 mcg by mouth daily before breakfast.  07/03/13  Yes [provider]  tamsulosin (FLOMAX) 0.4 MG CAPS capsule Take 0.4 mg by mouth at bedtime.    Yes [provider]  vitamin C (ASCORBIC ACID) 500 MG tablet Take 1,000 mg by mouth every morning.    Yes [provider]  vitamin E 400 UNIT capsule Take 800 Units by mouth every morning.    Yes [provider]    Family History Family History  Problem Relation Age of Onset  . Liver disease Father        age 69, deceased  . Colon cancer Neg Hx     Social History Social History   Tobacco Use  . Smoking status: Never Smoker  . Smokeless tobacco: Never Used  Substance Use Topics  . Alcohol use: No  . Drug use: No     Allergies   Levofloxacin; Tequin [gatifloxacin]; Aminoglycosides; Avelox [moxifloxacin hcl in nacl]; Botox [onabotulinumtoxina]; Botulinum toxins; Calcium channel blockers; Ciprofloxacin; Contrast media [iodinated diagnostic agents]; Curare [tubocurarine]; Dysport [abobotulinumtoxina]; Erythromycin; Factive [gemifloxacin]; Floxin [ofloxacin]; Gentamycin [gentamicin]; Interferons; Kanamycin; Ketek [telithromycin]; Levaquin [levofloxacin in d5w]; Macrolides and ketolides; Magnesium-containing compounds; Myobloc [rimabotulinumtoxinb]; Neomycin; Noroxin [norfloxacin]; Other; Penicillamine; Procainamide; Propranolol; Quinidine; Quinine derivatives; Streptomycin; Timolol; Tobramycin; Xeomin [incobotulinumtoxina]; and Zithromax [azithromycin]   Review of Systems Review of Systems  Constitutional: Positive for fever. Negative for appetite change and fatigue.  HENT: Negative for congestion, ear discharge and sinus pressure.   Eyes: Negative for  discharge.  Respiratory: Negative for cough.   Cardiovascular: Negative for chest pain.  Gastrointestinal: Negative for abdominal pain and diarrhea.  Genitourinary: Negative for frequency and hematuria.  Musculoskeletal: Negative for back pain.  Skin: Negative for rash.  Neurological: Negative for seizures and headaches.  Psychiatric/Behavioral: Negative for hallucinations.     Physical Exam Updated Vital Signs BP 129/71   Pulse 79   Temp 99.8 F (37.7 C) (Oral)   Resp (!) 22   Ht 6' (1.829 m)   Wt 97.5 kg   SpO2 97%   BMI 29.16 kg/m   Physical Exam  Constitutional: He is oriented to person, place, and time. He appears well-developed.  HENT:  Head: Normocephalic.  Eyes: Conjunctivae and EOM are normal. No scleral icterus.  Neck: Neck supple. No thyromegaly present.  Cardiovascular: Normal rate and regular rhythm. Exam reveals no gallop and no friction rub.  No murmur heard. Pulmonary/Chest: No stridor. He has no wheezes. He has no rales. He exhibits no tenderness.  Abdominal: He exhibits no distension. There is no tenderness. There is no rebound.  Musculoskeletal:  Normal range of motion. He exhibits no edema.  Lymphadenopathy:    He has no cervical adenopathy.  Neurological: He is oriented to person, place, and time. He exhibits normal muscle tone. Coordination normal.  Skin: No rash noted. No erythema.  Psychiatric: He has a normal mood and affect. His behavior is normal.     ED Treatments / Results  Labs (all labs ordered are listed, but only abnormal results are displayed) Labs Reviewed  CBC WITH DIFFERENTIAL/PLATELET - Abnormal; Notable for the following components:      Result Value   WBC 12.4 (*)    RBC 4.20 (*)    Neutro Abs 10.2 (*)    Lymphs Abs 0.4 (*)    Monocytes Absolute 1.5 (*)    Abs Immature Granulocytes 0.17 (*)    All other components within normal limits  COMPREHENSIVE METABOLIC PANEL - Abnormal; Notable for the following components:    Glucose, Bld 193 (*)    Total Bilirubin 1.7 (*)    All other components within normal limits  URINALYSIS, ROUTINE W REFLEX MICROSCOPIC - Abnormal; Notable for the following components:   Color, Urine AMBER (*)    Glucose, UA 50 (*)    Ketones, ur 20 (*)    Protein, ur 30 (*)    All other components within normal limits  CULTURE, BLOOD (ROUTINE X 2)  CULTURE, BLOOD (ROUTINE X 2)  URINE CULTURE    EKG None  Radiology Dg Chest 2 View  Result Date: 09/14/2018 CLINICAL DATA:  Fever, chills, body aches EXAM: CHEST - 2 VIEW COMPARISON:  06/01/2017 FINDINGS: Lungs are clear.  No pleural effusion or pneumothorax. The heart is normal in size. Mild degenerative changes of the visualized thoracolumbar spine. Median sternotomy. IMPRESSION: Normal chest radiographs. Electronically Signed   By: Charline Bills M.D.   On: 09/14/2018 14:27    Procedures Procedures (including critical care time)  Medications Ordered in ED Medications  sulfamethoxazole-trimethoprim (BACTRIM DS,SEPTRA DS) 800-160 MG per tablet 1 tablet (has no administration in time range)  sodium chloride 0.9 % bolus 1,000 mL (0 mLs Intravenous Stopped 09/14/18 1411)     Initial Impression / Assessment and Plan / ED Course  I have reviewed the triage vital signs and the nursing notes.  Pertinent labs & imaging results that were available during my care of the patient were reviewed by me and considered in my medical decision making (see chart for details).     Labs show elevated white count.  Chest x-ray unremarkable urine unremarkable.  Patient will have blood cultures done urine culture and started on Bactrim empirically.  And he will follow-up with his PCP next week  Final Clinical Impressions(s) / ED Diagnoses   Final diagnoses:  None    ED Discharge Orders    None       Bethann Berkshire, MD 09/14/18 1629

## 2018-09-16 LAB — URINE CULTURE: Culture: NO GROWTH

## 2018-09-19 LAB — CULTURE, BLOOD (ROUTINE X 2)
Culture: NO GROWTH
Culture: NO GROWTH
Special Requests: ADEQUATE

## 2018-10-10 ENCOUNTER — Other Ambulatory Visit: Payer: Self-pay | Admitting: Gastroenterology

## 2018-11-02 ENCOUNTER — Other Ambulatory Visit: Payer: Self-pay

## 2018-11-02 ENCOUNTER — Encounter (HOSPITAL_COMMUNITY): Payer: Self-pay | Admitting: Emergency Medicine

## 2018-11-02 ENCOUNTER — Emergency Department (HOSPITAL_COMMUNITY)
Admission: EM | Admit: 2018-11-02 | Discharge: 2018-11-02 | Disposition: A | Discharge status: Home / Self Care | Payer: Managed Care, Other (non HMO) | Attending: Emergency Medicine | Admitting: Emergency Medicine

## 2018-11-02 ENCOUNTER — Emergency Department (HOSPITAL_COMMUNITY): Payer: Managed Care, Other (non HMO)

## 2018-11-02 DIAGNOSIS — I10 Essential (primary) hypertension: Secondary | ICD-10-CM | POA: Diagnosis not present

## 2018-11-02 DIAGNOSIS — Z7984 Long term (current) use of oral hypoglycemic drugs: Secondary | ICD-10-CM | POA: Diagnosis not present

## 2018-11-02 DIAGNOSIS — J189 Pneumonia, unspecified organism: Secondary | ICD-10-CM

## 2018-11-02 DIAGNOSIS — Z79899 Other long term (current) drug therapy: Secondary | ICD-10-CM | POA: Diagnosis not present

## 2018-11-02 DIAGNOSIS — E119 Type 2 diabetes mellitus without complications: Secondary | ICD-10-CM | POA: Diagnosis not present

## 2018-11-02 DIAGNOSIS — R0602 Shortness of breath: Secondary | ICD-10-CM | POA: Diagnosis present

## 2018-11-02 LAB — CBC WITH DIFFERENTIAL/PLATELET
Abs Immature Granulocytes: 0.5 10*3/uL — ABNORMAL HIGH (ref 0.00–0.07)
Basophils Absolute: 0.1 10*3/uL (ref 0.0–0.1)
Basophils Relative: 1 %
Eosinophils Absolute: 0.1 10*3/uL (ref 0.0–0.5)
Eosinophils Relative: 1 %
HCT: 40.2 % (ref 39.0–52.0)
Hemoglobin: 12.9 g/dL — ABNORMAL LOW (ref 13.0–17.0)
Immature Granulocytes: 8 %
Lymphocytes Relative: 6 %
Lymphs Abs: 0.4 10*3/uL — ABNORMAL LOW (ref 0.7–4.0)
MCH: 30 pg (ref 26.0–34.0)
MCHC: 32.1 g/dL (ref 30.0–36.0)
MCV: 93.5 fL (ref 80.0–100.0)
Monocytes Absolute: 0.7 10*3/uL (ref 0.1–1.0)
Monocytes Relative: 12 %
Neutro Abs: 4.7 10*3/uL (ref 1.7–7.7)
Neutrophils Relative %: 72 %
Platelets: 195 10*3/uL (ref 150–400)
RBC: 4.3 MIL/uL (ref 4.22–5.81)
RDW: 14.7 % (ref 11.5–15.5)
WBC: 6.5 10*3/uL (ref 4.0–10.5)
nRBC: 0 % (ref 0.0–0.2)

## 2018-11-02 LAB — BASIC METABOLIC PANEL
Anion gap: 10 (ref 5–15)
BUN: 17 mg/dL (ref 8–23)
CO2: 26 mmol/L (ref 22–32)
Calcium: 8.6 mg/dL — ABNORMAL LOW (ref 8.9–10.3)
Chloride: 101 mmol/L (ref 98–111)
Creatinine, Ser: 1.05 mg/dL (ref 0.61–1.24)
GFR calc Af Amer: >60 mL/min (ref >60)
GFR calc non Af Amer: >60 mL/min (ref >60)
Glucose, Bld: 299 mg/dL — ABNORMAL HIGH (ref 70–99)
Potassium: 4.1 mmol/L (ref 3.5–5.1)
Sodium: 137 mmol/L (ref 135–145)

## 2018-11-02 MED ORDER — PREDNISONE 10 MG PO TABS: 40.0000 mg | ORAL_TABLET | Freq: Every day | ORAL | 0 refills | Status: DC

## 2018-11-02 MED ORDER — METHYLPREDNISOLONE SODIUM SUCC 125 MG IJ SOLR
125.0000 mg | Freq: Once | INTRAMUSCULAR | Status: AC
  Administered 2018-11-02: 125 mg via INTRAVENOUS
  Filled 2018-11-02: qty 2

## 2018-11-02 MED ORDER — DOXYCYCLINE HYCLATE 100 MG PO CAPS: 100.0000 mg | ORAL_CAPSULE | Freq: Two times a day (BID) | ORAL | 0 refills | Status: DC

## 2018-11-02 MED ORDER — SODIUM CHLORIDE 0.9 % IV SOLN
1.0000 g | Freq: Once | INTRAVENOUS | Status: AC
  Administered 2018-11-02: 1 g via INTRAVENOUS
  Filled 2018-11-02: qty 10

## 2018-11-02 MED ORDER — SODIUM CHLORIDE 0.9 % IV SOLN
INTRAVENOUS | Status: DC
  Administered 2018-11-02: 08:00:00 via INTRAVENOUS

## 2018-11-02 MED ORDER — ALBUTEROL SULFATE (2.5 MG/3ML) 0.083% IN NEBU
5.0000 mg | INHALATION_SOLUTION | Freq: Once | RESPIRATORY_TRACT | Status: AC
  Administered 2018-11-02: 5 mg via RESPIRATORY_TRACT
  Filled 2018-11-02: qty 6

## 2018-11-02 MED ORDER — SODIUM CHLORIDE 0.9 % IV BOLUS
500.0000 mL | Freq: Once | INTRAVENOUS | Status: AC
  Administered 2018-11-02: 500 mL via INTRAVENOUS

## 2018-11-02 MED ORDER — METHYLPREDNISOLONE SODIUM SUCC 125 MG IJ SOLR: 125.0000 mg | Freq: Once | INTRAMUSCULAR | Status: DC

## 2018-11-02 NOTE — ED Triage Notes (Signed)
Patient is having shortness of breath x 1 week, saw physician on Monday given breathing treatment, inhaler, and Codeine syrup without any relief.  O2 86% after walking to treatment room.

## 2018-11-02 NOTE — Discharge Instructions (Addendum)
Continue use albuterol inhaler 2 puffs every 6 hours.  Take the prednisone as directed to help suppress the cough.  Watch her blood sugars.  Take the antibiotic doxycycline as directed for the next 7 days.  Stop the Augmentin antibiotic.  Follow-up with your doctor.  It is important that follow-up chest x-rays are done to make sure that today's findings clear.  Return for any new or worse symptoms.

## 2018-11-02 NOTE — ED Provider Notes (Addendum)
[facility] EMERGENCY DEPARTMENT Provider Note   CSN: [id number] Arrival date & time: 11/02/18  4098     History   Chief Complaint Chief Complaint  Patient presents with  . Shortness of Breath    HPI [name] is a 67 y.o. male.  Patient with flulike symptoms.  Having shortness of breath for a week cough and congestion saw his regular doctor on Monday was given albuterol inhalers to use at home and codeine cough syrup without any relief.  Patient felt during the night that he was having more trouble breathing.  Fevers have resolved for the most part the last few days.  Patient has a history of myasthenia gravis.  His oxygen saturations on arrival were 86% on room air.  Patient does not use oxygen at home.  Patient is followed at [facility] for the myasthenia gravis.  Patient's primary care doctor is Dr. Carlena Sax.  Patient was placed on Augmentin when they saw him on Monday as well.  No nausea vomiting or diarrhea.  Patient also has a history of diabetes.     Past Medical History:  Diagnosis Date  . Candida esophagitis (HCC) OCT 2014  . Deafness in right ear   . Diabetes mellitus without complication (HCC)   . Esophageal stricture SEP 2011   FOOD IMPACTION-->SAV DIL 16 MM  . GERD (gastroesophageal reflux disease)   . Hypertension   . Kidney stone   . Myasthenia gravis (HCC)   . Thyroid disease     Patient Active Problem List   Diagnosis Date Noted  . Dysphagia   . Plantar fasciitis 09/04/2017  . Cough 03/03/2017  . Bacterial pharyngitis 03/03/2017  . Colon cancer screening 06/03/2014  . Diverticulitis large intestine 04/01/2014  . Diarrhea 04/01/2014  . Candida esophagitis (HCC) 11/26/2013  . UTI (lower urinary tract infection) 06/07/2012  . Myasthenia gravis (HCC)   . Hypertension   . Esophageal stricture 07/21/2010    Past Surgical History:  Procedure Laterality Date  . BIOPSY  02/14/2018   Procedure: BIOPSY;  Surgeon: West Bali, MD;  Location: [facility];  Service: Endoscopy;;  gastric  . CHOLECYSTECTOMY    . COLONOSCOPY  2005   Dr. Lovell Sheehan  . COLONOSCOPY N/A 07/17/2014   Procedure: COLONOSCOPY;  Surgeon: West Bali, MD;  Location: [facility] END[facility] SUITE;  Service: Endoscopy;  Laterality: N/A;  9:30-rescheduled 8/28 same time Soledad Gerlach to notify pt  . ESOPHAGOGASTRODUODENOSCOPY N/A 07/25/2013   Shallow ulcer in the cardia, mid esophageal web, stricture at GE junction  . ESOPHAGOGASTRODUODENOSCOPY N/A 02/14/2018   Procedure: ESOPHAGOGASTRODUODENOSCOPY (EGD);  Surgeon: West Bali, MD;  Location: [facility];  Service: Endoscopy;  Laterality: N/A;  11:15am  . ESOPHAGOGASTRODUODENOSCOPY (EGD) WITH ESOPHAGEAL DILATION N/A 08/29/2013   Candida esophagitis, small hiatal hernia, moderate nonerosive gastritis, mid esophageal web  . HERNIA REPAIR     right inguinal  . KIDNEY STONE SURGERY    . NASAL SEPTUM SURGERY    . PALATE SURGERY        . SAVORY DILATION N/A 02/14/2018   Procedure: SAVORY DILATION;  Surgeon: West Bali, MD;  Location: [facility];  Service: Endoscopy;  Laterality: N/A;  . THYMECTOMY    . TONSILLECTOMY    . UPPER GASTROINTESTINAL ENDOSCOPY  SEP 2011   SAV DIL        Home Medications    Prior to Admission medications   Medication Sig Start Date End Date Taking? Authorizing Provider  azaTHIOprine Tommas Olp)  50 MG tablet Take 150 mg by mouth every morning.     [provider]  Calcium Carbonate-Vitamin D (CALCIUM 600+D) 600-200 MG-UNIT TABS Take 2 tablets by mouth every morning.     [provider]  doxycycline (VIBRAMYCIN) 100 MG capsule Take 1 capsule (100 mg total) by mouth 2 (two) times daily. 11/02/18   Vanetta Mulders, MD  ibuprofen (IBU) 800 MG tablet Take 800 mg by mouth as needed for fever or headache.  05/15/18   [provider]  losartan (COZAAR) 100 MG tablet Take 100 mg by mouth daily.  07/05/17   [provider]  metFORMIN (GLUCOPHAGE-XR) 500 MG 24 hr tablet Take  1,000 mg by mouth 2 (two) times daily.  06/30/13   [provider]  Omega-3 Fatty Acids (FISH OIL) 1200 MG CAPS Take 2,400 mg by mouth every morning.     [provider]  omeprazole (PRILOSEC) 20 MG capsule TAKE 1 CAPSULE BY MOUTH TWICE DAILY 10/10/18   Anice Paganini, NP  predniSONE (DELTASONE) 10 MG tablet Take 4 tablets (40 mg total) by mouth daily. 11/02/18   Vanetta Mulders, MD  Saw Palmetto, Serenoa repens, (SAW PALMETTO PO) Take by mouth daily.    [provider]  SYNTHROID 137 MCG tablet Take 137 mcg by mouth daily before breakfast.  07/03/13   [provider]  tamsulosin (FLOMAX) 0.4 MG CAPS capsule Take 0.4 mg by mouth at bedtime.     [provider]  vitamin C (ASCORBIC ACID) 500 MG tablet Take 1,000 mg by mouth every morning.     [provider]  vitamin E 400 UNIT capsule Take 800 Units by mouth every morning.     [provider]    Family History Family History  Problem Relation Age of Onset  . Liver disease Father        age 49, deceased  . Colon cancer Neg Hx     Social History Social History   Tobacco Use  . Smoking status: Never Smoker  . Smokeless tobacco: Never Used  Substance Use Topics  . Alcohol use: No  . Drug use: No     Allergies   Levofloxacin; Tequin [gatifloxacin]; Aminoglycosides; Avelox [moxifloxacin hcl in nacl]; Botox [onabotulinumtoxina]; Botulinum toxins; Calcium channel blockers; Ciprofloxacin; Contrast media [iodinated diagnostic agents]; Curare [tubocurarine]; Dysport [abobotulinumtoxina]; Erythromycin; Factive [gemifloxacin]; Floxin [ofloxacin]; Gentamycin [gentamicin]; Interferons; Kanamycin; Ketek [telithromycin]; Levaquin [levofloxacin in d5w]; Macrolides and ketolides; Magnesium-containing compounds; Myobloc [rimabotulinumtoxinb]; Neomycin; Noroxin [norfloxacin]; Other; Penicillamine; Procainamide; Propranolol; Quinidine; Quinine derivatives; Streptomycin; Timolol; Tobramycin; Xeomin  [incobotulinumtoxina]; and Zithromax [azithromycin]   Review of Systems Review of Systems  Constitutional: Positive for fever.  HENT: Positive for congestion. Negative for sore throat.   Eyes: Negative for redness.  Respiratory: Positive for cough, shortness of breath and wheezing.   Cardiovascular: Negative for chest pain.  Gastrointestinal: Negative for abdominal pain, diarrhea, nausea and vomiting.  Genitourinary: Negative for dysuria.  Musculoskeletal: Positive for myalgias.  Neurological: Negative for headaches.  Hematological: Does not bruise/bleed easily.  Psychiatric/Behavioral: Negative for confusion.     Physical Exam Updated Vital Signs BP 138/68   Pulse 65   Temp 98.8 F (37.1 C) (Oral)   Resp 15   Ht 1.854 m (6\' 1" )   Wt 95.3 kg   SpO2 99%   BMI 27.71 kg/m   Physical Exam Constitutional:      General: He is not in acute distress.    Appearance: He is not toxic-appearing.  HENT:  Head: Normocephalic and atraumatic.     Mouth/Throat:     Mouth: Mucous membranes are moist.  Eyes:     Extraocular Movements: Extraocular movements intact.     Conjunctiva/sclera: Conjunctivae normal.     Pupils: Pupils are equal, round, and reactive to light.  Neck:     Musculoskeletal: Normal range of motion and neck supple.  Cardiovascular:     Rate and Rhythm: Normal rate and regular rhythm.     Pulses: Normal pulses.     Heart sounds: Normal heart sounds.  Pulmonary:     Effort: Pulmonary effort is normal. No respiratory distress.     Breath sounds: Wheezing present.  Abdominal:     General: Bowel sounds are normal.     Palpations: Abdomen is soft.     Tenderness: There is no abdominal tenderness.  Musculoskeletal: Normal range of motion.  Skin:    General: Skin is warm.  Neurological:     General: No focal deficit present.     Mental Status: He is alert and oriented to person, place, and time.      ED Treatments / Results  Labs (all labs ordered are  listed, but only abnormal results are displayed) Labs Reviewed  CBC WITH DIFFERENTIAL/PLATELET - Abnormal; Notable for the following components:      Result Value   Hemoglobin 12.9 (*)    Lymphs Abs 0.4 (*)    Abs Immature Granulocytes 0.50 (*)    All other components within normal limits  BASIC METABOLIC PANEL - Abnormal; Notable for the following components:   Glucose, Bld 299 (*)    Calcium 8.6 (*)    All other components within normal limits    EKG EKG Interpretation  Date/Time:  Saturday November 02 2018 07:00:55 EST Ventricular Rate:  66 PR Interval:    QRS Duration: 93 QT Interval:  393 QTC Calculation: 412 R Axis:   61 Text Interpretation:  Sinus rhythm Nonspecific T abnormalities, lateral leads Baseline wander in lead(s) II III aVF V1 V2 improved R wave progression Confirmed by Vanetta Mulders [phone]) on 11/02/2018 7:15:08 AM   Radiology Dg Chest 2 View  Result Date: 11/02/2018 CLINICAL DATA:  Shortness of breath. EXAM: CHEST - 2 VIEW COMPARISON:  Radiographs of September 14, 2018. FINDINGS: The heart size and mediastinal contours are within normal limits. No pneumothorax or pleural effusion is noted. Lingular opacity is noted concerning for pneumonia or atelectasis. The visualized skeletal structures are unremarkable. IMPRESSION: Left lingular opacity is noted concerning for pneumonia or atelectasis. Followup PA and lateral chest X-ray is recommended in 3-4 weeks following trial of antibiotic therapy to ensure resolution and exclude underlying malignancy. Electronically Signed   By: Lupita Raider, M.D.   On: 11/02/2018 07:51   Ct Chest Wo Contrast  Result Date: 11/02/2018 CLINICAL DATA:  Shortness of breath for 1 week. No relief with treatment. Oxygen saturation, 86%. EXAM: CT CHEST WITHOUT CONTRAST TECHNIQUE: Multidetector CT imaging of the chest was performed following the standard protocol without IV contrast. COMPARISON:  None. FINDINGS: Cardiovascular: The heart  size is normal. Coronary artery calcifications are identified in both the LAD and circumflex coronary arteries. The thoracic aorta is normal in caliber with no atherosclerotic change. The central pulmonary arteries are unremarkable. Mediastinum/Nodes: The thyroid and esophagus are normal. No effusions. No adenopathy. Lungs/Pleura: Central airways are normal. No pneumothorax. Bilateral patchy infiltrates are identified in both lungs, involving all lobes. There are large ground-glass components with smaller regions of denser consolidation.  Upper Abdomen: Previous cholecystectomy. Probable splenule adjacent to the spleen on series 2, image 144. No other abnormalities in the upper abdomen. Musculoskeletal: No chest wall mass or suspicious bone lesions identified. IMPRESSION: 1. Bilateral patchy primarily ground-glass opacities involving all lobes. There are smaller regions of denser consolidation. The possibilities for this CT appearance include numerous infectious and inflammatory etiologies. Multifocal pneumonia, especially an atypical pneumonia, is a possibility. Other non infectious possibilities include cryptogenic organizing pneumonia and acute eosinophilic pneumonia. Recommend clinical correlation. 2. Calcified atherosclerosis in the left coronary arteries. Electronically Signed   By: Gerome Sam III M.D   On: 11/02/2018 09:20    Procedures Procedures (including critical care time)  Medications Ordered in ED Medications  0.9 %  sodium chloride infusion ( Intravenous New Bag/Given 11/02/18 0823)  cefTRIAXone (ROCEPHIN) 1 g in sodium chloride 0.9 % 100 mL IVPB (1 g Intravenous New Bag/Given 11/02/18 0948)  albuterol (PROVENTIL) (2.5 MG/3ML) 0.083% nebulizer solution 5 mg (5 mg Nebulization Given 11/02/18 0721)  sodium chloride 0.9 % bolus 500 mL (0 mLs Intravenous Stopped 11/02/18 0916)  methylPREDNISolone sodium succinate (SOLU-MEDROL) 125 mg/2 mL injection 125 mg (125 mg Intravenous Given 11/02/18  0824)     Initial Impression / Assessment and Plan / ED Course  I have reviewed the triage vital signs and the nursing notes.  Pertinent labs & imaging results that were available during my care of the patient were reviewed by me and considered in my medical decision making (see chart for details).     Patient was symptoms suggestive of a flulike illness.  But symptoms have persisted patient had fever early on not as much fever now.  But having worse cough and feeling short of breath.  Regular chest x-ray raise some concerns about a mass or or pneumonia in the lingula area.  CT chest without because he has a dye allergy suggestive of bilateral pneumonia.  Patient has not been hospitalized recently.  Does have myasthenia gravis who has limitation on antibiotics.  But is able to take tetracyclines and cephalosporins are fine.  Patient's been on Augmentin but it has not helped.  We will have him stop the Augmentin and give a dose of Rocephin here 1 g and will continue a 7-day course of doxycycline.  Close follow-up with his primary care doctor.  Also recommend follow-up imaging chest x-ray or CT scan to make sure that the lung fields clear.  Patient will return for any new or worse symptoms.  Patient has a history of diabetes.  He is aware that the prednisone will make the blood sugars higher he will watch them carefully.  Patient did receive Solu-Medrol here.  And will take prednisone for the next 5 days.  Patient does have an albuterol inhaler and will need to continue to use that.  Patient received nebulizer treatment here with resolution of the wheezing.  Moving air better.  Not hypoxic.  On 2 L of oxygen.  Will take patient off of that and make sure his sats stay above 90%.  On 2 L he satting 98%.  Patient does not use oxygen at home.  Patient did have some hypoxia upon presentation with oxygen sat of 86%.  But now patient's breathing feels much better we will see how he does.  If he is not hypoxic  patient can be discharged home.    Final Clinical Impressions(s) / ED Diagnoses   Final diagnoses:  Community acquired pneumonia, unspecified laterality    ED Discharge Orders  Ordered    predniSONE (DELTASONE) 10 MG tablet  Daily     11/02/18 0947    doxycycline (VIBRAMYCIN) 100 MG capsule  2 times daily     11/02/18 0947           Vanetta Mulders, MD 11/02/18 0957  Addendum patient's oxygen sat staying 90 to 92% on room air.    Vanetta Mulders, MD 11/02/18 1000

## 2018-11-27 ENCOUNTER — Ambulatory Visit (INDEPENDENT_AMBULATORY_CARE_PROVIDER_SITE_OTHER): Payer: Managed Care, Other (non HMO) | Admitting: Pulmonary Disease

## 2018-11-27 ENCOUNTER — Encounter: Payer: Self-pay | Admitting: Pulmonary Disease

## 2018-11-27 VITALS — BP 110/66 | HR 88 | Ht 72.0 in | Wt 201.8 lb

## 2018-11-27 DIAGNOSIS — J189 Pneumonia, unspecified organism: Secondary | ICD-10-CM | POA: Diagnosis not present

## 2018-11-27 DIAGNOSIS — J939 Pneumothorax, unspecified: Secondary | ICD-10-CM

## 2018-11-27 NOTE — Progress Notes (Signed)
[name]    [id number]    [birth year]  Primary Care Physician:Fusco, Lyman Bishop, MD  Referring Physician: Elfredia Nevins, MD [address] [place], [place] [address]  Chief complaint:   Patient with a history of recent pneumonia, pneumothorax  HPI:  He continues to feel better Still has an occasional cough with expectoration History of myasthenia gravis Pneumonia about 17 years ago Myasthenia gravis, thymectomy about 16 years ago Started having symptoms on December 6, diagnosed with bronchitis, treated with a course of antibiotics With worsening symptoms was treated with another course of antibiotics Did receive foreshorten total of Rocephin IM-managed as an outpatient [place] of breath Presented to the hospital where he was found to have a pneumothorax Had a chest tube placed Chest tube was removed on the 30th Has continued to feel better since then  Has no underlying lung disease  Never smoker   Outpatient Encounter Medications as of 11/27/2018  Medication Sig  . azaTHIOprine (IMURAN) 50 MG tablet Take 150 mg by mouth every morning.   . Calcium Carbonate-Vitamin D (CALCIUM 600+D) 600-200 MG-UNIT TABS Take 2 tablets by mouth every morning.   [name] ibuprofen (IBU) 800 MG tablet Take 800 mg by mouth as needed for fever or headache.   . losartan (COZAAR) 100 MG tablet Take 100 mg by mouth daily.   . metFORMIN (GLUCOPHAGE-XR) 500 MG 24 hr tablet Take 1,000 mg by mouth 2 (two) times daily.   . Omega-3 Fatty Acids (FISH OIL) 1200 MG CAPS Take 2,400 mg by mouth every morning.   [name] omeprazole (PRILOSEC) 20 MG capsule TAKE 1 CAPSULE BY MOUTH TWICE DAILY  . predniSONE (DELTASONE) 10 MG tablet Take 4 tablets (40 mg total) by mouth daily.  . Saw Palmetto, Serenoa repens, (SAW PALMETTO PO) Take by mouth daily.  [name] SYNTHROID 137 MCG tablet Take 137 mcg by mouth daily before breakfast.   . tamsulosin (FLOMAX) 0.4 MG CAPS  capsule Take 0.4 mg by mouth at bedtime.   . vitamin C (ASCORBIC ACID) 500 MG tablet Take 1,000 mg by mouth every morning.   . vitamin E 400 UNIT capsule Take 800 Units by mouth every morning.   . [DISCONTINUED] doxycycline (VIBRAMYCIN) 100 MG capsule Take 1 capsule (100 mg total) by mouth 2 (two) times daily.   No facility-administered encounter medications on file as of 11/27/2018.     Allergies as of 11/27/2018 - Review Complete 11/27/2018  Allergen Reaction Noted  . Levofloxacin Anaphylaxis 03/01/2011  . Tequin [gatifloxacin] Anaphylaxis 06/07/2012  . Aminoglycosides Other (See Comments) 06/07/2012  . Avelox [moxifloxacin hcl in nacl] Other (See Comments) 06/07/2012  . Botox [onabotulinumtoxina] Other (See Comments) 06/07/2012  . Botulinum toxins Other (See Comments) 06/07/2012  . Calcium channel blockers Other (See Comments) 06/07/2012  . Ciprofloxacin Other (See Comments) 06/07/2012  . Contrast media [iodinated diagnostic agents] Other (See Comments) 02/07/2018  . Curare [tubocurarine] Other (See Comments) 06/07/2012  . Dysport [abobotulinumtoxina] Other (See Comments) 06/07/2012  . Erythromycin Other (See Comments) 06/07/2012  . Factive [gemifloxacin] Other (See Comments) 06/07/2012  . Floxin [ofloxacin] Other (See Comments) 06/07/2012  . Gentamycin [gentamicin] Other (See Comments) 06/07/2012  . Interferons Other (See Comments) 06/07/2012  . Kanamycin Other (See Comments) 06/07/2012  . Ketek [telithromycin] Other (See Comments) 06/07/2012  . Levaquin [levofloxacin in d5w] Other (See Comments) 06/07/2012  . Macrolides and ketolides Other (See Comments) 06/07/2012  . Magnesium-containing compounds Other (See  Comments) 04/01/2014  . Myobloc [rimabotulinumtoxinb] Other (See Comments) 06/07/2012  . Neomycin Other (See Comments) 06/07/2012  . Noroxin [norfloxacin] Other (See Comments) 06/07/2012  . Other Other (See Comments) 06/07/2012  . Penicillamine Other (See Comments)  06/07/2012  . Procainamide Other (See Comments) 06/07/2012  . Propranolol Other (See Comments) 06/07/2012  . Quinidine Other (See Comments) 06/07/2012  . Quinine derivatives Other (See Comments) 06/07/2012  . Streptomycin Other (See Comments) 06/07/2012  . Timolol Other (See Comments) 06/07/2012  . Tobramycin Other (See Comments) 06/07/2012  . Xeomin [incobotulinumtoxina] Other (See Comments) 06/07/2012  . Zithromax [azithromycin] Other (See Comments) 06/07/2012    Past Medical History:  Diagnosis Date  . Candida esophagitis (HCC) OCT 2014  . Deafness in right ear   . Diabetes mellitus without complication (HCC)   . Esophageal stricture SEP 2011   FOOD IMPACTION-->SAV DIL 16 MM  . GERD (gastroesophageal reflux disease)   . Hypertension   . Kidney stone   . Myasthenia gravis (HCC)   . Thyroid disease     Past Surgical History:  Procedure Laterality Date  . BIOPSY  02/14/2018   Procedure: BIOPSY;  Surgeon: West Bali, MD;  [facility];  Service: Endoscopy;;  gastric  . CHOLECYSTECTOMY    . COLONOSCOPY  2005   Dr. Lovell Sheehan  . COLONOSCOPY N/A 07/17/2014   Procedure: COLONOSCOPY;  Surgeon: West Bali, MD;  [facility];  Service: Endoscopy;  Laterality: N/A;  9:30-rescheduled 8/28 same time Soledad Gerlach to notify pt  . ESOPHAGOGASTRODUODENOSCOPY N/A 07/25/2013   Shallow ulcer in the cardia, mid esophageal web, stricture at GE junction  . ESOPHAGOGASTRODUODENOSCOPY N/A 02/14/2018   Procedure: ESOPHAGOGASTRODUODENOSCOPY (EGD);  Surgeon: West Bali, MD;  [facility];  Service: Endoscopy;  Laterality: N/A;  11:15am  . ESOPHAGOGASTRODUODENOSCOPY (EGD) WITH ESOPHAGEAL DILATION N/A 08/29/2013   Candida esophagitis, small hiatal hernia, moderate nonerosive gastritis, mid esophageal web  . HERNIA REPAIR     right inguinal  . KIDNEY STONE SURGERY    . NASAL SEPTUM SURGERY    . PALATE SURGERY        . SAVORY DILATION N/A 02/14/2018   Procedure:  SAVORY DILATION;  Surgeon: West Bali, MD;  Location[facility];  Service: Endoscopy;  Laterality: N/A;  . THYMECTOMY    . TONSILLECTOMY    . UPPER GASTROINTESTINAL ENDOSCOPY  SEP 2011   SAV DIL    Family History  Problem Relation Age of Onset  . Liver disease Father        age 28, deceased  . Colon cancer Neg Hx     Social History   Socioeconomic History  . Marital status: Married    Spouse name: Not on file  . Number of children: Not on file  . Years of education: Not on file  . Highest education level: Not on file  Occupational History  . Not on file  Social Needs  . Financial resource strain: Not on file  . Food insecurity:    Worry: Not on file    Inability: Not on file  . Transportation needs:    Medical: Not on file    Non-medical: Not on file  Tobacco Use  . Smoking status: Never Smoker  . Smokeless tobacco: Never Used  Substance and Sexual Activity  . Alcohol use: No  . Drug use: No  . Sexual activity: Yes    Partners: Female    Birth control/protection: None  Comment: spouse  Lifestyle  . Physical activity:    Days per week: Not on file    Minutes per session: Not on file  . Stress: Not on file  Relationships  . Social connections:    Talks on phone: Not on file    Gets together: Not on file    Attends religious service: Not on file    Active member of club or organization: Not on file    Attends meetings of clubs or organizations: Not on file    Relationship status: Not on file  . Intimate partner violence:    Fear of current or ex partner: Not on file    Emotionally abused: Not on file    Physically abused: Not on file    Forced sexual activity: Not on file  Other Topics Concern  . Not on file  Social History Narrative  . Not on file    Review of Systems  Constitutional: Negative.   HENT: Negative.   Eyes: Negative.   Respiratory: Positive for cough. Negative for shortness of breath and wheezing.   Cardiovascular: Negative.    Gastrointestinal: Negative.    Vitals:   11/27/18 1429  BP: 110/66  Pulse: 88  SpO2: 96%   Physical Exam  Constitutional: He appears well-developed and well-nourished.  HENT:  Head: Normocephalic and atraumatic.  Retrognathia  Eyes: Pupils are equal, round, and reactive to light. Conjunctivae and EOM are normal. Right eye exhibits no discharge. Left eye exhibits no discharge.  Neck: Normal range of motion. Neck supple. No tracheal deviation present. No thyromegaly present.  Cardiovascular: Normal rate and regular rhythm.  Pulmonary/Chest: Effort normal and breath sounds normal. No respiratory distress. He has no wheezes.  Abdominal: Soft. Bowel sounds are normal. He exhibits no distension.   Data Reviewed: CT from 1214 reviewed with the patient Chest x-ray results from [facility] reviewed  Assessment:  Recent pneumonia-resolving Recent pneumothorax-post management with chest tube Clinically continues to improve Myasthenia gravis  Plan/Recommendations:  Continue lines of care at the present time No indication for any antibiotic or any other treatment at the present time Obtain a chest x-ray in about 4 weeks to compare with previous, evaluate for complete resolution of his pneumonia  I will see him back in the office in about 3 months Encouraged to call if any significant concerns/questions  Virl Diamond MD [facility] 11/27/2018, 2:32 PM  CC: Elfredia Nevins, MD

## 2018-11-27 NOTE — Patient Instructions (Signed)
Recent pneumonia Recent pneumothorax managed with a chest tube  Proving symptoms  Repeat chest x-ray in about a month to evaluate for resolution of pneumonia  Continue other lines of care No significant limitations with respect to activities, usually 1 to 2 weeks of no heavy manual lifting  I will see back in the office in about 3 months Call with any significant concerns

## 2018-12-27 ENCOUNTER — Ambulatory Visit (INDEPENDENT_AMBULATORY_CARE_PROVIDER_SITE_OTHER)
Admission: RE | Admit: 2018-12-27 | Discharge: 2018-12-27 | Disposition: A | Discharge status: Home / Self Care | Payer: Managed Care, Other (non HMO) | Source: Ambulatory Visit | Attending: Pulmonary Disease | Admitting: Pulmonary Disease

## 2018-12-27 DIAGNOSIS — J189 Pneumonia, unspecified organism: Secondary | ICD-10-CM | POA: Diagnosis not present

## 2018-12-27 DIAGNOSIS — J939 Pneumothorax, unspecified: Secondary | ICD-10-CM | POA: Diagnosis not present

## 2019-01-22 ENCOUNTER — Ambulatory Visit (INDEPENDENT_AMBULATORY_CARE_PROVIDER_SITE_OTHER): Payer: Managed Care, Other (non HMO) | Admitting: Urology

## 2019-01-22 DIAGNOSIS — N3001 Acute cystitis with hematuria: Secondary | ICD-10-CM

## 2019-01-22 DIAGNOSIS — N401 Enlarged prostate with lower urinary tract symptoms: Secondary | ICD-10-CM

## 2019-01-22 DIAGNOSIS — N5201 Erectile dysfunction due to arterial insufficiency: Secondary | ICD-10-CM

## 2019-01-29 ENCOUNTER — Encounter: Payer: Self-pay | Admitting: Gastroenterology

## 2019-03-12 ENCOUNTER — Ambulatory Visit: Payer: Managed Care, Other (non HMO) | Admitting: Pulmonary Disease

## 2019-06-30 ENCOUNTER — Telehealth: Payer: Self-pay | Admitting: *Deleted

## 2019-06-30 MED ORDER — OMEPRAZOLE 20 MG PO CPDR: 20.0000 mg | DELAYED_RELEASE_CAPSULE | Freq: Two times a day (BID) | ORAL | 3 refills | Status: DC

## 2019-06-30 NOTE — Addendum Note (Signed)
Addended by: Annitta Needs on: 06/30/2019 04:41 PM   Modules accepted: Orders

## 2019-06-30 NOTE — Telephone Encounter (Signed)
Forwarding to refill box.  

## 2019-06-30 NOTE — Telephone Encounter (Signed)
Done

## 2019-06-30 NOTE — Telephone Encounter (Signed)
Pt wants to see if we can send a new RX for 90 day supply of Omeprazole 20 mg twice a day to [facility].  Pt says Christella Scheuermann would not transfer the RX over.  [phone]

## 2019-08-06 ENCOUNTER — Ambulatory Visit (INDEPENDENT_AMBULATORY_CARE_PROVIDER_SITE_OTHER): Payer: Managed Care, Other (non HMO) | Admitting: Urology

## 2019-08-06 DIAGNOSIS — N401 Enlarged prostate with lower urinary tract symptoms: Secondary | ICD-10-CM | POA: Diagnosis not present

## 2019-08-06 DIAGNOSIS — N5201 Erectile dysfunction due to arterial insufficiency: Secondary | ICD-10-CM | POA: Diagnosis not present

## 2019-08-13 ENCOUNTER — Ambulatory Visit (INDEPENDENT_AMBULATORY_CARE_PROVIDER_SITE_OTHER): Payer: Managed Care, Other (non HMO) | Admitting: Urology

## 2019-08-13 ENCOUNTER — Other Ambulatory Visit (HOSPITAL_COMMUNITY)
Admission: RE | Admit: 2019-08-13 | Discharge: 2019-08-13 | Disposition: A | Discharge status: Home / Self Care | Payer: Managed Care, Other (non HMO) | Source: Ambulatory Visit | Attending: Urology | Admitting: Urology

## 2019-08-13 DIAGNOSIS — N3001 Acute cystitis with hematuria: Secondary | ICD-10-CM

## 2019-08-15 LAB — URINE CULTURE: Culture: 70000 — AB

## 2020-01-27 ENCOUNTER — Other Ambulatory Visit: Payer: Self-pay | Admitting: Urology

## 2020-03-02 ENCOUNTER — Other Ambulatory Visit: Payer: Self-pay | Admitting: Gastroenterology

## 2020-03-22 ENCOUNTER — Ambulatory Visit (INDEPENDENT_AMBULATORY_CARE_PROVIDER_SITE_OTHER): Payer: Managed Care, Other (non HMO) | Admitting: Cardiovascular Disease

## 2020-03-22 ENCOUNTER — Other Ambulatory Visit: Payer: Self-pay

## 2020-03-22 ENCOUNTER — Encounter (HOSPITAL_COMMUNITY): Payer: Self-pay | Admitting: Emergency Medicine

## 2020-03-22 ENCOUNTER — Emergency Department (HOSPITAL_COMMUNITY)
Admission: EM | Admit: 2020-03-22 | Discharge: 2020-03-22 | Disposition: A | Discharge status: Home / Self Care | Payer: Managed Care, Other (non HMO) | Attending: Emergency Medicine | Admitting: Emergency Medicine

## 2020-03-22 ENCOUNTER — Encounter: Payer: Self-pay | Admitting: Cardiovascular Disease

## 2020-03-22 VITALS — BP 156/78 | HR 61 | Ht 73.0 in | Wt 208.0 lb

## 2020-03-22 DIAGNOSIS — Z7189 Other specified counseling: Secondary | ICD-10-CM | POA: Diagnosis not present

## 2020-03-22 DIAGNOSIS — Z7984 Long term (current) use of oral hypoglycemic drugs: Secondary | ICD-10-CM | POA: Diagnosis not present

## 2020-03-22 DIAGNOSIS — G7000 Myasthenia gravis without (acute) exacerbation: Secondary | ICD-10-CM

## 2020-03-22 DIAGNOSIS — Z79899 Other long term (current) drug therapy: Secondary | ICD-10-CM | POA: Diagnosis not present

## 2020-03-22 DIAGNOSIS — I4891 Unspecified atrial fibrillation: Secondary | ICD-10-CM | POA: Diagnosis not present

## 2020-03-22 DIAGNOSIS — I10 Essential (primary) hypertension: Secondary | ICD-10-CM

## 2020-03-22 DIAGNOSIS — E119 Type 2 diabetes mellitus without complications: Secondary | ICD-10-CM | POA: Insufficient documentation

## 2020-03-22 DIAGNOSIS — I480 Paroxysmal atrial fibrillation: Secondary | ICD-10-CM | POA: Diagnosis not present

## 2020-03-22 DIAGNOSIS — R002 Palpitations: Secondary | ICD-10-CM | POA: Diagnosis present

## 2020-03-22 MED ORDER — APIXABAN 5 MG PO TABS: 5.0000 mg | ORAL_TABLET | Freq: Two times a day (BID) | ORAL | 11 refills | Status: DC

## 2020-03-22 MED ORDER — ATENOLOL 25 MG PO TABS
25.0000 mg | ORAL_TABLET | Freq: Once | ORAL | Status: AC
  Administered 2020-03-22: 25 mg via ORAL
  Filled 2020-03-22: qty 1

## 2020-03-22 MED ORDER — ATENOLOL 25 MG PO TABS: 25.0000 mg | ORAL_TABLET | Freq: Every day | ORAL | 3 refills | Status: DC

## 2020-03-22 NOTE — Progress Notes (Addendum)
CARDIOLOGY CONSULT NOTE  Patient ID: [name] MRN: [id number] DOB/AGE: 1951-08-14 69 y.o.  Admit date: (Not on file) Primary Physician: Elfredia Nevins, MD  Reason for Consultation: Chest pain and palpitations  HPI: [name] is a 69 y.o. male who is being seen today for the evaluation of chest pain and palpitations at the request of Assunta Found, MD.  Past medical history includes myasthenia gravis, hypertension, type 2 diabetes mellitus, and hypothyroidism.  I also reviewed records from his PCP.  I personally reviewed the ECG performed on 02/26/2020 which demonstrated rapid atrial fibrillation.   I reviewed labs dated 02/26/2020: White blood cell 7.4, TSH 1.81, triglycerides 65, sodium 139, potassium 4.4, platelets 214, LDL 98, hemoglobin 13.1, creatinine 1.17, total cholesterol 153, BUN 18.  He is here with his wife.  Their daughter, Misty Stanley, works as an Environmental health practitioner at [employer].  In December 2019 he was at a convention in [place] with roughly 100,000 people, 25,000 of whom had come from [place].  He then developed a respiratory infection deemed viral in etiology and then developed pneumonia.  He was sick for about 3 weeks.  I reviewed the chest CT which described multifocal pneumonia with atypical pneumonia is a possibility.  Both the patient and his wife are fairly convinced he had COVID-19.  Over the last 6 to 8 weeks he has had episodic palpitations.  He denies exertional chest pain.  He has episodic shortness of breath.  He denies leg swelling, orthopnea, and paroxysmal nocturnal dyspnea.  He does get dizzy if he bends over.  He had his thymus removed several years ago.  I reviewed his medication list and medication intolerances and because of his myasthenia gravis, he has an extensive list of medications which could potentially provoke a myasthenic crisis which includes calcium channel blockers and propranolol.  I personally  reviewed the ECG performed in our office today which demonstrates rapid atrial fibrillation 133 bpm.  A second ECG was performed which demonstrated rapid atrial fibrillation, 121 bpm.     Allergies  Allergen Reactions  . Levofloxacin Anaphylaxis  . Tequin [Gatifloxacin] Anaphylaxis  . Aminoglycosides Other (See Comments)    REACTION: Myasthenia Gravis (MG) Medication Alert  . Avelox [Moxifloxacin Hcl In Nacl] Other (See Comments)    REACTION: FLUOROQUINOLONE ANTIBIOTICS: Myasthenia Gravis (MG) Medication Alert  . Botox [Onabotulinumtoxina] Other (See Comments)    REACTION: Myasthenia Gravis (MG) Medication Alert  . Botulinum Toxins Other (See Comments)    REACTION: Myasthenia Gravis (MG) Medication Alert  . Calcium Channel Blockers Other (See Comments)    (BLOOD PRESSURE MEDICATION) REACTION: Myasthenia Gravis (MG) Medication Alert  . Ciprofloxacin Other (See Comments)    REACTION: FLUOROQUINOLONE ANTIBIOTICS: Myasthenia Gravis (MG) Medication Alert  . Contrast Media [Iodinated Diagnostic Agents] Other (See Comments)    MD told pt cannot have  . Curare [Tubocurarine] Other (See Comments)    (Usually only used during surgery)  REACTION: Myasthenia Gravis (MG) Medication Alert  . Dysport [Abobotulinumtoxina] Other (See Comments)    REACTION: Myasthenia Gravis (MG) Medication Alert  . Erythromycin Other (See Comments)    REACTION: Myasthenia Gravis (MG) Medication Alert  . Factive [Gemifloxacin] Other (See Comments)    REACTION: FLUOROQUINOLONE ANTIBIOTICS: Myasthenia Gravis (MG) Medication Alert  . Floxin [Ofloxacin] Other (See Comments)    REACTION: FLUOROQUINOLONE ANTIBIOTICS: Myasthenia Gravis (MG) Medication Alert  . Gentamycin [Gentamicin] Other (See Comments)    REACTION:Myasthenia Gravis (MG) Medication Alert  . Interferons  Other (See Comments)    REACTION: Myasthenia Gravis (MG) Medication Alert  . Kanamycin Other (See Comments)    REACTION: Myasthenia Gravis (MG)  Medication Alert  . Ketek [Telithromycin] Other (See Comments)    REACTION: Myasthenia Gravis (MG) Medication Alert  . Levaquin [Levofloxacin In D5w] Other (See Comments)    REACTION: FLUOROQUINOLONE ANTIBIOTICS: Myasthenia Gravis (MG) Medication Alert  . Macrolides And Ketolides Other (See Comments)    REACTION: Myasthenia Gravis (MG) Medication Alert  . Magnesium-Containing Compounds Other (See Comments)    Unknown  . Myobloc [Rimabotulinumtoxinb] Other (See Comments)    REACTION: Myasthenia Gravis (MG) Medication Alert  . Neomycin Other (See Comments)    REACTION: Myasthenia Gravis (MG) Medication Alert  . Noroxin [Norfloxacin] Other (See Comments)    REACTION: FLUOROQUINOLONE ANTIBIOTICS: Myasthenia Gravis (MG) Medication Alert  . Other Other (See Comments)    MAGNESIUM SALTS: Milk of Magnesia, some antacids (Maalox, Mylanta) REACTION: Myasthenia Gravis (MG) Medication Alerta  . Penicillamine Other (See Comments)    D-PENICILLAMINE *DO NOT CONFUSE WITH PENICILLIN*  REACTION: Myasthenia Gravis (MG) Medication Alert  . Procainamide Other (See Comments)    REACTION: Myasthenia Gravis (MG) Medication Alert  . Propranolol Other (See Comments)    REACTION: Myasthenia Gravis (MG) Medication Alert  . Quinidine Other (See Comments)    REACTION: Myasthenia Gravis (MG) Medication Alert  . Quinine Derivatives Other (See Comments)    REACTION: Myasthenia Gravis (MG) Medication Alert  . Streptomycin Other (See Comments)    REACTION:  Myasthenia Gravis (MG) Medication Alert  . Timolol Other (See Comments)    REACTION: Myasthenia Gravis (MG) Medication Alert  . Tobramycin Other (See Comments)    REACTION: Myasthenia Gravis (MG) Medication Alert  . Xeomin [Incobotulinumtoxina] Other (See Comments)    REACTION: Myasthenia Gravis (MG) Medication Alert  . Zithromax [Azithromycin] Other (See Comments)    REACTION: Myasthenia Gravis (MG) Medication Alert    Current Outpatient Medications    Medication Sig Dispense Refill  . acyclovir (ZOVIRAX) 400 MG tablet Take 400 mg by mouth 2 (two) times daily.    [name] azaTHIOprine (IMURAN) 50 MG tablet Take 150 mg by mouth 3 (three) times daily.     . Calcium Carbonate-Vitamin D (CALCIUM 600+D) 600-200 MG-UNIT TABS Take 2 tablets by mouth every morning.     [name] ibuprofen (IBU) 800 MG tablet Take 800 mg by mouth as needed for fever or headache.     . losartan (COZAAR) 100 MG tablet Take 100 mg by mouth daily.     . metFORMIN (GLUCOPHAGE-XR) 500 MG 24 hr tablet Take 1,000 mg by mouth 2 (two) times daily.     . Omega-3 Fatty Acids (FISH OIL) 1200 MG CAPS Take 2,400 mg by mouth every morning.     [name] omeprazole (PRILOSEC) 20 MG capsule TAKE (1) CAPSULE BY MOUTH TWICE DAILY. 180 capsule 3  . Saw Palmetto, Serenoa repens, (SAW PALMETTO PO) Take by mouth daily.    [name] SYNTHROID 137 MCG tablet Take 137 mcg by mouth daily before breakfast.     . tamsulosin (FLOMAX) 0.4 MG CAPS capsule Take 0.4 mg by mouth at bedtime.     . vitamin C (ASCORBIC ACID) 500 MG tablet Take 1,000 mg by mouth every morning.     . vitamin E 400 UNIT capsule Take 800 Units by mouth every morning.      No current facility-administered medications for this visit.    Past Medical History:  Diagnosis Date  . Candida esophagitis (HCC)  OCT 2014  . Deafness in right ear   . Diabetes mellitus without complication ([place])   . Esophageal stricture SEP 2011   FOOD IMPACTION-->SAV DIL 16 MM  . GERD (gastroesophageal reflux disease)   . Hypertension   . Kidney stone   . Myasthenia gravis ([place])   . Thyroid disease     Past Surgical History:  Procedure Laterality Date  . BIOPSY  02/14/2018   Procedure: BIOPSY;  Surgeon: Danie Binder, MD;  Location: [facility];  Service: Endoscopy;;  gastric  . CHOLECYSTECTOMY    . COLONOSCOPY  2005   Dr. Arnoldo Morale  . COLONOSCOPY N/A 07/17/2014   Procedure: COLONOSCOPY;  Surgeon: Danie Binder, MD;  Location: [facility];  Service: Endoscopy;   Laterality: N/A;  9:30-rescheduled 8/28 same time Darius Bump to notify pt  . ESOPHAGOGASTRODUODENOSCOPY N/A 07/25/2013   Shallow ulcer in the cardia, mid esophageal web, stricture at GE junction  . ESOPHAGOGASTRODUODENOSCOPY N/A 02/14/2018   Procedure: ESOPHAGOGASTRODUODENOSCOPY (EGD);  Surgeon: Danie Binder, MD;  Location: [facility];  Service: Endoscopy;  Laterality: N/A;  11:15am  . ESOPHAGOGASTRODUODENOSCOPY (EGD) WITH ESOPHAGEAL DILATION N/A 08/29/2013   Candida esophagitis, small hiatal hernia, moderate nonerosive gastritis, mid esophageal web  . HERNIA REPAIR     right inguinal  . KIDNEY STONE SURGERY    . NASAL SEPTUM SURGERY    . PALATE SURGERY        . SAVORY DILATION N/A 02/14/2018   Procedure: SAVORY DILATION;  Surgeon: Danie Binder, MD;  Location: [facility];  Service: Endoscopy;  Laterality: N/A;  . THYMECTOMY    . TONSILLECTOMY    . UPPER GASTROINTESTINAL ENDOSCOPY  SEP 2011   SAV DIL    Social History   Socioeconomic History  . Marital status: Married    Spouse name: Not on file  . Number of children: Not on file  . Years of education: Not on file  . Highest education level: Not on file  Occupational History  . Not on file  Tobacco Use  . Smoking status: Never Smoker  . Smokeless tobacco: Never Used  Substance and Sexual Activity  . Alcohol use: No  . Drug use: No  . Sexual activity: Yes    Partners: Female    Birth control/protection: None    Comment: spouse  Other Topics Concern  . Not on file  Social History Narrative  . Not on file   Social Determinants of Health   Financial Resource Strain:   . Difficulty of Paying Living Expenses:   Food Insecurity:   . Worried About Charity fundraiser in the Last Year:   . Arboriculturist in the Last Year:   Transportation Needs:   . Film/video editor (Medical):   [name] Lack of Transportation (Non-Medical):   Physical Activity:   . Days of Exercise per Week:   . Minutes of Exercise per Session:    Stress:   . Feeling of Stress :   Social Connections:   . Frequency of Communication with Friends and Family:   . Frequency of Social Gatherings with Friends and Family:   . Attends Religious Services:   . Active Member of Clubs or Organizations:   . Attends Archivist Meetings:   [name] Marital Status:   Intimate Partner Violence:   . Fear of Current or Ex-Partner:   . Emotionally Abused:   [name] Physically Abused:   . Sexually Abused:  No family history of premature CAD in 1st degree relatives.  Current Meds  Medication Sig  . acyclovir (ZOVIRAX) 400 MG tablet Take 400 mg by mouth 2 (two) times daily.  [name] azaTHIOprine (IMURAN) 50 MG tablet Take 150 mg by mouth 3 (three) times daily.   . Calcium Carbonate-Vitamin D (CALCIUM 600+D) 600-200 MG-UNIT TABS Take 2 tablets by mouth every morning.   [name] ibuprofen (IBU) 800 MG tablet Take 800 mg by mouth as needed for fever or headache.   . losartan (COZAAR) 100 MG tablet Take 100 mg by mouth daily.   . metFORMIN (GLUCOPHAGE-XR) 500 MG 24 hr tablet Take 1,000 mg by mouth 2 (two) times daily.   . Omega-3 Fatty Acids (FISH OIL) 1200 MG CAPS Take 2,400 mg by mouth every morning.   [name] omeprazole (PRILOSEC) 20 MG capsule TAKE (1) CAPSULE BY MOUTH TWICE DAILY.  . Saw Palmetto, Serenoa repens, (SAW PALMETTO PO) Take by mouth daily.  [name] SYNTHROID 137 MCG tablet Take 137 mcg by mouth daily before breakfast.   . tamsulosin (FLOMAX) 0.4 MG CAPS capsule Take 0.4 mg by mouth at bedtime.   . vitamin C (ASCORBIC ACID) 500 MG tablet Take 1,000 mg by mouth every morning.   . vitamin E 400 UNIT capsule Take 800 Units by mouth every morning.       Review of systems complete and found to be negative unless listed above in HPI    Physical exam Blood pressure (!) 156/78, pulse 61, height 6\' 1"  (1.854 m), weight 208 lb (94.3 kg), SpO2 97 %. General: NAD Neck: No JVD, no thyromegaly or thyroid nodule.  Lungs: Clear to auscultation bilaterally with  normal respiratory effort. CV: Nondisplaced PMI.  Tachycardic, irregular rhythm, normal S1/S2, no S3, no murmur.  No peripheral edema.  No carotid bruit.    Abdomen: Soft, nontender, no distention.  Skin: Intact without lesions or rashes.  Neurologic: Alert and oriented x 3.  Psych: Normal affect. Extremities: No clubbing or cyanosis.  HEENT: Normal.   ECG: Most recent ECG reviewed.   Labs: Lab Results  Component Value Date/Time   K 4.1 11/02/2018 08:26 AM   BUN 17 11/02/2018 08:26 AM   CREATININE 1.05 11/02/2018 08:26 AM   ALT 21 09/14/2018 01:04 PM   HGB 12.9 (L) 11/02/2018 08:26 AM     Lipids: No results found for: LDLCALC, LDLDIRECT, CHOL, TRIG, HDL      ASSESSMENT AND PLAN:   1.  Rapid atrial fibrillation: He is not very symptomatic and only describes episodic palpitations and shortness of breath.  Given his extensive list of medications which could potentially provoke a myasthenic crisis, one needs to be careful when selecting an AV nodal blocking agent.  Calcium channel blockers and propranolol are listed as medications which could potentially provoke a myasthenic crisis.  I contacted our inpatient pharmacy department at [facility] to see if they had any recommendations or any reservations with respect to metoprolol or amiodarone. Amiodarone appears to be safe as would be atenolol.  After discussing with the inpatient pharmacy department further, I would recommend starting atenolol 25 mg daily and Eliquis 5 mg twice daily.  I will obtain an echocardiogram once his heart rate is under adequate control. I will have him return in 1 week for nurse visit to check vitals and obtain a follow-up ECG. I have discussed this with the patient's wife and their daughter, Aggie Cosier. If heart rates become difficult to control and left atrial size is not prohibitive,  I would consider a direct-current cardioversion after several weeks of anticoagulation.  2.  Hypertension: BP is elevated. This will  need further monitoring as I am starting atenolol.  3.  Myasthenia gravis: Symptoms appear to be stable.  His care coordinator at [facility] is [employer] at office number [phone].    Disposition: I will have him return in 1 week for nurse visit to check vitals and obtain a follow-up ECG.  Follow-up with me in the office in 6 weeks.   Signed: Prentice Docker, M.D., F.A.C.C.  03/22/2020, 1:27 PM

## 2020-03-22 NOTE — Addendum Note (Signed)
Addended by: Marlyn Corporal A on: 03/22/2020 03:18 PM   Modules accepted: Orders

## 2020-03-22 NOTE — ED Triage Notes (Signed)
Pt reports palpitations intermittently for last several months. Pt reports was at cardiology office for routine appointment and was sent over for evaluation related to afib with RVR. Pt denies pain. nad noted.

## 2020-03-22 NOTE — Patient Instructions (Addendum)
Medication Instructions: START Eliquis 5 mg twice a day  Start Atenolol 25 mg daily  AVOID NSAIDS (Motrin, Aleve,Advil) use tylenol Labwork: None today  Procedures/Testing: None today  Follow-Up: In 1 week, nurse apt for VS and EKG  Any Additional Special Instructions Will Be Listed Below (If Applicable).   I have given you a FREE TRIAL card for 30 days  If you need a refill on your cardiac medications before your next appointment, please call your pharmacy.        Thank you for choosing [facility] !

## 2020-03-22 NOTE — Discharge Instructions (Signed)
Dr. Purvis Sheffield is sending your atenolol and your blood thinner prescriptions to your pharmacy and you can start those medicines today.  Follow-up with him when he wants to see you back.  If you do not know when that is just call their office this week

## 2020-03-22 NOTE — ED Provider Notes (Signed)
[facility] EMERGENCY DEPARTMENT Provider Note   CSN: [id number] Arrival date & time: 03/22/20  1418     History Chief Complaint  Patient presents with  . Palpitations    [name] is a 69 y.o. male.  Patient complains of palpitations for a number days.  The patient saw his cardiologist today and initially the cardiologist recommend admission to the hospital on treatment with atenolol and Eliquis.  After the patient was seen in the emergency department the patient's cardiologist decided the patient could be discharged home and treated as an outpatient with follow-up  The history is provided by the patient. No language interpreter was used.  Palpitations Palpitations quality:  Regular Onset quality:  Sudden Timing:  Constant Progression:  Unchanged Chronicity:  New Context: not anxiety   Relieved by:  Nothing Worsened by:  Nothing Ineffective treatments:  None tried Associated symptoms: no back pain, no chest pain and no cough   Risk factors: no diabetes mellitus        Past Medical History:  Diagnosis Date  . Candida esophagitis (HCC) OCT 2014  . Deafness in right ear   . Diabetes mellitus without complication (HCC)   . Esophageal stricture SEP 2011   FOOD IMPACTION-->SAV DIL 16 MM  . GERD (gastroesophageal reflux disease)   . Hypertension   . Kidney stone   . Myasthenia gravis (HCC)   . Thyroid disease     Patient Active Problem List   Diagnosis Date Noted  . Dysphagia   . Plantar fasciitis 09/04/2017  . Cough 03/03/2017  . Bacterial pharyngitis 03/03/2017  . Colon cancer screening 06/03/2014  . Diverticulitis large intestine 04/01/2014  . Diarrhea 04/01/2014  . Candida esophagitis (HCC) 11/26/2013  . UTI (lower urinary tract infection) 06/07/2012  . Myasthenia gravis (HCC)   . Hypertension   . Esophageal stricture 07/21/2010    Past Surgical History:  Procedure Laterality Date  . BIOPSY  02/14/2018   Procedure: BIOPSY;  Surgeon: West Bali,  MD;  [facility];  Service: Endoscopy;;  gastric  . CHOLECYSTECTOMY    . COLONOSCOPY  2005   Dr. Lovell Sheehan  . COLONOSCOPY N/A 07/17/2014   Procedure: COLONOSCOPY;  Surgeon: West Bali, MD;  [facility];  Service: Endoscopy;  Laterality: N/A;  9:30-rescheduled 8/28 same time Soledad Gerlach to notify pt  . ESOPHAGOGASTRODUODENOSCOPY N/A 07/25/2013   Shallow ulcer in the cardia, mid esophageal web, stricture at GE junction  . ESOPHAGOGASTRODUODENOSCOPY N/A 02/14/2018   Procedure: ESOPHAGOGASTRODUODENOSCOPY (EGD);  Surgeon: West Bali, MD;  [facility];  Service: Endoscopy;  Laterality: N/A;  11:15am  . ESOPHAGOGASTRODUODENOSCOPY (EGD) WITH ESOPHAGEAL DILATION N/A 08/29/2013   Candida esophagitis, small hiatal hernia, moderate nonerosive gastritis, mid esophageal web  . HERNIA REPAIR     right inguinal  . KIDNEY STONE SURGERY    . NASAL SEPTUM SURGERY    . PALATE SURGERY        . SAVORY DILATION N/A 02/14/2018   Procedure: SAVORY DILATION;  Surgeon: West Bali, MD;  [facility];  Service: Endoscopy;  Laterality: N/A;  . THYMECTOMY    . TONSILLECTOMY    . UPPER GASTROINTESTINAL ENDOSCOPY  SEP 2011   SAV DIL       Family History  Problem Relation Age of Onset  . Liver disease Father        age 43, deceased  . Colon cancer Neg Hx     Social History  Tobacco Use  . Smoking status: Never Smoker  . Smokeless tobacco: Never Used  Substance Use Topics  . Alcohol use: No  . Drug use: No    Home Medications Prior to Admission medications   Medication Sig Start Date End Date Taking? Authorizing Provider  acyclovir (ZOVIRAX) 400 MG tablet Take 400 mg by mouth 2 (two) times daily.    [provider]  azaTHIOprine (IMURAN) 50 MG tablet Take 150 mg by mouth 3 (three) times daily.     [provider]  Calcium Carbonate-Vitamin D (CALCIUM 600+D) 600-200 MG-UNIT TABS Take 2 tablets by mouth every morning.     [provider]  ibuprofen (IBU) 800 MG tablet Take 800 mg by mouth as needed for fever or headache.  05/15/18   [provider]  losartan (COZAAR) 100 MG tablet Take 100 mg by mouth daily.  07/05/17   [provider]  metFORMIN (GLUCOPHAGE-XR) 500 MG 24 hr tablet Take 1,000 mg by mouth 2 (two) times daily.  06/30/13   [provider]  Omega-3 Fatty Acids (FISH OIL) 1200 MG CAPS Take 2,400 mg by mouth every morning.     [provider]  omeprazole (PRILOSEC) 20 MG capsule TAKE (1) CAPSULE BY MOUTH TWICE DAILY. 03/03/20   Mahala Menghini, PA-C  Saw Palmetto, Serenoa repens, (SAW PALMETTO PO) Take by mouth daily.    [provider]  SYNTHROID 137 MCG tablet Take 137 mcg by mouth daily before breakfast.  07/03/13   [provider]  tamsulosin (FLOMAX) 0.4 MG CAPS capsule Take 0.4 mg by mouth at bedtime.     [provider]  vitamin C (ASCORBIC ACID) 500 MG tablet Take 1,000 mg by mouth every morning.     [provider]  vitamin E 400 UNIT capsule Take 800 Units by mouth every morning.     [provider]    Allergies    Levofloxacin, Tequin [gatifloxacin], Aminoglycosides, Avelox [moxifloxacin hcl in nacl], Botox [onabotulinumtoxina], Botulinum toxins, Calcium channel blockers, Ciprofloxacin, Contrast media [iodinated diagnostic agents], Curare [tubocurarine], Dysport [abobotulinumtoxina], Erythromycin, Factive [gemifloxacin], Floxin [ofloxacin], Gentamycin [gentamicin], Interferons, Kanamycin, Ketek [telithromycin], Levaquin [levofloxacin in d5w], Limonene, Macrolides and ketolides, Magnesium-containing compounds, Myobloc [rimabotulinumtoxinb], Neomycin, Noroxin [norfloxacin], Other, Penicillamine, Procainamide, Propranolol, Quinidine, Quinine derivatives, Streptomycin, Timolol, Tobramycin, Xeomin [incobotulinumtoxina], and Zithromax [azithromycin]  Review of Systems   Review of Systems  Constitutional: Negative for appetite  change and fatigue.  HENT: Negative for congestion, ear discharge and sinus pressure.   Eyes: Negative for discharge.  Respiratory: Negative for cough.   Cardiovascular: Positive for palpitations. Negative for chest pain.  Gastrointestinal: Negative for abdominal pain and diarrhea.  Genitourinary: Negative for frequency and hematuria.  Musculoskeletal: Negative for back pain.  Skin: Negative for rash.  Neurological: Negative for seizures and headaches.  Psychiatric/Behavioral: Negative for hallucinations.    Physical Exam Updated Vital Signs BP 125/70   Pulse (!) 105   Temp 97.9 F (36.6 C) (Oral)   Resp 12   Ht 6\' 1"  (1.854 m)   Wt 94 kg   SpO2 98%   BMI 27.34 kg/m   Physical Exam Vitals and nursing note reviewed.  Constitutional:      Appearance: He is well-developed.  HENT:     Head: Normocephalic.     Nose: Nose normal.  Eyes:     General: No scleral icterus.    Conjunctiva/sclera: Conjunctivae normal.  Neck:     Thyroid: No thyromegaly.  Cardiovascular:  Rate and Rhythm: Tachycardia present. Rhythm irregular.     Heart sounds: No murmur. No friction rub. No gallop.   Pulmonary:     Breath sounds: No stridor. No wheezing or rales.  Chest:     Chest wall: No tenderness.  Abdominal:     General: There is no distension.     Tenderness: There is no abdominal tenderness. There is no rebound.  Musculoskeletal:        General: Normal range of motion.     Cervical back: Neck supple.  Lymphadenopathy:     Cervical: No cervical adenopathy.  Skin:    Findings: No erythema or rash.  Neurological:     Mental Status: He is alert and oriented to person, place, and time.     Motor: No abnormal muscle tone.     Coordination: Coordination normal.  Psychiatric:        Behavior: Behavior normal.     ED Results / Procedures / Treatments   Labs (all labs ordered are listed, but only abnormal results are displayed) Labs Reviewed - No data to  display  EKG None  Radiology No results found.  Procedures Procedures (including critical care time)  Medications Ordered in ED Medications  atenolol (TENORMIN) tablet 25 mg (25 mg Oral Given 03/22/20 1425)    ED Course  I have reviewed the triage vital signs and the nursing notes.  Pertinent labs & imaging results that were available during my care of the patient were reviewed by me and considered in my medical decision making (see chart for details).    MDM Rules/Calculators/A&P                     Patient with new onset atrial fib.  His cardiologist Dr. Purvis Sheffield will send him atenolol and Eliquis to his pharmacy and he will follow-up as an outpatient    This patient presents to the ED for concern of palpitations, this involves an extensive number of treatment options, and is a complaint that carries with it a high risk of complications and morbidity.  The differential diagnosis includes atrial fib   Lab Tests:   Medicines ordered:   I ordered medication atenolol for atrial fib  Imaging Studies ordered:   Additional history obtained:   Additional history obtained from his cardiologist  Previous records obtained and reviewed   Consultations Obtained:   I consulted his cardiologist and discussed lab and imaging findings  Reevaluation:  After the interventions stated above, I reevaluated the patient and found no change  Critical Interventions:  .   Final Clinical Impression(s) / ED Diagnoses Final diagnoses:  Atrial fibrillation with rapid ventricular response (HCC)  Paroxysmal atrial fibrillation [facility])    Rx / DC Orders ED Discharge Orders    None       Bethann Berkshire, MD 03/23/20 1622

## 2020-03-29 ENCOUNTER — Ambulatory Visit (INDEPENDENT_AMBULATORY_CARE_PROVIDER_SITE_OTHER): Payer: Managed Care, Other (non HMO) | Admitting: *Deleted

## 2020-03-29 ENCOUNTER — Other Ambulatory Visit: Payer: Self-pay

## 2020-03-29 DIAGNOSIS — I4891 Unspecified atrial fibrillation: Secondary | ICD-10-CM | POA: Diagnosis not present

## 2020-03-29 MED ORDER — LOSARTAN POTASSIUM 50 MG PO TABS: 50.0000 mg | ORAL_TABLET | Freq: Every day | ORAL | 1 refills | Status: DC

## 2020-03-29 MED ORDER — ATENOLOL 50 MG PO TABS: 50.0000 mg | ORAL_TABLET | Freq: Two times a day (BID) | ORAL | 1 refills | Status: DC

## 2020-03-29 NOTE — Addendum Note (Signed)
Addended by: Burman Nieves T on: 03/29/2020 04:42 PM   Modules accepted: Orders

## 2020-03-29 NOTE — Progress Notes (Signed)
Pt here for EKG and BP per 5/3 office visit - has been taking atenolol and Eliquis - says he has been feeling extra tired since starting new medications - BP today is 108/64 - HR 104 EKG done and will forward to provider

## 2020-03-29 NOTE — Progress Notes (Signed)
Per Dr Purvis Sheffield Heart rate is better controlled but not under optimal control. I would increase atenolol to 50 mg daily and reduce losartan to 50 mg daily. This may help make him feel better.   Pt voiced understanding - updated medication list

## 2020-03-31 ENCOUNTER — Telehealth: Payer: Self-pay | Admitting: Cardiovascular Disease

## 2020-03-31 MED ORDER — ATENOLOL 50 MG PO TABS: 50.0000 mg | ORAL_TABLET | Freq: Every day | ORAL | 1 refills | Status: DC

## 2020-03-31 NOTE — Telephone Encounter (Signed)
Please give pt a call concerning his medication atenolol (TENORMIN) 50 MG tablet [[id number]]   [phone]

## 2020-03-31 NOTE — Telephone Encounter (Signed)
Pt informed to only take atenolol 50 mg Daily. New order placed.

## 2020-04-20 NOTE — Progress Notes (Addendum)
Cardiology Office Note    Date:  05/03/2020   ID:  [name], DOB 04-11-1951, MRN [id number]  PCP:  Elfredia Nevins, MD  Cardiologist: Prentice Docker, MD EPS: None  Chief Complaint  Patient presents with  . Follow-up    History of Present Illness:  [name] is a 69 y.o. male with history of HTN, DM2, myasthenia gravis, hypothyroidism, most likely Covid19 10/2018.  Patient saw Dr. Purvis Sheffield 03/22/20 for palpitations and shortness of breath and was in Afib with RVR 121/m. CCB and propranolol are meds that can provoke his myasthenia. He spoke with pharmacy who recommended atenolol or amiodarone. He was started on atenolol 25 mg daily and eliquis 5 mg bid. If HR difficult to control and LA size not prohibitive consider DCCV after several weeks of anticoagulation. BP was up as well. Atenolol increased 50 mg daily.  Patient comes in accompanied by his wife. Complains of atenolol making him short of breath, dizziness, blurred vision,feels terrible. Hasn't missed any eliquis doses.  Past Medical History:  Diagnosis Date  . Candida esophagitis (HCC) OCT 2014  . Deafness in right ear   . Diabetes mellitus without complication (HCC)   . Esophageal stricture SEP 2011   FOOD IMPACTION-->SAV DIL 16 MM  . GERD (gastroesophageal reflux disease)   . Hypertension   . Kidney stone   . Myasthenia gravis (HCC)   . Thyroid disease     Past Surgical History:  Procedure Laterality Date  . BIOPSY  02/14/2018   Procedure: BIOPSY;  Surgeon: West Bali, MD;  [facility];  Service: Endoscopy;;  gastric  . CHOLECYSTECTOMY    . COLONOSCOPY  2005   Dr. Lovell Sheehan  . COLONOSCOPY N/A 07/17/2014   Procedure: COLONOSCOPY;  Surgeon: West Bali, MD;  [facility];  Service: Endoscopy;  Laterality: N/A;  9:30-rescheduled 8/28 same time Soledad Gerlach to notify pt  . ESOPHAGOGASTRODUODENOSCOPY N/A 07/25/2013   Shallow ulcer in the cardia, mid esophageal web, stricture at GE  junction  . ESOPHAGOGASTRODUODENOSCOPY N/A 02/14/2018   Procedure: ESOPHAGOGASTRODUODENOSCOPY (EGD);  Surgeon: West Bali, MD;  [facility];  Service: Endoscopy;  Laterality: N/A;  11:15am  . ESOPHAGOGASTRODUODENOSCOPY (EGD) WITH ESOPHAGEAL DILATION N/A 08/29/2013   Candida esophagitis, small hiatal hernia, moderate nonerosive gastritis, mid esophageal web  . HERNIA REPAIR     right inguinal  . KIDNEY STONE SURGERY    . NASAL SEPTUM SURGERY    . PALATE SURGERY        . SAVORY DILATION N/A 02/14/2018   Procedure: SAVORY DILATION;  Surgeon: West Bali, MD;  [facility];  Service: Endoscopy;  Laterality: N/A;  . THYMECTOMY    . TONSILLECTOMY    . UPPER GASTROINTESTINAL ENDOSCOPY  SEP 2011   SAV DIL    Current Medications: Current Meds  Medication Sig  . acyclovir (ZOVIRAX) 400 MG tablet Take 400 mg by mouth 2 (two) times daily.  [name] apixaban (ELIQUIS) 5 MG TABS tablet Take 1 tablet (5 mg total) by mouth 2 (two) times daily.  [name] atenolol (TENORMIN) 50 MG tablet Take 1 tablet (50 mg total) by mouth daily.  [name] azaTHIOprine (IMURAN) 50 MG tablet Take 150 mg by mouth 3 (three) times daily.   . Calcium Carbonate-Vitamin D (CALCIUM 600+D) 600-200 MG-UNIT TABS Take 2 tablets by mouth every morning.   [name] losartan (COZAAR) 50 MG tablet Take 1 tablet (50 mg total) by mouth daily.  . metFORMIN (GLUCOPHAGE-XR) 500  MG 24 hr tablet Take 1,000 mg by mouth 2 (two) times daily.   . Omega-3 Fatty Acids (FISH OIL) 1200 MG CAPS Take 2,400 mg by mouth every morning.   [name] omeprazole (PRILOSEC) 20 MG capsule TAKE (1) CAPSULE BY MOUTH TWICE DAILY.  . Saw Palmetto, Serenoa repens, (SAW PALMETTO PO) Take by mouth daily.  [name] SYNTHROID 137 MCG tablet Take 137 mcg by mouth daily before breakfast.   . tamsulosin (FLOMAX) 0.4 MG CAPS capsule Take 0.4 mg by mouth at bedtime.   . vitamin C (ASCORBIC ACID) 500 MG tablet Take 1,000 mg by mouth every morning.   . vitamin E 400 UNIT capsule Take  800 Units by mouth every morning.      Allergies:   Levofloxacin, Tequin [gatifloxacin], Aminoglycosides, Avelox [moxifloxacin hcl in nacl], Botox [onabotulinumtoxina], Botulinum toxins, Calcium channel blockers, Ciprofloxacin, Contrast media [iodinated diagnostic agents], Curare [tubocurarine], Dysport [abobotulinumtoxina], Erythromycin, Factive [gemifloxacin], Floxin [ofloxacin], Gentamycin [gentamicin], Interferons, Kanamycin, Ketek [telithromycin], Levaquin [levofloxacin in d5w], Limonene, Macrolides and ketolides, Magnesium-containing compounds, Myobloc [rimabotulinumtoxinb], Neomycin, Noroxin [norfloxacin], Other, Penicillamine, Procainamide, Propranolol, Quinidine, Quinine derivatives, Streptomycin, Timolol, Tobramycin, Xeomin [incobotulinumtoxina], and Zithromax [azithromycin]   Social History   Socioeconomic History  . Marital status: Married    Spouse name: Not on file  . Number of children: Not on file  . Years of education: Not on file  . Highest education level: Not on file  Occupational History  . Not on file  Tobacco Use  . Smoking status: Never Smoker  . Smokeless tobacco: Never Used  Vaping Use  . Vaping Use: Never used  Substance and Sexual Activity  . Alcohol use: No  . Drug use: No  . Sexual activity: Yes    Partners: Female    Birth control/protection: None    Comment: spouse  Other Topics Concern  . Not on file  Social History Narrative  . Not on file   Social Determinants of Health   Financial Resource Strain:   . Difficulty of Paying Living Expenses:   Food Insecurity:   . Worried About Charity fundraiser in the Last Year:   . Arboriculturist in the Last Year:   Transportation Needs:   . Film/video editor (Medical):   [name] Lack of Transportation (Non-Medical):   Physical Activity:   . Days of Exercise per Week:   . Minutes of Exercise per Session:   Stress:   . Feeling of Stress :   Social Connections:   . Frequency of Communication with  Friends and Family:   . Frequency of Social Gatherings with Friends and Family:   . Attends Religious Services:   . Active Member of Clubs or Organizations:   . Attends Archivist Meetings:   [name] Marital Status:      Family History:  The patient's family history includes Liver disease in his father.   ROS:   Please see the history of present illness.    ROS All other systems reviewed and are negative.   PHYSICAL EXAM:   VS:  BP 110/64   Pulse 75   Ht 6\' 1"  (1.854 m)   Wt 213 lb (96.6 kg)   SpO2 97%   BMI 28.10 kg/m   Physical Exam  GEN: Well nourished, well developed, in no acute distress  Neck: no JVD, carotid bruits, or masses Cardiac:irreg irreg; no murmurs, rubs, or gallops  Respiratory:  clear to auscultation bilaterally, normal work of breathing GI: soft, nontender, nondistended, +  BS Ext: without cyanosis, clubbing, or edema, Good distal pulses bilaterally Neuro:  Alert and Oriented x 3 Psych: euthymic mood, full affect  Wt Readings from Last 3 Encounters:  05/03/20 213 lb (96.6 kg)  03/22/20 207 lb 3.7 oz (94 kg)  03/22/20 208 lb (94.3 kg)      Studies/Labs Reviewed:   EKG:  EKG is not ordered today.   Recent Labs: No results found for requested labs within last 8760 hours.   Lipid Panel No results found for: CHOL, TRIG, HDL, CHOLHDL, VLDL, LDLCALC, LDLDIRECT  Additional studies/ records that were reviewed today include:       ASSESSMENT:    1. Persistent atrial fibrillation (HCC)   2. Essential hypertension   3. Myasthenia gravis (HCC)      PLAN:  In order of problems listed above:  Atrial fibrillation with RVR started on atenolol and Eliquis 03/22/20. Plan for echo once HR controlled and possible DCCV.  Heart rate controlled today but patient is not tolerating a atenolol very well.  We will plan to get 2D echo this week and if everything looks okay we will schedule cardioversion for next week.  He has not missed any Eliquis doses.   With his myasthenia gravis and intolerance to many medications if he goes back into A. fib consider referral to A. fib clinic. Cardioversion discussed with patient and he's will to proceed. We'll arrange next week.  Addendum: Echo EF 45-50% no WMA LA normal in size. Needs repeat echo when in NSR. Will schedule DCCV 05/20/20. Check labs  HTN blood pressure controlled  Myasthenia gravis-followed at [facility] and many meds prohibitive because of worsening myasthenia     Medication Adjustments/Labs and Tests Ordered: Current medicines are reviewed at length with the patient today.  Concerns regarding medicines are outlined above.  Medication changes, Labs and Tests ordered today are listed in the Patient Instructions below. Patient Instructions  Medication Instructions:  Your physician recommends that you continue on your current medications as directed. Please refer to the Current Medication list given to you today.  *If you need a refill on your cardiac medications before your next appointment, please call your pharmacy*   Lab Work: NONE   If you have labs (blood work) drawn today and your tests are completely normal, you will receive your results only by: [name] MyChart Message (if you have MyChart) OR . A paper copy in the mail If you have any lab test that is abnormal or we need to change your treatment, we will call you to review the results.   Testing/Procedures: Your physician has requested that you have an echocardiogram. Echocardiography is a painless test that uses sound waves to create images of your heart. It provides your doctor with information about the size and shape of your heart and how well your heart's chambers and valves are working. This procedure takes approximately one hour. There are no restrictions for this procedure.     Follow-Up: At [facility], you and your health needs are our priority.  As part of our continuing mission to provide you with exceptional heart  care, we have created designated Provider Care Teams.  These Care Teams include your primary Cardiologist (physician) and Advanced Practice Providers (APPs -  Physician Assistants and Nurse Practitioners) who all work together to provide you with the care you need, when you need it.  We recommend signing up for the patient portal called "MyChart".  Sign up information is provided on this After Visit  Summary.  MyChart is used to connect with patients for Virtual Visits (Telemedicine).  Patients are able to view lab/test results, encounter notes, upcoming appointments, etc.  Non-urgent messages can be sent to your provider as well.   To learn more about what you can do with MyChart, go to [email or web address].    Your next appointment:    Pending test results   The format for your next appointment:   In Person  Provider:   Prentice Docker, MD   Other Instructions Thank you for choosing [facility]!       Elson Clan, PA-C  05/03/2020 2:30 PM    [facility] [address] [place], [place], [place]  [address] Phone: [phone]; Fax: [phone]

## 2020-05-03 ENCOUNTER — Other Ambulatory Visit: Payer: Self-pay

## 2020-05-03 ENCOUNTER — Encounter: Payer: Self-pay | Admitting: Physician Assistant

## 2020-05-03 ENCOUNTER — Ambulatory Visit (INDEPENDENT_AMBULATORY_CARE_PROVIDER_SITE_OTHER): Payer: Managed Care, Other (non HMO) | Admitting: Physician Assistant

## 2020-05-03 VITALS — BP 110/64 | HR 75 | Ht 73.0 in | Wt 213.0 lb

## 2020-05-03 DIAGNOSIS — I10 Essential (primary) hypertension: Secondary | ICD-10-CM | POA: Diagnosis not present

## 2020-05-03 DIAGNOSIS — G7000 Myasthenia gravis without (acute) exacerbation: Secondary | ICD-10-CM

## 2020-05-03 DIAGNOSIS — I4819 Other persistent atrial fibrillation: Secondary | ICD-10-CM

## 2020-05-03 NOTE — Patient Instructions (Signed)
Medication Instructions:  Your physician recommends that you continue on your current medications as directed. Please refer to the Current Medication list given to you today.  *If you need a refill on your cardiac medications before your next appointment, please call your pharmacy*   Lab Work: NONE   If you have labs (blood work) drawn today and your tests are completely normal, you will receive your results only by: [name] MyChart Message (if you have MyChart) OR . A paper copy in the mail If you have any lab test that is abnormal or we need to change your treatment, we will call you to review the results.   Testing/Procedures: Your physician has requested that you have an echocardiogram. Echocardiography is a painless test that uses sound waves to create images of your heart. It provides your doctor with information about the size and shape of your heart and how well your heart's chambers and valves are working. This procedure takes approximately one hour. There are no restrictions for this procedure.     Follow-Up: At [facility], you and your health needs are our priority.  As part of our continuing mission to provide you with exceptional heart care, we have created designated Provider Care Teams.  These Care Teams include your primary Cardiologist (physician) and Advanced Practice Providers (APPs -  Physician Assistants and Nurse Practitioners) who all work together to provide you with the care you need, when you need it.  We recommend signing up for the patient portal called "MyChart".  Sign up information is provided on this After Visit Summary.  MyChart is used to connect with patients for Virtual Visits (Telemedicine).  Patients are able to view lab/test results, encounter notes, upcoming appointments, etc.  Non-urgent messages can be sent to your provider as well.   To learn more about what you can do with MyChart, go to [email or web address].    Your next appointment:     Pending test results   The format for your next appointment:   In Person  Provider:   Prentice Docker, MD   Other Instructions Thank you for choosing [facility]!

## 2020-05-04 ENCOUNTER — Telehealth: Payer: Self-pay | Admitting: Licensed Clinical Social Worker

## 2020-05-04 NOTE — Telephone Encounter (Signed)
CSW contacted patient to share about the upcoming cooking series at [facility]. Message left for return call. Jackie Deja Pisarski, LCSW, CCSW-MCS [phone] 

## 2020-05-11 ENCOUNTER — Ambulatory Visit (HOSPITAL_COMMUNITY)
Admission: RE | Admit: 2020-05-11 | Discharge: 2020-05-11 | Disposition: A | Discharge status: Home / Self Care | Payer: Managed Care, Other (non HMO) | Source: Ambulatory Visit | Attending: Internal Medicine | Admitting: Internal Medicine

## 2020-05-11 ENCOUNTER — Telehealth: Payer: Self-pay | Admitting: *Deleted

## 2020-05-11 ENCOUNTER — Other Ambulatory Visit: Payer: Self-pay

## 2020-05-11 DIAGNOSIS — I4819 Other persistent atrial fibrillation: Secondary | ICD-10-CM

## 2020-05-11 DIAGNOSIS — I10 Essential (primary) hypertension: Secondary | ICD-10-CM

## 2020-05-11 DIAGNOSIS — E119 Type 2 diabetes mellitus without complications: Secondary | ICD-10-CM | POA: Insufficient documentation

## 2020-05-11 DIAGNOSIS — I340 Nonrheumatic mitral (valve) insufficiency: Secondary | ICD-10-CM

## 2020-05-11 DIAGNOSIS — G7000 Myasthenia gravis without (acute) exacerbation: Secondary | ICD-10-CM | POA: Insufficient documentation

## 2020-05-11 DIAGNOSIS — I351 Nonrheumatic aortic (valve) insufficiency: Secondary | ICD-10-CM | POA: Diagnosis present

## 2020-05-11 DIAGNOSIS — I361 Nonrheumatic tricuspid (valve) insufficiency: Secondary | ICD-10-CM | POA: Diagnosis not present

## 2020-05-11 DIAGNOSIS — I4891 Unspecified atrial fibrillation: Secondary | ICD-10-CM | POA: Diagnosis not present

## 2020-05-11 DIAGNOSIS — R0600 Dyspnea, unspecified: Secondary | ICD-10-CM | POA: Diagnosis not present

## 2020-05-11 DIAGNOSIS — Z01818 Encounter for other preprocedural examination: Secondary | ICD-10-CM

## 2020-05-11 NOTE — Addendum Note (Signed)
Addended by: Dyann Kief on: 05/11/2020 08:49 AM   Modules accepted: Orders

## 2020-05-11 NOTE — Telephone Encounter (Signed)
-----   Message from Dyann Kief, PA-C sent at 05/11/2020  3:01 PM EDT ----- [place] ignore this and Jake Seats can take care of scheduling DCCV

## 2020-05-11 NOTE — Addendum Note (Signed)
Addended by: Dyann Kief on: 05/11/2020 03:36 PM   Modules accepted: Orders, SmartSet

## 2020-05-11 NOTE — Progress Notes (Signed)
*  PRELIMINARY RESULTS* Echocardiogram 2D Echocardiogram has been performed.  Stacey Drain 05/11/2020, 9:27 AM

## 2020-05-12 ENCOUNTER — Other Ambulatory Visit (HOSPITAL_COMMUNITY): Payer: Managed Care, Other (non HMO)

## 2020-05-18 ENCOUNTER — Other Ambulatory Visit (HOSPITAL_COMMUNITY): Payer: Managed Care, Other (non HMO)

## 2020-05-18 ENCOUNTER — Encounter (HOSPITAL_COMMUNITY): Admission: RE | Admit: 2020-05-18 | Payer: Managed Care, Other (non HMO) | Source: Ambulatory Visit

## 2020-05-18 ENCOUNTER — Encounter: Payer: Self-pay | Admitting: *Deleted

## 2020-05-18 ENCOUNTER — Telehealth: Payer: Self-pay | Admitting: Cardiology

## 2020-05-18 NOTE — Telephone Encounter (Signed)
Pt's wife has questions/changes UP:[id number]   Please call [phone]   thanks renee

## 2020-05-19 NOTE — Telephone Encounter (Signed)
Wife states they did not realize they had to quarantine after Covid test on Friday. They now will comply.

## 2020-05-21 ENCOUNTER — Encounter (HOSPITAL_COMMUNITY): Payer: Managed Care, Other (non HMO)

## 2020-05-21 ENCOUNTER — Other Ambulatory Visit (HOSPITAL_COMMUNITY): Payer: Managed Care, Other (non HMO)

## 2020-05-25 ENCOUNTER — Ambulatory Visit: Admit: 2020-05-25 | Payer: Managed Care, Other (non HMO) | Admitting: Cardiology

## 2020-05-25 SURGERY — CARDIOVERSION
Anesthesia: Monitor Anesthesia Care

## 2020-05-28 ENCOUNTER — Telehealth: Payer: Self-pay | Admitting: Cardiology

## 2020-05-28 NOTE — Telephone Encounter (Signed)
Wife notified of date and time of cardioversion

## 2020-05-28 NOTE — Telephone Encounter (Signed)
Pt questioning arrival time for Cardioversion   Please call [phone]  Thanks renee

## 2020-06-02 NOTE — Patient Instructions (Signed)
Sakib Noguez  06/02/2020     @PREFPERIOPPHARMACY @   Your procedure is scheduled on  06/08/2020 .  Report to 06/10/2020 at  1130 A.M.  Call this number if you have problems the morning of surgery:  [phone]   Remember:  Do not eat or drink after midnight.                      Take these medicines the morning of surgery with A SIP OF WATER  Imuran, prilosec, synthroid, flomax. DO NOT miss any doses of your eliquis.  DO NOT take any medications for diabetes the morning of your procedure.    Do not wear jewelry, make-up or nail polish.  Do not wear lotions, powders, or perfumes. Please wear deodorant and brush your teeth.  Do not shave 48 hours prior to surgery.  Men may shave face and neck.  Do not bring valuables to the hospital.  [facility] is not responsible for any belongings or valuables.  Contacts, dentures or bridgework may not be worn into surgery.  Leave your suitcase in the car.  After surgery it may be brought to your room.  For patients admitted to the hospital, discharge time will be determined by your treatment team.  Patients discharged the day of surgery will not be allowed to drive home.   Name and phone number of your driver:   family Special instructions:  DO NOT smoke the morning of your procedure.  Please read over the following fact sheets that you were given. Anesthesia Post-op Instructions and Care and Recovery After Surgery       Electrical Cardioversion Electrical cardioversion is the delivery of a jolt of electricity to restore a normal rhythm to the heart. A rhythm that is too fast or is not regular keeps the heart from pumping well. In this procedure, sticky patches or metal paddles are placed on the chest to deliver electricity to the heart from a device. This procedure may be done in an emergency if:  There is low or no blood pressure as a result of the heart rhythm.  Normal rhythm must be restored as fast as possible to  protect the brain and heart from further damage.  It may save a life. This may also be a scheduled procedure for irregular or fast heart rhythms that are not immediately life-threatening. Tell a health care provider about:  Any allergies you have.  All medicines you are taking, including vitamins, herbs, eye drops, creams, and over-the-counter medicines.  Any problems you or family members have had with anesthetic medicines.  Any blood disorders you have.  Any surgeries you have had.  Any medical conditions you have.  Whether you are pregnant or may be pregnant. What are the risks? Generally, this is a safe procedure. However, problems may occur, including:  Allergic reactions to medicines.  A blood clot that breaks free and travels to other parts of your body.  The possible return of an abnormal heart rhythm within hours or days after the procedure.  Your heart stopping (cardiac arrest). This is rare. What happens before the procedure? Medicines  Your health care provider may have you start taking: ? Blood-thinning medicines (anticoagulants) so your blood does not clot as easily. ? Medicines to help stabilize your heart rate and rhythm.  Ask your health care provider about: ? Changing or stopping your regular medicines. This is especially important if you are taking diabetes  medicines or blood thinners. ? Taking medicines such as aspirin and ibuprofen. These medicines can thin your blood. Do not take these medicines unless your health care provider tells you to take them. ? Taking over-the-counter medicines, vitamins, herbs, and supplements. General instructions  Follow instructions from your health care provider about eating or drinking restrictions.  Plan to have someone take you home from the hospital or clinic.  If you will be going home right after the procedure, plan to have someone with you for 24 hours.  Ask your health care provider what steps will be taken  to help prevent infection. These may include washing your skin with a germ-killing soap. What happens during the procedure?   An IV will be inserted into one of your veins.  Sticky patches (electrodes) or metal paddles may be placed on your chest.  You will be given a medicine to help you relax (sedative).  An electrical shock will be delivered. The procedure may vary among health care providers and hospitals. What can I expect after the procedure?  Your blood pressure, heart rate, breathing rate, and blood oxygen level will be monitored until you leave the hospital or clinic.  Your heart rhythm will be watched to make sure it does not change.  You may have some redness on the skin where the shocks were given. Follow these instructions at home:  Do not drive for 24 hours if you were given a sedative during your procedure.  Take over-the-counter and prescription medicines only as told by your health care provider.  Ask your health care provider how to check your pulse. Check it often.  Rest for 48 hours after the procedure or as told by your health care provider.  Avoid or limit your caffeine use as told by your health care provider.  Keep all follow-up visits as told by your health care provider. This is important. Contact a health care provider if:  You feel like your heart is beating too quickly or your pulse is not regular.  You have a serious muscle cramp that does not go away. Get help right away if:  You have discomfort in your chest.  You are dizzy or you feel faint.  You have trouble breathing or you are short of breath.  Your speech is slurred.  You have trouble moving an arm or leg on one side of your body.  Your fingers or toes turn cold or blue. Summary  Electrical cardioversion is the delivery of a jolt of electricity to restore a normal rhythm to the heart.  This procedure may be done right away in an emergency or may be a scheduled procedure if the  condition is not an emergency.  Generally, this is a safe procedure.  After the procedure, check your pulse often as told by your health care provider. This information is not intended to replace advice given to you by your health care provider. Make sure you discuss any questions you have with your health care provider. Document Revised: 06/09/2019 Document Reviewed: 06/09/2019 Elsevier Patient Education  2020 [employer] Monitored Anesthesia Care, Care After These instructions provide you with information about caring for yourself after your procedure. Your health care provider may also give you more specific instructions. Your treatment has been planned according to current medical practices, but problems sometimes occur. Call your health care provider if you have any problems or questions after your procedure. What can I expect after the procedure? After your procedure, you may:  Feel  sleepy for several hours.  Feel clumsy and have poor balance for several hours.  Feel forgetful about what happened after the procedure.  Have poor judgment for several hours.  Feel nauseous or vomit.  Have a sore throat if you had a breathing tube during the procedure. Follow these instructions at home: For at least 24 hours after the procedure:      Have a responsible adult stay with you. It is important to have someone help care for you until you are awake and alert.  Rest as needed.  Do not: ? Participate in activities in which you could fall or become injured. ? Drive. ? Use heavy machinery. ? Drink alcohol. ? Take sleeping pills or medicines that cause drowsiness. ? Make important decisions or sign legal documents. ? Take care of children on your own. Eating and drinking  Follow the diet that is recommended by your health care provider.  If you vomit, drink water, juice, or soup when you can drink without vomiting.  Make sure you have little or no nausea before eating solid  foods. General instructions  Take over-the-counter and prescription medicines only as told by your health care provider.  If you have sleep apnea, surgery and certain medicines can increase your risk for breathing problems. Follow instructions from your health care provider about wearing your sleep device: ? Anytime you are sleeping, including during daytime naps. ? While taking prescription pain medicines, sleeping medicines, or medicines that make you drowsy.  If you smoke, do not smoke without supervision.  Keep all follow-up visits as told by your health care provider. This is important. Contact a health care provider if:  You keep feeling nauseous or you keep vomiting.  You feel light-headed.  You develop a rash.  You have a fever. Get help right away if:  You have trouble breathing. Summary  For several hours after your procedure, you may feel sleepy and have poor judgment.  Have a responsible adult stay with you for at least 24 hours or until you are awake and alert. This information is not intended to replace advice given to you by your health care provider. Make sure you discuss any questions you have with your health care provider. Document Revised: 02/04/2018 Document Reviewed: 02/27/2016 Elsevier Patient Education  2020 [employer].

## 2020-06-04 ENCOUNTER — Other Ambulatory Visit (HOSPITAL_COMMUNITY)
Admission: RE | Admit: 2020-06-04 | Discharge: 2020-06-04 | Disposition: A | Discharge status: Home / Self Care | Payer: Managed Care, Other (non HMO) | Source: Ambulatory Visit | Attending: Cardiology | Admitting: Cardiology

## 2020-06-04 ENCOUNTER — Encounter (HOSPITAL_COMMUNITY)
Admission: RE | Admit: 2020-06-04 | Discharge: 2020-06-04 | Disposition: A | Discharge status: Home / Self Care | Payer: Managed Care, Other (non HMO) | Source: Ambulatory Visit | Attending: Cardiology | Admitting: Cardiology

## 2020-06-04 ENCOUNTER — Other Ambulatory Visit: Payer: Self-pay

## 2020-06-04 ENCOUNTER — Encounter (HOSPITAL_COMMUNITY): Payer: Self-pay

## 2020-06-04 ENCOUNTER — Other Ambulatory Visit (HOSPITAL_COMMUNITY)
Admission: RE | Admit: 2020-06-04 | Discharge: 2020-06-04 | Disposition: A | Discharge status: Home / Self Care | Payer: Managed Care, Other (non HMO) | Source: Ambulatory Visit | Attending: Physician Assistant | Admitting: Physician Assistant

## 2020-06-04 DIAGNOSIS — I4819 Other persistent atrial fibrillation: Secondary | ICD-10-CM | POA: Insufficient documentation

## 2020-06-04 DIAGNOSIS — Z01818 Encounter for other preprocedural examination: Secondary | ICD-10-CM | POA: Diagnosis present

## 2020-06-04 DIAGNOSIS — I10 Essential (primary) hypertension: Secondary | ICD-10-CM | POA: Insufficient documentation

## 2020-06-04 HISTORY — DX: Failed or difficult intubation, initial encounter: T88.4XXA

## 2020-06-04 HISTORY — DX: Sleep apnea, unspecified: G47.30

## 2020-06-04 LAB — CBC
HCT: 41.7 % (ref 39.0–52.0)
Hemoglobin: 13.8 g/dL (ref 13.0–17.0)
MCH: 31 pg (ref 26.0–34.0)
MCHC: 33.1 g/dL (ref 30.0–36.0)
MCV: 93.7 fL (ref 80.0–100.0)
Platelets: 215 10*3/uL (ref 150–400)
RBC: 4.45 MIL/uL (ref 4.22–5.81)
RDW: 15.8 % — ABNORMAL HIGH (ref 11.5–15.5)
WBC: 6.7 10*3/uL (ref 4.0–10.5)
nRBC: 0.3 % — ABNORMAL HIGH (ref 0.0–0.2)

## 2020-06-04 LAB — BASIC METABOLIC PANEL
Anion gap: 11 (ref 5–15)
BUN: 16 mg/dL (ref 8–23)
CO2: 23 mmol/L (ref 22–32)
Calcium: 8.8 mg/dL — ABNORMAL LOW (ref 8.9–10.3)
Chloride: 105 mmol/L (ref 98–111)
Creatinine, Ser: 1.29 mg/dL — ABNORMAL HIGH (ref 0.61–1.24)
GFR calc Af Amer: >60 mL/min (ref >60)
GFR calc non Af Amer: 57 mL/min — ABNORMAL LOW (ref >60)
Glucose, Bld: 137 mg/dL — ABNORMAL HIGH (ref 70–99)
Potassium: 4.2 mmol/L (ref 3.5–5.1)
Sodium: 139 mmol/L (ref 135–145)

## 2020-06-04 NOTE — Progress Notes (Deleted)
Called patient, he said Dr. Gilford Silvius office cancelled his Covid screening.  I called the Dr.'s office to confirm only because as of today or Monday [facility] is going to start Covid testing [facility] patients again. Dr. Gilford Silvius office was told they are still not going to do Cvoid screenings for [facility] patients. Rosey Bath called me just a few minutes ago. Her or Selena Batten talked to patient and rescheduled him for Monday. Nothing further needed.

## 2020-06-04 NOTE — Progress Notes (Signed)
Rosey Bath called me a little while ago. Her or Selena Batten spoke with Mr. Dorsch and rescheduled him for Monday. For his Covid screening. Nothing further needed.

## 2020-06-07 ENCOUNTER — Other Ambulatory Visit: Payer: Self-pay

## 2020-06-07 ENCOUNTER — Other Ambulatory Visit (HOSPITAL_COMMUNITY)
Admission: RE | Admit: 2020-06-07 | Discharge: 2020-06-07 | Disposition: A | Discharge status: Home / Self Care | Payer: Managed Care, Other (non HMO) | Source: Ambulatory Visit | Attending: Cardiology | Admitting: Cardiology

## 2020-06-07 ENCOUNTER — Encounter (HOSPITAL_COMMUNITY): Payer: Self-pay

## 2020-06-07 DIAGNOSIS — Z01812 Encounter for preprocedural laboratory examination: Secondary | ICD-10-CM | POA: Insufficient documentation

## 2020-06-07 DIAGNOSIS — Z20822 Contact with and (suspected) exposure to covid-19: Secondary | ICD-10-CM | POA: Insufficient documentation

## 2020-06-07 LAB — SARS CORONAVIRUS 2 (TAT 6-24 HRS): SARS Coronavirus 2: NEGATIVE

## 2020-06-07 LAB — PROTIME-INR
INR: 1.1 (ref 0.8–1.2)
Prothrombin Time: 14.1 seconds (ref 11.4–15.2)

## 2020-06-07 LAB — HEMOGLOBIN A1C
Hgb A1c MFr Bld: 7.1 % — ABNORMAL HIGH (ref 4.8–5.6)
Mean Plasma Glucose: 157.07 mg/dL

## 2020-06-08 ENCOUNTER — Ambulatory Visit (HOSPITAL_COMMUNITY)
Admission: RE | Admit: 2020-06-08 | Discharge: 2020-06-08 | Disposition: A | Discharge status: Home / Self Care | Payer: Managed Care, Other (non HMO) | Attending: Cardiology | Admitting: Cardiology

## 2020-06-08 ENCOUNTER — Ambulatory Visit (HOSPITAL_COMMUNITY): Payer: Managed Care, Other (non HMO) | Admitting: Anesthesiology

## 2020-06-08 ENCOUNTER — Encounter (HOSPITAL_COMMUNITY)
Admission: RE | Disposition: A | Discharge status: Home / Self Care | Payer: Self-pay | Source: Home / Self Care | Attending: Cardiology

## 2020-06-08 ENCOUNTER — Telehealth: Payer: Self-pay | Admitting: Cardiovascular Disease

## 2020-06-08 ENCOUNTER — Telehealth: Payer: Self-pay

## 2020-06-08 ENCOUNTER — Encounter (HOSPITAL_COMMUNITY): Payer: Self-pay | Admitting: Cardiology

## 2020-06-08 DIAGNOSIS — E1144 Type 2 diabetes mellitus with diabetic amyotrophy: Secondary | ICD-10-CM | POA: Insufficient documentation

## 2020-06-08 DIAGNOSIS — K219 Gastro-esophageal reflux disease without esophagitis: Secondary | ICD-10-CM | POA: Diagnosis not present

## 2020-06-08 DIAGNOSIS — G4730 Sleep apnea, unspecified: Secondary | ICD-10-CM | POA: Diagnosis not present

## 2020-06-08 DIAGNOSIS — Z888 Allergy status to other drugs, medicaments and biological substances status: Secondary | ICD-10-CM | POA: Diagnosis not present

## 2020-06-08 DIAGNOSIS — I10 Essential (primary) hypertension: Secondary | ICD-10-CM | POA: Insufficient documentation

## 2020-06-08 DIAGNOSIS — Z79899 Other long term (current) drug therapy: Secondary | ICD-10-CM

## 2020-06-08 DIAGNOSIS — E039 Hypothyroidism, unspecified: Secondary | ICD-10-CM | POA: Insufficient documentation

## 2020-06-08 DIAGNOSIS — Z7984 Long term (current) use of oral hypoglycemic drugs: Secondary | ICD-10-CM | POA: Diagnosis not present

## 2020-06-08 DIAGNOSIS — Z7901 Long term (current) use of anticoagulants: Secondary | ICD-10-CM | POA: Diagnosis not present

## 2020-06-08 DIAGNOSIS — Z881 Allergy status to other antibiotic agents status: Secondary | ICD-10-CM | POA: Diagnosis not present

## 2020-06-08 DIAGNOSIS — I4819 Other persistent atrial fibrillation: Secondary | ICD-10-CM | POA: Diagnosis present

## 2020-06-08 DIAGNOSIS — I4891 Unspecified atrial fibrillation: Secondary | ICD-10-CM

## 2020-06-08 DIAGNOSIS — Z91041 Radiographic dye allergy status: Secondary | ICD-10-CM | POA: Insufficient documentation

## 2020-06-08 HISTORY — PX: CARDIOVERSION: SHX1299

## 2020-06-08 LAB — GLUCOSE, CAPILLARY: Glucose-Capillary: 132 mg/dL — ABNORMAL HIGH (ref 70–99)

## 2020-06-08 SURGERY — CARDIOVERSION
Anesthesia: General

## 2020-06-08 MED ORDER — LACTATED RINGERS IV SOLN: Freq: Once | INTRAVENOUS | Status: AC

## 2020-06-08 MED ORDER — HYDROCORTISONE 1 % EX CREA: 1.0000 "application " | TOPICAL_CREAM | Freq: Three times a day (TID) | CUTANEOUS | Status: DC | PRN

## 2020-06-08 MED ORDER — LACTATED RINGERS IV SOLN
INTRAVENOUS | Status: DC | PRN
Start: 2020-06-08 — End: 2020-06-08

## 2020-06-08 MED ORDER — PROPOFOL 10 MG/ML IV BOLUS
INTRAVENOUS | Status: DC | PRN
  Administered 2020-06-08: 70 mg via INTRAVENOUS

## 2020-06-08 NOTE — Telephone Encounter (Signed)
New message    Patient called he just had cardioversion done and was not given any follow up appointment information.   When does he need to come back to office

## 2020-06-08 NOTE — Anesthesia Preprocedure Evaluation (Addendum)
Anesthesia Evaluation  Patient identified by MRN, date of birth, ID band Patient awake    Reviewed: Allergy & Precautions, NPO status , Patient's Chart, lab work & pertinent test results, reviewed documented beta blocker date and time   History of Anesthesia Complications (+) DIFFICULT AIRWAY and history of anesthetic complications  Airway Mallampati: III  TM Distance: >3 FB Neck ROM: Full   Comment: Difficult airway Dental  (+) Teeth Intact, Dental Advisory Given   Pulmonary sleep apnea ,    Pulmonary exam normal breath sounds clear to auscultation       Cardiovascular Exercise Tolerance: Good hypertension, Pt. on medications and Pt. on home beta blockers + dysrhythmias Atrial Fibrillation  Rhythm:Irregular Rate:Tachycardia - Systolic murmurs, - Diastolic murmurs, - Friction Rub, - Carotid Bruit, - Peripheral Edema and - Systolic Click    Neuro/Psych  Neuromuscular disease (myasthenia gravis)    GI/Hepatic GERD (esophageal stricture)  Medicated,  Endo/Other  diabetes, Well Controlled, Type 2Hypothyroidism   Renal/GU Renal InsufficiencyRenal disease     Musculoskeletal   Abdominal   Peds  Hematology negative hematology ROS (+)   Anesthesia Other Findings   Reproductive/Obstetrics                            Anesthesia Physical Anesthesia Plan  ASA: IV  Anesthesia Plan: General   Post-op Pain Management:    Induction: Intravenous  PONV Risk Score and Plan: 1 and TIVA  Airway Management Planned: Nasal Cannula, Natural Airway and Simple Face Mask  Additional Equipment:   Intra-op Plan:   Post-operative Plan:   Informed Consent: I have reviewed the patients History and Physical, chart, labs and discussed the procedure including the risks, benefits and alternatives for the proposed anesthesia with the patient or authorized representative who has indicated his/her understanding and  acceptance.     Dental advisory given  Plan Discussed with: CRNA and Surgeon  Anesthesia Plan Comments:         Anesthesia Quick Evaluation

## 2020-06-08 NOTE — Telephone Encounter (Signed)
Pt states his losartan was decreased from 100 mg to 50 mg when atenolol was added.   I mailed him lab slip

## 2020-06-08 NOTE — Transfer of Care (Signed)
Immediate Anesthesia Transfer of Care Note  Patient: [name]  Procedure(s) Performed: CARDIOVERSION (N/A )  Patient Location: PACU  Anesthesia Type:MAC  Level of Consciousness: awake, alert , oriented and patient cooperative  Airway & Oxygen Therapy: Patient Spontanous Breathing and Patient connected to face mask oxygen  Post-op Assessment: Report given to RN, Post -op Vital signs reviewed and stable and Patient moving all extremities  Post vital signs: Reviewed and stable  Last Vitals:  Vitals Value Taken Time  BP    Temp    Pulse    Resp    SpO2      Last Pain:  Vitals:   06/08/20 1148  TempSrc: Oral  PainSc: 0-No pain         Complications: No complications documented.

## 2020-06-08 NOTE — H&P (Signed)
Patient presents for electrical cardioversion for persistent atrial fibrillation. Please see references cardiology clinic note below for full medical history. He is on eliquis at home.     Cardiology Office Note    Date:  05/03/2020   ID:  [name], DOB [birth year], MRN [id number]  PCP:  Elfredia Nevins, MD   Cardiologist: Prentice Docker, MD EPS: None     Chief Complaint  Patient presents with  . Follow-up    History of Present Illness:  [name] is a 69 y.o. male with history of HTN, DM2, myasthenia gravis, hypothyroidism, most likely Covid19 10/2018.  Patient saw Dr. Purvis Sheffield 03/22/20 for palpitations and shortness of breath and was in Afib with RVR 121/m. CCB and propranolol are meds that can provoke his myasthenia. He spoke with pharmacy who recommended atenolol or amiodarone. He was started on atenolol 25 mg daily and eliquis 5 mg bid. If HR difficult to control and LA size not prohibitive consider DCCV after several weeks of anticoagulation. BP was up as well. Atenolol increased 50 mg daily.  Patient comes in accompanied by his wife. Complains of atenolol making him short of breath, dizziness, blurred vision,feels terrible. Hasn't missed any eliquis doses.      Past Medical History:  Diagnosis Date  . Candida esophagitis (HCC) OCT 2014  . Deafness in right ear   . Diabetes mellitus without complication (HCC)   . Esophageal stricture SEP 2011   FOOD IMPACTION-->SAV DIL 16 MM  . GERD (gastroesophageal reflux disease)   . Hypertension   . Kidney stone   . Myasthenia gravis (HCC)   . Thyroid disease          Past Surgical History:  Procedure Laterality Date  . BIOPSY  02/14/2018   Procedure: BIOPSY;  Surgeon: West Bali, MD;  Location: [facility];  Service: Endoscopy;;  gastric  . CHOLECYSTECTOMY    . COLONOSCOPY  2005   Dr. Lovell Sheehan  . COLONOSCOPY N/A 07/17/2014   Procedure: COLONOSCOPY;  Surgeon: West Bali, MD;   Location: [facility];  Service: Endoscopy;  Laterality: N/A;  9:30-rescheduled 8/28 same time Soledad Gerlach to notify pt  . ESOPHAGOGASTRODUODENOSCOPY N/A 07/25/2013   Shallow ulcer in the cardia, mid esophageal web, stricture at GE junction  . ESOPHAGOGASTRODUODENOSCOPY N/A 02/14/2018   Procedure: ESOPHAGOGASTRODUODENOSCOPY (EGD);  Surgeon: West Bali, MD;  Location: [facility];  Service: Endoscopy;  Laterality: N/A;  11:15am  . ESOPHAGOGASTRODUODENOSCOPY (EGD) WITH ESOPHAGEAL DILATION N/A 08/29/2013   Candida esophagitis, small hiatal hernia, moderate nonerosive gastritis, mid esophageal web  . HERNIA REPAIR     right inguinal  . KIDNEY STONE SURGERY    . NASAL SEPTUM SURGERY    . PALATE SURGERY        . SAVORY DILATION N/A 02/14/2018   Procedure: SAVORY DILATION;  Surgeon: West Bali, MD;  Location: [facility];  Service: Endoscopy;  Laterality: N/A;  . THYMECTOMY    . TONSILLECTOMY    . UPPER GASTROINTESTINAL ENDOSCOPY  SEP 2011   SAV DIL    Current Medications: Active Medications      Current Meds  Medication Sig  . acyclovir (ZOVIRAX) 400 MG tablet Take 400 mg by mouth 2 (two) times daily.  [name] apixaban (ELIQUIS) 5 MG TABS tablet Take 1 tablet (5 mg total) by mouth 2 (two) times daily.  [name] atenolol (TENORMIN) 50 MG tablet Take 1 tablet (50 mg total) by mouth daily.  [name] azaTHIOprine (IMURAN) 50 MG tablet  Take 150 mg by mouth 3 (three) times daily.   . Calcium Carbonate-Vitamin D (CALCIUM 600+D) 600-200 MG-UNIT TABS Take 2 tablets by mouth every morning.   [name] losartan (COZAAR) 50 MG tablet Take 1 tablet (50 mg total) by mouth daily.  . metFORMIN (GLUCOPHAGE-XR) 500 MG 24 hr tablet Take 1,000 mg by mouth 2 (two) times daily.   . Omega-3 Fatty Acids (FISH OIL) 1200 MG CAPS Take 2,400 mg by mouth every morning.   [name] omeprazole (PRILOSEC) 20 MG capsule TAKE (1) CAPSULE BY MOUTH TWICE DAILY.  . Saw Palmetto, Serenoa repens, (SAW PALMETTO PO) Take by mouth  daily.  [name] SYNTHROID 137 MCG tablet Take 137 mcg by mouth daily before breakfast.   . tamsulosin (FLOMAX) 0.4 MG CAPS capsule Take 0.4 mg by mouth at bedtime.   . vitamin C (ASCORBIC ACID) 500 MG tablet Take 1,000 mg by mouth every morning.   . vitamin E 400 UNIT capsule Take 800 Units by mouth every morning.        Allergies:   Levofloxacin, Tequin [gatifloxacin], Aminoglycosides, Avelox [moxifloxacin hcl in nacl], Botox [onabotulinumtoxina], Botulinum toxins, Calcium channel blockers, Ciprofloxacin, Contrast media [iodinated diagnostic agents], Curare [tubocurarine], Dysport [abobotulinumtoxina], Erythromycin, Factive [gemifloxacin], Floxin [ofloxacin], Gentamycin [gentamicin], Interferons, Kanamycin, Ketek [telithromycin], Levaquin [levofloxacin in d5w], Limonene, Macrolides and ketolides, Magnesium-containing compounds, Myobloc [rimabotulinumtoxinb], Neomycin, Noroxin [norfloxacin], Other, Penicillamine, Procainamide, Propranolol, Quinidine, Quinine derivatives, Streptomycin, Timolol, Tobramycin, Xeomin [incobotulinumtoxina], and Zithromax [azithromycin]   Social History        Socioeconomic History  . Marital status: Married    Spouse name: Not on file  . Number of children: Not on file  . Years of education: Not on file  . Highest education level: Not on file  Occupational History  . Not on file  Tobacco Use  . Smoking status: Never Smoker  . Smokeless tobacco: Never Used  Vaping Use  . Vaping Use: Never used  Substance and Sexual Activity  . Alcohol use: No  . Drug use: No  . Sexual activity: Yes    Partners: Female    Birth control/protection: None    Comment: spouse  Other Topics Concern  . Not on file  Social History Narrative  . Not on file   Social Determinants of Health      Financial Resource Strain:   . Difficulty of Paying Living Expenses:   Food Insecurity:   . Worried About Programme researcher, broadcasting/film/video in the Last Year:   . Barista in the Last  Year:   Transportation Needs:   . Freight forwarder (Medical):   [name] Lack of Transportation (Non-Medical):   Physical Activity:   . Days of Exercise per Week:   . Minutes of Exercise per Session:   Stress:   . Feeling of Stress :   Social Connections:   . Frequency of Communication with Friends and Family:   . Frequency of Social Gatherings with Friends and Family:   . Attends Religious Services:   . Active Member of Clubs or Organizations:   . Attends Banker Meetings:   [name] Marital Status:      Family History:  The patient's family history includes Liver disease in his father.   ROS:   Please see the history of present illness.    ROS All other systems reviewed and are negative.   PHYSICAL EXAM:   VS:  BP 110/64   Pulse 75   Ht 6\' 1"  (1.854 m)  Wt 213 lb (96.6 kg)   SpO2 97%   BMI 28.10 kg/m   Physical Exam  GEN: Well nourished, well developed, in no acute distress  Neck: no JVD, carotid bruits, or masses Cardiac:irreg irreg; no murmurs, rubs, or gallops  Respiratory:  clear to auscultation bilaterally, normal work of breathing GI: soft, nontender, nondistended, + BS Ext: without cyanosis, clubbing, or edema, Good distal pulses bilaterally Neuro:  Alert and Oriented x 3 Psych: euthymic mood, full affect     Wt Readings from Last 3 Encounters:  05/03/20 213 lb (96.6 kg)  03/22/20 207 lb 3.7 oz (94 kg)  03/22/20 208 lb (94.3 kg)      Studies/Labs Reviewed:   EKG:  EKG is not ordered today.   Recent Labs: No results found for requested labs within last 8760 hours.   Lipid Panel Labs (Brief)  No results found for: CHOL, TRIG, HDL, CHOLHDL, VLDL, LDLCALC, LDLDIRECT    Additional studies/ records that were reviewed today include:       ASSESSMENT:    1. Persistent atrial fibrillation (HCC)   2. Essential hypertension   3. Myasthenia gravis (HCC)      PLAN:  In order of problems listed above:  Atrial  fibrillation with RVR started on atenolol and Eliquis 03/22/20. Plan for echo once HR controlled and possible DCCV.  Heart rate controlled today but patient is not tolerating a atenolol very well.  We will plan to get 2D echo this week and if everything looks okay we will schedule cardioversion for next week.  He has not missed any Eliquis doses.  With his myasthenia gravis and intolerance to many medications if he goes back into A. fib consider referral to A. fib clinic. Cardioversion discussed with patient and he's will to proceed. We'll arrange next week.  Addendum: Echo EF 45-50% no WMA LA normal in size. Needs repeat echo when in NSR. Will schedule DCCV 05/20/20. Check labs  HTN blood pressure controlled  Myasthenia gravis-followed at [facility] and many meds prohibitive because of worsening myasthenia     Medication Adjustments/Labs and Tests Ordered: Current medicines are reviewed at length with the patient today.  Concerns regarding medicines are outlined above.  Medication changes, Labs and Tests ordered today are listed in the Patient Instructions below. Patient Instructions  Medication Instructions:  Your physician recommends that you continue on your current medications as directed. Please refer to the Current Medication list given to you today.  *If you need a refill on your cardiac medications before your next appointment, please call your pharmacy*   Lab Work: NONE   If you have labs (blood work) drawn today and your tests are completely normal, you will receive your results only by:  MyChart Message (if you have MyChart) OR  A paper copy in the mail If you have any lab test that is abnormal or we need to change your treatment, we will call you to review the results.   Testing/Procedures: Your physician has requested that you have an echocardiogram. Echocardiography is a painless test that uses sound waves to create images of your heart. It provides your doctor with  information about the size and shape of your heart and how well your heart's chambers and valves are working. This procedure takes approximately one hour. There are no restrictions for this procedure.     Follow-Up: At [facility], you and your health needs are our priority. As part of our continuing mission to provide you with exceptional heart  care, we have created designated Provider Care Teams. These Care Teams include your primary Cardiologist (physician) and Advanced Practice Providers (APPs -  Physician Assistants and Nurse Practitioners) who all work together to provide you with the care you need, when you need it.  We recommend signing up for the patient portal called "MyChart".  Sign up information is provided on this After Visit Summary.  MyChart is used to connect with patients for Virtual Visits (Telemedicine).  Patients are able to view lab/test results, encounter notes, upcoming appointments, etc.  Non-urgent messages can be sent to your provider as well.   To learn more about what you can do with MyChart, go to [email or web address].    Your next appointment:    Pending test results   The format for your next appointment:   In Person  Provider:   Prentice Docker, MD   Other Instructions Thank you for choosing [facility]!       Elson Clan, PA-C  05/03/2020 2:30 PM    [facility] [address] [place], [place], [place]  [address] Phone: [phone]; Fax: [phone]

## 2020-06-08 NOTE — Anesthesia Postprocedure Evaluation (Signed)
Anesthesia Post Note  Patient: [name]  Procedure(s) Performed: CARDIOVERSION (N/A )  Patient location during evaluation: PACU Anesthesia Type: General Level of consciousness: awake, oriented, awake and alert and patient cooperative Pain management: pain level controlled Vital Signs Assessment: post-procedure vital signs reviewed and stable Respiratory status: spontaneous breathing, respiratory function stable and nonlabored ventilation Cardiovascular status: blood pressure returned to baseline and stable Postop Assessment: no headache and no backache Anesthetic complications: no   No complications documented.   Last Vitals:  Vitals:   06/08/20 1148  BP: 136/64  Pulse: (!) 121  Resp: 15  Temp: 36.6 C  SpO2: 99%    Last Pain:  Vitals:   06/08/20 1148  TempSrc: Oral  PainSc: 0-No pain                 Brynda Peon

## 2020-06-08 NOTE — CV Procedure (Signed)
CV Procedure Note  Procedure: Electrical cardioversion Physician: Dr Dina Rich Indication: afib   Patient brought to the procedure suite after appropriate consent was obtained. Sedation was achieved with the assistance of anesthesiology, please see there note for details. Defib pads were placed in the anterior and posterior positions. He was succesfullyl cardioverted with a single synchronized 200j shock to SR in the 70s. Cardiopulmonary monitoring was performed throughout the procedure, he tolerated well without complications.   Dina Rich MD

## 2020-06-08 NOTE — Telephone Encounter (Signed)
-----   Message from Dyann Kief, PA-C sent at 06/07/2020  9:24 AM EDT ----- Kidney function mildly elevated. Make sure he's staying hydrated. Could be related to cozaar increase. Repeat bmet in 2 weeks. thanks

## 2020-06-08 NOTE — Discharge Instructions (Signed)
Electrical Cardioversion Electrical cardioversion is the delivery of a jolt of electricity to restore a normal rhythm to the heart. A rhythm that is too fast or is not regular keeps the heart from pumping well. In this procedure, sticky patches or metal paddles are placed on the chest to deliver electricity to the heart from a device. This procedure may be done in an emergency if:  There is low or no blood pressure as a result of the heart rhythm.  Normal rhythm must be restored as fast as possible to protect the brain and heart from further damage.  It may save a life. This may also be a scheduled procedure for irregular or fast heart rhythms that are not immediately life-threatening. Tell a health care provider about:  Any allergies you have.  All medicines you are taking, including vitamins, herbs, eye drops, creams, and over-the-counter medicines.  Any problems you or family members have had with anesthetic medicines.  Any blood disorders you have.  Any surgeries you have had.  Any medical conditions you have.  Whether you are pregnant or may be pregnant. What are the risks? Generally, this is a safe procedure. However, problems may occur, including:  Allergic reactions to medicines.  A blood clot that breaks free and travels to other parts of your body.  The possible return of an abnormal heart rhythm within hours or days after the procedure.  Your heart stopping (cardiac arrest). This is rare. What happens before the procedure? Medicines  Your health care provider may have you start taking: ? Blood-thinning medicines (anticoagulants) so your blood does not clot as easily. ? Medicines to help stabilize your heart rate and rhythm.  Ask your health care provider about: ? Changing or stopping your regular medicines. This is especially important if you are taking diabetes medicines or blood thinners. ? Taking medicines such as aspirin and ibuprofen. These medicines can  thin your blood. Do not take these medicines unless your health care provider tells you to take them. ? Taking over-the-counter medicines, vitamins, herbs, and supplements. General instructions  Follow instructions from your health care provider about eating or drinking restrictions.  Plan to have someone take you home from the hospital or clinic.  If you will be going home right after the procedure, plan to have someone with you for 24 hours.  Ask your health care provider what steps will be taken to help prevent infection. These may include washing your skin with a germ-killing soap. What happens during the procedure?   An IV will be inserted into one of your veins.  Sticky patches (electrodes) or metal paddles may be placed on your chest.  You will be given a medicine to help you relax (sedative).  An electrical shock will be delivered. The procedure may vary among health care providers and hospitals. What can I expect after the procedure?  Your blood pressure, heart rate, breathing rate, and blood oxygen level will be monitored until you leave the hospital or clinic.  Your heart rhythm will be watched to make sure it does not change.  You may have some redness on the skin where the shocks were given. Follow these instructions at home:  Do not drive for 24 hours if you were given a sedative during your procedure.  Take over-the-counter and prescription medicines only as told by your health care provider.  Ask your health care provider how to check your pulse. Check it often.  Rest for 48 hours after the procedure or   as told by your health care provider.  Avoid or limit your caffeine use as told by your health care provider.  Keep all follow-up visits as told by your health care provider. This is important. Contact a health care provider if:  You feel like your heart is beating too quickly or your pulse is not regular.  You have a serious muscle cramp that does not go  away. Get help right away if:  You have discomfort in your chest.  You are dizzy or you feel faint.  You have trouble breathing or you are short of breath.  Your speech is slurred.  You have trouble moving an arm or leg on one side of your body.  Your fingers or toes turn cold or blue. Summary  Electrical cardioversion is the delivery of a jolt of electricity to restore a normal rhythm to the heart.  This procedure may be done right away in an emergency or may be a scheduled procedure if the condition is not an emergency.  Generally, this is a safe procedure.  After the procedure, check your pulse often as told by your health care provider. This information is not intended to replace advice given to you by your health care provider. Make sure you discuss any questions you have with your health care provider. Document Revised: 06/09/2019 Document Reviewed: 06/09/2019 Elsevier Patient Education  2020 Elsevier Inc.   Monitored Anesthesia Care, Care After These instructions provide you with information about caring for yourself after your procedure. Your health care provider may also give you more specific instructions. Your treatment has been planned according to current medical practices, but problems sometimes occur. Call your health care provider if you have any problems or questions after your procedure. What can I expect after the procedure? After your procedure, you may:  Feel sleepy for several hours.  Feel clumsy and have poor balance for several hours.  Feel forgetful about what happened after the procedure.  Have poor judgment for several hours.  Feel nauseous or vomit.  Have a sore throat if you had a breathing tube during the procedure. Follow these instructions at home: For at least 24 hours after the procedure:      Have a responsible adult stay with you. It is important to have someone help care for you until you are awake and alert.  Rest as  needed.  Do not: ? Participate in activities in which you could fall or become injured. ? Drive. ? Use heavy machinery. ? Drink alcohol. ? Take sleeping pills or medicines that cause drowsiness. ? Make important decisions or sign legal documents. ? Take care of children on your own. Eating and drinking  Follow the diet that is recommended by your health care provider.  If you vomit, drink water, juice, or soup when you can drink without vomiting.  Make sure you have little or no nausea before eating solid foods. General instructions  Take over-the-counter and prescription medicines only as told by your health care provider.  If you have sleep apnea, surgery and certain medicines can increase your risk for breathing problems. Follow instructions from your health care provider about wearing your sleep device: ? Anytime you are sleeping, including during daytime naps. ? While taking prescription pain medicines, sleeping medicines, or medicines that make you drowsy.  If you smoke, do not smoke without supervision.  Keep all follow-up visits as told by your health care provider. This is important. Contact a health care provider if:  You   keep feeling nauseous or you keep vomiting.  You feel light-headed.  You develop a rash.  You have a fever. Get help right away if:  You have trouble breathing. Summary  For several hours after your procedure, you may feel sleepy and have poor judgment.  Have a responsible adult stay with you for at least 24 hours or until you are awake and alert. This information is not intended to replace advice given to you by your health care provider. Make sure you discuss any questions you have with your health care provider. Document Revised: 02/04/2018 Document Reviewed: 02/27/2016 Elsevier Patient Education  2020 Elsevier Inc.  

## 2020-06-08 NOTE — CV Procedure (Signed)
Electrical Cardioversion Procedure Note [name] [id number] 01/25/1951  Procedure: Electrical Cardioversion Indications:  Atrial fibrillation  Procedure Details Consent: on chart and signed Time Out: Verified patient identification, verified procedure, site/side was marked, verified correct patient position, special equipment/implants available, medications/allergies/relevent history reviewed, required imaging and test results available.  1211  Patient placed on cardiac monitor, pulse oximetry, supplemental oxygen as necessary.  Sedation given: 1210 Pacer pads placed 1210  Cardioverted time(s). 1  Cardioverted at 200 Joules  Evaluation Findings: Post procedure EKG shows: normal sinus rhythm Complications: none Patient did tolerate procedure well.   [name] 06/08/2020, 12:50 PM

## 2020-06-09 ENCOUNTER — Telehealth: Payer: Self-pay | Admitting: Cardiology

## 2020-06-09 ENCOUNTER — Encounter (HOSPITAL_COMMUNITY): Payer: Self-pay | Admitting: Cardiology

## 2020-06-09 NOTE — Telephone Encounter (Signed)
Spoke with pt who would like to know if he should continue to take Atenolol after his cardiovertion.  Pt states he does not like the side effects (impotency) . Please advise.

## 2020-06-09 NOTE — Telephone Encounter (Signed)
New message    Patient wants to know if he is suppose to take the  atenolol (TENORMIN) 50 MG tablet Take 1 tablet (50 mg total) by mouth daily.

## 2020-06-09 NOTE — Telephone Encounter (Signed)
PA appt 1 month please   Dominga Ferry MD

## 2020-06-09 NOTE — Telephone Encounter (Signed)
Wife needs to check their schedule and will call back.

## 2020-06-11 MED ORDER — ATENOLOL 50 MG PO TABS
50.0000 mg | ORAL_TABLET | ORAL | 1 refills | Status: DC | PRN
Start: 2020-06-11 — End: 2020-07-01

## 2020-06-11 NOTE — Addendum Note (Signed)
Addended by: Kerney Elbe on: 06/11/2020 02:06 PM   Modules accepted: Orders

## 2020-06-11 NOTE — Telephone Encounter (Signed)
Pt notified and order changed.

## 2020-06-11 NOTE — Telephone Encounter (Signed)
Can try taking atenolol just as needed for now for palpitations or fast heart rates   J Miryah Ralls MD

## 2020-06-23 ENCOUNTER — Ambulatory Visit (INDEPENDENT_AMBULATORY_CARE_PROVIDER_SITE_OTHER): Payer: Managed Care, Other (non HMO) | Admitting: *Deleted

## 2020-06-23 ENCOUNTER — Other Ambulatory Visit: Payer: Self-pay

## 2020-06-23 ENCOUNTER — Telehealth: Payer: Self-pay | Admitting: Cardiology

## 2020-06-23 DIAGNOSIS — I4819 Other persistent atrial fibrillation: Secondary | ICD-10-CM

## 2020-06-23 NOTE — Telephone Encounter (Signed)
EKG shows back in afib. If he is taking his atenolol just prn would start taking daily. Can we get him an appt in afib clinic, please message me far out that would be. If has been taking atenolol and still not feeling well can increase to 75mg  daily   J Delphina Schum MD

## 2020-06-23 NOTE — Telephone Encounter (Signed)
Pt c/o being dizzy and SOB. Pt states that when he checks his pulse it is irregular. Pt coming in office today for EKG. Please advise.

## 2020-06-23 NOTE — Progress Notes (Signed)
Pt in office for EKG  

## 2020-06-23 NOTE — Telephone Encounter (Signed)
Pt states he recently had an ablation, however he feels he is in afib again.   Please call [phone]    Thanks renee

## 2020-06-23 NOTE — Telephone Encounter (Signed)
Pt notified and states he has not been taking Atenolol at all but will start.

## 2020-06-30 NOTE — Progress Notes (Addendum)
Primary Care Physician: Elfredia Nevins, MD Primary Cardiologist: Dr Wyline Mood Primary Electrophysiologist: none Referring Physician: Dr Storm Frisk [name] is a 69 y.o. male with a history of HTN, DM, myasthenia gravis, hypothyroidism, paroxysmal/ persistent atrial fibrillation who presents for consultation in the [facility].  The patient was initially diagnosed with atrial fibrillation 03/22/20 after presenting with symptoms of chest pain and palpitations. He was started on atenolol for rate control and Eliquis for a CHADS2VASC score of 3. He had an echocardiogram which showed EF 45-50%, no LAE. He underwent DCCV on 06/08/20. Unfortunately, he felt he was back out of rhythm on 06/23/20, confirmed by ECG. On both instances of afib, he was on a prednisone taper for his ears. Today he has symptoms of dyspnea with exertion and dizziness on standing. He has also felt very fatigued since restarting atenolol.   Today, he denies symptoms of chest pain, orthopnea, PND, lower extremity edema, presyncope, syncope, snoring, daytime somnolence, bleeding, or neurologic sequela. The patient is tolerating medications without difficulties and is otherwise without complaint today.    Atrial Fibrillation Risk Factors:  he does not have symptoms or diagnosis of sleep apnea. he does not have a history of rheumatic fever.   he has a BMI of Body mass index is 27.71 kg/m.[name] Filed Weights   07/01/20 0839  Weight: 95.3 kg    Family History  Problem Relation Age of Onset  . Liver disease Father        age 78, deceased  . Colon cancer Neg Hx      Atrial Fibrillation Management history:  Previous antiarrhythmic drugs: none Previous cardioversions: 06/08/20 Previous ablations: none CHADS2VASC score: 3 Anticoagulation history: Eliquis   Past Medical History:  Diagnosis Date  . Candida esophagitis (HCC) OCT 2014  . Deafness in right ear   . Diabetes mellitus without  complication (HCC)   . Difficult intubation   . Esophageal stricture SEP 2011   FOOD IMPACTION-->SAV DIL 16 MM  . GERD (gastroesophageal reflux disease)   . Hypertension   . Kidney stone   . Myasthenia gravis (HCC)   . Sleep apnea   . Thyroid disease    Past Surgical History:  Procedure Laterality Date  . BIOPSY  02/14/2018   Procedure: BIOPSY;  Surgeon: West Bali, MD;  [facility]: [facility];  Service: Endoscopy;;  gastric  . CARDIOVERSION N/A 06/08/2020   Procedure: CARDIOVERSION;  Surgeon: Antoine Poche, MD;  Location: [facility];  Service: Endoscopy;  Laterality: N/A;  . CHOLECYSTECTOMY    . COLONOSCOPY  2005   Dr. Lovell Sheehan  . COLONOSCOPY N/A 07/17/2014   Procedure: COLONOSCOPY;  Surgeon: West Bali, MD;  Location: [facility];  Service: Endoscopy;  Laterality: N/A;  9:30-rescheduled 8/28 same time Soledad Gerlach to notify pt  . ESOPHAGOGASTRODUODENOSCOPY N/A 07/25/2013   Shallow ulcer in the cardia, mid esophageal web, stricture at GE junction  . ESOPHAGOGASTRODUODENOSCOPY N/A 02/14/2018   Procedure: ESOPHAGOGASTRODUODENOSCOPY (EGD);  Surgeon: West Bali, MD;  [facility]: [facility];  Service: Endoscopy;  Laterality: N/A;  11:15am  . ESOPHAGOGASTRODUODENOSCOPY (EGD) WITH ESOPHAGEAL DILATION N/A 08/29/2013   Candida esophagitis, small hiatal hernia, moderate nonerosive gastritis, mid esophageal web  . HERNIA REPAIR     right inguinal  . KIDNEY STONE SURGERY    . NASAL SEPTUM SURGERY    . PALATE SURGERY        . SAVORY DILATION N/A 02/14/2018   Procedure: SAVORY  DILATION;  Surgeon: West Bali, MD;  Location: [facility];  Service: Endoscopy;  Laterality: N/A;  . THYMECTOMY    . TONSILLECTOMY    . UPPER GASTROINTESTINAL ENDOSCOPY  SEP 2011   SAV DIL    Current Outpatient Medications  Medication Sig Dispense Refill  . acyclovir (ZOVIRAX) 400 MG tablet Take 800 mg by mouth daily.     [name] apixaban (ELIQUIS) 5 MG TABS tablet Take 1 tablet (5 mg total) by  mouth 2 (two) times daily. 60 tablet 11  . Ascorbic Acid (VITAMIN C) 1000 MG tablet Take 1,000 mg by mouth daily after lunch.    . azaTHIOprine (IMURAN) 50 MG tablet Take 150 mg by mouth daily.     . Calcium Carbonate-Vitamin D (CALCIUM 600+D) 600-200 MG-UNIT TABS Take 2 tablets by mouth daily after lunch.     . losartan (COZAAR) 50 MG tablet Take 1 tablet (50 mg total) by mouth daily. 90 tablet 1  . metFORMIN (GLUCOPHAGE-XR) 500 MG 24 hr tablet Take 1,000 mg by mouth 2 (two) times daily.     . Omega-3 Fatty Acids (FISH OIL ULTRA) 1400 MG CAPS Take 1,400 mg by mouth daily after lunch.    [name] omeprazole (PRILOSEC) 20 MG capsule TAKE (1) CAPSULE BY MOUTH TWICE DAILY. 180 capsule 3  . Saw Palmetto 450 MG CAPS Take 2,250 mg by mouth daily after lunch.    . SYNTHROID 137 MCG tablet Take 137 mcg by mouth daily before breakfast.     . tamsulosin (FLOMAX) 0.4 MG CAPS capsule Take 0.4 mg by mouth in the morning and at bedtime.     . vitamin E (VITAMIN E) 180 MG (400 UNITS) capsule Take 800 Units by mouth daily after lunch.    . diltiazem (CARDIZEM CD) 120 MG 24 hr capsule Take 1 Capsule Daily 30 capsule 0   No current facility-administered medications for this encounter.    Allergies  Allergen Reactions  . Levofloxacin Anaphylaxis  . Tequin [Gatifloxacin] Anaphylaxis  . Aminoglycosides Other (See Comments)    REACTION: Myasthenia Gravis (MG) Medication Alert  . Avelox [Moxifloxacin Hcl In Nacl] Other (See Comments)    REACTION: FLUOROQUINOLONE ANTIBIOTICS: Myasthenia Gravis (MG) Medication Alert  . Botox [Onabotulinumtoxina] Other (See Comments)    REACTION: Myasthenia Gravis (MG) Medication Alert  . Botulinum Toxins Other (See Comments)    REACTION: Myasthenia Gravis (MG) Medication Alert  . Calcium Channel Blockers Other (See Comments)    (BLOOD PRESSURE MEDICATION) REACTION: Myasthenia Gravis (MG) Medication Alert  . Ciprofloxacin Other (See Comments)    REACTION: FLUOROQUINOLONE  ANTIBIOTICS: Myasthenia Gravis (MG) Medication Alert  . Contrast Media [Iodinated Diagnostic Agents] Other (See Comments)    MD told pt cannot have  . Curare [Tubocurarine] Other (See Comments)    (Usually only used during surgery)  REACTION: Myasthenia Gravis (MG) Medication Alert  . Dysport [Abobotulinumtoxina] Other (See Comments)    REACTION: Myasthenia Gravis (MG) Medication Alert  . Erythromycin Other (See Comments)    REACTION: Myasthenia Gravis (MG) Medication Alert  . Factive [Gemifloxacin] Other (See Comments)    REACTION: FLUOROQUINOLONE ANTIBIOTICS: Myasthenia Gravis (MG) Medication Alert  . Floxin [Ofloxacin] Other (See Comments)    REACTION: FLUOROQUINOLONE ANTIBIOTICS: Myasthenia Gravis (MG) Medication Alert  . Gentamycin [Gentamicin] Other (See Comments)    REACTION:Myasthenia Gravis (MG) Medication Alert  . Interferons Other (See Comments)    REACTION: Myasthenia Gravis (MG) Medication Alert  . Kanamycin Other (See Comments)    REACTION: Myasthenia Gravis (MG)  Medication Alert  . Ketek [Telithromycin] Other (See Comments)    REACTION: Myasthenia Gravis (MG) Medication Alert  . Levaquin [Levofloxacin In D5w] Other (See Comments)    REACTION: FLUOROQUINOLONE ANTIBIOTICS: Myasthenia Gravis (MG) Medication Alert  . Limonene Other (See Comments)    REACTION:Myasthenia Gravis (MG) Medication Alert  . Macrolides And Ketolides Other (See Comments)    REACTION: Myasthenia Gravis (MG) Medication Alert REACTION: Myasthenia Gravis (MG) Medication Alert  . Magnesium-Containing Compounds Other (See Comments)    Unknown  . Myobloc [Rimabotulinumtoxinb] Other (See Comments)    REACTION: Myasthenia Gravis (MG) Medication Alert  . Neomycin Other (See Comments)    REACTION: Myasthenia Gravis (MG) Medication Alert  . Noroxin [Norfloxacin] Other (See Comments)    REACTION: FLUOROQUINOLONE ANTIBIOTICS: Myasthenia Gravis (MG) Medication Alert  . Other Other (See Comments)    MAGNESIUM  SALTS: Milk of Magnesia, some antacids (Maalox, Mylanta) REACTION: Myasthenia Gravis (MG) Medication Alerta  . Penicillamine Other (See Comments)    D-PENICILLAMINE *DO NOT CONFUSE WITH PENICILLIN*  REACTION: Myasthenia Gravis (MG) Medication Alert  . Procainamide Other (See Comments)    REACTION: Myasthenia Gravis (MG) Medication Alert  . Propranolol Other (See Comments)    REACTION: Myasthenia Gravis (MG) Medication Alert  . Quinidine Other (See Comments)    REACTION: Myasthenia Gravis (MG) Medication Alert  . Quinine Derivatives Other (See Comments)    REACTION: Myasthenia Gravis (MG) Medication Alert  . Streptomycin Other (See Comments)    REACTION:  Myasthenia Gravis (MG) Medication Alert  . Timolol Other (See Comments)    REACTION: Myasthenia Gravis (MG) Medication Alert  . Tobramycin Other (See Comments)    REACTION: Myasthenia Gravis (MG) Medication Alert  . Xeomin [Incobotulinumtoxina] Other (See Comments)    REACTION: Myasthenia Gravis (MG) Medication Alert  . Zithromax [Azithromycin] Other (See Comments)    REACTION: Myasthenia Gravis (MG) Medication Alert    Social History   Socioeconomic History  . Marital status: Married    Spouse name: Not on file  . Number of children: Not on file  . Years of education: Not on file  . Highest education level: Not on file  Occupational History  . Not on file  Tobacco Use  . Smoking status: Never Smoker  . Smokeless tobacco: Never Used  Vaping Use  . Vaping Use: Never used  Substance and Sexual Activity  . Alcohol use: No  . Drug use: No  . Sexual activity: Yes    Partners: Female    Birth control/protection: None    Comment: spouse  Other Topics Concern  . Not on file  Social History Narrative  . Not on file   Social Determinants of Health   Financial Resource Strain:   . Difficulty of Paying Living Expenses:   Food Insecurity:   . Worried About Programme researcher, broadcasting/film/video in the Last Year:   . Barista in the  Last Year:   Transportation Needs:   . Freight forwarder (Medical):   [name] Lack of Transportation (Non-Medical):   Physical Activity:   . Days of Exercise per Week:   . Minutes of Exercise per Session:   Stress:   . Feeling of Stress :   Social Connections:   . Frequency of Communication with Friends and Family:   . Frequency of Social Gatherings with Friends and Family:   . Attends Religious Services:   . Active Member of Clubs or Organizations:   . Attends Banker Meetings:   .  Marital Status:   Intimate Partner Violence:   . Fear of Current or Ex-Partner:   . Emotionally Abused:   [name] Physically Abused:   . Sexually Abused:      ROS- All systems are reviewed and negative except as per the HPI above.  Physical Exam: Vitals:   07/01/20 0839  BP: 112/82  Pulse: 99  Weight: 95.3 kg  Height: 6\' 1"  (1.854 m)    GEN- The patient is well appearing, alert and oriented x 3 today.   Head- normocephalic, atraumatic Eyes-  Sclera clear, conjunctiva pink Ears- hearing intact Oropharynx- clear Neck- supple  Lungs- Clear to ausculation bilaterally, normal work of breathing Heart- irregular rate and rhythm, no murmurs, rubs or gallops  GI- soft, NT, ND, + BS Extremities- no clubbing, cyanosis, or edema MS- no significant deformity or atrophy Skin- no rash or lesion Psych- euthymic mood, full affect Neuro- strength and sensation are intact  Wt Readings from Last 3 Encounters:  07/01/20 95.3 kg  05/03/20 96.6 kg  03/22/20 94 kg    EKG today demonstrates afib HR 99, QRS 86, QTc 436  Echo 05/11/20 demonstrated  1. Patient in afib during study diffuse hypokinesis worse in inferior  base consider repeating study when patient in NSR . Left ventricular  ejection fraction, by estimation, is 45 to 50%. The left ventricle has  mildly decreased function. The left  ventricle has no regional wall motion abnormalities. Left ventricular  diastolic parameters are  indeterminate.  2. Right ventricular systolic function is normal. The right ventricular  size is normal.  3. Right atrial size was mildly dilated.  4. The mitral valve is normal in structure. Mild mitral valve  regurgitation. No evidence of mitral stenosis.  5. The aortic valve is tricuspid. Aortic valve regurgitation is mild.  Mild aortic valve sclerosis is present, with no evidence of aortic valve  stenosis.  6. The inferior vena cava is normal in size with greater than 50%  respiratory variability, suggesting right atrial pressure of 3 mmHg.   Epic records are reviewed at length today  CHA2DS2-VASc Score = 3  The patient's score is based upon: CHF History: 0 HTN History: 1 Age : 1 Diabetes History: 1 Stroke History: 0 Vascular Disease History: 0 Gender: 0      ASSESSMENT AND PLAN: 1. Persistent Atrial Fibrillation (ICD10:  I48.19) The patient's CHA2DS2-VASc score is 3, indicating a 3.2% annual risk of stroke.   Medication options limited by myasthenia gravis. He is not tolerating atenolol.  D/w EP. Will stop atenolol and start diltiazem 120 mg daily for rate control. Will reach out to neurologist to discuss if patient would be able to tolerate anesthesia for ablation. Also considering dofetilide. Would avoid class 1C and Multaq given reduced EF.  2. Secondary Hypercoagulable State (ICD10:  D68.69) The patient is at significant risk for stroke/thromboembolism based upon his CHA2DS2-VASc Score of 3.  Continue Apixaban (Eliquis).   3. HTN Stable, med changes as above.   Follow up in the AF clinic next week.    Addendum: Discussed case with neurology fellow at [facility]. Patient felt to be stable for general anesthesia for ablation. Recommended avoiding paralytics or neuromuscular blocking agents. After discussing dofetilide and ablation with patient, he would like to be considered for ablation. D/w Dr [facility], will refer.   Lalla Brothers PA-C Afib Clinic [address] [facility] [address] [place], [place] [phone] 07/01/2020 4:12 PM

## 2020-07-01 ENCOUNTER — Other Ambulatory Visit: Payer: Self-pay

## 2020-07-01 ENCOUNTER — Ambulatory Visit (HOSPITAL_COMMUNITY)
Admission: RE | Admit: 2020-07-01 | Discharge: 2020-07-01 | Disposition: A | Discharge status: Home / Self Care | Payer: Managed Care, Other (non HMO) | Source: Ambulatory Visit | Attending: Physician Assistant | Admitting: Physician Assistant

## 2020-07-01 ENCOUNTER — Encounter (HOSPITAL_COMMUNITY): Payer: Self-pay | Admitting: Physician Assistant

## 2020-07-01 VITALS — BP 112/82 | HR 99 | Ht 73.0 in | Wt 210.0 lb

## 2020-07-01 DIAGNOSIS — Z79890 Hormone replacement therapy (postmenopausal): Secondary | ICD-10-CM | POA: Insufficient documentation

## 2020-07-01 DIAGNOSIS — I10 Essential (primary) hypertension: Secondary | ICD-10-CM | POA: Diagnosis not present

## 2020-07-01 DIAGNOSIS — I4819 Other persistent atrial fibrillation: Secondary | ICD-10-CM | POA: Diagnosis present

## 2020-07-01 DIAGNOSIS — E119 Type 2 diabetes mellitus without complications: Secondary | ICD-10-CM | POA: Diagnosis not present

## 2020-07-01 DIAGNOSIS — K219 Gastro-esophageal reflux disease without esophagitis: Secondary | ICD-10-CM | POA: Diagnosis not present

## 2020-07-01 DIAGNOSIS — Z881 Allergy status to other antibiotic agents status: Secondary | ICD-10-CM | POA: Insufficient documentation

## 2020-07-01 DIAGNOSIS — G7000 Myasthenia gravis without (acute) exacerbation: Secondary | ICD-10-CM | POA: Diagnosis not present

## 2020-07-01 DIAGNOSIS — D6869 Other thrombophilia: Secondary | ICD-10-CM | POA: Insufficient documentation

## 2020-07-01 DIAGNOSIS — Z79899 Other long term (current) drug therapy: Secondary | ICD-10-CM | POA: Insufficient documentation

## 2020-07-01 DIAGNOSIS — E039 Hypothyroidism, unspecified: Secondary | ICD-10-CM | POA: Insufficient documentation

## 2020-07-01 DIAGNOSIS — Z7984 Long term (current) use of oral hypoglycemic drugs: Secondary | ICD-10-CM | POA: Insufficient documentation

## 2020-07-01 DIAGNOSIS — Z9049 Acquired absence of other specified parts of digestive tract: Secondary | ICD-10-CM | POA: Insufficient documentation

## 2020-07-01 DIAGNOSIS — Z888 Allergy status to other drugs, medicaments and biological substances status: Secondary | ICD-10-CM | POA: Diagnosis not present

## 2020-07-01 DIAGNOSIS — Z87442 Personal history of urinary calculi: Secondary | ICD-10-CM | POA: Insufficient documentation

## 2020-07-01 DIAGNOSIS — Z7901 Long term (current) use of anticoagulants: Secondary | ICD-10-CM | POA: Insufficient documentation

## 2020-07-01 MED ORDER — DILTIAZEM HCL ER COATED BEADS 120 MG PO CP24
ORAL_CAPSULE | ORAL | 0 refills | Status: DC
Start: 2020-07-01 — End: 2020-07-30

## 2020-07-06 ENCOUNTER — Other Ambulatory Visit (HOSPITAL_COMMUNITY): Payer: Self-pay | Admitting: *Deleted

## 2020-07-06 DIAGNOSIS — I4819 Other persistent atrial fibrillation: Secondary | ICD-10-CM

## 2020-07-06 NOTE — Addendum Note (Signed)
Encounter addended by: Danice Goltz, PA on: 07/06/2020 10:31 AM  Actions taken: Clinical Note Signed

## 2020-07-07 ENCOUNTER — Ambulatory Visit (HOSPITAL_COMMUNITY): Payer: Managed Care, Other (non HMO) | Admitting: Physician Assistant

## 2020-07-17 NOTE — H&P (View-Only) (Signed)
Electrophysiology Office Note:    Date:  07/19/2020   ID:  [name], DOB 1951-03-09, MRN [id number]  PCP:  Elfredia Nevins, MD  [facility] Cardiologist:  Prentice Docker, MD (Inactive)  [facility] Electrophysiologist:  Lanier Prude, MD   Referring MD: Danice Goltz, [place]   Chief Complaint: Atrial fibrillation  History of Present Illness:    [name] is a 69 y.o. male with a hx of persistent atrial fibrillation on Eliquis, HTN, DM, myasthenia gravis, hypothyroidism who presents to the clinic to discuss management options for his AF. His AF was diagnosed in 03/2020 and was initially managed with atenolol and eliquis for a CHADSVASC of 3. Echo showed a mildly decreased ejection fraction. He is symptomatic while in AF with dyspnea with exertion and dizziness and fatigue. He tells me that he felt significantly better for the 8 days he was in normal rhythm. He can walk to the mailbox and back while in AF and feel winded.  His myasthenia is well controlled and is followed closely by the [facility] neurology clinic. His case was recently discussed with Lynder Parents, DO who is involved in his myasthenia care. Dr Artist Beach advised that "general anesthesia should be OK for a possible ablation procedure if needed, but that neuromuscular blocking agents should be avoided" (telephone encounter 07/02/2020).  Past Medical History:  Diagnosis Date  . Candida esophagitis (HCC) OCT 2014  . Deafness in right ear   . Diabetes mellitus without complication (HCC)   . Difficult intubation   . Esophageal stricture SEP 2011   FOOD IMPACTION-->SAV DIL 16 MM  . GERD (gastroesophageal reflux disease)   . Hypertension   . Kidney stone   . Myasthenia gravis (HCC)   . Sleep apnea   . Thyroid disease     Past Surgical History:  Procedure Laterality Date  . BIOPSY  02/14/2018   Procedure: BIOPSY;  Surgeon: West Bali, MD;  [facility];  Service: Endoscopy;;  gastric  .  CARDIOVERSION N/A 06/08/2020   Procedure: CARDIOVERSION;  Surgeon: Antoine Poche, MD;  [facility];  Service: Endoscopy;  Laterality: N/A;  . CHOLECYSTECTOMY    . COLONOSCOPY  2005   Dr. Lovell Sheehan  . COLONOSCOPY N/A 07/17/2014   Procedure: COLONOSCOPY;  Surgeon: West Bali, MD;  [facility];  Service: Endoscopy;  Laterality: N/A;  9:30-rescheduled 8/28 same time Soledad Gerlach to notify pt  . ESOPHAGOGASTRODUODENOSCOPY N/A 07/25/2013   Shallow ulcer in the cardia, mid esophageal web, stricture at GE junction  . ESOPHAGOGASTRODUODENOSCOPY N/A 02/14/2018   Procedure: ESOPHAGOGASTRODUODENOSCOPY (EGD);  Surgeon: West Bali, MD;  [facility];  Service: Endoscopy;  Laterality: N/A;  11:15am  . ESOPHAGOGASTRODUODENOSCOPY (EGD) WITH ESOPHAGEAL DILATION N/A 08/29/2013   Candida esophagitis, small hiatal hernia, moderate nonerosive gastritis, mid esophageal web  . HERNIA REPAIR     right inguinal  . KIDNEY STONE SURGERY    . NASAL SEPTUM SURGERY    . PALATE SURGERY        . SAVORY DILATION N/A 02/14/2018   Procedure: SAVORY DILATION;  Surgeon: West Bali, MD;  [facility];  Service: Endoscopy;  Laterality: N/A;  . THYMECTOMY    . TONSILLECTOMY    . UPPER GASTROINTESTINAL ENDOSCOPY  SEP 2011   SAV DIL    Current Medications: Current Meds  Medication Sig  . acyclovir (ZOVIRAX) 400 MG tablet Take 800 mg by mouth daily.   [name] apixaban (ELIQUIS)  5 MG TABS tablet Take 1 tablet (5 mg total) by mouth 2 (two) times daily.  . Ascorbic Acid (VITAMIN C) 1000 MG tablet Take 1,000 mg by mouth daily after lunch.  . azaTHIOprine (IMURAN) 50 MG tablet Take 150 mg by mouth daily.   . Calcium Carbonate-Vitamin D (CALCIUM 600+D) 600-200 MG-UNIT TABS Take 2 tablets by mouth daily after lunch.   . diltiazem (CARDIZEM CD) 120 MG 24 hr capsule Take 1 Capsule Daily  . metFORMIN (GLUCOPHAGE-XR) 500 MG 24 hr tablet Take 1,000 mg by mouth 2 (two) times daily.   .  Omega-3 Fatty Acids (FISH OIL ULTRA) 1400 MG CAPS Take 1,400 mg by mouth daily after lunch.  [name] omeprazole (PRILOSEC) 20 MG capsule TAKE (1) CAPSULE BY MOUTH TWICE DAILY.  . Saw Palmetto 450 MG CAPS Take 2,250 mg by mouth daily after lunch.  . SYNTHROID 137 MCG tablet Take 137 mcg by mouth daily before breakfast.   . tamsulosin (FLOMAX) 0.4 MG CAPS capsule Take 0.4 mg by mouth in the morning and at bedtime.   . vitamin E (VITAMIN E) 180 MG (400 UNITS) capsule Take 800 Units by mouth daily after lunch.     Allergies:   Levofloxacin, Tequin [gatifloxacin], Aminoglycosides, Avelox [moxifloxacin hcl in nacl], Botox [onabotulinumtoxina], Botulinum toxins, Calcium channel blockers, Ciprofloxacin, Contrast media [iodinated diagnostic agents], Curare [tubocurarine], Dysport [abobotulinumtoxina], Erythromycin, Factive [gemifloxacin], Floxin [ofloxacin], Gentamycin [gentamicin], Interferons, Kanamycin, Ketek [telithromycin], Levaquin [levofloxacin in d5w], Limonene, Macrolides and ketolides, Magnesium-containing compounds, Myobloc [rimabotulinumtoxinb], Neomycin, Noroxin [norfloxacin], Other, Penicillamine, Procainamide, Propranolol, Quinidine, Quinine derivatives, Streptomycin, Timolol, Tobramycin, Xeomin [incobotulinumtoxina], and Zithromax [azithromycin]   Social History   Socioeconomic History  . Marital status: Married    Spouse name: Not on file  . Number of children: Not on file  . Years of education: Not on file  . Highest education level: Not on file  Occupational History  . Not on file  Tobacco Use  . Smoking status: Never Smoker  . Smokeless tobacco: Never Used  Vaping Use  . Vaping Use: Never used  Substance and Sexual Activity  . Alcohol use: No  . Drug use: No  . Sexual activity: Yes    Partners: Female    Birth control/protection: None    Comment: spouse  Other Topics Concern  . Not on file  Social History Narrative  . Not on file   Social Determinants of Health   Financial  Resource Strain:   . Difficulty of Paying Living Expenses: Not on file  Food Insecurity:   . Worried About Programme researcher, broadcasting/film/video in the Last Year: Not on file  . Ran Out of Food in the Last Year: Not on file  Transportation Needs:   . Lack of Transportation (Medical): Not on file  . Lack of Transportation (Non-Medical): Not on file  Physical Activity:   . Days of Exercise per Week: Not on file  . Minutes of Exercise per Session: Not on file  Stress:   . Feeling of Stress : Not on file  Social Connections:   . Frequency of Communication with Friends and Family: Not on file  . Frequency of Social Gatherings with Friends and Family: Not on file  . Attends Religious Services: Not on file  . Active Member of Clubs or Organizations: Not on file  . Attends Banker Meetings: Not on file  . Marital Status: Not on file     Family History: The patient's family history includes Liver disease in his  father. There is no history of Colon cancer.  ROS:   Please see the history of present illness.    All other systems reviewed and are negative.  EKGs/Labs/Other Studies Reviewed:    The following studies were reviewed today: ECG, echo  EKG:  The ekg ordered today demonstrates atrial fibrillation  05/11/2020 Echo: 1. Patient in afib during study diffuse hypokinesis worse in inferior  base consider repeating study when patient in NSR . Left ventricular  ejection fraction, by estimation, is 45 to 50%. The left ventricle has  mildly decreased function. The left  ventricle has no regional wall motion abnormalities. Left ventricular  diastolic parameters are indeterminate.  2. Right ventricular systolic function is normal. The right ventricular  size is normal.  3. Right atrial size was mildly dilated.  4. The mitral valve is normal in structure. Mild mitral valve  regurgitation. No evidence of mitral stenosis.  5. The aortic valve is tricuspid. Aortic valve regurgitation is  mild.  Mild aortic valve sclerosis is present, with no evidence of aortic valve  stenosis.  6. The inferior vena cava is normal in size with greater than 50%  respiratory variability, suggesting right atrial pressure of 3 mmHg.   Recent Labs: 06/04/2020: BUN 16; Creatinine, Ser 1.29; Hemoglobin 13.8; Platelets 215; Potassium 4.2; Sodium 139  Recent Lipid Panel No results found for: CHOL, TRIG, HDL, CHOLHDL, VLDL, LDLCALC, LDLDIRECT  Physical Exam:    VS:  BP 128/70   Pulse (!) 103   Ht 6\' 1"  (1.854 m)   Wt 215 lb (97.5 kg)   SpO2 98%   BMI 28.37 kg/m     Wt Readings from Last 3 Encounters:  07/19/20 215 lb (97.5 kg)  07/01/20 210 lb (95.3 kg)  05/03/20 213 lb (96.6 kg)     GEN: Well nourished, well developed in no acute distress HEENT: Normal NECK: No JVD; No carotid bruits LYMPHATICS: No lymphadenopathy CARDIAC: irregularly irregular, no murmurs, rubs, gallops RESPIRATORY:  Clear to auscultation without rales, wheezing or rhonchi  ABDOMEN: Soft, non-tender, non-distended MUSCULOSKELETAL:  No edema; No deformity  SKIN: Warm and dry NEUROLOGIC:  Alert and oriented x 3 PSYCHIATRIC:  Normal affect   ASSESSMENT:    1. Persistent atrial fibrillation (HCC)   2. Myasthenia gravis (HCC)    PLAN:    In order of problems listed above:  1. Persistent atrial fibrillation Symptomatic with decreased LV function and patient prefers to avoid atenolol given off target effects. Was recently taken off atenolol and started on diltiazem. He wants to eventually be off of the diltiazem. Given his symptomatic AF and intolerance of medical therapy I think ablation is a very reasonable strategy to help maintain sinus rhythm.  Ablation strategy is PVI, posterior wall isolation.  Risk, benefits, and alternatives to EP study and radiofrequency ablation for afib were also discussed in detail today. These risks include but are not limited to stroke, bleeding, vascular damage, tamponade,  perforation, damage to the esophagus, lungs, and other structures, pulmonary vein stenosis, worsening renal function, and death. The patient understands these risk and wishes to proceed.  We will therefore proceed with catheter ablation at the next available time.  Carto, ICE, anesthesia are requested for the procedure.  Will also obtain TEE prior to the procedure to exclude LAA thrombus and further evaluate atrial anatomy.  2. Myasthenia Gravis Followed by [facility] neuromuscular clinic. Has an appointment next week. Well controlled. Will need to avoid paralytics during general anaesthesia.  Medication Adjustments/Labs and Tests Ordered: Current medicines are reviewed at length with the patient today.  Concerns regarding medicines are outlined above.  Orders Placed This Encounter  Procedures  . EKG 12-Lead   No orders of the defined types were placed in this encounter.   Patient Instructions  Medication Instructions:  Your physician recommends that you continue on your current medications as directed. Please refer to the Current Medication list given to you today.  Labwork: None ordered.  Testing/Procedures: None ordered.  Follow-Up: Your physician wants you to follow-up in: one year with Dr. Lalla Brothers.   You will receive a reminder letter in the mail two months in advance. If you don't receive a letter, please call our office to schedule the follow-up appointment.   Any Other Special Instructions Will Be Listed Below (If Applicable).  If you need a refill on your cardiac medications before your next appointment, please call your pharmacy.      Signed, Lanier Prude, MD  07/19/2020 8:24 AM    [facility]    Anticoagulation instructions: Pt to hold Eliquis for 1 dose(s) prior to procedure and we will plan to resume the day of the procedure unless otherwise instructed after surgery.  Medication instructions morning of: The patient should hold ALL  medications the morning of the procedure   Discharge: Our plan will be to discharge the patient same day after a period of observation   Patient has a documented dye allergy but it is related to his myasthenia rather than an anaphylactic reaction. He does NOT need premedication as we will NOT plan to use contrast.

## 2020-07-17 NOTE — H&P (View-Only) (Signed)
Electrophysiology Office Note:    Date:  07/19/2020   ID:  [name], DOB 1951-03-09, MRN [id number]  PCP:  Corey Nevins, MD  [name] Cardiologist:  Corey Docker, MD (Inactive)  [facility] Electrophysiologist:  Corey Prude, MD   Referring MD: [name], [place]   Chief Complaint: Atrial fibrillation  History of Present Illness:    [name] is a 69 y.o. male with a hx of persistent atrial fibrillation on Eliquis, HTN, DM, myasthenia gravis, hypothyroidism who presents to the clinic to discuss management options for his AF. His AF was diagnosed in 03/2020 and was initially managed with atenolol and eliquis for a CHADSVASC of 3. Echo showed a mildly decreased ejection fraction. He is symptomatic while in AF with dyspnea with exertion and dizziness and fatigue. He tells me that he felt significantly better for the 8 days he was in normal rhythm. He can walk to the mailbox and back while in AF and feel winded.  His myasthenia is well controlled and is followed closely by the [facility] neurology clinic. His case was recently discussed with Corey Parents, DO who is involved in his myasthenia care. Dr [name] advised that "[name] should be OK for a possible ablation procedure if needed, but that neuromuscular blocking agents should be avoided" (telephone encounter 07/02/2020).  Past Medical History:  Diagnosis Date  . Candida esophagitis (HCC) OCT 2014  . Deafness in right ear   . Diabetes mellitus without complication (HCC)   . Difficult intubation   . Esophageal stricture SEP 2011   FOOD IMPACTION-->SAV DIL 16 MM  . GERD (gastroesophageal reflux disease)   . Hypertension   . Kidney stone   . Myasthenia gravis (HCC)   . Sleep apnea   . Thyroid disease     Past Surgical History:  Procedure Laterality Date  . BIOPSY  02/14/2018   Procedure: BIOPSY;  Surgeon: Corey Bali, MD;  [facility];  Service: Endoscopy;;  gastric  .  CARDIOVERSION N/A 06/08/2020   Procedure: CARDIOVERSION;  Surgeon: Corey Poche, MD;  [facility];  Service: Endoscopy;  Laterality: N/A;  . CHOLECYSTECTOMY    . COLONOSCOPY  2005   Dr. Lovell Hernandez  . COLONOSCOPY N/A 07/17/2014   Procedure: COLONOSCOPY;  Surgeon: Corey Bali, MD;  [facility];  Service: Endoscopy;  Laterality: N/A;  9:30-rescheduled 8/28 same time [name] to notify pt  . ESOPHAGOGASTRODUODENOSCOPY N/A 07/25/2013   Shallow ulcer in the cardia, mid esophageal web, stricture at GE junction  . ESOPHAGOGASTRODUODENOSCOPY N/A 02/14/2018   Procedure: ESOPHAGOGASTRODUODENOSCOPY (EGD);  Surgeon: Corey Bali, MD;  [facility];  Service: Endoscopy;  Laterality: N/A;  11:15am  . ESOPHAGOGASTRODUODENOSCOPY (EGD) WITH ESOPHAGEAL DILATION N/A 08/29/2013   Candida esophagitis, small hiatal hernia, moderate nonerosive gastritis, mid esophageal web  . HERNIA REPAIR     right inguinal  . KIDNEY STONE SURGERY    . NASAL SEPTUM SURGERY    . PALATE SURGERY        . SAVORY DILATION N/A 02/14/2018   Procedure: SAVORY DILATION;  Surgeon: Corey Bali, MD;  [facility];  Service: Endoscopy;  Laterality: N/A;  . THYMECTOMY    . TONSILLECTOMY    . UPPER GASTROINTESTINAL ENDOSCOPY  SEP 2011   SAV DIL    Current Medications: Current Meds  Medication Sig  . acyclovir (ZOVIRAX) 400 MG tablet Take 800 mg by mouth daily.   [name] apixaban (ELIQUIS)  5 MG TABS tablet Take 1 tablet (5 mg total) by mouth 2 (two) times daily.  . Ascorbic Acid (VITAMIN C) 1000 MG tablet Take 1,000 mg by mouth daily after lunch.  . azaTHIOprine (IMURAN) 50 MG tablet Take 150 mg by mouth daily.   . Calcium Carbonate-Vitamin D (CALCIUM 600+D) 600-200 MG-UNIT TABS Take 2 tablets by mouth daily after lunch.   . diltiazem (CARDIZEM CD) 120 MG 24 hr capsule Take 1 Capsule Daily  . metFORMIN (GLUCOPHAGE-XR) 500 MG 24 hr tablet Take 1,000 mg by mouth 2 (two) times daily.   .  Omega-3 Fatty Acids (FISH OIL ULTRA) 1400 MG CAPS Take 1,400 mg by mouth daily after lunch.  [name] omeprazole (PRILOSEC) 20 MG capsule TAKE (1) CAPSULE BY MOUTH TWICE DAILY.  . Saw Palmetto 450 MG CAPS Take 2,250 mg by mouth daily after lunch.  . SYNTHROID 137 MCG tablet Take 137 mcg by mouth daily before breakfast.   . tamsulosin (FLOMAX) 0.4 MG CAPS capsule Take 0.4 mg by mouth in the morning and at bedtime.   . vitamin E (VITAMIN E) 180 MG (400 UNITS) capsule Take 800 Units by mouth daily after lunch.     Allergies:   Levofloxacin, Tequin [gatifloxacin], Aminoglycosides, Avelox [moxifloxacin hcl in nacl], Botox [onabotulinumtoxina], Botulinum toxins, Calcium channel blockers, Ciprofloxacin, Contrast media [iodinated diagnostic agents], Curare [tubocurarine], Dysport [abobotulinumtoxina], Erythromycin, Factive [gemifloxacin], Floxin [ofloxacin], Gentamycin [gentamicin], Interferons, Kanamycin, Ketek [telithromycin], Levaquin [levofloxacin in d5w], Limonene, Macrolides and ketolides, Magnesium-containing compounds, Myobloc [rimabotulinumtoxinb], Neomycin, Noroxin [norfloxacin], Other, Penicillamine, Procainamide, Propranolol, Quinidine, Quinine derivatives, Streptomycin, Timolol, Tobramycin, Xeomin [incobotulinumtoxina], and Zithromax [azithromycin]   Social History   Socioeconomic History  . Marital status: Married    Spouse name: Not on file  . Number of children: Not on file  . Years of education: Not on file  . Highest education level: Not on file  Occupational History  . Not on file  Tobacco Use  . Smoking status: Never Smoker  . Smokeless tobacco: Never Used  Vaping Use  . Vaping Use: Never used  Substance and Sexual Activity  . Alcohol use: No  . Drug use: No  . Sexual activity: Yes    Partners: Female    Birth control/protection: None    Comment: spouse  Other Topics Concern  . Not on file  Social History Narrative  . Not on file   Social Determinants of Health   Financial  Resource Strain:   . Difficulty of Paying Living Expenses: Not on file  Food Insecurity:   . Worried About Programme researcher, broadcasting/film/video in the Last Year: Not on file  . Ran Out of Food in the Last Year: Not on file  Transportation Needs:   . Lack of Transportation (Medical): Not on file  . Lack of Transportation (Non-Medical): Not on file  Physical Activity:   . Days of Exercise per Week: Not on file  . Minutes of Exercise per Session: Not on file  Stress:   . Feeling of Stress : Not on file  Social Connections:   . Frequency of Communication with Friends and Family: Not on file  . Frequency of Social Gatherings with Friends and Family: Not on file  . Attends Religious Services: Not on file  . Active Member of Clubs or Organizations: Not on file  . Attends Banker Meetings: Not on file  . Marital Status: Not on file     Family History: The patient's family history includes Liver disease in his  father. There is no history of Colon cancer.  ROS:   Please see the history of present illness.    All other systems reviewed and are negative.  EKGs/Labs/Other Studies Reviewed:    The following studies were reviewed today: ECG, echo  EKG:  The ekg ordered today demonstrates atrial fibrillation  05/11/2020 Echo: 1. Patient in afib during study diffuse hypokinesis worse in inferior  base consider repeating study when patient in NSR . Left ventricular  ejection fraction, by estimation, is 45 to 50%. The left ventricle has  mildly decreased function. The left  ventricle has no regional wall motion abnormalities. Left ventricular  diastolic parameters are indeterminate.  2. Right ventricular systolic function is normal. The right ventricular  size is normal.  3. Right atrial size was mildly dilated.  4. The mitral valve is normal in structure. Mild mitral valve  regurgitation. No evidence of mitral stenosis.  5. The aortic valve is tricuspid. Aortic valve regurgitation is  mild.  Mild aortic valve sclerosis is present, with no evidence of aortic valve  stenosis.  6. The inferior vena cava is normal in size with greater than 50%  respiratory variability, suggesting right atrial pressure of 3 mmHg.   Recent Labs: 06/04/2020: BUN 16; Creatinine, Ser 1.29; Hemoglobin 13.8; Platelets 215; Potassium 4.2; Sodium 139  Recent Lipid Panel No results found for: CHOL, TRIG, HDL, CHOLHDL, VLDL, LDLCALC, LDLDIRECT  Physical Exam:    VS:  BP 128/70   Pulse (!) 103   Ht 6\' 1"  (1.854 m)   Wt 215 lb (97.5 kg)   SpO2 98%   BMI 28.37 kg/m     Wt Readings from Last 3 Encounters:  07/19/20 215 lb (97.5 kg)  07/01/20 210 lb (95.3 kg)  05/03/20 213 lb (96.6 kg)     GEN: Well nourished, well developed in no acute distress HEENT: Normal NECK: No JVD; No carotid bruits LYMPHATICS: No lymphadenopathy CARDIAC: irregularly irregular, no murmurs, rubs, gallops RESPIRATORY:  Clear to auscultation without rales, wheezing or rhonchi  ABDOMEN: Soft, non-tender, non-distended MUSCULOSKELETAL:  No edema; No deformity  SKIN: Warm and dry NEUROLOGIC:  Alert and oriented x 3 PSYCHIATRIC:  Normal affect   ASSESSMENT:    1. Persistent atrial fibrillation (HCC)   2. Myasthenia gravis (HCC)    PLAN:    In order of problems listed above:  1. Persistent atrial fibrillation Symptomatic with decreased LV function and patient prefers to avoid atenolol given off target effects. Was recently taken off atenolol and started on diltiazem. He wants to eventually be off of the diltiazem. Given his symptomatic AF and intolerance of medical therapy I think ablation is a very reasonable strategy to help maintain sinus rhythm.  Ablation strategy is PVI, posterior wall isolation.  Risk, benefits, and alternatives to EP study and radiofrequency ablation for afib were also discussed in detail today. These risks include but are not limited to stroke, bleeding, vascular damage, tamponade,  perforation, damage to the esophagus, lungs, and other structures, pulmonary vein stenosis, worsening renal function, and death. The patient understands these risk and wishes to proceed.  We will therefore proceed with catheter ablation at the next available time.  Carto, ICE, Hernandez are requested for the procedure.  Will also obtain TEE prior to the procedure to exclude LAA thrombus and further evaluate atrial anatomy.  2. Myasthenia Gravis Followed by [facility] neuromuscular clinic. Has an appointment next week. Well controlled. Will need to avoid paralytics during Corey anaesthesia.  Medication Adjustments/Labs and Tests Ordered: Current medicines are reviewed at length with the patient today.  Concerns regarding medicines are outlined above.  Orders Placed This Encounter  Procedures  . EKG 12-Lead   No orders of the defined types were placed in this encounter.   Patient Instructions  Medication Instructions:  Your physician recommends that you continue on your current medications as directed. Please refer to the Current Medication list given to you today.  Labwork: None ordered.  Testing/Procedures: None ordered.  Follow-Up: Your physician wants you to follow-up in: one year with Dr. Lavante Toso.   You will receive a reminder letter in the mail two months in advance. If you don't receive a letter, please call our office to schedule the follow-up appointment.   Any Other Special Instructions Will Be Listed Below (If Applicable).  If you need a refill on your cardiac medications before your next appointment, please call your pharmacy.      Signed, Phat Dalton T Kourtnei Rauber, MD  07/19/2020 8:24 AM    [facility]    Anticoagulation instructions: Pt to hold Eliquis for 1 dose(s) prior to procedure and we will plan to resume the day of the procedure unless otherwise instructed after surgery.  Medication instructions morning of: The patient should hold ALL  medications the morning of the procedure   Discharge: Our plan will be to discharge the patient same day after a period of observation   Patient has a documented dye allergy but it is related to his myasthenia rather than an anaphylactic reaction. He does NOT need premedication as we will NOT plan to use contrast.     

## 2020-07-17 NOTE — Progress Notes (Signed)
Electrophysiology Office Note:    Date:  07/19/2020   ID:  [name], DOB 1951-03-09, MRN [id number]  PCP:  Elfredia Nevins, MD  [name] Cardiologist:  Prentice Docker, MD (Inactive)  [facility] Electrophysiologist:  Lanier Prude, MD   Referring MD: Danice Goltz, [place]   Chief Complaint: Atrial fibrillation  History of Present Illness:    [name] is a 69 y.o. male with a hx of persistent atrial fibrillation on Eliquis, HTN, DM, myasthenia gravis, hypothyroidism who presents to the clinic to discuss management options for his AF. His AF was diagnosed in 03/2020 and was initially managed with atenolol and eliquis for a CHADSVASC of 3. Echo showed a mildly decreased ejection fraction. He is symptomatic while in AF with dyspnea with exertion and dizziness and fatigue. He tells me that he felt significantly better for the 8 days he was in normal rhythm. He can walk to the mailbox and back while in AF and feel winded.  His myasthenia is well controlled and is followed closely by the [facility] neurology clinic. His case was recently discussed with Lynder Parents, DO who is involved in his myasthenia care. Dr Artist Beach advised that "general anesthesia should be OK for a possible ablation procedure if needed, but that neuromuscular blocking agents should be avoided" (telephone encounter 07/02/2020).  Past Medical History:  Diagnosis Date  . Candida esophagitis (HCC) OCT 2014  . Deafness in right ear   . Diabetes mellitus without complication (HCC)   . Difficult intubation   . Esophageal stricture SEP 2011   FOOD IMPACTION-->SAV DIL 16 MM  . GERD (gastroesophageal reflux disease)   . Hypertension   . Kidney stone   . Myasthenia gravis (HCC)   . Sleep apnea   . Thyroid disease     Past Surgical History:  Procedure Laterality Date  . BIOPSY  02/14/2018   Procedure: BIOPSY;  Surgeon: West Bali, MD;  [facility];  Service: Endoscopy;;  gastric  .  CARDIOVERSION N/A 06/08/2020   Procedure: CARDIOVERSION;  Surgeon: Antoine Poche, MD;  [facility];  Service: Endoscopy;  Laterality: N/A;  . CHOLECYSTECTOMY    . COLONOSCOPY  2005   Dr. Lovell Sheehan  . COLONOSCOPY N/A 07/17/2014   Procedure: COLONOSCOPY;  Surgeon: West Bali, MD;  [facility];  Service: Endoscopy;  Laterality: N/A;  9:30-rescheduled 8/28 same time Soledad Gerlach to notify pt  . ESOPHAGOGASTRODUODENOSCOPY N/A 07/25/2013   Shallow ulcer in the cardia, mid esophageal web, stricture at GE junction  . ESOPHAGOGASTRODUODENOSCOPY N/A 02/14/2018   Procedure: ESOPHAGOGASTRODUODENOSCOPY (EGD);  Surgeon: West Bali, MD;  [facility];  Service: Endoscopy;  Laterality: N/A;  11:15am  . ESOPHAGOGASTRODUODENOSCOPY (EGD) WITH ESOPHAGEAL DILATION N/A 08/29/2013   Candida esophagitis, small hiatal hernia, moderate nonerosive gastritis, mid esophageal web  . HERNIA REPAIR     right inguinal  . KIDNEY STONE SURGERY    . NASAL SEPTUM SURGERY    . PALATE SURGERY        . SAVORY DILATION N/A 02/14/2018   Procedure: SAVORY DILATION;  Surgeon: West Bali, MD;  [facility];  Service: Endoscopy;  Laterality: N/A;  . THYMECTOMY    . TONSILLECTOMY    . UPPER GASTROINTESTINAL ENDOSCOPY  SEP 2011   SAV DIL    Current Medications: Current Meds  Medication Sig  . acyclovir (ZOVIRAX) 400 MG tablet Take 800 mg by mouth daily.   [name] apixaban (ELIQUIS)  5 MG TABS tablet Take 1 tablet (5 mg total) by mouth 2 (two) times daily.  . Ascorbic Acid (VITAMIN C) 1000 MG tablet Take 1,000 mg by mouth daily after lunch.  . azaTHIOprine (IMURAN) 50 MG tablet Take 150 mg by mouth daily.   . Calcium Carbonate-Vitamin D (CALCIUM 600+D) 600-200 MG-UNIT TABS Take 2 tablets by mouth daily after lunch.   . diltiazem (CARDIZEM CD) 120 MG 24 hr capsule Take 1 Capsule Daily  . metFORMIN (GLUCOPHAGE-XR) 500 MG 24 hr tablet Take 1,000 mg by mouth 2 (two) times daily.   .  Omega-3 Fatty Acids (FISH OIL ULTRA) 1400 MG CAPS Take 1,400 mg by mouth daily after lunch.  [name] omeprazole (PRILOSEC) 20 MG capsule TAKE (1) CAPSULE BY MOUTH TWICE DAILY.  . Saw Palmetto 450 MG CAPS Take 2,250 mg by mouth daily after lunch.  . SYNTHROID 137 MCG tablet Take 137 mcg by mouth daily before breakfast.   . tamsulosin (FLOMAX) 0.4 MG CAPS capsule Take 0.4 mg by mouth in the morning and at bedtime.   . vitamin E (VITAMIN E) 180 MG (400 UNITS) capsule Take 800 Units by mouth daily after lunch.     Allergies:   Levofloxacin, Tequin [gatifloxacin], Aminoglycosides, Avelox [moxifloxacin hcl in nacl], Botox [onabotulinumtoxina], Botulinum toxins, Calcium channel blockers, Ciprofloxacin, Contrast media [iodinated diagnostic agents], Curare [tubocurarine], Dysport [abobotulinumtoxina], Erythromycin, Factive [gemifloxacin], Floxin [ofloxacin], Gentamycin [gentamicin], Interferons, Kanamycin, Ketek [telithromycin], Levaquin [levofloxacin in d5w], Limonene, Macrolides and ketolides, Magnesium-containing compounds, Myobloc [rimabotulinumtoxinb], Neomycin, Noroxin [norfloxacin], Other, Penicillamine, Procainamide, Propranolol, Quinidine, Quinine derivatives, Streptomycin, Timolol, Tobramycin, Xeomin [incobotulinumtoxina], and Zithromax [azithromycin]   Social History   Socioeconomic History  . Marital status: Married    Spouse name: Not on file  . Number of children: Not on file  . Years of education: Not on file  . Highest education level: Not on file  Occupational History  . Not on file  Tobacco Use  . Smoking status: Never Smoker  . Smokeless tobacco: Never Used  Vaping Use  . Vaping Use: Never used  Substance and Sexual Activity  . Alcohol use: No  . Drug use: No  . Sexual activity: Yes    Partners: Female    Birth control/protection: None    Comment: spouse  Other Topics Concern  . Not on file  Social History Narrative  . Not on file   Social Determinants of Health   Financial  Resource Strain:   . Difficulty of Paying Living Expenses: Not on file  Food Insecurity:   . Worried About Programme researcher, broadcasting/film/video in the Last Year: Not on file  . Ran Out of Food in the Last Year: Not on file  Transportation Needs:   . Lack of Transportation (Medical): Not on file  . Lack of Transportation (Non-Medical): Not on file  Physical Activity:   . Days of Exercise per Week: Not on file  . Minutes of Exercise per Session: Not on file  Stress:   . Feeling of Stress : Not on file  Social Connections:   . Frequency of Communication with Friends and Family: Not on file  . Frequency of Social Gatherings with Friends and Family: Not on file  . Attends Religious Services: Not on file  . Active Member of Clubs or Organizations: Not on file  . Attends Banker Meetings: Not on file  . Marital Status: Not on file     Family History: The patient's family history includes Liver disease in his  father. There is no history of Colon cancer.  ROS:   Please see the history of present illness.    All other systems reviewed and are negative.  EKGs/Labs/Other Studies Reviewed:    The following studies were reviewed today: ECG, echo  EKG:  The ekg ordered today demonstrates atrial fibrillation  05/11/2020 Echo: 1. Patient in afib during study diffuse hypokinesis worse in inferior  base consider repeating study when patient in NSR . Left ventricular  ejection fraction, by estimation, is 45 to 50%. The left ventricle has  mildly decreased function. The left  ventricle has no regional wall motion abnormalities. Left ventricular  diastolic parameters are indeterminate.  2. Right ventricular systolic function is normal. The right ventricular  size is normal.  3. Right atrial size was mildly dilated.  4. The mitral valve is normal in structure. Mild mitral valve  regurgitation. No evidence of mitral stenosis.  5. The aortic valve is tricuspid. Aortic valve regurgitation is  mild.  Mild aortic valve sclerosis is present, with no evidence of aortic valve  stenosis.  6. The inferior vena cava is normal in size with greater than 50%  respiratory variability, suggesting right atrial pressure of 3 mmHg.   Recent Labs: 06/04/2020: BUN 16; Creatinine, Ser 1.29; Hemoglobin 13.8; Platelets 215; Potassium 4.2; Sodium 139  Recent Lipid Panel No results found for: CHOL, TRIG, HDL, CHOLHDL, VLDL, LDLCALC, LDLDIRECT  Physical Exam:    VS:  BP 128/70   Pulse (!) 103   Ht 6\' 1"  (1.854 m)   Wt 215 lb (97.5 kg)   SpO2 98%   BMI 28.37 kg/m     Wt Readings from Last 3 Encounters:  07/19/20 215 lb (97.5 kg)  07/01/20 210 lb (95.3 kg)  05/03/20 213 lb (96.6 kg)     GEN: Well nourished, well developed in no acute distress HEENT: Normal NECK: No JVD; No carotid bruits LYMPHATICS: No lymphadenopathy CARDIAC: irregularly irregular, no murmurs, rubs, gallops RESPIRATORY:  Clear to auscultation without rales, wheezing or rhonchi  ABDOMEN: Soft, non-tender, non-distended MUSCULOSKELETAL:  No edema; No deformity  SKIN: Warm and dry NEUROLOGIC:  Alert and oriented x 3 PSYCHIATRIC:  Normal affect   ASSESSMENT:    1. Persistent atrial fibrillation (HCC)   2. Myasthenia gravis (HCC)    PLAN:    In order of problems listed above:  1. Persistent atrial fibrillation Symptomatic with decreased LV function and patient prefers to avoid atenolol given off target effects. Was recently taken off atenolol and started on diltiazem. He wants to eventually be off of the diltiazem. Given his symptomatic AF and intolerance of medical therapy I think ablation is a very reasonable strategy to help maintain sinus rhythm.  Ablation strategy is PVI, posterior wall isolation.  Risk, benefits, and alternatives to EP study and radiofrequency ablation for afib were also discussed in detail today. These risks include but are not limited to stroke, bleeding, vascular damage, tamponade,  perforation, damage to the esophagus, lungs, and other structures, pulmonary vein stenosis, worsening renal function, and death. The patient understands these risk and wishes to proceed.  We will therefore proceed with catheter ablation at the next available time.  Carto, ICE, anesthesia are requested for the procedure.  Will also obtain TEE prior to the procedure to exclude LAA thrombus and further evaluate atrial anatomy.  2. Myasthenia Gravis Followed by [facility] neuromuscular clinic. Has an appointment next week. Well controlled. Will need to avoid paralytics during general anaesthesia.  Medication Adjustments/Labs and Tests Ordered: Current medicines are reviewed at length with the patient today.  Concerns regarding medicines are outlined above.  Orders Placed This Encounter  Procedures  . EKG 12-Lead   No orders of the defined types were placed in this encounter.   Patient Instructions  Medication Instructions:  Your physician recommends that you continue on your current medications as directed. Please refer to the Current Medication list given to you today.  Labwork: None ordered.  Testing/Procedures: None ordered.  Follow-Up: Your physician wants you to follow-up in: one year with Dr. Ameliarose Shark.   You will receive a reminder letter in the mail two months in advance. If you don't receive a letter, please call our office to schedule the follow-up appointment.   Any Other Special Instructions Will Be Listed Below (If Applicable).  If you need a refill on your cardiac medications before your next appointment, please call your pharmacy.      Signed, Mykela Mewborn T Marlyce Mcdougald, MD  07/19/2020 8:24 AM    [facility]    Anticoagulation instructions: Pt to hold Eliquis for 1 dose(s) prior to procedure and we will plan to resume the day of the procedure unless otherwise instructed after surgery.  Medication instructions morning of: The patient should hold ALL  medications the morning of the procedure   Discharge: Our plan will be to discharge the patient same day after a period of observation   Patient has a documented dye allergy but it is related to his myasthenia rather than an anaphylactic reaction. He does NOT need premedication as we will NOT plan to use contrast.     

## 2020-07-19 ENCOUNTER — Ambulatory Visit (INDEPENDENT_AMBULATORY_CARE_PROVIDER_SITE_OTHER): Payer: Managed Care, Other (non HMO) | Admitting: Cardiology

## 2020-07-19 ENCOUNTER — Encounter: Payer: Self-pay | Admitting: Cardiology

## 2020-07-19 ENCOUNTER — Other Ambulatory Visit: Payer: Self-pay

## 2020-07-19 VITALS — BP 128/70 | HR 103 | Ht 73.0 in | Wt 215.0 lb

## 2020-07-19 DIAGNOSIS — G7000 Myasthenia gravis without (acute) exacerbation: Secondary | ICD-10-CM | POA: Diagnosis not present

## 2020-07-19 DIAGNOSIS — I4819 Other persistent atrial fibrillation: Secondary | ICD-10-CM | POA: Diagnosis not present

## 2020-07-19 LAB — CBC WITH DIFFERENTIAL/PLATELET
Basophils Absolute: 0.1 10*3/uL (ref 0.0–0.2)
Basos: 1 %
EOS (ABSOLUTE): 0.1 10*3/uL (ref 0.0–0.4)
Eos: 1 %
Hematocrit: 42.3 % (ref 37.5–51.0)
Hemoglobin: 14.1 g/dL (ref 13.0–17.7)
Immature Grans (Abs): 0.1 10*3/uL (ref 0.0–0.1)
Immature Granulocytes: 2 %
Lymphocytes Absolute: 0.8 10*3/uL (ref 0.7–3.1)
Lymphs: 11 %
MCH: 31.1 pg (ref 26.6–33.0)
MCHC: 33.3 g/dL (ref 31.5–35.7)
MCV: 93 fL (ref 79–97)
Monocytes Absolute: 1 10*3/uL — ABNORMAL HIGH (ref 0.1–0.9)
Monocytes: 14 %
Neutrophils Absolute: 5 10*3/uL (ref 1.4–7.0)
Neutrophils: 71 %
Platelets: 200 10*3/uL (ref 150–450)
RBC: 4.54 x10E6/uL (ref 4.14–5.80)
RDW: 15.5 % — ABNORMAL HIGH (ref 11.6–15.4)
WBC: 6.9 10*3/uL (ref 3.4–10.8)

## 2020-07-19 LAB — BASIC METABOLIC PANEL
BUN/Creatinine Ratio: 13 (ref 10–24)
BUN: 15 mg/dL (ref 8–27)
CO2: 23 mmol/L (ref 20–29)
Calcium: 9.4 mg/dL (ref 8.6–10.2)
Chloride: 100 mmol/L (ref 96–106)
Creatinine, Ser: 1.19 mg/dL (ref 0.76–1.27)
GFR calc Af Amer: 72 mL/min/{1.73_m2} (ref >59)
GFR calc non Af Amer: 62 mL/min/{1.73_m2} (ref >59)
Glucose: 217 mg/dL — ABNORMAL HIGH (ref 65–99)
Potassium: 4.5 mmol/L (ref 3.5–5.2)
Sodium: 138 mmol/L (ref 134–144)

## 2020-07-19 NOTE — Patient Instructions (Addendum)
Medication Instructions:  Your physician recommends that you continue on your current medications as directed. Please refer to the Current Medication list given to you today.  Labwork: You will get lab work today:  BMP and CBC  Testing/Procedures: None ordered.  Follow-Up:  SEE INSTRUCTION LETTER  Any Other Special Instructions Will Be Listed Below (If Applicable).  If you need a refill on your cardiac medications before your next appointment, please call your pharmacy.    Cardiac Ablation Cardiac ablation is a procedure to disable (ablate) a small amount of heart tissue in very specific places. The heart has many electrical connections. Sometimes these connections are abnormal and can cause the heart to beat very fast or irregularly. Ablating some of the problem areas can improve the heart rhythm or return it to normal. Ablation may be done for people who:  Have Wolff-Parkinson-White syndrome.  Have fast heart rhythms (tachycardia).  Have taken medicines for an abnormal heart rhythm (arrhythmia) that were not effective or caused side effects.  Have a high-risk heartbeat that may be life-threatening. During the procedure, a small incision is made in the neck or the groin, and a long, thin, flexible tube (catheter) is inserted into the incision and moved to the heart. Small devices (electrodes) on the tip of the catheter will send out electrical currents. A type of X-ray (fluoroscopy) will be used to help guide the catheter and to provide images of the heart. Tell a health care provider about:  Any allergies you have.  All medicines you are taking, including vitamins, herbs, eye drops, creams, and over-the-counter medicines.  Any problems you or family members have had with anesthetic medicines.  Any blood disorders you have.  Any surgeries you have had.  Any medical conditions you have, such as kidney failure.  Whether you are pregnant or may be pregnant. What are the  risks? Generally, this is a safe procedure. However, problems may occur, including:  Infection.  Bruising and bleeding at the catheter insertion site.  Bleeding into the chest, especially into the sac that surrounds the heart. This is a serious complication.  Stroke or blood clots.  Damage to other structures or organs.  Allergic reaction to medicines or dyes.  Need for a permanent pacemaker if the normal electrical system is damaged. A pacemaker is a small computer that sends electrical signals to the heart and helps your heart beat normally.  The procedure not being fully effective. This may not be recognized until months later. Repeat ablation procedures are sometimes required. What happens before the procedure?  Follow instructions from your health care provider about eating or drinking restrictions.  Ask your health care provider about: ? Changing or stopping your regular medicines. This is especially important if you are taking diabetes medicines or blood thinners. ? Taking medicines such as aspirin and ibuprofen. These medicines can thin your blood. Do not take these medicines before your procedure if your health care provider instructs you not to.  Plan to have someone take you home from the hospital or clinic.  If you will be going home right after the procedure, plan to have someone with you for 24 hours. What happens during the procedure?  To lower your risk of infection: ? Your health care team will wash or sanitize their hands. ? Your skin will be washed with soap. ? Hair may be removed from the incision area.  An IV tube will be inserted into one of your veins.  You will be given a   medicine to help you relax (sedative).  The skin on your neck or groin will be numbed.  An incision will be made in your neck or your groin.  A needle will be inserted through the incision and into a large vein in your neck or groin.  A catheter will be inserted into the needle  and moved to your heart.  Dye may be injected through the catheter to help your surgeon see the area of the heart that needs treatment.  Electrical currents will be sent from the catheter to ablate heart tissue in desired areas. There are three types of energy that may be used to ablate heart tissue: ? Heat (radiofrequency energy). ? Laser energy. ? Extreme cold (cryoablation).  When the necessary tissue has been ablated, the catheter will be removed.  Pressure will be held on the catheter insertion area to prevent excessive bleeding.  A bandage (dressing) will be placed over the catheter insertion area. The procedure may vary among health care providers and hospitals. What happens after the procedure?  Your blood pressure, heart rate, breathing rate, and blood oxygen level will be monitored until the medicines you were given have worn off.  Your catheter insertion area will be monitored for bleeding. You will need to lie still for a few hours to ensure that you do not bleed from the catheter insertion area.  Do not drive for 24 hours or as long as directed by your health care provider. Summary  Cardiac ablation is a procedure to disable (ablate) a small amount of heart tissue in very specific places. Ablating some of the problem areas can improve the heart rhythm or return it to normal.  During the procedure, electrical currents will be sent from the catheter to ablate heart tissue in desired areas. This information is not intended to replace advice given to you by your health care provider. Make sure you discuss any questions you have with your health care provider. Document Revised: 04/29/2018 Document Reviewed: 09/25/2016 Elsevier Patient Education  2020 [employer] Inc[employer]   Transesophageal Echocardiogram  Transesophageal echocardiogram (TEE) is a test that uses sound waves (echocardiogram) to produce very clear, detailed images of the heart. TEE is done by passing a flexible tube  down the esophagus. The heart is located in front of the esophagus. TEE may be done:  To check how well your heart valves are working.  To check for any abnormal growth or tumor  To look for blood clots  To check for infection of the lining of the heart (endocarditis).  To evaluate the dividing wall (septum) of the heart and check for a hole that did not close after birth (patent foramen ovale or atrial septal defect).  To help diagnose a tear in the wall of the blood vessels (aortic dissection).  To look at the heart shape, size, and function. Any changes can be associated with certain conditions, including heart failure, aneurysm, and coronary heart disease, CAD.  During cardiac valve surgery. This allows the surgeon to assess the valve repair before closing the chest.  During a variety of other cardiac procedures to guide positioning of catheters.  To monitor your heart's response to IV fluids or medicine. TEE is usually not a painful procedure. You may feel the probe press against the back of the throat. The probe does not enter the trachea and does not affect your breathing. Tell a health care provider about:  Any allergies you have.  All medicines you are taking, including vitamins, herbs,  eye drops, creams, and over-the-counter medicines.  Any problems you or family members have had with anesthetic medicines.  Any blood disorders you have.  Any surgeries you have had.  Any medical conditions you have.  Any swallowing difficulties.  Whether you have or have had a blockage of the esophagus (esophageal obstruction).  Whether you are pregnant or may be pregnant. What are the risks? Generally, this is a safe procedure. However, problems may occur, including:  Damage to other structures or organs.  A tear of the esophagus (esophageal rupture).  Irregular heart beat (arrhythmia).  Hoarse voice or difficulty swallowing.  Bleeding (hemorrhage). What happens before  the procedure? Staying hydrated Follow instructions from your health care provider about hydration, which may include:  Up to 3 hours before the procedure - you may continue to drink clear liquids, such as water, clear fruit juice, black coffee, and plain tea. Eating and drinking Follow instructions from your health care provider about eating and drinking, which may include:  8 hours before the procedure - stop eating heavy meals or foods such as meat, fried foods, or fatty foods.  6 hours before the procedure - stop eating light meals or foods, such as toast or cereal.  6 hours before the procedure - stop drinking milk or drinks that contain milk.  3 hours before the procedure - stop drinking clear liquids. General instructions  You will need to remove any dentures or dental retainers.  Plan to have someone take you home from the hospital or clinic.  Plan to have a responsible adult care for you for at least 24 hours after you leave the hospital or clinic. This is important.  Ask your health care provider about: ? Changing or stopping your normal medicines. This is important if you take diabetes medicines or blood thinners. ? Taking over-the-counter medicines, vitamins, herbs, and supplements. ? Taking medicines such as aspirin and ibuprofen. These medicines can thin your blood. Do not take these medicines unless your health care provider tells you to take them. What happens during the procedure?  To reduce your risk of infection: ? Your health care team will wash or sanitize their hands. ? Hair may be removed from the surgical area. ? Your skin will be washed with soap.  An IV will be inserted into one of your veins.  You will be given one or both of the following: ? A medicine to help you relax (sedative). ? A medicine to be sprayed or gargled in order to numb the back of your throat (local anesthetic).  Your blood pressure, heart rate, and breathing (vital signs) will be  monitored during the procedure.  You may be asked to lay on your left side.  A bite block will be placed in your mouth to keep you from biting the tube during the procedure.  The tip of the TEE probe will be placed into the back of your mouth. You will be asked to swallow. This helps to pass the tip of the probe into the esophagus.  Once the tip of the probe is in the correct area, your health care provider will take pictures of the heart.  The probe and bite block will be removed when the procedure is done. The procedure may vary among health care providers and hospitals What happens after the procedure?  Your blood pressure, heart rate, breathing rate, and blood oxygen level will be monitored until the medicines you were given have worn off.  When you first wake  up, your throat may feel slightly sore and will probably still feel numb. This will improve slowly over time. You will not be allowed to eat or drink until the numbness has gone away.  Do not drive for 24 hours if you received a sedative. Summary  Transesophageal echocardiogram (TEE) is a test that uses sound waves (echocardiogram) to produce very clear, detailed images of the heart.  TEE is done by passing a flexible tube down the esophagus.  Generally, this is a safe procedure. However, problems may occur, including damage to other structures or organs, bleeding, irregular heart beat, and a hoarse voice or trouble swallowing.  Tell your health care provider about any medicines and medical conditions you may have, as some conditions may increase your risk of complications. This information is not intended to replace advice given to you by your health care provider. Make sure you discuss any questions you have with your health care provider. Document Revised: 04/17/2017 Document Reviewed: 02/02/2017 Elsevier Patient Education  2020 [employer].

## 2020-07-23 ENCOUNTER — Telehealth: Payer: Self-pay | Admitting: Cardiology

## 2020-07-23 ENCOUNTER — Ambulatory Visit: Payer: Managed Care, Other (non HMO) | Admitting: Cardiology

## 2020-07-23 NOTE — Telephone Encounter (Signed)
[facility] is returning [name]'s call in regards to his lab results. Please advise.

## 2020-07-28 NOTE — Telephone Encounter (Signed)
Returned call to Pt.  Advised blood sugar was elevated when he got his lab work.  Pt states he had just had breakfast.  Following closely with PCP.

## 2020-07-30 ENCOUNTER — Other Ambulatory Visit: Payer: Self-pay

## 2020-07-30 ENCOUNTER — Other Ambulatory Visit (HOSPITAL_COMMUNITY): Payer: Self-pay | Admitting: Physician Assistant

## 2020-07-30 MED ORDER — DILTIAZEM HCL ER COATED BEADS 120 MG PO CP24
ORAL_CAPSULE | ORAL | 2 refills | Status: DC
Start: 2020-07-30 — End: 2020-09-27

## 2020-07-30 NOTE — Telephone Encounter (Signed)
This is a [facility] clinic pt 

## 2020-08-09 ENCOUNTER — Encounter (HOSPITAL_COMMUNITY): Payer: Self-pay | Admitting: Internal Medicine

## 2020-08-09 ENCOUNTER — Other Ambulatory Visit (HOSPITAL_COMMUNITY)
Admission: RE | Admit: 2020-08-09 | Discharge: 2020-08-09 | Disposition: A | Discharge status: Home / Self Care | Payer: Managed Care, Other (non HMO) | Source: Ambulatory Visit | Attending: Cardiology | Admitting: Cardiology

## 2020-08-09 DIAGNOSIS — Z01812 Encounter for preprocedural laboratory examination: Secondary | ICD-10-CM | POA: Insufficient documentation

## 2020-08-09 DIAGNOSIS — Z20822 Contact with and (suspected) exposure to covid-19: Secondary | ICD-10-CM | POA: Insufficient documentation

## 2020-08-09 NOTE — Anesthesia Preprocedure Evaluation (Addendum)
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 on medications Normal cardiovascular exam+ dysrhythmias Atrial Fibrillation  Rhythm:Regular Rate:Normal - Systolic murmurs, - Diastolic murmurs, - Friction Rub, - Carotid Bruit, - Peripheral Edema and - Systolic Click    Neuro/Psych  Neuromuscular disease (myasthenia gravis)    GI/Hepatic GERD (h/o esophageal stricture)  Medicated,  Endo/Other  diabetes, Well Controlled, Type 2Hypothyroidism   Renal/GU Renal InsufficiencyRenal disease  negative genitourinary   Musculoskeletal negative musculoskeletal ROS (+)   Abdominal   Peds negative pediatric ROS (+)  Hematology negative hematology ROS (+)   Anesthesia Other Findings   Reproductive/Obstetrics negative OB ROS                            Anesthesia Physical  Anesthesia Plan  ASA: III  Anesthesia Plan: MAC   Post-op Pain Management:    Induction: Intravenous  PONV Risk Score and Plan: 1 and TIVA and Propofol infusion  Airway Management Planned: Nasal Cannula, Natural Airway and Simple Face Mask  Additional Equipment:   Intra-op Plan:   Post-operative Plan:   Informed Consent: I have reviewed the patients History and Physical, chart, labs and discussed the procedure including the risks, benefits and alternatives for the proposed anesthesia with the patient or authorized representative who has indicated  his/her understanding and acceptance.       Plan Discussed with: CRNA, Surgeon and Anesthesiologist  Anesthesia Plan Comments: (Advised patient that he might be more aware if there are any issues with his airway intraop. )       Anesthesia Quick Evaluation

## 2020-08-10 ENCOUNTER — Ambulatory Visit (HOSPITAL_COMMUNITY): Payer: Managed Care, Other (non HMO) | Admitting: Anesthesiology

## 2020-08-10 ENCOUNTER — Ambulatory Visit (HOSPITAL_COMMUNITY)
Admission: RE | Admit: 2020-08-10 | Discharge: 2020-08-10 | Disposition: A | Discharge status: Home / Self Care | Payer: Managed Care, Other (non HMO) | Attending: Internal Medicine | Admitting: Internal Medicine

## 2020-08-10 ENCOUNTER — Other Ambulatory Visit: Payer: Self-pay

## 2020-08-10 ENCOUNTER — Ambulatory Visit (HOSPITAL_BASED_OUTPATIENT_CLINIC_OR_DEPARTMENT_OTHER): Payer: Managed Care, Other (non HMO)

## 2020-08-10 ENCOUNTER — Encounter (HOSPITAL_COMMUNITY): Payer: Self-pay | Admitting: Internal Medicine

## 2020-08-10 ENCOUNTER — Other Ambulatory Visit (HOSPITAL_COMMUNITY): Payer: Managed Care, Other (non HMO)

## 2020-08-10 ENCOUNTER — Encounter (HOSPITAL_COMMUNITY)
Admission: RE | Disposition: A | Discharge status: Home / Self Care | Payer: Self-pay | Source: Home / Self Care | Attending: Internal Medicine

## 2020-08-10 DIAGNOSIS — G7000 Myasthenia gravis without (acute) exacerbation: Secondary | ICD-10-CM | POA: Insufficient documentation

## 2020-08-10 DIAGNOSIS — G4730 Sleep apnea, unspecified: Secondary | ICD-10-CM | POA: Insufficient documentation

## 2020-08-10 DIAGNOSIS — I340 Nonrheumatic mitral (valve) insufficiency: Secondary | ICD-10-CM | POA: Diagnosis not present

## 2020-08-10 DIAGNOSIS — Z881 Allergy status to other antibiotic agents status: Secondary | ICD-10-CM | POA: Insufficient documentation

## 2020-08-10 DIAGNOSIS — E039 Hypothyroidism, unspecified: Secondary | ICD-10-CM | POA: Insufficient documentation

## 2020-08-10 DIAGNOSIS — I10 Essential (primary) hypertension: Secondary | ICD-10-CM | POA: Diagnosis not present

## 2020-08-10 DIAGNOSIS — Z79899 Other long term (current) drug therapy: Secondary | ICD-10-CM | POA: Insufficient documentation

## 2020-08-10 DIAGNOSIS — I4819 Other persistent atrial fibrillation: Secondary | ICD-10-CM | POA: Insufficient documentation

## 2020-08-10 DIAGNOSIS — Z79890 Hormone replacement therapy (postmenopausal): Secondary | ICD-10-CM | POA: Diagnosis not present

## 2020-08-10 DIAGNOSIS — I4891 Unspecified atrial fibrillation: Secondary | ICD-10-CM | POA: Diagnosis not present

## 2020-08-10 DIAGNOSIS — E119 Type 2 diabetes mellitus without complications: Secondary | ICD-10-CM | POA: Diagnosis not present

## 2020-08-10 DIAGNOSIS — Z7901 Long term (current) use of anticoagulants: Secondary | ICD-10-CM | POA: Diagnosis not present

## 2020-08-10 DIAGNOSIS — K219 Gastro-esophageal reflux disease without esophagitis: Secondary | ICD-10-CM | POA: Insufficient documentation

## 2020-08-10 DIAGNOSIS — Z7984 Long term (current) use of oral hypoglycemic drugs: Secondary | ICD-10-CM | POA: Insufficient documentation

## 2020-08-10 HISTORY — PX: TEE WITHOUT CARDIOVERSION: SHX5443

## 2020-08-10 LAB — SARS CORONAVIRUS 2 (TAT 6-24 HRS): SARS Coronavirus 2: NEGATIVE

## 2020-08-10 LAB — ECHO TEE
MV M vel: 4.51 m/s
MV Peak grad: 81.4 mmHg
Radius: 0.3 cm

## 2020-08-10 LAB — GLUCOSE, CAPILLARY: Glucose-Capillary: 147 mg/dL — ABNORMAL HIGH (ref 70–99)

## 2020-08-10 SURGERY — ECHOCARDIOGRAM, TRANSESOPHAGEAL
Anesthesia: Monitor Anesthesia Care

## 2020-08-10 MED ORDER — BUTAMBEN-TETRACAINE-BENZOCAINE 2-2-14 % EX AERO
INHALATION_SPRAY | CUTANEOUS | Status: DC | PRN
  Administered 2020-08-10: 2 via TOPICAL

## 2020-08-10 MED ORDER — SODIUM CHLORIDE 0.9 % IV SOLN: INTRAVENOUS | Status: DC

## 2020-08-10 MED ORDER — PROPOFOL 500 MG/50ML IV EMUL
INTRAVENOUS | Status: DC | PRN
  Administered 2020-08-10: 75 ug/kg/min via INTRAVENOUS

## 2020-08-10 MED ORDER — LACTATED RINGERS IV SOLN: INTRAVENOUS | Status: DC | PRN

## 2020-08-10 MED ORDER — PROPOFOL 10 MG/ML IV BOLUS
INTRAVENOUS | Status: DC | PRN
  Administered 2020-08-10 (×2): 20 mg via INTRAVENOUS
  Administered 2020-08-10: 10 mg via INTRAVENOUS

## 2020-08-10 NOTE — Anesthesia Procedure Notes (Signed)
Procedure Name: MAC Date/Time: 08/10/2020 8:33 AM Performed by: Darletta Moll, CRNA Pre-anesthesia Checklist: Patient identified, Emergency Drugs available, Suction available and Patient being monitored Patient Re-evaluated:Patient Re-evaluated prior to induction Oxygen Delivery Method: Nasal cannula

## 2020-08-10 NOTE — Interval H&P Note (Signed)
History and Physical Interval Note:  08/10/2020 8:27 AM  [name]  has presented today for surgery, with the diagnosis of AFIB.  The various methods of treatment have been discussed with the patient and family. After consideration of risks, benefits and other options for treatment, the patient has consented to  Procedure(s): TRANSESOPHAGEAL ECHOCARDIOGRAM (TEE) (N/A) as a surgical intervention.  The patient's history has been reviewed, patient examined, no change in status, stable for surgery.  I have reviewed the patient's chart and labs.  Questions were answered to the patient's satisfaction.     Chilton Si, MD

## 2020-08-10 NOTE — Discharge Instructions (Signed)

## 2020-08-10 NOTE — Anesthesia Postprocedure Evaluation (Signed)
Anesthesia Post Note  Patient: [name]  Procedure(s) Performed: TRANSESOPHAGEAL ECHOCARDIOGRAM (TEE) (N/A )     Patient location during evaluation: Endoscopy Anesthesia Type: MAC Level of consciousness: awake and alert Pain management: pain level controlled Vital Signs Assessment: post-procedure vital signs reviewed and stable Respiratory status: spontaneous breathing, nonlabored ventilation and respiratory function stable Cardiovascular status: blood pressure returned to baseline and stable Postop Assessment: no apparent nausea or vomiting Anesthetic complications: no   No complications documented.  Last Vitals:  Vitals:   08/10/20 0910 08/10/20 0920  BP: 100/62 109/79  Pulse: (!) 45 73  Resp: 15 12  Temp:    SpO2: 97% 100%    Last Pain:  Vitals:   08/10/20 0920  TempSrc:   PainSc: 0-No pain                 Merlinda Frederick

## 2020-08-10 NOTE — Transfer of Care (Signed)
Immediate Anesthesia Transfer of Care Note  Patient: [name]  Procedure(s) Performed: TRANSESOPHAGEAL ECHOCARDIOGRAM (TEE) (N/A )  Patient Location: Endoscopy Unit  Anesthesia Type:MAC  Level of Consciousness: drowsy and patient cooperative  Airway & Oxygen Therapy: Patient Spontanous Breathing and Patient connected to nasal cannula oxygen  Post-op Assessment: Report given to RN, Post -op Vital signs reviewed and stable and Patient moving all extremities X 4  Post vital signs: Reviewed and stable  Last Vitals:  Vitals Value Taken Time  BP    Temp    Pulse 61 08/10/20 0859  Resp 17 08/10/20 0859  SpO2 96 % 08/10/20 0859  Vitals shown include unvalidated device data.  Last Pain:  Vitals:   08/10/20 0706  TempSrc: Oral  PainSc: 0-No pain         Complications: No complications documented.

## 2020-08-10 NOTE — CV Procedure (Signed)
Brief TEE Note  LVEF 45%.   Global hypokinesis. No LA/LAA thrombus or masses. Mild TR, mild MR. Trivial PR. Mild atherosclerosis of the descending aorta.  For additional details see full report.  [name] C. Duke Salvia, MD, [facility] 08/10/2020 8:56 AM

## 2020-08-10 NOTE — Progress Notes (Signed)
  Echocardiogram Echocardiogram Transesophageal has been performed.  [name] 08/10/2020, 9:01 AM

## 2020-08-11 ENCOUNTER — Encounter (HOSPITAL_COMMUNITY): Payer: Self-pay | Admitting: Cardiovascular Disease

## 2020-08-11 NOTE — Anesthesia Preprocedure Evaluation (Addendum)
Anesthesia Evaluation  Patient identified by MRN, date of birth, ID band Patient awake    Reviewed: Allergy & Precautions, NPO status , Patient's Chart, lab work & pertinent test results  History of Anesthesia Complications (+) DIFFICULT AIRWAY and history of anesthetic complications  Airway Mallampati: II  TM Distance: <3 FB Neck ROM: Full    Dental  (+) Dental Advisory Given   Pulmonary sleep apnea ,    Pulmonary exam normal        Cardiovascular hypertension, Pt. on medications +CHF  Normal cardiovascular exam+ dysrhythmias Atrial Fibrillation    '21 TEE - EF 40 to 45%. Global hypokinesis. Mild MR, trivial AI.    Neuro/Psych  Myasthenia gravis Right ear deafness   Neuromuscular disease negative psych ROS   GI/Hepatic Neg liver ROS, GERD  Medicated and Controlled,  Endo/Other  diabetes, Type 2, Oral Hypoglycemic AgentsHypothyroidism   Renal/GU negative Renal ROS     Musculoskeletal negative musculoskeletal ROS (+)   Abdominal   Peds  Hematology  On eliquis    Anesthesia Other Findings Covid test negative   Reproductive/Obstetrics                            Anesthesia Physical Anesthesia Plan  ASA: III  Anesthesia Plan: General   Post-op Pain Management:    Induction: Intravenous  PONV Risk Score and Plan: 2 and Treatment may vary due to age or medical condition, Ondansetron and Dexamethasone  Airway Management Planned: Oral ETT and Video Laryngoscope Planned  Additional Equipment: None  Intra-op Plan:   Post-operative Plan: Extubation in OR  Informed Consent: I have reviewed the patients History and Physical, chart, labs and discussed the procedure including the risks, benefits and alternatives for the proposed anesthesia with the patient or authorized representative who has indicated his/her understanding and acceptance.     Dental advisory given  Plan  Discussed with: CRNA and Anesthesiologist  Anesthesia Plan Comments:        Anesthesia Quick Evaluation

## 2020-08-11 NOTE — Progress Notes (Signed)
Attempted to call patient regarding procedure instructions for tomorrow.  Left voice mail with the following Instructions patient on the following items: Arrival time 0530 Nothing to eat or drink after midnight No meds AM of procedure Responsible person to drive you home and stay with you for 24 hrs  Have you missed any doses of anti-coagulant Eliquis take both doses today, none in the morning

## 2020-08-12 ENCOUNTER — Ambulatory Visit (HOSPITAL_COMMUNITY)
Admission: RE | Admit: 2020-08-12 | Discharge: 2020-08-12 | Disposition: A | Discharge status: Home / Self Care | Payer: Managed Care, Other (non HMO) | Attending: Cardiology | Admitting: Cardiology

## 2020-08-12 ENCOUNTER — Encounter (HOSPITAL_COMMUNITY)
Admission: RE | Disposition: A | Discharge status: Home / Self Care | Payer: Managed Care, Other (non HMO) | Source: Home / Self Care | Attending: Cardiology

## 2020-08-12 ENCOUNTER — Ambulatory Visit (HOSPITAL_COMMUNITY): Payer: Managed Care, Other (non HMO) | Admitting: Anesthesiology

## 2020-08-12 ENCOUNTER — Other Ambulatory Visit: Payer: Self-pay

## 2020-08-12 DIAGNOSIS — H9191 Unspecified hearing loss, right ear: Secondary | ICD-10-CM | POA: Insufficient documentation

## 2020-08-12 DIAGNOSIS — Z79899 Other long term (current) drug therapy: Secondary | ICD-10-CM | POA: Insufficient documentation

## 2020-08-12 DIAGNOSIS — G4730 Sleep apnea, unspecified: Secondary | ICD-10-CM | POA: Insufficient documentation

## 2020-08-12 DIAGNOSIS — Z8719 Personal history of other diseases of the digestive system: Secondary | ICD-10-CM | POA: Insufficient documentation

## 2020-08-12 DIAGNOSIS — Z880 Allergy status to penicillin: Secondary | ICD-10-CM | POA: Diagnosis not present

## 2020-08-12 DIAGNOSIS — I4819 Other persistent atrial fibrillation: Secondary | ICD-10-CM | POA: Insufficient documentation

## 2020-08-12 DIAGNOSIS — I10 Essential (primary) hypertension: Secondary | ICD-10-CM | POA: Diagnosis not present

## 2020-08-12 DIAGNOSIS — G7000 Myasthenia gravis without (acute) exacerbation: Secondary | ICD-10-CM | POA: Insufficient documentation

## 2020-08-12 DIAGNOSIS — Z7984 Long term (current) use of oral hypoglycemic drugs: Secondary | ICD-10-CM | POA: Diagnosis not present

## 2020-08-12 DIAGNOSIS — Z91041 Radiographic dye allergy status: Secondary | ICD-10-CM | POA: Diagnosis not present

## 2020-08-12 DIAGNOSIS — Z8379 Family history of other diseases of the digestive system: Secondary | ICD-10-CM | POA: Diagnosis not present

## 2020-08-12 DIAGNOSIS — Z888 Allergy status to other drugs, medicaments and biological substances status: Secondary | ICD-10-CM | POA: Insufficient documentation

## 2020-08-12 DIAGNOSIS — E039 Hypothyroidism, unspecified: Secondary | ICD-10-CM | POA: Insufficient documentation

## 2020-08-12 DIAGNOSIS — Z881 Allergy status to other antibiotic agents status: Secondary | ICD-10-CM | POA: Diagnosis not present

## 2020-08-12 DIAGNOSIS — Z7901 Long term (current) use of anticoagulants: Secondary | ICD-10-CM | POA: Diagnosis not present

## 2020-08-12 DIAGNOSIS — K219 Gastro-esophageal reflux disease without esophagitis: Secondary | ICD-10-CM | POA: Insufficient documentation

## 2020-08-12 DIAGNOSIS — E119 Type 2 diabetes mellitus without complications: Secondary | ICD-10-CM | POA: Diagnosis not present

## 2020-08-12 DIAGNOSIS — Z9049 Acquired absence of other specified parts of digestive tract: Secondary | ICD-10-CM | POA: Diagnosis not present

## 2020-08-12 DIAGNOSIS — Z79890 Hormone replacement therapy (postmenopausal): Secondary | ICD-10-CM | POA: Insufficient documentation

## 2020-08-12 HISTORY — PX: ATRIAL FIBRILLATION ABLATION: EP1191

## 2020-08-12 LAB — GLUCOSE, CAPILLARY: Glucose-Capillary: 143 mg/dL — ABNORMAL HIGH (ref 70–99)

## 2020-08-12 LAB — POCT ACTIVATED CLOTTING TIME
Activated Clotting Time: 257 seconds
Activated Clotting Time: 290 seconds
Activated Clotting Time: 323 seconds

## 2020-08-12 SURGERY — ATRIAL FIBRILLATION ABLATION
Anesthesia: General

## 2020-08-12 MED ORDER — DEXAMETHASONE SODIUM PHOSPHATE 10 MG/ML IJ SOLN
INTRAMUSCULAR | Status: DC | PRN
  Administered 2020-08-12: 6 mg via INTRAVENOUS

## 2020-08-12 MED ORDER — PHENYLEPHRINE HCL-NACL 10-0.9 MG/250ML-% IV SOLN
INTRAVENOUS | Status: DC | PRN
  Administered 2020-08-12: 30 ug/min via INTRAVENOUS

## 2020-08-12 MED ORDER — PANTOPRAZOLE SODIUM 40 MG PO TBEC
40.0000 mg | DELAYED_RELEASE_TABLET | Freq: Every day | ORAL | Status: DC
  Administered 2020-08-12: 40 mg via ORAL
  Filled 2020-08-12: qty 1

## 2020-08-12 MED ORDER — SODIUM CHLORIDE 0.9 % IV SOLN: INTRAVENOUS | Status: DC

## 2020-08-12 MED ORDER — MIDAZOLAM HCL 5 MG/5ML IJ SOLN
INTRAMUSCULAR | Status: DC | PRN
  Administered 2020-08-12: 1 mg via INTRAVENOUS

## 2020-08-12 MED ORDER — PROPOFOL 10 MG/ML IV BOLUS
INTRAVENOUS | Status: DC | PRN
  Administered 2020-08-12: 160 mg via INTRAVENOUS

## 2020-08-12 MED ORDER — FENTANYL CITRATE (PF) 250 MCG/5ML IJ SOLN
INTRAMUSCULAR | Status: DC | PRN
Start: 2020-08-12 — End: 2020-08-12
  Administered 2020-08-12: 75 ug via INTRAVENOUS
  Administered 2020-08-12: 25 ug via INTRAVENOUS

## 2020-08-12 MED ORDER — SODIUM CHLORIDE 0.9 % IV SOLN: 250.0000 mL | INTRAVENOUS | Status: DC | PRN

## 2020-08-12 MED ORDER — EPHEDRINE SULFATE 50 MG/ML IJ SOLN
INTRAMUSCULAR | Status: DC | PRN
  Administered 2020-08-12: 5 mg via INTRAVENOUS

## 2020-08-12 MED ORDER — HEPARIN SODIUM (PORCINE) 1000 UNIT/ML IJ SOLN
INTRAMUSCULAR | Status: DC | PRN
  Administered 2020-08-12: 1000 [IU] via INTRAVENOUS

## 2020-08-12 MED ORDER — LIDOCAINE 2% (20 MG/ML) 5 ML SYRINGE
INTRAMUSCULAR | Status: DC | PRN
  Administered 2020-08-12: 100 mg via INTRAVENOUS

## 2020-08-12 MED ORDER — ONDANSETRON HCL 4 MG/2ML IJ SOLN
INTRAMUSCULAR | Status: DC | PRN
  Administered 2020-08-12: 4 mg via INTRAVENOUS

## 2020-08-12 MED ORDER — PROTAMINE SULFATE 10 MG/ML IV SOLN
INTRAVENOUS | Status: DC | PRN
  Administered 2020-08-12: 15 mg via INTRAVENOUS
  Administered 2020-08-12 (×2): 10 mg via INTRAVENOUS

## 2020-08-12 MED ORDER — APIXABAN 5 MG PO TABS
5.0000 mg | ORAL_TABLET | Freq: Two times a day (BID) | ORAL | Status: DC
  Administered 2020-08-12: 5 mg via ORAL
  Filled 2020-08-12: qty 1

## 2020-08-12 MED ORDER — HEPARIN SODIUM (PORCINE) 1000 UNIT/ML IJ SOLN
INTRAMUSCULAR | Status: AC
  Filled 2020-08-12: qty 1

## 2020-08-12 MED ORDER — SUCCINYLCHOLINE CHLORIDE 20 MG/ML IJ SOLN
INTRAMUSCULAR | Status: DC | PRN
  Administered 2020-08-12: 120 mg via INTRAVENOUS

## 2020-08-12 MED ORDER — SODIUM CHLORIDE 0.9% FLUSH: 3.0000 mL | Freq: Two times a day (BID) | INTRAVENOUS | Status: DC

## 2020-08-12 MED ORDER — HEPARIN SODIUM (PORCINE) 1000 UNIT/ML IJ SOLN
INTRAMUSCULAR | Status: DC | PRN
  Administered 2020-08-12: 2000 [IU] via INTRAVENOUS
  Administered 2020-08-12: 4000 [IU] via INTRAVENOUS
  Administered 2020-08-12: 17000 [IU] via INTRAVENOUS
  Administered 2020-08-12: 5000 [IU] via INTRAVENOUS

## 2020-08-12 MED ORDER — HEPARIN (PORCINE) IN NACL 2-0.9 UNITS/ML
INTRAMUSCULAR | Status: AC | PRN
  Administered 2020-08-12 (×4): 500 mL

## 2020-08-12 MED ORDER — SODIUM CHLORIDE 0.9% FLUSH: 3.0000 mL | INTRAVENOUS | Status: DC | PRN

## 2020-08-12 SURGICAL SUPPLY — 21 items
BLANKET WARM UNDERBOD FULL ACC (MISCELLANEOUS) ×2 IMPLANT
CATH 8FR REPROCESSED SOUNDSTAR (CATHETERS) ×2 IMPLANT
CATH 8FR SOUNDSTAR REPROCESSED (CATHETERS) IMPLANT
CATH MAPPNG PENTARAY F 2-6-2MM (CATHETERS) IMPLANT
CATH S CIRCA THERM PROBE 10F (CATHETERS) ×1 IMPLANT
CATH SMTCH THERMOCOOL SF DF (CATHETERS) ×1 IMPLANT
CATH WEBSTER BI DIR CS D-F CRV (CATHETERS) ×1 IMPLANT
CLOSURE PERCLOSE PROSTYLE (VASCULAR PRODUCTS) ×3 IMPLANT
COVER SWIFTLINK CONNECTOR (BAG) ×2 IMPLANT
MAT PREVALON FULL STRYKER (MISCELLANEOUS) ×1 IMPLANT
PACK EP LATEX FREE (CUSTOM PROCEDURE TRAY) ×2
PACK EP LF (CUSTOM PROCEDURE TRAY) ×1 IMPLANT
PAD PRO RADIOLUCENT 2001M-C (PAD) ×2 IMPLANT
PATCH CARTO3 (PAD) ×1 IMPLANT
PENTARAY F 2-6-2MM (CATHETERS) ×2
SHEATH BAYLIS TRANSSEPTAL 98CM (NEEDLE) ×1 IMPLANT
SHEATH CARTO VIZIGO SM CVD (SHEATH) ×1 IMPLANT
SHEATH PINNACLE 8F 10CM (SHEATH) ×2 IMPLANT
SHEATH PINNACLE 9F 10CM (SHEATH) ×1 IMPLANT
SHEATH PROBE COVER 6X72 (BAG) ×1 IMPLANT
TUBING SMART ABLATE COOLFLOW (TUBING) ×1 IMPLANT

## 2020-08-12 NOTE — Transfer of Care (Signed)
Immediate Anesthesia Transfer of Care Note  Patient: [name]  Procedure(s) Performed: ATRIAL FIBRILLATION ABLATION (N/A )  Patient Location: PACU  Anesthesia Type:General  Level of Consciousness: awake, alert  and oriented  Airway & Oxygen Therapy: Patient Spontanous Breathing and Patient connected to nasal cannula oxygen  Post-op Assessment: Report given to RN, Post -op Vital signs reviewed and stable and Patient moving all extremities X 4  Post vital signs: Reviewed and stable  Last Vitals:  Vitals Value Taken Time  BP 112/75 08/12/20 1057  Temp    Pulse 91 08/12/20 1104  Resp 0 08/12/20 1104  SpO2 98 % 08/12/20 1104  Vitals shown include unvalidated device data.  Last Pain:  Vitals:   08/12/20 1047  TempSrc: Temporal  PainSc: 0-No pain         Complications: No complications documented.

## 2020-08-12 NOTE — Anesthesia Postprocedure Evaluation (Signed)
Anesthesia Post Note  Patient: [name]  Procedure(s) Performed: ATRIAL FIBRILLATION ABLATION (N/A )     Patient location during evaluation: PACU Anesthesia Type: General Level of consciousness: awake and alert Pain management: pain level controlled Vital Signs Assessment: post-procedure vital signs reviewed and stable Respiratory status: spontaneous breathing, nonlabored ventilation and respiratory function stable Cardiovascular status: blood pressure returned to baseline and stable Postop Assessment: no apparent nausea or vomiting Anesthetic complications: no   No complications documented.  Last Vitals:  Vitals:   08/12/20 1130 08/12/20 1145  BP: 140/67 122/82  Pulse: 87 90  Resp: 12 15  Temp:    SpO2: 99% 99%    Last Pain:  Vitals:   08/12/20 1145  TempSrc:   PainSc: 0-No pain                 Beryle Lathe

## 2020-08-12 NOTE — Anesthesia Procedure Notes (Signed)
Procedure Name: Intubation Date/Time: 08/12/2020 8:09 AM Performed by: Laruth Bouchard., CRNA Pre-anesthesia Checklist: Patient identified, Emergency Drugs available, Suction available, Patient being monitored and Timeout performed Patient Re-evaluated:Patient Re-evaluated prior to induction Oxygen Delivery Method: Circle system utilized Preoxygenation: Pre-oxygenation with 100% oxygen Induction Type: IV induction Ventilation: Oral airway inserted - appropriate to patient size and Two handed mask ventilation required Laryngoscope Size: Glidescope and 4 Grade View: Grade I Tube type: Oral Tube size: 7.5 mm Number of attempts: 1 Airway Equipment and Method: Video-laryngoscopy and Stylet Placement Confirmation: ETT inserted through vocal cords under direct vision,  positive ETCO2 and breath sounds checked- equal and bilateral Tube secured with: Tape Dental Injury: Teeth and Oropharynx as per pre-operative assessment  Difficulty Due To: Difficulty was anticipated

## 2020-08-12 NOTE — Interval H&P Note (Signed)
History and Physical Interval Note:  08/12/2020 7:20 AM  [name]  has presented today for surgery, with the diagnosis of afib.  The various methods of treatment have been discussed with the patient and family. After consideration of risks, benefits and other options for treatment, the patient has consented to  Procedure(s): ATRIAL FIBRILLATION ABLATION (N/A) as a surgical intervention.  The patient's history has been reviewed, patient examined, no change in status, stable for surgery.  I have reviewed the patient's chart and labs.  Questions were answered to the patient's satisfaction.     [name]

## 2020-08-12 NOTE — Discharge Instructions (Signed)

## 2020-08-13 ENCOUNTER — Encounter (HOSPITAL_COMMUNITY): Payer: Self-pay | Admitting: Cardiology

## 2020-08-14 ENCOUNTER — Telehealth: Payer: Self-pay | Admitting: Medical

## 2020-08-14 NOTE — Telephone Encounter (Signed)
Patient had an afib ablation 3 days ago and called to report pleuritic chest pain. It started last night, he was still able to breath but hurt if it was too deep. This morning it seemed to have improved. Spoke to Dr. Ladona Ridgel who said as long as he is not severely sob he okay, recommended ibuprofen. Will inform Dr. Abelardo Diesel Fransico Michael, PA-C

## 2020-08-16 ENCOUNTER — Ambulatory Visit: Payer: Managed Care, Other (non HMO) | Admitting: Cardiology

## 2020-08-25 ENCOUNTER — Other Ambulatory Visit: Payer: Self-pay | Admitting: Cardiology

## 2020-09-09 ENCOUNTER — Ambulatory Visit (HOSPITAL_COMMUNITY)
Admission: RE | Admit: 2020-09-09 | Discharge: 2020-09-09 | Disposition: A | Discharge status: Home / Self Care | Payer: Managed Care, Other (non HMO) | Source: Ambulatory Visit | Attending: Physician Assistant | Admitting: Physician Assistant

## 2020-09-09 ENCOUNTER — Other Ambulatory Visit: Payer: Self-pay

## 2020-09-09 VITALS — BP 142/64 | HR 139 | Ht 73.0 in | Wt 215.4 lb

## 2020-09-09 DIAGNOSIS — D6869 Other thrombophilia: Secondary | ICD-10-CM

## 2020-09-09 DIAGNOSIS — Z79899 Other long term (current) drug therapy: Secondary | ICD-10-CM | POA: Diagnosis not present

## 2020-09-09 DIAGNOSIS — I4819 Other persistent atrial fibrillation: Secondary | ICD-10-CM | POA: Diagnosis present

## 2020-09-09 DIAGNOSIS — I10 Essential (primary) hypertension: Secondary | ICD-10-CM | POA: Diagnosis not present

## 2020-09-09 DIAGNOSIS — Z881 Allergy status to other antibiotic agents status: Secondary | ICD-10-CM | POA: Diagnosis not present

## 2020-09-09 DIAGNOSIS — Z889 Allergy status to unspecified drugs, medicaments and biological substances status: Secondary | ICD-10-CM | POA: Insufficient documentation

## 2020-09-09 DIAGNOSIS — K219 Gastro-esophageal reflux disease without esophagitis: Secondary | ICD-10-CM | POA: Diagnosis not present

## 2020-09-09 DIAGNOSIS — Z79811 Long term (current) use of aromatase inhibitors: Secondary | ICD-10-CM | POA: Insufficient documentation

## 2020-09-09 DIAGNOSIS — Z7984 Long term (current) use of oral hypoglycemic drugs: Secondary | ICD-10-CM | POA: Diagnosis not present

## 2020-09-09 DIAGNOSIS — E039 Hypothyroidism, unspecified: Secondary | ICD-10-CM | POA: Diagnosis not present

## 2020-09-09 DIAGNOSIS — Z884 Allergy status to anesthetic agent status: Secondary | ICD-10-CM | POA: Diagnosis not present

## 2020-09-09 DIAGNOSIS — E119 Type 2 diabetes mellitus without complications: Secondary | ICD-10-CM | POA: Insufficient documentation

## 2020-09-09 DIAGNOSIS — G7000 Myasthenia gravis without (acute) exacerbation: Secondary | ICD-10-CM | POA: Diagnosis not present

## 2020-09-09 LAB — BASIC METABOLIC PANEL
Anion gap: 8 (ref 5–15)
BUN: 11 mg/dL (ref 8–23)
CO2: 27 mmol/L (ref 22–32)
Calcium: 9 mg/dL (ref 8.9–10.3)
Chloride: 102 mmol/L (ref 98–111)
Creatinine, Ser: 1.22 mg/dL (ref 0.61–1.24)
GFR, Estimated: >60 mL/min (ref >60)
Glucose, Bld: 200 mg/dL — ABNORMAL HIGH (ref 70–99)
Potassium: 4.2 mmol/L (ref 3.5–5.1)
Sodium: 137 mmol/L (ref 135–145)

## 2020-09-09 LAB — CBC
HCT: 43.1 % (ref 39.0–52.0)
Hemoglobin: 14.1 g/dL (ref 13.0–17.0)
MCH: 30.6 pg (ref 26.0–34.0)
MCHC: 32.7 g/dL (ref 30.0–36.0)
MCV: 93.5 fL (ref 80.0–100.0)
Platelets: 201 10*3/uL (ref 150–400)
RBC: 4.61 MIL/uL (ref 4.22–5.81)
RDW: 14.2 % (ref 11.5–15.5)
WBC: 6.6 10*3/uL (ref 4.0–10.5)
nRBC: 0 % (ref 0.0–0.2)

## 2020-09-09 NOTE — Patient Instructions (Addendum)
Cardioversion scheduled for Thursday, October 28th  - Arrive at the [facility] and go to admitting at [facility] not eat or drink anything after midnight the night prior to your procedure.  - Take all your morning medication (except diabetic medications) with a sip of water prior to arrival.  - You will not be able to drive home after your procedure.  - Do NOT miss any doses of your blood thinner - if you should miss a dose please notify our office immediately.  - If you feel as if you go back into normal rhythm prior to scheduled cardioversion, please notify our office immediately. If your procedure is canceled in the cardioversion suite you will be charged a cancellation fee.

## 2020-09-09 NOTE — Progress Notes (Signed)
Primary Care Physician: Elfredia Nevins, MD Primary Cardiologist: Dr Wyline Mood Primary Electrophysiologist: Dr Lalla Brothers Referring Physician: Dr Storm Frisk [name] is a 69 y.o. male with a history of HTN, DM, myasthenia gravis, hypothyroidism, paroxysmal/ persistent atrial fibrillation who presents for consultation in the [facility].  The patient was initially diagnosed with atrial fibrillation 03/22/20 after presenting with symptoms of chest pain and palpitations. He was started on atenolol for rate control and Eliquis for a CHADS2VASC score of 3. He had an echocardiogram which showed EF 45-50%, no LAE. He underwent DCCV on 06/08/20. Unfortunately, he felt he was back out of rhythm on 06/23/20, confirmed by ECG. On both instances of afib, he was on a prednisone taper for his ears. He has symptoms of dyspnea with exertion and dizziness on standing. He has also felt very fatigued since restarting atenolol.   On follow up today, patient is s/p afib ablation with Dr Lalla Brothers on 08/12/20. Patient reports that he felt great up until about 2 weeks ago when he began noticing having dizzy spells. He is back in rapid afib today. He denies swallowing or groin issues. He did have some postop chest pain which resolved with a short course of ibuprofen.   Today, he denies symptoms of palpitaitons, chest pain, orthopnea, PND, lower extremity edema, presyncope, syncope, snoring, daytime somnolence, bleeding, or neurologic sequela. The patient is tolerating medications without difficulties and is otherwise without complaint today.    Atrial Fibrillation Risk Factors:  he does not have symptoms or diagnosis of sleep apnea. he does not have a history of rheumatic fever.   he has a BMI of Body mass index is 28.42 kg/m.[name] Filed Weights   09/09/20 0827  Weight: 97.7 kg    Family History  Problem Relation Age of Onset  . Liver disease Father        age 22, deceased  . Colon cancer Neg  Hx      Atrial Fibrillation Management history:  Previous antiarrhythmic drugs: none Previous cardioversions: 06/08/20 Previous ablations: 08/12/20 CHADS2VASC score: 3 Anticoagulation history: Eliquis   Past Medical History:  Diagnosis Date  . Candida esophagitis (HCC) OCT 2014  . Deafness in right ear   . Diabetes mellitus without complication (HCC)   . Difficult intubation   . Esophageal stricture SEP 2011   FOOD IMPACTION-->SAV DIL 16 MM  . GERD (gastroesophageal reflux disease)   . Hypertension   . Kidney stone   . Myasthenia gravis (HCC)   . Sleep apnea   . Thyroid disease    Past Surgical History:  Procedure Laterality Date  . ATRIAL FIBRILLATION ABLATION N/A 08/12/2020   Procedure: ATRIAL FIBRILLATION ABLATION;  Surgeon: Lanier Prude, MD;  Location: [facility] INVASIVE CV LAB;  Service: Cardiovascular;  Laterality: N/A;  . BIOPSY  02/14/2018   Procedure: BIOPSY;  Surgeon: West Bali, MD;  Location: [facility];  Service: Endoscopy;;  gastric  . CARDIOVERSION N/A 06/08/2020   Procedure: CARDIOVERSION;  Surgeon: Antoine Poche, MD;  Location: [facility];  Service: Endoscopy;  Laterality: N/A;  . CHOLECYSTECTOMY    . COLONOSCOPY  2005   Dr. Lovell Sheehan  . COLONOSCOPY N/A 07/17/2014   Procedure: COLONOSCOPY;  Surgeon: West Bali, MD;  Location: [facility];  Service: Endoscopy;  Laterality: N/A;  9:30-rescheduled 8/28 same time Soledad Gerlach to notify pt  . ESOPHAGOGASTRODUODENOSCOPY N/A 07/25/2013   Shallow ulcer in the cardia, mid esophageal web, stricture at GE junction  .  ESOPHAGOGASTRODUODENOSCOPY N/A 02/14/2018   Procedure: ESOPHAGOGASTRODUODENOSCOPY (EGD);  Surgeon: West Bali, MD;  Location: [facility];  Service: Endoscopy;  Laterality: N/A;  11:15am  . ESOPHAGOGASTRODUODENOSCOPY (EGD) WITH ESOPHAGEAL DILATION N/A 08/29/2013   Candida esophagitis, small hiatal hernia, moderate nonerosive gastritis, mid esophageal web  . HERNIA REPAIR     right  inguinal  . KIDNEY STONE SURGERY    . NASAL SEPTUM SURGERY    . PALATE SURGERY        . SAVORY DILATION N/A 02/14/2018   Procedure: SAVORY DILATION;  Surgeon: West Bali, MD;  Location: [facility];  Service: Endoscopy;  Laterality: N/A;  . TEE WITHOUT CARDIOVERSION N/A 08/10/2020   Procedure: TRANSESOPHAGEAL ECHOCARDIOGRAM (TEE);  Surgeon: Chilton Si, MD;  Location: [facility] ENDOSCOPY;  Service: Cardiovascular;  Laterality: N/A;  . THYMECTOMY    . TONSILLECTOMY    . UPPER GASTROINTESTINAL ENDOSCOPY  SEP 2011   SAV DIL    Current Outpatient Medications  Medication Sig Dispense Refill  . acyclovir (ZOVIRAX) 400 MG tablet Take 400 mg by mouth 2 (two) times daily.     [name] apixaban (ELIQUIS) 5 MG TABS tablet Take 1 tablet (5 mg total) by mouth 2 (two) times daily. 60 tablet 11  . Ascorbic Acid (VITAMIN C) 1000 MG tablet Take 2,000 mg by mouth daily after lunch.     . azaTHIOprine (IMURAN) 50 MG tablet Take 150 mg by mouth daily.     . Calcium Carbonate-Vitamin D (CALCIUM 600+D) 600-200 MG-UNIT TABS Take 2 tablets by mouth daily after lunch.     . diltiazem (CARDIZEM CD) 120 MG 24 hr capsule TAKE (1) CAPSULE BY MOUTH ONCE DAILY. (Patient taking differently: Take 120 mg by mouth daily. ) 30 capsule 2  . losartan (COZAAR) 50 MG tablet TAKE (1) TABLET BY MOUTH ONCE DAILY. 90 tablet 3  . metFORMIN (GLUCOPHAGE-XR) 500 MG 24 hr tablet Take 1,000 mg by mouth 2 (two) times daily.     . Omega-3 Fatty Acids (FISH OIL ULTRA) 1400 MG CAPS Take 1,400 mg by mouth daily after lunch.    [name] omeprazole (PRILOSEC) 20 MG capsule TAKE (1) CAPSULE BY MOUTH TWICE DAILY. (Patient taking differently: Take 20 mg by mouth 2 (two) times daily before a meal. ) 180 capsule 3  . ONETOUCH VERIO test strip 1 each 3 (three) times daily.    . Saw Palmetto 450 MG CAPS Take 2,250 mg by mouth daily after lunch.    . SYNTHROID 137 MCG tablet Take 137 mcg by mouth daily before breakfast.     . tamsulosin (FLOMAX) 0.4 MG CAPS  capsule Take 0.4 mg by mouth in the morning and at bedtime.     . vitamin E (VITAMIN E) 180 MG (400 UNITS) capsule Take 800 Units by mouth daily after lunch.     No current facility-administered medications for this encounter.    Allergies  Allergen Reactions  . Levofloxacin Anaphylaxis  . Tequin [Gatifloxacin] Anaphylaxis  . Aminoglycosides Other (See Comments)    REACTION: Myasthenia Gravis (MG) Medication Alert  . Avelox [Moxifloxacin Hcl In Nacl] Other (See Comments)    REACTION: FLUOROQUINOLONE ANTIBIOTICS: Myasthenia Gravis (MG) Medication Alert  . Botox [Onabotulinumtoxina] Other (See Comments)    REACTION: Myasthenia Gravis (MG) Medication Alert  . Botulinum Toxins Other (See Comments)    REACTION: Myasthenia Gravis (MG) Medication Alert  . Calcium Channel Blockers Other (See Comments)    (BLOOD PRESSURE MEDICATION) REACTION: Myasthenia Gravis (MG) Medication Alert  .  Ciprofloxacin Other (See Comments)    REACTION: FLUOROQUINOLONE ANTIBIOTICS: Myasthenia Gravis (MG) Medication Alert  . Contrast Media [Iodinated Diagnostic Agents] Other (See Comments)    MD told pt cannot have  . Curare [Tubocurarine] Other (See Comments)    (Usually only used during surgery)  REACTION: Myasthenia Gravis (MG) Medication Alert  . Dysport [Abobotulinumtoxina] Other (See Comments)    REACTION: Myasthenia Gravis (MG) Medication Alert  . Erythromycin Other (See Comments)    REACTION: Myasthenia Gravis (MG) Medication Alert  . Factive [Gemifloxacin] Other (See Comments)    REACTION: FLUOROQUINOLONE ANTIBIOTICS: Myasthenia Gravis (MG) Medication Alert  . Floxin [Ofloxacin] Other (See Comments)    REACTION: FLUOROQUINOLONE ANTIBIOTICS: Myasthenia Gravis (MG) Medication Alert  . Gentamycin [Gentamicin] Other (See Comments)    REACTION:Myasthenia Gravis (MG) Medication Alert  . Interferons Other (See Comments)    REACTION: Myasthenia Gravis (MG) Medication Alert  . Kanamycin Other (See Comments)      REACTION: Myasthenia Gravis (MG) Medication Alert  . Ketek [Telithromycin] Other (See Comments)    REACTION: Myasthenia Gravis (MG) Medication Alert  . Levaquin [Levofloxacin In D5w] Other (See Comments)    REACTION: FLUOROQUINOLONE ANTIBIOTICS: Myasthenia Gravis (MG) Medication Alert  . Limonene Other (See Comments)    REACTION:Myasthenia Gravis (MG) Medication Alert  . Macrolides And Ketolides Other (See Comments)    REACTION: Myasthenia Gravis (MG) Medication Alert REACTION: Myasthenia Gravis (MG) Medication Alert  . Magnesium-Containing Compounds Other (See Comments)    Unknown  . Myobloc [Rimabotulinumtoxinb] Other (See Comments)    REACTION: Myasthenia Gravis (MG) Medication Alert  . Neomycin Other (See Comments)    REACTION: Myasthenia Gravis (MG) Medication Alert  . Noroxin [Norfloxacin] Other (See Comments)    REACTION: FLUOROQUINOLONE ANTIBIOTICS: Myasthenia Gravis (MG) Medication Alert  . Other Other (See Comments)    MAGNESIUM SALTS: Milk of Magnesia, some antacids (Maalox, Mylanta) REACTION: Myasthenia Gravis (MG) Medication Alerta  . Penicillamine Other (See Comments)    D-PENICILLAMINE *DO NOT CONFUSE WITH PENICILLIN*  REACTION: Myasthenia Gravis (MG) Medication Alert  . Procainamide Other (See Comments)    REACTION: Myasthenia Gravis (MG) Medication Alert  . Propranolol Other (See Comments)    REACTION: Myasthenia Gravis (MG) Medication Alert  . Quinidine Other (See Comments)    REACTION: Myasthenia Gravis (MG) Medication Alert  . Quinine Derivatives Other (See Comments)    REACTION: Myasthenia Gravis (MG) Medication Alert  . Streptomycin Other (See Comments)    REACTION:  Myasthenia Gravis (MG) Medication Alert  . Timolol Other (See Comments)    REACTION: Myasthenia Gravis (MG) Medication Alert  . Tobramycin Other (See Comments)    REACTION: Myasthenia Gravis (MG) Medication Alert  . Xeomin [Incobotulinumtoxina] Other (See Comments)    REACTION: Myasthenia  Gravis (MG) Medication Alert  . Zithromax [Azithromycin] Other (See Comments)    REACTION: Myasthenia Gravis (MG) Medication Alert    Social History   Socioeconomic History  . Marital status: Married    Spouse name: Not on file  . Number of children: Not on file  . Years of education: Not on file  . Highest education level: Not on file  Occupational History  . Not on file  Tobacco Use  . Smoking status: Never Smoker  . Smokeless tobacco: Never Used  Vaping Use  . Vaping Use: Never used  Substance and Sexual Activity  . Alcohol use: No  . Drug use: No  . Sexual activity: Yes    Partners: Female    Birth control/protection: None  Comment: spouse  Other Topics Concern  . Not on file  Social History Narrative  . Not on file   Social Determinants of Health   Financial Resource Strain:   . Difficulty of Paying Living Expenses: Not on file  Food Insecurity:   . Worried About Programme researcher, broadcasting/film/video in the Last Year: Not on file  . Ran Out of Food in the Last Year: Not on file  Transportation Needs:   . Lack of Transportation (Medical): Not on file  . Lack of Transportation (Non-Medical): Not on file  Physical Activity:   . Days of Exercise per Week: Not on file  . Minutes of Exercise per Session: Not on file  Stress:   . Feeling of Stress : Not on file  Social Connections:   . Frequency of Communication with Friends and Family: Not on file  . Frequency of Social Gatherings with Friends and Family: Not on file  . Attends Religious Services: Not on file  . Active Member of Clubs or Organizations: Not on file  . Attends Banker Meetings: Not on file  . Marital Status: Not on file  Intimate Partner Violence:   . Fear of Current or Ex-Partner: Not on file  . Emotionally Abused: Not on file  . Physically Abused: Not on file  . Sexually Abused: Not on file     ROS- All systems are reviewed and negative except as per the HPI above.  Physical Exam: Vitals:     09/09/20 0827  BP: (!) 142/64  Pulse: (!) 139  Weight: 97.7 kg  Height: 6\' 1"  (1.854 m)    GEN- The patient is well appearing, alert and oriented x 3 today.   HEENT-head normocephalic, atraumatic, sclera clear, conjunctiva pink, hearing intact, trachea midline. Lungs- Clear to ausculation bilaterally, normal work of breathing Heart- irregular rate and rhythm, tachycardia, no murmurs, rubs or gallops  GI- soft, NT, ND, + BS Extremities- no clubbing, cyanosis, or edema MS- no significant deformity or atrophy Skin- no rash or lesion Psych- euthymic mood, full affect Neuro- strength and sensation are intact   Wt Readings from Last 3 Encounters:  09/09/20 97.7 kg  08/12/20 94.8 kg  08/10/20 94.3 kg    EKG today demonstrates afib HR 139, QRS 90, QTc 486  Echo 05/11/20 demonstrated  1. Patient in afib during study diffuse hypokinesis worse in inferior  base consider repeating study when patient in NSR . Left ventricular  ejection fraction, by estimation, is 45 to 50%. The left ventricle has  mildly decreased function. The left  ventricle has no regional wall motion abnormalities. Left ventricular  diastolic parameters are indeterminate.  2. Right ventricular systolic function is normal. The right ventricular  size is normal.  3. Right atrial size was mildly dilated.  4. The mitral valve is normal in structure. Mild mitral valve  regurgitation. No evidence of mitral stenosis.  5. The aortic valve is tricuspid. Aortic valve regurgitation is mild.  Mild aortic valve sclerosis is present, with no evidence of aortic valve  stenosis.  6. The inferior vena cava is normal in size with greater than 50%  respiratory variability, suggesting right atrial pressure of 3 mmHg.   Epic records are reviewed at length today  CHA2DS2-VASc Score = 3  The patient's score is based upon: CHF History: 0 HTN History: 1 Diabetes History: 1 Stroke History: 0 Vascular Disease History: 0       ASSESSMENT AND PLAN: 1. Persistent Atrial Fibrillation (ICD10:  I48.19) The patient's CHA2DS2-VASc score is 3, indicating a 3.2% annual risk of stroke.   S/p afib ablation with Dr Lambert on 08/12/20. Unfortunately he is back in afib today. Reassured patient that some afib is not uncommon post ablation for the first 3 months.  Continue Eliquis 5 mg BID Continue diltiazem 120 mg daily. Hesitant to increase rate control give his h/o MG. Check bmet/CBC  2. Secondary Hypercoagulable State (ICD10:  D68.69) The patient is at significant risk for stroke/thromboembolism based upon his CHA2DS2-VASc Score of 3.  Continue Apixaban (Eliquis).   3. HTN Stable, no changes today.    Follow up in the AF clinic one week post DCCV.    Ricky Aariyah Sampey PA-C [facility] [address] [place], [place] [address] [phone] 09/09/2020 9:01 AM 

## 2020-09-09 NOTE — H&P (View-Only) (Signed)
Primary Care Physician: Elfredia Nevins, MD Primary Cardiologist: Dr Wyline Mood Primary Electrophysiologist: Dr Lalla Brothers Referring Physician: Dr Storm Frisk [name] is a 69 y.o. male with a history of HTN, DM, myasthenia gravis, hypothyroidism, paroxysmal/ persistent atrial fibrillation who presents for consultation in the [facility].  The patient was initially diagnosed with atrial fibrillation 03/22/20 after presenting with symptoms of chest pain and palpitations. He was started on atenolol for rate control and Eliquis for a CHADS2VASC score of 3. He had an echocardiogram which showed EF 45-50%, no LAE. He underwent DCCV on 06/08/20. Unfortunately, he felt he was back out of rhythm on 06/23/20, confirmed by ECG. On both instances of afib, he was on a prednisone taper for his ears. He has symptoms of dyspnea with exertion and dizziness on standing. He has also felt very fatigued since restarting atenolol.   On follow up today, patient is s/p afib ablation with Dr Lalla Brothers on 08/12/20. Patient reports that he felt great up until about 2 weeks ago when he began noticing having dizzy spells. He is back in rapid afib today. He denies swallowing or groin issues. He did have some postop chest pain which resolved with a short course of ibuprofen.   Today, he denies symptoms of palpitaitons, chest pain, orthopnea, PND, lower extremity edema, presyncope, syncope, snoring, daytime somnolence, bleeding, or neurologic sequela. The patient is tolerating medications without difficulties and is otherwise without complaint today.    Atrial Fibrillation Risk Factors:  he does not have symptoms or diagnosis of sleep apnea. he does not have a history of rheumatic fever.   he has a BMI of Body mass index is 28.42 kg/m.[name] Filed Weights   09/09/20 0827  Weight: 97.7 kg    Family History  Problem Relation Age of Onset  . Liver disease Father        age 22, deceased  . Colon cancer Neg  Hx      Atrial Fibrillation Management history:  Previous antiarrhythmic drugs: none Previous cardioversions: 06/08/20 Previous ablations: 08/12/20 CHADS2VASC score: 3 Anticoagulation history: Eliquis   Past Medical History:  Diagnosis Date  . Candida esophagitis (HCC) OCT 2014  . Deafness in right ear   . Diabetes mellitus without complication (HCC)   . Difficult intubation   . Esophageal stricture SEP 2011   FOOD IMPACTION-->SAV DIL 16 MM  . GERD (gastroesophageal reflux disease)   . Hypertension   . Kidney stone   . Myasthenia gravis (HCC)   . Sleep apnea   . Thyroid disease    Past Surgical History:  Procedure Laterality Date  . ATRIAL FIBRILLATION ABLATION N/A 08/12/2020   Procedure: ATRIAL FIBRILLATION ABLATION;  Surgeon: Lanier Prude, MD;  Location: [facility] INVASIVE CV LAB;  Service: Cardiovascular;  Laterality: N/A;  . BIOPSY  02/14/2018   Procedure: BIOPSY;  Surgeon: West Bali, MD;  Location: [facility]O SU[facility];  Service: Endoscopy;;  gastric  . CARDIOVERSION N/A 06/08/2020   Procedure: CARDIOVERSION;  Surgeon: Antoine Poche, MD;  Location: [facility] ENDO SU[facility];  Service: Endoscopy;  Laterality: N/A;  . CHOLECYSTECTOMY    . COLONOSCOPY  2005   Dr. Lovell Sheehan  . COLONOSCOPY N/A 07/17/2014   Procedure: COLONOSCOPY;  Surgeon: West Bali, MD;  Location: [facility] ENDO SU[facility];  Service: Endoscopy;  Laterality: N/A;  9:30-rescheduled 8/28 same time Soledad Gerlach to notify pt  . ESOPHAGOGASTRODUODENOSCOPY N/A 07/25/2013   Shallow ulcer in the cardia, mid esophageal web, stricture at GE junction  .  ESOPHAGOGASTRODUODENOSCOPY N/A 02/14/2018   Procedure: ESOPHAGOGASTRODUODENOSCOPY (EGD);  Surgeon: West Bali, MD;  Location: [facility];  Service: Endoscopy;  Laterality: N/A;  11:15am  . ESOPHAGOGASTRODUODENOSCOPY (EGD) WITH ESOPHAGEAL DILATION N/A 08/29/2013   Candida esophagitis, small hiatal hernia, moderate nonerosive gastritis, mid esophageal web  . HERNIA REPAIR     right  inguinal  . KIDNEY STONE SURGERY    . NASAL SEPTUM SURGERY    . PALATE SURGERY        . SAVORY DILATION N/A 02/14/2018   Procedure: SAVORY DILATION;  Surgeon: West Bali, MD;  Location: [facility];  Service: Endoscopy;  Laterality: N/A;  . TEE WITHOUT CARDIOVERSION N/A 08/10/2020   Procedure: TRANSESOPHAGEAL ECHOCARDIOGRAM (TEE);  Surgeon: Chilton Si, MD;  Location: [facility] ENDOSCOPY;  Service: Cardiovascular;  Laterality: N/A;  . THYMECTOMY    . TONSILLECTOMY    . UPPER GASTROINTESTINAL ENDOSCOPY  SEP 2011   SAV DIL    Current Outpatient Medications  Medication Sig Dispense Refill  . acyclovir (ZOVIRAX) 400 MG tablet Take 400 mg by mouth 2 (two) times daily.     [name] apixaban (ELIQUIS) 5 MG TABS tablet Take 1 tablet (5 mg total) by mouth 2 (two) times daily. 60 tablet 11  . Ascorbic Acid (VITAMIN C) 1000 MG tablet Take 2,000 mg by mouth daily after lunch.     . azaTHIOprine (IMURAN) 50 MG tablet Take 150 mg by mouth daily.     . Calcium Carbonate-Vitamin D (CALCIUM 600+D) 600-200 MG-UNIT TABS Take 2 tablets by mouth daily after lunch.     . diltiazem (CARDIZEM CD) 120 MG 24 hr capsule TAKE (1) CAPSULE BY MOUTH ONCE DAILY. (Patient taking differently: Take 120 mg by mouth daily. ) 30 capsule 2  . losartan (COZAAR) 50 MG tablet TAKE (1) TABLET BY MOUTH ONCE DAILY. 90 tablet 3  . metFORMIN (GLUCOPHAGE-XR) 500 MG 24 hr tablet Take 1,000 mg by mouth 2 (two) times daily.     . Omega-3 Fatty Acids (FISH OIL ULTRA) 1400 MG CAPS Take 1,400 mg by mouth daily after lunch.    [name] omeprazole (PRILOSEC) 20 MG capsule TAKE (1) CAPSULE BY MOUTH TWICE DAILY. (Patient taking differently: Take 20 mg by mouth 2 (two) times daily before a meal. ) 180 capsule 3  . ONETOUCH VERIO test strip 1 each 3 (three) times daily.    . Saw Palmetto 450 MG CAPS Take 2,250 mg by mouth daily after lunch.    . SYNTHROID 137 MCG tablet Take 137 mcg by mouth daily before breakfast.     . tamsulosin (FLOMAX) 0.4 MG CAPS  capsule Take 0.4 mg by mouth in the morning and at bedtime.     . vitamin E (VITAMIN E) 180 MG (400 UNITS) capsule Take 800 Units by mouth daily after lunch.     No current facility-administered medications for this encounter.    Allergies  Allergen Reactions  . Levofloxacin Anaphylaxis  . Tequin [Gatifloxacin] Anaphylaxis  . Aminoglycosides Other (See Comments)    REACTION: Myasthenia Gravis (MG) Medication Alert  . Avelox [Moxifloxacin Hcl In Nacl] Other (See Comments)    REACTION: FLUOROQUINOLONE ANTIBIOTICS: Myasthenia Gravis (MG) Medication Alert  . Botox [Onabotulinumtoxina] Other (See Comments)    REACTION: Myasthenia Gravis (MG) Medication Alert  . Botulinum Toxins Other (See Comments)    REACTION: Myasthenia Gravis (MG) Medication Alert  . Calcium Channel Blockers Other (See Comments)    (BLOOD PRESSURE MEDICATION) REACTION: Myasthenia Gravis (MG) Medication Alert  .  Ciprofloxacin Other (See Comments)    REACTION: FLUOROQUINOLONE ANTIBIOTICS: Myasthenia Gravis (MG) Medication Alert  . Contrast Media [Iodinated Diagnostic Agents] Other (See Comments)    MD told pt cannot have  . Curare [Tubocurarine] Other (See Comments)    (Usually only used during surgery)  REACTION: Myasthenia Gravis (MG) Medication Alert  . Dysport [Abobotulinumtoxina] Other (See Comments)    REACTION: Myasthenia Gravis (MG) Medication Alert  . Erythromycin Other (See Comments)    REACTION: Myasthenia Gravis (MG) Medication Alert  . Factive [Gemifloxacin] Other (See Comments)    REACTION: FLUOROQUINOLONE ANTIBIOTICS: Myasthenia Gravis (MG) Medication Alert  . Floxin [Ofloxacin] Other (See Comments)    REACTION: FLUOROQUINOLONE ANTIBIOTICS: Myasthenia Gravis (MG) Medication Alert  . Gentamycin [Gentamicin] Other (See Comments)    REACTION:Myasthenia Gravis (MG) Medication Alert  . Interferons Other (See Comments)    REACTION: Myasthenia Gravis (MG) Medication Alert  . Kanamycin Other (See Comments)      REACTION: Myasthenia Gravis (MG) Medication Alert  . Ketek [Telithromycin] Other (See Comments)    REACTION: Myasthenia Gravis (MG) Medication Alert  . Levaquin [Levofloxacin In D5w] Other (See Comments)    REACTION: FLUOROQUINOLONE ANTIBIOTICS: Myasthenia Gravis (MG) Medication Alert  . Limonene Other (See Comments)    REACTION:Myasthenia Gravis (MG) Medication Alert  . Macrolides And Ketolides Other (See Comments)    REACTION: Myasthenia Gravis (MG) Medication Alert REACTION: Myasthenia Gravis (MG) Medication Alert  . Magnesium-Containing Compounds Other (See Comments)    Unknown  . Myobloc [Rimabotulinumtoxinb] Other (See Comments)    REACTION: Myasthenia Gravis (MG) Medication Alert  . Neomycin Other (See Comments)    REACTION: Myasthenia Gravis (MG) Medication Alert  . Noroxin [Norfloxacin] Other (See Comments)    REACTION: FLUOROQUINOLONE ANTIBIOTICS: Myasthenia Gravis (MG) Medication Alert  . Other Other (See Comments)    MAGNESIUM SALTS: Milk of Magnesia, some antacids (Maalox, Mylanta) REACTION: Myasthenia Gravis (MG) Medication Alerta  . Penicillamine Other (See Comments)    D-PENICILLAMINE *DO NOT CONFUSE WITH PENICILLIN*  REACTION: Myasthenia Gravis (MG) Medication Alert  . Procainamide Other (See Comments)    REACTION: Myasthenia Gravis (MG) Medication Alert  . Propranolol Other (See Comments)    REACTION: Myasthenia Gravis (MG) Medication Alert  . Quinidine Other (See Comments)    REACTION: Myasthenia Gravis (MG) Medication Alert  . Quinine Derivatives Other (See Comments)    REACTION: Myasthenia Gravis (MG) Medication Alert  . Streptomycin Other (See Comments)    REACTION:  Myasthenia Gravis (MG) Medication Alert  . Timolol Other (See Comments)    REACTION: Myasthenia Gravis (MG) Medication Alert  . Tobramycin Other (See Comments)    REACTION: Myasthenia Gravis (MG) Medication Alert  . Xeomin [Incobotulinumtoxina] Other (See Comments)    REACTION: Myasthenia  Gravis (MG) Medication Alert  . Zithromax [Azithromycin] Other (See Comments)    REACTION: Myasthenia Gravis (MG) Medication Alert    Social History   Socioeconomic History  . Marital status: Married    Spouse name: Not on file  . Number of children: Not on file  . Years of education: Not on file  . Highest education level: Not on file  Occupational History  . Not on file  Tobacco Use  . Smoking status: Never Smoker  . Smokeless tobacco: Never Used  Vaping Use  . Vaping Use: Never used  Substance and Sexual Activity  . Alcohol use: No  . Drug use: No  . Sexual activity: Yes    Partners: Female    Birth control/protection: None  Comment: spouse  Other Topics Concern  . Not on file  Social History Narrative  . Not on file   Social Determinants of Health   Financial Resource Strain:   . Difficulty of Paying Living Expenses: Not on file  Food Insecurity:   . Worried About Programme researcher, broadcasting/film/video in the Last Year: Not on file  . Ran Out of Food in the Last Year: Not on file  Transportation Needs:   . Lack of Transportation (Medical): Not on file  . Lack of Transportation (Non-Medical): Not on file  Physical Activity:   . Days of Exercise per Week: Not on file  . Minutes of Exercise per Session: Not on file  Stress:   . Feeling of Stress : Not on file  Social Connections:   . Frequency of Communication with Friends and Family: Not on file  . Frequency of Social Gatherings with Friends and Family: Not on file  . Attends Religious Services: Not on file  . Active Member of Clubs or Organizations: Not on file  . Attends Banker Meetings: Not on file  . Marital Status: Not on file  Intimate Partner Violence:   . Fear of Current or Ex-Partner: Not on file  . Emotionally Abused: Not on file  . Physically Abused: Not on file  . Sexually Abused: Not on file     ROS- All systems are reviewed and negative except as per the HPI above.  Physical Exam: Vitals:     09/09/20 0827  BP: (!) 142/64  Pulse: (!) 139  Weight: 97.7 kg  Height: 6\' 1"  (1.854 m)    GEN- The patient is well appearing, alert and oriented x 3 today.   HEENT-head normocephalic, atraumatic, sclera clear, conjunctiva pink, hearing intact, trachea midline. Lungs- Clear to ausculation bilaterally, normal work of breathing Heart- irregular rate and rhythm, tachycardia, no murmurs, rubs or gallops  GI- soft, NT, ND, + BS Extremities- no clubbing, cyanosis, or edema MS- no significant deformity or atrophy Skin- no rash or lesion Psych- euthymic mood, full affect Neuro- strength and sensation are intact   Wt Readings from Last 3 Encounters:  09/09/20 97.7 kg  08/12/20 94.8 kg  08/10/20 94.3 kg    EKG today demonstrates afib HR 139, QRS 90, QTc 486  Echo 05/11/20 demonstrated  1. Patient in afib during study diffuse hypokinesis worse in inferior  base consider repeating study when patient in NSR . Left ventricular  ejection fraction, by estimation, is 45 to 50%. The left ventricle has  mildly decreased function. The left  ventricle has no regional wall motion abnormalities. Left ventricular  diastolic parameters are indeterminate.  2. Right ventricular systolic function is normal. The right ventricular  size is normal.  3. Right atrial size was mildly dilated.  4. The mitral valve is normal in structure. Mild mitral valve  regurgitation. No evidence of mitral stenosis.  5. The aortic valve is tricuspid. Aortic valve regurgitation is mild.  Mild aortic valve sclerosis is present, with no evidence of aortic valve  stenosis.  6. The inferior vena cava is normal in size with greater than 50%  respiratory variability, suggesting right atrial pressure of 3 mmHg.   Epic records are reviewed at length today  CHA2DS2-VASc Score = 3  The patient's score is based upon: CHF History: 0 HTN History: 1 Diabetes History: 1 Stroke History: 0 Vascular Disease History: 0       ASSESSMENT AND PLAN: 1. Persistent Atrial Fibrillation (ICD10:  I48.19) The patient's CHA2DS2-VASc score is 3, indicating a 3.2% annual risk of stroke.   S/p afib ablation with Dr Lalla Brothers on 08/12/20. Unfortunately he is back in afib today. Reassured patient that some afib is not uncommon post ablation for the first 3 months.  Continue Eliquis 5 mg BID Continue diltiazem 120 mg daily. Hesitant to increase rate control give his h/o MG. Check bmet/CBC  2. Secondary Hypercoagulable State (ICD10:  D68.69) The patient is at significant risk for stroke/thromboembolism based upon his CHA2DS2-VASc Score of 3.  Continue Apixaban (Eliquis).   3. HTN Stable, no changes today.    Follow up in the AF clinic one week post DCCV.    Jorja Loa PA-C Afib Clinic [facility] [address] [place], [place] [address] [phone] 09/09/2020 9:01 AM

## 2020-09-11 ENCOUNTER — Other Ambulatory Visit (HOSPITAL_COMMUNITY): Payer: Managed Care, Other (non HMO)

## 2020-09-14 ENCOUNTER — Other Ambulatory Visit (HOSPITAL_COMMUNITY)
Admission: RE | Admit: 2020-09-14 | Discharge: 2020-09-14 | Disposition: A | Discharge status: Home / Self Care | Payer: Managed Care, Other (non HMO) | Source: Ambulatory Visit | Attending: Cardiology | Admitting: Cardiology

## 2020-09-14 ENCOUNTER — Other Ambulatory Visit: Payer: Self-pay

## 2020-09-14 DIAGNOSIS — Z20822 Contact with and (suspected) exposure to covid-19: Secondary | ICD-10-CM | POA: Insufficient documentation

## 2020-09-14 DIAGNOSIS — Z01812 Encounter for preprocedural laboratory examination: Secondary | ICD-10-CM | POA: Diagnosis present

## 2020-09-14 LAB — SARS CORONAVIRUS 2 (TAT 6-24 HRS): SARS Coronavirus 2: NEGATIVE

## 2020-09-16 ENCOUNTER — Encounter (HOSPITAL_COMMUNITY)
Admission: RE | Disposition: A | Discharge status: Home / Self Care | Payer: Self-pay | Source: Home / Self Care | Attending: Cardiology

## 2020-09-16 ENCOUNTER — Ambulatory Visit (HOSPITAL_COMMUNITY): Payer: Managed Care, Other (non HMO) | Admitting: Certified Registered Nurse Anesthetist

## 2020-09-16 ENCOUNTER — Other Ambulatory Visit: Payer: Self-pay

## 2020-09-16 ENCOUNTER — Ambulatory Visit (HOSPITAL_COMMUNITY)
Admission: RE | Admit: 2020-09-16 | Discharge: 2020-09-16 | Disposition: A | Discharge status: Home / Self Care | Payer: Managed Care, Other (non HMO) | Attending: Cardiology | Admitting: Cardiology

## 2020-09-16 DIAGNOSIS — Z882 Allergy status to sulfonamides status: Secondary | ICD-10-CM | POA: Diagnosis not present

## 2020-09-16 DIAGNOSIS — I10 Essential (primary) hypertension: Secondary | ICD-10-CM | POA: Diagnosis not present

## 2020-09-16 DIAGNOSIS — Z880 Allergy status to penicillin: Secondary | ICD-10-CM | POA: Insufficient documentation

## 2020-09-16 DIAGNOSIS — Z7901 Long term (current) use of anticoagulants: Secondary | ICD-10-CM | POA: Diagnosis not present

## 2020-09-16 DIAGNOSIS — Z91041 Radiographic dye allergy status: Secondary | ICD-10-CM | POA: Insufficient documentation

## 2020-09-16 DIAGNOSIS — Z888 Allergy status to other drugs, medicaments and biological substances status: Secondary | ICD-10-CM | POA: Insufficient documentation

## 2020-09-16 DIAGNOSIS — D6869 Other thrombophilia: Secondary | ICD-10-CM | POA: Diagnosis not present

## 2020-09-16 DIAGNOSIS — I4819 Other persistent atrial fibrillation: Secondary | ICD-10-CM | POA: Diagnosis not present

## 2020-09-16 HISTORY — PX: CARDIOVERSION: SHX1299

## 2020-09-16 LAB — GLUCOSE, CAPILLARY: Glucose-Capillary: 135 mg/dL — ABNORMAL HIGH (ref 70–99)

## 2020-09-16 SURGERY — CARDIOVERSION
Anesthesia: General

## 2020-09-16 MED ORDER — ONDANSETRON HCL 4 MG/2ML IJ SOLN: 4.0000 mg | Freq: Once | INTRAMUSCULAR | Status: DC | PRN

## 2020-09-16 MED ORDER — SODIUM CHLORIDE 0.9 % IV SOLN: INTRAVENOUS | Status: DC | PRN

## 2020-09-16 MED ORDER — OXYCODONE HCL 5 MG PO TABS: 5.0000 mg | ORAL_TABLET | Freq: Once | ORAL | Status: DC | PRN

## 2020-09-16 MED ORDER — FENTANYL CITRATE (PF) 100 MCG/2ML IJ SOLN: 25.0000 ug | INTRAMUSCULAR | Status: DC | PRN

## 2020-09-16 MED ORDER — PROPOFOL 10 MG/ML IV BOLUS
INTRAVENOUS | Status: DC | PRN
  Administered 2020-09-16: 50 mg via INTRAVENOUS

## 2020-09-16 MED ORDER — OXYCODONE HCL 5 MG/5ML PO SOLN: 5.0000 mg | Freq: Once | ORAL | Status: DC | PRN

## 2020-09-16 NOTE — Anesthesia Postprocedure Evaluation (Signed)
Anesthesia Post Note  Patient: [name]  Procedure(s) Performed: CARDIOVERSION (N/A )     Patient location during evaluation: Endoscopy Anesthesia Type: General Level of consciousness: awake and alert Pain management: pain level controlled Vital Signs Assessment: post-procedure vital signs reviewed and stable Respiratory status: spontaneous breathing, nonlabored ventilation, respiratory function stable and patient connected to nasal cannula oxygen Cardiovascular status: blood pressure returned to baseline and stable Postop Assessment: no apparent nausea or vomiting Anesthetic complications: no   No complications documented.  Last Vitals:  Vitals:   09/16/20 1110 09/16/20 1120  BP: 115/70 116/75  Pulse: 79 78  Resp: 13 17  Temp:    SpO2: 99% 97%    Last Pain:  Vitals:   09/16/20 1110  TempSrc:   PainSc: 0-No pain                 Vickki Igou

## 2020-09-16 NOTE — Procedures (Signed)
Procedure: Electrical Cardioversion Indications:  Atrial Fibrillation  Procedure Details:  Consent: Risks of procedure as well as the alternatives and risks of each were explained to the (patient/caregiver).  Consent for procedure obtained.  Time Out: Verified patient identification, verified procedure, site/side was marked, verified correct patient position, special equipment/implants available, medications/allergies/relevent history reviewed, required imaging and test results available. PERFORMED.  Patient placed on cardiac monitor, pulse oximetry, supplemental oxygen as necessary.  Sedation given: propofol 50mg  administered by anesthesia Pacer pads placed anterior and posterior chest.  Cardioverted 1 time(s).  Cardioversion with synchronized biphasic 200J shock.  Evaluation: Findings: Post procedure EKG shows: NSR Complications: None Patient did tolerate procedure well.  Time Spent Directly with the Patient:    09/16/2020, 10:55 AM

## 2020-09-16 NOTE — Interval H&P Note (Signed)
History and Physical Interval Note:  09/16/2020 9:51 AM  [name]  has presented today for surgery, with the diagnosis of AFIB.  The various methods of treatment have been discussed with the patient and family. After consideration of risks, benefits and other options for treatment, the patient has consented to  Procedure(s): CARDIOVERSION (N/A) as a surgical intervention.  The patient's history has been reviewed, patient examined, no change in status, stable for surgery.  I have reviewed the patient's chart and labs.  Questions were answered to the patient's satisfaction.     Meriam Sprague

## 2020-09-16 NOTE — Anesthesia Preprocedure Evaluation (Signed)
Anesthesia Evaluation  Patient identified by MRN, date of birth, ID band Patient awake    Reviewed: Allergy & Precautions, NPO status , Patient's Chart, lab work & pertinent test results  History of Anesthesia Complications (+) DIFFICULT AIRWAY and history of anesthetic complications  Airway Mallampati: IV  TM Distance: <3 FB Neck ROM: Full    Dental  (+) Dental Advisory Given,    Pulmonary sleep apnea ,  Covid-19 Nucleic Acid Test Results Lab Results      Component                Value               Date                      SARSCOV2NAA              NEGATIVE            09/14/2020                SARSCOV2NAA              NEGATIVE            08/09/2020                SARSCOV2NAA              NEGATIVE            06/07/2020              breath sounds clear to auscultation       Cardiovascular hypertension, Pt. on medications (-) angina(-) Past MI and (-) CHF + dysrhythmias Atrial Fibrillation  Rhythm:Irregular     Neuro/Psych Stable myasthenia gravis  Neuromuscular disease negative psych ROS   GI/Hepatic GERD  Medicated,Esophageal stricture   Endo/Other  diabetes, Type 2  Renal/GU Renal diseaseLab Results      Component                Value               Date                      CREATININE               1.22                09/09/2020                Musculoskeletal negative musculoskeletal ROS (+)   Abdominal   Peds  Hematology negative hematology ROS (+) Lab Results      Component                Value               Date                      WBC                      6.6                 09/09/2020                HGB                      14.1  09/09/2020                HCT                      43.1                09/09/2020                MCV                      93.5                09/09/2020                PLT                      201                 09/09/2020           eliquis   Anesthesia Other  Findings   Reproductive/Obstetrics                             Anesthesia Physical Anesthesia Plan  ASA: III  Anesthesia Plan: General   Post-op Pain Management:    Induction: Intravenous  PONV Risk Score and Plan: 2 and Treatment may vary due to age or medical condition  Airway Management Planned: Mask  Additional Equipment: None  Intra-op Plan:   Post-operative Plan:   Informed Consent: I have reviewed the patients History and Physical, chart, labs and discussed the procedure including the risks, benefits and alternatives for the proposed anesthesia with the patient or authorized representative who has indicated his/her understanding and acceptance.     Dental advisory given  Plan Discussed with: CRNA and Surgeon  Anesthesia Plan Comments:         Anesthesia Quick Evaluation

## 2020-09-16 NOTE — Discharge Instructions (Signed)
Electrical Cardioversion Electrical cardioversion is the delivery of a jolt of electricity to restore a normal rhythm to the heart. A rhythm that is too fast or is not regular keeps the heart from pumping well. In this procedure, sticky patches or metal paddles are placed on the chest to deliver electricity to the heart from a device. This procedure may be done in an emergency if:  There is low or no blood pressure as a result of the heart rhythm.  Normal rhythm must be restored as fast as possible to protect the brain and heart from further damage.  It may save a life. This may also be a scheduled procedure for irregular or fast heart rhythms that are not immediately life-threatening. Tell a health care provider about:  Any allergies you have.  All medicines you are taking, including vitamins, herbs, eye drops, creams, and over-the-counter medicines.  Any problems you or family members have had with anesthetic medicines.  Any blood disorders you have.  Any surgeries you have had.  Any medical conditions you have.  Whether you are pregnant or may be pregnant. What are the risks? Generally, this is a safe procedure. However, problems may occur, including:  Allergic reactions to medicines.  A blood clot that breaks free and travels to other parts of your body.  The possible return of an abnormal heart rhythm within hours or days after the procedure.  Your heart stopping (cardiac arrest). This is rare. What happens before the procedure? Medicines  Your health care provider may have you start taking: ? Blood-thinning medicines (anticoagulants) so your blood does not clot as easily. ? Medicines to help stabilize your heart rate and rhythm.  Ask your health care provider about: ? Changing or stopping your regular medicines. This is especially important if you are taking diabetes medicines or blood thinners. ? Taking medicines such as aspirin and ibuprofen. These medicines can  thin your blood. Do not take these medicines unless your health care provider tells you to take them. ? Taking over-the-counter medicines, vitamins, herbs, and supplements. General instructions  Follow instructions from your health care provider about eating or drinking restrictions.  Plan to have someone take you home from the hospital or clinic.  If you will be going home right after the procedure, plan to have someone with you for 24 hours.  Ask your health care provider what steps will be taken to help prevent infection. These may include washing your skin with a germ-killing soap. What happens during the procedure?   An IV will be inserted into one of your veins.  Sticky patches (electrodes) or metal paddles may be placed on your chest.  You will be given a medicine to help you relax (sedative).  An electrical shock will be delivered. The procedure may vary among health care providers and hospitals. What can I expect after the procedure?  Your blood pressure, heart rate, breathing rate, and blood oxygen level will be monitored until you leave the hospital or clinic.  Your heart rhythm will be watched to make sure it does not change.  You may have some redness on the skin where the shocks were given. Follow these instructions at home:  Do not drive for 24 hours if you were given a sedative during your procedure.  Take over-the-counter and prescription medicines only as told by your health care provider.  Ask your health care provider how to check your pulse. Check it often.  Rest for 48 hours after the procedure or   as told by your health care provider.  Avoid or limit your caffeine use as told by your health care provider.  Keep all follow-up visits as told by your health care provider. This is important. Contact a health care provider if:  You feel like your heart is beating too quickly or your pulse is not regular.  You have a serious muscle cramp that does not go  away. Get help right away if:  You have discomfort in your chest.  You are dizzy or you feel faint.  You have trouble breathing or you are short of breath.  Your speech is slurred.  You have trouble moving an arm or leg on one side of your body.  Your fingers or toes turn cold or blue. Summary  Electrical cardioversion is the delivery of a jolt of electricity to restore a normal rhythm to the heart.  This procedure may be done right away in an emergency or may be a scheduled procedure if the condition is not an emergency.  Generally, this is a safe procedure.  After the procedure, check your pulse often as told by your health care provider. This information is not intended to replace advice given to you by your health care provider. Make sure you discuss any questions you have with your health care provider. Document Revised: 06/09/2019 Document Reviewed: 06/09/2019 Elsevier Patient Education  2020 [employer]  

## 2020-09-16 NOTE — Transfer of Care (Signed)
Immediate Anesthesia Transfer of Care Note  Patient: [name]  Procedure(s) Performed: CARDIOVERSION (N/A )  Patient Location: Endoscopy Unit  Anesthesia Type:General  Level of Consciousness: awake, alert  and oriented  Airway & Oxygen Therapy: Patient Spontanous Breathing  Post-op Assessment: Report given to RN, Post -op Vital signs reviewed and stable and Patient moving all extremities  Post vital signs: Reviewed and stable  Last Vitals:  Vitals Value Taken Time  BP    Temp    Pulse    Resp    SpO2      Last Pain:  Vitals:   09/16/20 1018  TempSrc: Oral  PainSc: 0-No pain         Complications: No complications documented.

## 2020-09-19 ENCOUNTER — Encounter (HOSPITAL_COMMUNITY): Payer: Self-pay | Admitting: Cardiology

## 2020-09-21 ENCOUNTER — Ambulatory Visit (HOSPITAL_COMMUNITY)
Admission: RE | Admit: 2020-09-21 | Discharge: 2020-09-21 | Disposition: A | Discharge status: Home / Self Care | Payer: Managed Care, Other (non HMO) | Source: Ambulatory Visit | Attending: Physician Assistant | Admitting: Physician Assistant

## 2020-09-21 ENCOUNTER — Encounter (HOSPITAL_COMMUNITY): Payer: Self-pay | Admitting: Physician Assistant

## 2020-09-21 ENCOUNTER — Other Ambulatory Visit: Payer: Self-pay

## 2020-09-21 VITALS — BP 138/80 | HR 69 | Ht 73.0 in | Wt 217.6 lb

## 2020-09-21 DIAGNOSIS — E039 Hypothyroidism, unspecified: Secondary | ICD-10-CM | POA: Diagnosis not present

## 2020-09-21 DIAGNOSIS — Z7901 Long term (current) use of anticoagulants: Secondary | ICD-10-CM | POA: Diagnosis not present

## 2020-09-21 DIAGNOSIS — I4819 Other persistent atrial fibrillation: Secondary | ICD-10-CM | POA: Insufficient documentation

## 2020-09-21 DIAGNOSIS — E119 Type 2 diabetes mellitus without complications: Secondary | ICD-10-CM | POA: Insufficient documentation

## 2020-09-21 DIAGNOSIS — Z7984 Long term (current) use of oral hypoglycemic drugs: Secondary | ICD-10-CM | POA: Insufficient documentation

## 2020-09-21 DIAGNOSIS — Z79890 Hormone replacement therapy (postmenopausal): Secondary | ICD-10-CM | POA: Insufficient documentation

## 2020-09-21 DIAGNOSIS — Z79899 Other long term (current) drug therapy: Secondary | ICD-10-CM | POA: Insufficient documentation

## 2020-09-21 DIAGNOSIS — G7000 Myasthenia gravis without (acute) exacerbation: Secondary | ICD-10-CM | POA: Insufficient documentation

## 2020-09-21 DIAGNOSIS — D6869 Other thrombophilia: Secondary | ICD-10-CM | POA: Diagnosis not present

## 2020-09-21 DIAGNOSIS — I10 Essential (primary) hypertension: Secondary | ICD-10-CM | POA: Insufficient documentation

## 2020-09-21 DIAGNOSIS — Z888 Allergy status to other drugs, medicaments and biological substances status: Secondary | ICD-10-CM | POA: Insufficient documentation

## 2020-09-21 NOTE — Progress Notes (Signed)
Primary Care Physician: Elfredia Nevins, MD Primary Cardiologist: Dr Wyline Mood Primary Electrophysiologist: Dr Lalla Brothers Referring Physician: Dr Storm Frisk [name] is a 69 y.o. male with a history of HTN, DM, myasthenia gravis, hypothyroidism, paroxysmal/ persistent atrial fibrillation who presents for follow up in the [facility].  The patient was initially diagnosed with atrial fibrillation 03/22/20 after presenting with symptoms of chest pain and palpitations. He was started on atenolol for rate control and Eliquis for a CHADS2VASC score of 3. He had an echocardiogram which showed EF 45-50%, no LAE. He underwent DCCV on 06/08/20. Unfortunately, he felt he was back out of rhythm on 06/23/20, confirmed by ECG. On both instances of afib, he was on a prednisone taper for his ears. He has symptoms of dyspnea with exertion and dizziness on standing. He has also felt very fatigued since restarting atenolol. Patient is s/p afib ablation with Dr Lalla Brothers on 08/12/20. Patient reports that he felt great until about 2 weeks post ablation when he began noticing dizzy spells. ECG confirmed he was back in afib and he underwent DCCV on 09/16/20.   On follow up today, patient reports that he has done very well since cardioversion. His dizziness has resolved. He denies any bleeding issues on anticoagulation.   Today, he denies symptoms of palpitaitons, chest pain, orthopnea, PND, lower extremity edema, presyncope, syncope, snoring, daytime somnolence, bleeding, or neurologic sequela. The patient is tolerating medications without difficulties and is otherwise without complaint today.    Atrial Fibrillation Risk Factors:  he does not have symptoms or diagnosis of sleep apnea. he does not have a history of rheumatic fever.   he has a BMI of Body mass index is 28.71 kg/m.[name] Filed Weights   09/21/20 0829  Weight: 98.7 kg    Family History  Problem Relation Age of Onset  . Liver  disease Father        age 42, deceased  . Colon cancer Neg Hx      Atrial Fibrillation Management history:  Previous antiarrhythmic drugs: none Previous cardioversions: 06/08/20, 09/16/20 Previous ablations: 08/12/20 CHADS2VASC score: 3 Anticoagulation history: Eliquis   Past Medical History:  Diagnosis Date  . Candida esophagitis (HCC) OCT 2014  . Deafness in right ear   . Diabetes mellitus without complication (HCC)   . Difficult intubation   . Esophageal stricture SEP 2011   FOOD IMPACTION-->SAV DIL 16 MM  . GERD (gastroesophageal reflux disease)   . Hypertension   . Kidney stone   . Myasthenia gravis (HCC)   . Sleep apnea   . Thyroid disease    Past Surgical History:  Procedure Laterality Date  . ATRIAL FIBRILLATION ABLATION N/A 08/12/2020   Procedure: ATRIAL FIBRILLATION ABLATION;  Surgeon: Lanier Prude, MD;  Location: [facility] INVASIVE CV LAB;  Service: Cardiovascular;  Laterality: N/A;  . BIOPSY  02/14/2018   Procedure: BIOPSY;  Surgeon: West Bali, MD;  Location: [facility];  Service: Endoscopy;;  gastric  . CARDIOVERSION N/A 06/08/2020   Procedure: CARDIOVERSION;  Surgeon: Antoine Poche, MD;  Location: [facility];  Service: Endoscopy;  Laterality: N/A;  . CARDIOVERSION N/A 09/16/2020   Procedure: CARDIOVERSION;  Surgeon: Meriam Sprague, MD;  Location: [facility] ENDOSCOPY;  Service: Cardiovascular;  Laterality: N/A;  . CHOLECYSTECTOMY    . COLONOSCOPY  2005   Dr. Lovell Sheehan  . COLONOSCOPY N/A 07/17/2014   Procedure: COLONOSCOPY;  Surgeon: West Bali, MD;  Location: [facility];  Service: Endoscopy;  Laterality: N/A;  9:30-rescheduled 8/28 same time Soledad Gerlach to notify pt  . ESOPHAGOGASTRODUODENOSCOPY N/A 07/25/2013   Shallow ulcer in the cardia, mid esophageal web, stricture at GE junction  . ESOPHAGOGASTRODUODENOSCOPY N/A 02/14/2018   Procedure: ESOPHAGOGASTRODUODENOSCOPY (EGD);  Surgeon: West Bali, MD;  Location: [facility];  Service:  Endoscopy;  Laterality: N/A;  11:15am  . ESOPHAGOGASTRODUODENOSCOPY (EGD) WITH ESOPHAGEAL DILATION N/A 08/29/2013   Candida esophagitis, small hiatal hernia, moderate nonerosive gastritis, mid esophageal web  . HERNIA REPAIR     right inguinal  . KIDNEY STONE SURGERY    . NASAL SEPTUM SURGERY    . PALATE SURGERY        . SAVORY DILATION N/A 02/14/2018   Procedure: SAVORY DILATION;  Surgeon: West Bali, MD;  Location: [facility];  Service: Endoscopy;  Laterality: N/A;  . TEE WITHOUT CARDIOVERSION N/A 08/10/2020   Procedure: TRANSESOPHAGEAL ECHOCARDIOGRAM (TEE);  Surgeon: Chilton Si, MD;  Location: [facility] ENDOSCOPY;  Service: Cardiovascular;  Laterality: N/A;  . THYMECTOMY    . TONSILLECTOMY    . UPPER GASTROINTESTINAL ENDOSCOPY  SEP 2011   SAV DIL    Current Outpatient Medications  Medication Sig Dispense Refill  . acyclovir (ZOVIRAX) 400 MG tablet Take 800 mg by mouth daily.     [name] apixaban (ELIQUIS) 5 MG TABS tablet Take 1 tablet (5 mg total) by mouth 2 (two) times daily. 60 tablet 11  . Ascorbic Acid (VITAMIN C) 1000 MG tablet Take 2,000 mg by mouth daily after lunch.     . azaTHIOprine (IMURAN) 50 MG tablet Take 150 mg by mouth daily.     . Calcium Carbonate-Vitamin D (CALCIUM 600+D) 600-200 MG-UNIT TABS Take 2 tablets by mouth daily after lunch.     . diltiazem (CARDIZEM CD) 120 MG 24 hr capsule TAKE (1) CAPSULE BY MOUTH ONCE DAILY. 30 capsule 2  . losartan (COZAAR) 50 MG tablet TAKE (1) TABLET BY MOUTH ONCE DAILY. 90 tablet 3  . metFORMIN (GLUCOPHAGE-XR) 500 MG 24 hr tablet Take 1,000 mg by mouth 2 (two) times daily.     . Omega-3 Fatty Acids (FISH OIL ULTRA) 1400 MG CAPS Take 1,400 mg by mouth daily after lunch.    [name] omeprazole (PRILOSEC) 20 MG capsule TAKE (1) CAPSULE BY MOUTH TWICE DAILY. 180 capsule 3  . ONETOUCH VERIO test strip 1 each 3 (three) times daily.    . Saw Palmetto 450 MG CAPS Take 2,250 mg by mouth daily after lunch.    . SYNTHROID 137 MCG tablet Take  137 mcg by mouth daily before breakfast.     . tamsulosin (FLOMAX) 0.4 MG CAPS capsule Take 0.4 mg by mouth in the morning and at bedtime.     . vitamin E (VITAMIN E) 180 MG (400 UNITS) capsule Take 800 Units by mouth daily after lunch.     No current facility-administered medications for this encounter.    Allergies  Allergen Reactions  . Levofloxacin Anaphylaxis  . Tequin [Gatifloxacin] Anaphylaxis  . Aminoglycosides Other (See Comments)    REACTION: Myasthenia Gravis (MG) Medication Alert  . Avelox [Moxifloxacin Hcl In Nacl] Other (See Comments)    REACTION: FLUOROQUINOLONE ANTIBIOTICS: Myasthenia Gravis (MG) Medication Alert  . Botox [Onabotulinumtoxina] Other (See Comments)    REACTION: Myasthenia Gravis (MG) Medication Alert  . Botulinum Toxins Other (See Comments)    REACTION: Myasthenia Gravis (MG) Medication Alert  . Calcium Channel Blockers Other (See Comments)    (BLOOD PRESSURE MEDICATION) REACTION: Myasthenia Gravis (  MG) Medication Alert  . Ciprofloxacin Other (See Comments)    REACTION: FLUOROQUINOLONE ANTIBIOTICS: Myasthenia Gravis (MG) Medication Alert  . Contrast Media [Iodinated Diagnostic Agents] Other (See Comments)    MD told pt cannot have  . Curare [Tubocurarine] Other (See Comments)    (Usually only used during surgery)  REACTION: Myasthenia Gravis (MG) Medication Alert  . Dysport [Abobotulinumtoxina] Other (See Comments)    REACTION: Myasthenia Gravis (MG) Medication Alert  . Erythromycin Other (See Comments)    REACTION: Myasthenia Gravis (MG) Medication Alert  . Factive [Gemifloxacin] Other (See Comments)    REACTION: FLUOROQUINOLONE ANTIBIOTICS: Myasthenia Gravis (MG) Medication Alert  . Floxin [Ofloxacin] Other (See Comments)    REACTION: FLUOROQUINOLONE ANTIBIOTICS: Myasthenia Gravis (MG) Medication Alert  . Gentamycin [Gentamicin] Other (See Comments)    REACTION:Myasthenia Gravis (MG) Medication Alert  . Interferons Other (See Comments)     REACTION: Myasthenia Gravis (MG) Medication Alert  . Kanamycin Other (See Comments)    REACTION: Myasthenia Gravis (MG) Medication Alert  . Ketek [Telithromycin] Other (See Comments)    REACTION: Myasthenia Gravis (MG) Medication Alert  . Levaquin [Levofloxacin In D5w] Other (See Comments)    REACTION: FLUOROQUINOLONE ANTIBIOTICS: Myasthenia Gravis (MG) Medication Alert  . Limonene Other (See Comments)    REACTION:Myasthenia Gravis (MG) Medication Alert  . Macrolides And Ketolides Other (See Comments)    REACTION: Myasthenia Gravis (MG) Medication Alert REACTION: Myasthenia Gravis (MG) Medication Alert  . Magnesium-Containing Compounds Other (See Comments)    Unknown  . Myobloc [Rimabotulinumtoxinb] Other (See Comments)    REACTION: Myasthenia Gravis (MG) Medication Alert  . Neomycin Other (See Comments)    REACTION: Myasthenia Gravis (MG) Medication Alert  . Noroxin [Norfloxacin] Other (See Comments)    REACTION: FLUOROQUINOLONE ANTIBIOTICS: Myasthenia Gravis (MG) Medication Alert  . Other Other (See Comments)    MAGNESIUM SALTS: Milk of Magnesia, some antacids (Maalox, Mylanta) REACTION: Myasthenia Gravis (MG) Medication Alerta  . Penicillamine Other (See Comments)    D-PENICILLAMINE *DO NOT CONFUSE WITH PENICILLIN*  REACTION: Myasthenia Gravis (MG) Medication Alert  . Procainamide Other (See Comments)    REACTION: Myasthenia Gravis (MG) Medication Alert  . Propranolol Other (See Comments)    REACTION: Myasthenia Gravis (MG) Medication Alert  . Quinidine Other (See Comments)    REACTION: Myasthenia Gravis (MG) Medication Alert  . Quinine Derivatives Other (See Comments)    REACTION: Myasthenia Gravis (MG) Medication Alert  . Streptomycin Other (See Comments)    REACTION:  Myasthenia Gravis (MG) Medication Alert  . Timolol Other (See Comments)    REACTION: Myasthenia Gravis (MG) Medication Alert  . Tobramycin Other (See Comments)    REACTION: Myasthenia Gravis (MG) Medication  Alert  . Xeomin [Incobotulinumtoxina] Other (See Comments)    REACTION: Myasthenia Gravis (MG) Medication Alert  . Zithromax [Azithromycin] Other (See Comments)    REACTION: Myasthenia Gravis (MG) Medication Alert    Social History   Socioeconomic History  . Marital status: Married    Spouse name: Not on file  . Number of children: Not on file  . Years of education: Not on file  . Highest education level: Not on file  Occupational History  . Not on file  Tobacco Use  . Smoking status: Never Smoker  . Smokeless tobacco: Never Used  Vaping Use  . Vaping Use: Never used  Substance and Sexual Activity  . Alcohol use: No  . Drug use: No  . Sexual activity: Yes    Partners: Female    Birth control/protection:  None    Comment: spouse  Other Topics Concern  . Not on file  Social History Narrative  . Not on file   Social Determinants of Health   Financial Resource Strain:   . Difficulty of Paying Living Expenses: Not on file  Food Insecurity:   . Worried About Programme researcher, broadcasting/film/video in the Last Year: Not on file  . Ran Out of Food in the Last Year: Not on file  Transportation Needs:   . Lack of Transportation (Medical): Not on file  . Lack of Transportation (Non-Medical): Not on file  Physical Activity:   . Days of Exercise per Week: Not on file  . Minutes of Exercise per Session: Not on file  Stress:   . Feeling of Stress : Not on file  Social Connections:   . Frequency of Communication with Friends and Family: Not on file  . Frequency of Social Gatherings with Friends and Family: Not on file  . Attends Religious Services: Not on file  . Active Member of Clubs or Organizations: Not on file  . Attends Banker Meetings: Not on file  . Marital Status: Not on file  Intimate Partner Violence:   . Fear of Current or Ex-Partner: Not on file  . Emotionally Abused: Not on file  . Physically Abused: Not on file  . Sexually Abused: Not on file     ROS- All  systems are reviewed and negative except as per the HPI above.  Physical Exam: Vitals:   09/21/20 0829  BP: 138/80  Pulse: 69  Weight: 98.7 kg  Height: 6\' 1"  (1.854 m)    GEN- The patient is well appearing, alert and oriented x 3 today.   HEENT-head normocephalic, atraumatic, sclera clear, conjunctiva pink, hearing intact, trachea midline. Lungs- Clear to ausculation bilaterally, normal work of breathing Heart- Regular rate and rhythm, no murmurs, rubs or gallops  GI- soft, NT, ND, + BS Extremities- no clubbing, cyanosis, or edema MS- no significant deformity or atrophy Skin- no rash or lesion Psych- euthymic mood, full affect Neuro- strength and sensation are intact   Wt Readings from Last 3 Encounters:  09/21/20 98.7 kg  09/16/20 97.5 kg  09/09/20 97.7 kg    EKG today demonstrates SR HR 69, PR 162, QRS 90, QTc 407  Echo 05/11/20 demonstrated  1. Patient in afib during study diffuse hypokinesis worse in inferior  base consider repeating study when patient in NSR . Left ventricular  ejection fraction, by estimation, is 45 to 50%. The left ventricle has  mildly decreased function. The left  ventricle has no regional wall motion abnormalities. Left ventricular  diastolic parameters are indeterminate.  2. Right ventricular systolic function is normal. The right ventricular  size is normal.  3. Right atrial size was mildly dilated.  4. The mitral valve is normal in structure. Mild mitral valve  regurgitation. No evidence of mitral stenosis.  5. The aortic valve is tricuspid. Aortic valve regurgitation is mild.  Mild aortic valve sclerosis is present, with no evidence of aortic valve  stenosis.  6. The inferior vena cava is normal in size with greater than 50%  respiratory variability, suggesting right atrial pressure of 3 mmHg.   Epic records are reviewed at length today  CHA2DS2-VASc Score = 3  The patient's score is based upon: CHF History: 0 HTN History:  1 Diabetes History: 1 Stroke History: 0 Vascular Disease History: 0      ASSESSMENT AND PLAN: 1. Persistent Atrial  Fibrillation (ICD10:  I48.19) The patient's CHA2DS2-VASc score is 3, indicating a 3.2% annual risk of stroke.   S/p afib ablation with Dr Lalla Brothers on 08/12/20. S/p DCCV 09/16/20. Patient appears to be maintaining SR.  Continue Eliquis 5 mg BID Continue diltiazem 120 mg daily for now.  2. Secondary Hypercoagulable State (ICD10:  D68.69) The patient is at significant risk for stroke/thromboembolism based upon his CHA2DS2-VASc Score of 3.  Continue Apixaban (Eliquis).   3. HTN Stable, no changes today.   Follow up with Dr Lalla Brothers as scheduled.    Jorja Loa PA-C [facility] [address] [place], [place] [address] [phone] 09/21/2020 8:44 AM

## 2020-09-27 ENCOUNTER — Other Ambulatory Visit (HOSPITAL_COMMUNITY): Payer: Self-pay | Admitting: Physician Assistant

## 2020-10-18 ENCOUNTER — Other Ambulatory Visit: Payer: Self-pay

## 2020-10-18 ENCOUNTER — Telehealth: Payer: Self-pay

## 2020-10-18 ENCOUNTER — Other Ambulatory Visit: Payer: Self-pay | Admitting: Urology

## 2020-10-18 ENCOUNTER — Other Ambulatory Visit: Payer: Managed Care, Other (non HMO)

## 2020-10-18 DIAGNOSIS — R300 Dysuria: Secondary | ICD-10-CM

## 2020-10-18 LAB — URINALYSIS, ROUTINE W REFLEX MICROSCOPIC
Bilirubin, UA: NEGATIVE
Leukocytes,UA: NEGATIVE
Nitrite, UA: NEGATIVE
Specific Gravity, UA: >1.03 — ABNORMAL HIGH (ref 1.005–1.030)
Urobilinogen, Ur: 2 mg/dL — ABNORMAL HIGH (ref 0.2–1.0)
pH, UA: 5.5 (ref 5.0–7.5)

## 2020-10-18 NOTE — Telephone Encounter (Signed)
Tried to call pt. [name]. Saying UA was ok and ask if he still wanted appt for tomorrow. Also culture pending.

## 2020-10-18 NOTE — Telephone Encounter (Signed)
Pt called and made aware to drop off urine specimen per Dr. McKenzie 

## 2020-10-18 NOTE — Telephone Encounter (Signed)
-----   Message from Malen Gauze, MD sent at 10/18/2020 11:09 AM EST ----- Send it for culture but it looks ok ----- Message ----- From: Dalia Heading, LPN Sent: 01/29/8117  10:55 AM EST To: Malen Gauze, MD  Pls review. Culture to follow.

## 2020-10-18 NOTE — Telephone Encounter (Signed)
Have him drop off a urine 

## 2020-10-19 ENCOUNTER — Encounter: Payer: Self-pay | Admitting: Urology

## 2020-10-19 ENCOUNTER — Ambulatory Visit (INDEPENDENT_AMBULATORY_CARE_PROVIDER_SITE_OTHER): Payer: Managed Care, Other (non HMO) | Admitting: Urology

## 2020-10-19 VITALS — BP 125/67 | HR 98 | Temp 98.2°F | Ht 73.0 in | Wt 217.6 lb

## 2020-10-19 DIAGNOSIS — N411 Chronic prostatitis: Secondary | ICD-10-CM

## 2020-10-19 DIAGNOSIS — N138 Other obstructive and reflux uropathy: Secondary | ICD-10-CM | POA: Insufficient documentation

## 2020-10-19 DIAGNOSIS — N410 Acute prostatitis: Secondary | ICD-10-CM | POA: Insufficient documentation

## 2020-10-19 DIAGNOSIS — N401 Enlarged prostate with lower urinary tract symptoms: Secondary | ICD-10-CM | POA: Diagnosis not present

## 2020-10-19 DIAGNOSIS — R300 Dysuria: Secondary | ICD-10-CM | POA: Insufficient documentation

## 2020-10-19 MED ORDER — SULFAMETHOXAZOLE-TRIMETHOPRIM 800-160 MG PO TABS: 1.0000 | ORAL_TABLET | Freq: Two times a day (BID) | ORAL | 0 refills | Status: DC

## 2020-10-19 NOTE — Telephone Encounter (Signed)
Pt seen today

## 2020-10-19 NOTE — Progress Notes (Signed)
10/19/2020 3:40 PM   [name] 08-12-1951 [id number]  Referring provider: Elfredia Nevins, MD [address] [place],  [place] [address]  Dysuria and urinary incontinence  HPI: [name] is a 69yo here for followup for BPH. He developed dysuria on Saturday. He developed a weaker stream. He is currently on flomax.    PMH: Past Medical History:  Diagnosis Date  . Candida esophagitis (HCC) OCT 2014  . Deafness in right ear   . Diabetes mellitus without complication (HCC)   . Difficult intubation   . Esophageal stricture SEP 2011   FOOD IMPACTION-->SAV DIL 16 MM  . GERD (gastroesophageal reflux disease)   . Hypertension   . Kidney stone   . Myasthenia gravis (HCC)   . Sleep apnea   . Thyroid disease     Surgical History: Past Surgical History:  Procedure Laterality Date  . ATRIAL FIBRILLATION ABLATION N/A 08/12/2020   Procedure: ATRIAL FIBRILLATION ABLATION;  Surgeon: Lanier Prude, MD;  Location: [facility] INVASIVE CV LAB;  Service: Cardiovascular;  Laterality: N/A;  . BIOPSY  02/14/2018   Procedure: BIOPSY;  Surgeon: West Bali, MD;  Location: [facility] ENDO SUITE;  Service: Endoscopy;;  gastric  . CARDIOVERSION N/A 06/08/2020   Procedure: CARDIOVERSION;  Surgeon: Antoine Poche, MD;  Location: [facility];  Service: Endoscopy;  Laterality: N/A;  . CARDIOVERSION N/A 09/16/2020   Procedure: CARDIOVERSION;  Surgeon: Meriam Sprague, MD;  Location: [facility] ENDOSCOPY;  Service: Cardiovascular;  Laterality: N/A;  . CHOLECYSTECTOMY    . COLONOSCOPY  2005   Dr. Lovell Sheehan  . COLONOSCOPY N/A 07/17/2014   Procedure: COLONOSCOPY;  Surgeon: West Bali, MD;  Location: [facility];  Service: Endoscopy;  Laterality: N/A;  9:30-rescheduled 8/28 same time Soledad Gerlach to notify pt  . ESOPHAGOGASTRODUODENOSCOPY N/A 07/25/2013   Shallow ulcer in the cardia, mid esophageal web, stricture at GE junction  . ESOPHAGOGASTRODUODENOSCOPY N/A 02/14/2018   Procedure:  ESOPHAGOGASTRODUODENOSCOPY (EGD);  Surgeon: West Bali, MD;  Location: [facility];  Service: Endoscopy;  Laterality: N/A;  11:15am  . ESOPHAGOGASTRODUODENOSCOPY (EGD) WITH ESOPHAGEAL DILATION N/A 08/29/2013   Candida esophagitis, small hiatal hernia, moderate nonerosive gastritis, mid esophageal web  . HERNIA REPAIR     right inguinal  . KIDNEY STONE SURGERY    . NASAL SEPTUM SURGERY    . PALATE SURGERY        . SAVORY DILATION N/A 02/14/2018   Procedure: SAVORY DILATION;  Surgeon: West Bali, MD;  Location: [facility];  Service: Endoscopy;  Laterality: N/A;  . TEE WITHOUT CARDIOVERSION N/A 08/10/2020   Procedure: TRANSESOPHAGEAL ECHOCARDIOGRAM (TEE);  Surgeon: Chilton Si, MD;  Location: [facility] ENDOSCOPY;  Service: Cardiovascular;  Laterality: N/A;  . THYMECTOMY    . TONSILLECTOMY    . UPPER GASTROINTESTINAL ENDOSCOPY  SEP 2011   SAV DIL    Home Medications:  Allergies as of 10/19/2020      Reactions   Levofloxacin Anaphylaxis   Tequin [gatifloxacin] Anaphylaxis   Aminoglycosides Other (See Comments)   REACTION: Myasthenia Gravis (MG) Medication Alert   Avelox [moxifloxacin Hcl In Nacl] Other (See Comments)   REACTION: FLUOROQUINOLONE ANTIBIOTICS: Myasthenia Gravis (MG) Medication Alert   Botox [onabotulinumtoxina] Other (See Comments)   REACTION: Myasthenia Gravis (MG) Medication Alert   Botulinum Toxins Other (See Comments)   REACTION: Myasthenia Gravis (MG) Medication Alert   Calcium Channel Blockers Other (See Comments)   (BLOOD PRESSURE MEDICATION) REACTION: Myasthenia Gravis (MG) Medication Alert   Ciprofloxacin  Other (See Comments)   REACTION: FLUOROQUINOLONE ANTIBIOTICS: Myasthenia Gravis (MG) Medication Alert   Contrast Media [iodinated Diagnostic Agents] Other (See Comments)   MD told pt cannot have   Curare [tubocurarine] Other (See Comments)   (Usually only used during surgery)  REACTION: Myasthenia Gravis (MG) Medication Alert   Dysport  [abobotulinumtoxina] Other (See Comments)   REACTION: Myasthenia Gravis (MG) Medication Alert   Erythromycin Other (See Comments)   REACTION: Myasthenia Gravis (MG) Medication Alert   Factive [gemifloxacin] Other (See Comments)   REACTION: FLUOROQUINOLONE ANTIBIOTICS: Myasthenia Gravis (MG) Medication Alert   Floxin [ofloxacin] Other (See Comments)   REACTION: FLUOROQUINOLONE ANTIBIOTICS: Myasthenia Gravis (MG) Medication Alert   Gentamycin [gentamicin] Other (See Comments)   REACTION:Myasthenia Gravis (MG) Medication Alert   Interferons Other (See Comments)   REACTION: Myasthenia Gravis (MG) Medication Alert   Kanamycin Other (See Comments)   REACTION: Myasthenia Gravis (MG) Medication Alert   Ketek [telithromycin] Other (See Comments)   REACTION: Myasthenia Gravis (MG) Medication Alert   Levaquin [levofloxacin In D5w] Other (See Comments)   REACTION: FLUOROQUINOLONE ANTIBIOTICS: Myasthenia Gravis (MG) Medication Alert   Limonene Other (See Comments)   REACTION:Myasthenia Gravis (MG) Medication Alert   Macrolides And Ketolides Other (See Comments)   REACTION: Myasthenia Gravis (MG) Medication Alert REACTION: Myasthenia Gravis (MG) Medication Alert   Magnesium-containing Compounds Other (See Comments)   Unknown   Myobloc [rimabotulinumtoxinb] Other (See Comments)   REACTION: Myasthenia Gravis (MG) Medication Alert   Neomycin Other (See Comments)   REACTION: Myasthenia Gravis (MG) Medication Alert   Noroxin [norfloxacin] Other (See Comments)   REACTION: FLUOROQUINOLONE ANTIBIOTICS: Myasthenia Gravis (MG) Medication Alert   Other Other (See Comments)   MAGNESIUM SALTS: Milk of Magnesia, some antacids (Maalox, Mylanta) REACTION: Myasthenia Gravis (MG) Medication Alerta   Penicillamine Other (See Comments)   D-PENICILLAMINE *DO NOT CONFUSE WITH PENICILLIN*  REACTION: Myasthenia Gravis (MG) Medication Alert   Procainamide Other (See Comments)   REACTION: Myasthenia Gravis (MG)  Medication Alert   Propranolol Other (See Comments)   REACTION: Myasthenia Gravis (MG) Medication Alert   Quinidine Other (See Comments)   REACTION: Myasthenia Gravis (MG) Medication Alert   Quinine Derivatives Other (See Comments)   REACTION: Myasthenia Gravis (MG) Medication Alert   Streptomycin Other (See Comments)   REACTION:  Myasthenia Gravis (MG) Medication Alert   Timolol Other (See Comments)   REACTION: Myasthenia Gravis (MG) Medication Alert   Tobramycin Other (See Comments)   REACTION: Myasthenia Gravis (MG) Medication Alert   Xeomin [incobotulinumtoxina] Other (See Comments)   REACTION: Myasthenia Gravis (MG) Medication Alert   Zithromax [azithromycin] Other (See Comments)   REACTION: Myasthenia Gravis (MG) Medication Alert      Medication List       Accurate as of October 19, 2020  3:40 PM. If you have any questions, ask your nurse or doctor.        acyclovir 400 MG tablet Commonly known as: ZOVIRAX Take 800 mg by mouth daily.   amoxicillin-clavulanate 875-125 MG tablet Commonly known as: AUGMENTIN Take 1 tablet by mouth 2 (two) times daily.   apixaban 5 MG Tabs tablet Commonly known as: Eliquis Take 1 tablet (5 mg total) by mouth 2 (two) times daily.   azaTHIOprine 50 MG tablet Commonly known as: IMURAN Take 150 mg by mouth daily.   Calcium 600+D 600-200 MG-UNIT Tabs Generic drug: Calcium Carbonate-Vitamin D Take 2 tablets by mouth daily after lunch.   diltiazem 120 MG 24 hr capsule Commonly known as: CARDIZEM  CD TAKE (1) CAPSULE BY MOUTH ONCE DAILY.   Fish Oil Ultra 1400 MG Caps Take 1,400 mg by mouth daily after lunch.   losartan 50 MG tablet Commonly known as: COZAAR TAKE (1) TABLET BY MOUTH ONCE DAILY.   metFORMIN 500 MG 24 hr tablet Commonly known as: GLUCOPHAGE-XR Take 1,000 mg by mouth 2 (two) times daily.   omeprazole 20 MG capsule Commonly known as: PRILOSEC TAKE (1) CAPSULE BY MOUTH TWICE DAILY.   OneTouch Verio test strip  Generic drug: glucose blood 1 each 3 (three) times daily.   Saw Palmetto 450 MG Caps Take 2,250 mg by mouth daily after lunch.   Synthroid 137 MCG tablet Generic drug: levothyroxine Take 137 mcg by mouth daily before breakfast.   tamsulosin 0.4 MG Caps capsule Commonly known as: FLOMAX TAKE 1 CAPSULE BY MOUTH TWICE DAILY   vitamin C 1000 MG tablet Take 2,000 mg by mouth daily after lunch.   vitamin E 180 MG (400 UNITS) capsule Generic drug: vitamin E Take 800 Units by mouth daily after lunch.       Allergies:  Allergies  Allergen Reactions  . Levofloxacin Anaphylaxis  . Tequin [Gatifloxacin] Anaphylaxis  . Aminoglycosides Other (See Comments)    REACTION: Myasthenia Gravis (MG) Medication Alert  . Avelox [Moxifloxacin Hcl In Nacl] Other (See Comments)    REACTION: FLUOROQUINOLONE ANTIBIOTICS: Myasthenia Gravis (MG) Medication Alert  . Botox [Onabotulinumtoxina] Other (See Comments)    REACTION: Myasthenia Gravis (MG) Medication Alert  . Botulinum Toxins Other (See Comments)    REACTION: Myasthenia Gravis (MG) Medication Alert  . Calcium Channel Blockers Other (See Comments)    (BLOOD PRESSURE MEDICATION) REACTION: Myasthenia Gravis (MG) Medication Alert  . Ciprofloxacin Other (See Comments)    REACTION: FLUOROQUINOLONE ANTIBIOTICS: Myasthenia Gravis (MG) Medication Alert  . Contrast Media [Iodinated Diagnostic Agents] Other (See Comments)    MD told pt cannot have  . Curare [Tubocurarine] Other (See Comments)    (Usually only used during surgery)  REACTION: Myasthenia Gravis (MG) Medication Alert  . Dysport [Abobotulinumtoxina] Other (See Comments)    REACTION: Myasthenia Gravis (MG) Medication Alert  . Erythromycin Other (See Comments)    REACTION: Myasthenia Gravis (MG) Medication Alert  . Factive [Gemifloxacin] Other (See Comments)    REACTION: FLUOROQUINOLONE ANTIBIOTICS: Myasthenia Gravis (MG) Medication Alert  . Floxin [Ofloxacin] Other (See Comments)     REACTION: FLUOROQUINOLONE ANTIBIOTICS: Myasthenia Gravis (MG) Medication Alert  . Gentamycin [Gentamicin] Other (See Comments)    REACTION:Myasthenia Gravis (MG) Medication Alert  . Interferons Other (See Comments)    REACTION: Myasthenia Gravis (MG) Medication Alert  . Kanamycin Other (See Comments)    REACTION: Myasthenia Gravis (MG) Medication Alert  . Ketek [Telithromycin] Other (See Comments)    REACTION: Myasthenia Gravis (MG) Medication Alert  . Levaquin [Levofloxacin In D5w] Other (See Comments)    REACTION: FLUOROQUINOLONE ANTIBIOTICS: Myasthenia Gravis (MG) Medication Alert  . Limonene Other (See Comments)    REACTION:Myasthenia Gravis (MG) Medication Alert  . Macrolides And Ketolides Other (See Comments)    REACTION: Myasthenia Gravis (MG) Medication Alert REACTION: Myasthenia Gravis (MG) Medication Alert  . Magnesium-Containing Compounds Other (See Comments)    Unknown  . Myobloc [Rimabotulinumtoxinb] Other (See Comments)    REACTION: Myasthenia Gravis (MG) Medication Alert  . Neomycin Other (See Comments)    REACTION: Myasthenia Gravis (MG) Medication Alert  . Noroxin [Norfloxacin] Other (See Comments)    REACTION: FLUOROQUINOLONE ANTIBIOTICS: Myasthenia Gravis (MG) Medication Alert  . Other Other (See Comments)  MAGNESIUM SALTS: Milk of Magnesia, some antacids (Maalox, Mylanta) REACTION: Myasthenia Gravis (MG) Medication Alerta  . Penicillamine Other (See Comments)    D-PENICILLAMINE *DO NOT CONFUSE WITH PENICILLIN*  REACTION: Myasthenia Gravis (MG) Medication Alert  . Procainamide Other (See Comments)    REACTION: Myasthenia Gravis (MG) Medication Alert  . Propranolol Other (See Comments)    REACTION: Myasthenia Gravis (MG) Medication Alert  . Quinidine Other (See Comments)    REACTION: Myasthenia Gravis (MG) Medication Alert  . Quinine Derivatives Other (See Comments)    REACTION: Myasthenia Gravis (MG) Medication Alert  . Streptomycin Other (See Comments)     REACTION:  Myasthenia Gravis (MG) Medication Alert  . Timolol Other (See Comments)    REACTION: Myasthenia Gravis (MG) Medication Alert  . Tobramycin Other (See Comments)    REACTION: Myasthenia Gravis (MG) Medication Alert  . Xeomin [Incobotulinumtoxina] Other (See Comments)    REACTION: Myasthenia Gravis (MG) Medication Alert  . Zithromax [Azithromycin] Other (See Comments)    REACTION: Myasthenia Gravis (MG) Medication Alert    Family History: Family History  Problem Relation Age of Onset  . Liver disease Father        age 43, deceased  . Colon cancer Neg Hx     Social History:  reports that he has never smoked. He has never used smokeless tobacco. He reports that he does not drink alcohol and does not use drugs.  ROS: All other review of systems were reviewed and are negative except what is noted above in HPI  Physical Exam: BP 125/67   Pulse 98   Temp 98.2 F (36.8 C)   Ht 6\' 1"  (1.854 m)   Wt 217 lb 9.6 oz (98.7 kg)   BMI 28.71 kg/m   Constitutional:  Alert and oriented, No acute distress. HEENT: [place] AT, moist mucus membranes.  Trachea midline, no masses. Cardiovascular: No clubbing, cyanosis, or edema. Respiratory: Normal respiratory effort, no increased work of breathing. GI: Abdomen is soft, nontender, nondistended, no abdominal masses GU: No CVA tenderness. Circumcised phallus. No masses/lesions on penis, testis, scrotum. Prostate 40g smooth no nodules no induration.  Lymph: No cervical or inguinal lymphadenopathy. Skin: No rashes, bruises or suspicious lesions. Neurologic: Grossly intact, no focal deficits, moving all 4 extremities. Psychiatric: Normal mood and affect.  Laboratory Data: Lab Results  Component Value Date   WBC 6.6 09/09/2020   HGB 14.1 09/09/2020   HCT 43.1 09/09/2020   MCV 93.5 09/09/2020   PLT 201 09/09/2020    Lab Results  Component Value Date   CREATININE 1.22 09/09/2020    No results found for: PSA  No results found for:  TESTOSTERONE  Lab Results  Component Value Date   HGBA1C 7.1 (H) 06/07/2020    Urinalysis    Component Value Date/Time   COLORURINE AMBER (A) 09/14/2018 1251   APPEARANCEUR Clear 10/18/2020 0920   LABSPEC 1.027 09/14/2018 1251   PHURINE 6.0 09/14/2018 1251   GLUCOSEU Trace (A) 10/18/2020 0920   HGBUR NEGATIVE 09/14/2018 1251   BILIRUBINUR Negative 10/18/2020 0920   KETONESUR 20 (A) 09/14/2018 1251   PROTEINUR Trace (A) 10/18/2020 0920   PROTEINUR 30 (A) 09/14/2018 1251   UROBILINOGEN 0.2 04/01/2014 2010   NITRITE Negative 10/18/2020 0920   NITRITE NEGATIVE 09/14/2018 1251   LEUKOCYTESUR Negative 10/18/2020 0920    Lab Results  Component Value Date   LABMICR Comment 10/18/2020   BACTERIA NONE SEEN 09/14/2018    Pertinent Imaging:  Results for orders placed during the hospital  encounter of 07/16/09  DG Abd 1 View  Narrative Clinical Data: Left ureteral calculus.  ABDOMEN - 1 VIEW 07/16/2009:  Comparison: One-view abdomen x-ray 07/14/2009 and 06/30/2009.  CT abdomen and pelvis 05/19/2009.  Findings: Stable appearance to the 2 adjacent calculi in the distal left ureter.  These are adjacent to and phleboliths in the lower left pelvis as noted on the prior CT.  Phleboliths are also present on the right side of the pelvis.  No visible upper urinary tract calculi on either side.  Bowel gas pattern unremarkable. Degenerative changes again noted throughout the lumbar spine.  IMPRESSION: Stable appearance of the 2 adjacent distal left ureteral calculi.  Provider: Samule Ohm  No results found for this or any previous visit.  No results found for this or any previous visit.  No results found for this or any previous visit.  No results found for this or any previous visit.  No results found for this or any previous visit.  No results found for this or any previous visit.  Results for orders placed during the hospital encounter of 06/01/17  CT Renal Stone  Study  Narrative CLINICAL DATA:  Left flank pain  EXAM: CT ABDOMEN AND PELVIS WITHOUT CONTRAST  TECHNIQUE: Multidetector CT imaging of the abdomen and pelvis was performed following the standard protocol without IV contrast.  COMPARISON:  06/10/2014  FINDINGS: Lower chest: Lung bases are essentially clear.  Hepatobiliary: Mild hepatic steatosis.  Status post cholecystectomy. No intrahepatic or extrahepatic duct dilatation.  Pancreas: Within normal limits.  Spleen: Within normal limits. 15 mm splenule in the left upper abdomen.  Adrenals/Urinary Tract: Adrenal glands are within normal limits.  Kidneys are within normal limits. 2 mm nonobstructing right lower pole renal calculus (series 2/ image 48). 3 mm nonobstructing left lower pole renal calculus (series 2/image 52).  No ureteral or bladder calculi.  No hydronephrosis.  Bladder is within normal limits.  Stomach/Bowel: Stomach is within normal limits.  No evidence of bowel obstruction.  Normal appendix (series 2/image 63).  Left colonic diverticulosis, without evidence of diverticulitis.  Vascular/Lymphatic: No evidence of abdominal aortic aneurysm.  No suspicious abdominopelvic lymphadenopathy.  Reproductive: Prostate is unremarkable.  Other: No abdominopelvic ascites.  Small fat containing left inguinal hernia (series 2/image 93).  Musculoskeletal: Degenerative changes of the visualized thoracolumbar spine.  IMPRESSION: 2 mm nonobstructing right lower pole renal calculus. 3 mm nonobstructing left lower pole renal calculus. No ureteral or bladder calculi. No hydronephrosis.  Sigmoid diverticulosis, without evidence of diverticulitis.  Mild hepatic steatosis.   Electronically Signed By: Charline Bills M.D. On: 06/01/2017 15:33   Assessment & Plan:    1. Dysuria -likely related to prostatitis  2. BPH -flomax 0.4mg    3. Chronic prostatitis -bactrim DS BID for 3 weeks   No  follow-ups on file.  Wilkie Aye, MD  [facility]

## 2020-10-19 NOTE — Patient Instructions (Signed)
Prostatitis  Prostatitis is swelling of the prostate gland. The prostate helps to make semen. It is below a man's bladder, in front of the rectum. There are different types of prostatitis. Follow these instructions at home:   Take over-the-counter and prescription medicines only as told by your doctor.  If you were prescribed an antibiotic medicine, take it as told by your doctor. Do not stop taking the antibiotic even if you start to feel better.  If your doctor prescribed exercises, do them as directed.  Take sitz baths as told by your doctor. To take a sitz bath, sit in warm water that is deep enough to cover your hips and butt.  Keep all follow-up visits as told by your doctor. This is important. Contact a doctor if:  Your symptoms get worse.  You have a fever. Get help right away if:  You have chills.  You feel sick to your stomach (nauseous).  You throw up (vomit).  You feel light-headed.  You feel like you might pass out (faint).  You cannot pee (urinate).  You have blood or clumps of blood (blood clots) in your pee (urine). This information is not intended to replace advice given to you by your health care provider. Make sure you discuss any questions you have with your health care provider. Document Revised: 10/19/2017 Document Reviewed: 07/27/2016 Elsevier Patient Education  2020 [employer] Inc.  

## 2020-10-19 NOTE — Progress Notes (Signed)
Urological Symptom Review  Patient is experiencing the following symptoms: Hard to postpone urination Burning/pain with urination Urinary tract infection   Review of Systems  Gastrointestinal (upper)  : Negative for upper GI symptoms  Gastrointestinal (lower) : Negative for lower GI symptoms  Constitutional : Fever  Skin: Negative for skin symptoms  Eyes: Negative for eye symptoms  Ear/Nose/Throat : Negative for Ear/Nose/Throat symptoms  Hematologic/Lymphatic: Negative for Hematologic/Lymphatic symptoms  Cardiovascular : Negative for cardiovascular symptoms  Respiratory : Negative for respiratory symptoms  Endocrine: Negative for endocrine symptoms  Musculoskeletal: Negative for musculoskeletal symptoms  Neurological: Negative for neurological symptoms  Psychologic: Negative for psychiatric symptoms

## 2020-10-20 LAB — URINE CULTURE: Organism ID, Bacteria: NO GROWTH

## 2020-11-09 ENCOUNTER — Other Ambulatory Visit: Payer: Self-pay

## 2020-11-09 ENCOUNTER — Encounter: Payer: Self-pay | Admitting: Cardiology

## 2020-11-09 ENCOUNTER — Ambulatory Visit (INDEPENDENT_AMBULATORY_CARE_PROVIDER_SITE_OTHER): Payer: Managed Care, Other (non HMO) | Admitting: Cardiology

## 2020-11-09 VITALS — BP 122/70 | HR 90 | Ht 73.0 in | Wt 216.0 lb

## 2020-11-09 DIAGNOSIS — I4819 Other persistent atrial fibrillation: Secondary | ICD-10-CM

## 2020-11-09 DIAGNOSIS — G7000 Myasthenia gravis without (acute) exacerbation: Secondary | ICD-10-CM | POA: Diagnosis not present

## 2020-11-09 MED ORDER — DILTIAZEM HCL ER COATED BEADS 120 MG PO CP24
120.0000 mg | ORAL_CAPSULE | Freq: Every day | ORAL | 3 refills | Status: DC
Start: 2020-11-09 — End: 2021-11-29

## 2020-11-09 MED ORDER — LOSARTAN POTASSIUM 50 MG PO TABS
50.0000 mg | ORAL_TABLET | Freq: Every day | ORAL | 3 refills | Status: DC
Start: 2020-11-09 — End: 2021-09-05

## 2020-11-09 MED ORDER — APIXABAN 5 MG PO TABS
5.0000 mg | ORAL_TABLET | Freq: Two times a day (BID) | ORAL | 3 refills | Status: DC
Start: 2020-11-09 — End: 2021-11-16

## 2020-11-09 NOTE — Patient Instructions (Addendum)
Medication Instructions:  Your physician recommends that you continue on your current medications as directed. Please refer to the Current Medication list given to you today.  Labwork: None ordered.  Testing/Procedures: None ordered.  Follow-Up: Your physician wants you to follow-up in: 6 months with Dr. Lambert.   You will receive a reminder letter in the mail two months in advance. If you don't receive a letter, please call our office to schedule the follow-up appointment.   Any Other Special Instructions Will Be Listed Below (If Applicable).  If you need a refill on your cardiac medications before your next appointment, please call your pharmacy.   

## 2020-11-09 NOTE — Progress Notes (Signed)
Electrophysiology Office Follow up Visit Note:    Date:  11/09/2020   ID:  [name], DOB 03-22-1951, MRN [id number]  PCP:  Elfredia Nevins, MD  [facility] Cardiologist:  Prentice Docker, MD (Inactive)  [facility] Electrophysiologist:  Lanier Prude, MD    Interval History:    [name] is a 69 y.o. male who presents for a follow up visit.  He underwent a successful atrial fibrillation ablation on August 12, 2020 that included a pulmonary vein isolation plus posterior wall isolation.  He did have an early episode of atrial fibrillation requiring cardioversion which occurred on September 16, 2020.  Since his cardioversion on September 16, 2020, the patient has maintained normal rhythm.  He tells me he is feeling significantly better in normal rhythm.  He is back to a high level of activity without limitations.  While he was in atrial fibrillation he was significantly limited.  He is overall very happy with the result.     Past Medical History:  Diagnosis Date  . Candida esophagitis (HCC) OCT 2014  . Deafness in right ear   . Diabetes mellitus without complication (HCC)   . Difficult intubation   . Esophageal stricture SEP 2011   FOOD IMPACTION-->SAV DIL 16 MM  . GERD (gastroesophageal reflux disease)   . Hypertension   . Kidney stone   . Myasthenia gravis (HCC)   . Sleep apnea   . Thyroid disease     Past Surgical History:  Procedure Laterality Date  . ATRIAL FIBRILLATION ABLATION N/A 08/12/2020   Procedure: ATRIAL FIBRILLATION ABLATION;  Surgeon: Lanier Prude, MD;  Location: [facility] INVASIVE CV LAB;  Service: Cardiovascular;  Laterality: N/A;  . BIOPSY  02/14/2018   Procedure: BIOPSY;  Surgeon: West Bali, MD;  Location: [facility] ENDO SU[facility];  Service: Endoscopy;;  gastric  . CARDIOVERSION N/A 06/08/2020   Procedure: CARDIOVERSION;  Surgeon: Antoine Poche, MD;  Location: [facility];  Service: Endoscopy;  Laterality: N/A;  . CARDIOVERSION N/A  09/16/2020   Procedure: CARDIOVERSION;  Surgeon: Meriam Sprague, MD;  Location: [facility] ENDOSCOPY;  Service: Cardiovascular;  Laterality: N/A;  . CHOLECYSTECTOMY    . COLONOSCOPY  2005   Dr. Lovell Sheehan  . COLONOSCOPY N/A 07/17/2014   Procedure: COLONOSCOPY;  Surgeon: West Bali, MD;  Location: [facility];  Service: Endoscopy;  Laterality: N/A;  9:30-rescheduled 8/28 same time Soledad Gerlach to notify pt  . ESOPHAGOGASTRODUODENOSCOPY N/A 07/25/2013   Shallow ulcer in the cardia, mid esophageal web, stricture at GE junction  . ESOPHAGOGASTRODUODENOSCOPY N/A 02/14/2018   Procedure: ESOPHAGOGASTRODUODENOSCOPY (EGD);  Surgeon: West Bali, MD;  Location: [facility];  Service: Endoscopy;  Laterality: N/A;  11:15am  . ESOPHAGOGASTRODUODENOSCOPY (EGD) WITH ESOPHAGEAL DILATION N/A 08/29/2013   Candida esophagitis, small hiatal hernia, moderate nonerosive gastritis, mid esophageal web  . HERNIA REPAIR     right inguinal  . KIDNEY STONE SURGERY    . NASAL SEPTUM SURGERY    . PALATE SURGERY        . SAVORY DILATION N/A 02/14/2018   Procedure: SAVORY DILATION;  Surgeon: West Bali, MD;  Location: [facility];  Service: Endoscopy;  Laterality: N/A;  . TEE WITHOUT CARDIOVERSION N/A 08/10/2020   Procedure: TRANSESOPHAGEAL ECHOCARDIOGRAM (TEE);  Surgeon: Chilton Si, MD;  Location: [facility] ENDOSCOPY;  Service: Cardiovascular;  Laterality: N/A;  . THYMECTOMY    . TONSILLECTOMY    . UPPER GASTROINTESTINAL ENDOSCOPY  SEP 2011   SAV DIL  Current Medications: Current Meds  Medication Sig  . acyclovir (ZOVIRAX) 400 MG tablet Take 800 mg by mouth daily.   [name] amoxicillin-clavulanate (AUGMENTIN) 875-125 MG tablet Take 1 tablet by mouth 2 (two) times daily.  . Ascorbic Acid (VITAMIN C) 1000 MG tablet Take 2,000 mg by mouth daily after lunch.   . azaTHIOprine (IMURAN) 50 MG tablet Take 150 mg by mouth daily.   . Calcium Carbonate-Vitamin D (CALCIUM 600+D) 600-200 MG-UNIT TABS Take 2 tablets by  mouth daily after lunch.   . metFORMIN (GLUCOPHAGE-XR) 500 MG 24 hr tablet Take 1,000 mg by mouth 2 (two) times daily.  . Omega-3 Fatty Acids (FISH OIL ULTRA) 1400 MG CAPS Take 1,400 mg by mouth daily after lunch.  [name] omeprazole (PRILOSEC) 20 MG capsule TAKE (1) CAPSULE BY MOUTH TWICE DAILY.  [name] ONETOUCH VERIO test strip 1 each 3 (three) times daily.  . Saw Palmetto 450 MG CAPS Take 2,250 mg by mouth daily after lunch.  . sulfamethoxazole-trimethoprim (BACTRIM DS) 800-160 MG tablet Take 1 tablet by mouth every 12 (twelve) hours.  [name] SYNTHROID 137 MCG tablet Take 137 mcg by mouth daily before breakfast.  . tamsulosin (FLOMAX) 0.4 MG CAPS capsule TAKE 1 CAPSULE BY MOUTH TWICE DAILY  . vitamin E 180 MG (400 UNITS) capsule Take 800 Units by mouth daily after lunch.  . [DISCONTINUED] apixaban (ELIQUIS) 5 MG TABS tablet Take 1 tablet (5 mg total) by mouth 2 (two) times daily.  . [DISCONTINUED] diltiazem (CARDIZEM CD) 120 MG 24 hr capsule TAKE (1) CAPSULE BY MOUTH ONCE DAILY.  . [DISCONTINUED] losartan (COZAAR) 50 MG tablet TAKE (1) TABLET BY MOUTH ONCE DAILY.     Allergies:   Levofloxacin, Tequin [gatifloxacin], Aminoglycosides, Avelox [moxifloxacin hcl in nacl], Botox [onabotulinumtoxina], Botulinum toxins, Calcium channel blockers, Ciprofloxacin, Contrast media [iodinated diagnostic agents], Curare [tubocurarine], Dysport [abobotulinumtoxina], Erythromycin, Factive [gemifloxacin], Floxin [ofloxacin], Gentamycin [gentamicin], Interferons, Kanamycin, Ketek [telithromycin], Levaquin [levofloxacin in d5w], Limonene, Macrolides and ketolides, Magnesium-containing compounds, Myobloc [rimabotulinumtoxinb], Neomycin, Noroxin [norfloxacin], Other, Penicillamine, Procainamide, Propranolol, Quinidine, Quinine derivatives, Streptomycin, Timolol, Tobramycin, Xeomin [incobotulinumtoxina], and Zithromax [azithromycin]   Social History   Socioeconomic History  . Marital status: Married    Spouse name: Not on file  .  Number of children: Not on file  . Years of education: Not on file  . Highest education level: Not on file  Occupational History  . Not on file  Tobacco Use  . Smoking status: Never Smoker  . Smokeless tobacco: Never Used  Vaping Use  . Vaping Use: Never used  Substance and Sexual Activity  . Alcohol use: No  . Drug use: No  . Sexual activity: Yes    Partners: Female    Birth control/protection: None    Comment: spouse  Other Topics Concern  . Not on file  Social History Narrative  . Not on file   Social Determinants of Health   Financial Resource Strain: Not on file  Food Insecurity: Not on file  Transportation Needs: Not on file  Physical Activity: Not on file  Stress: Not on file  Social Connections: Not on file     Family History: The patient's family history includes Liver disease in his father. There is no history of Colon cancer.  ROS:   Please see the history of present illness.    All other systems reviewed and are negative.  EKGs/Labs/Other Studies Reviewed:    The following studies were reviewed today: Prior notes, A. fib ablation notes  EKG:  The  ekg ordered today demonstrates normal sinus rhythm.  Recent Labs: 09/09/2020: BUN 11; Creatinine, Ser 1.22; Hemoglobin 14.1; Platelets 201; Potassium 4.2; Sodium 137  Recent Lipid Panel No results found for: CHOL, TRIG, HDL, CHOLHDL, VLDL, LDLCALC, LDLDIRECT  Physical Exam:    VS:  BP 122/70   Pulse 90   Ht 6\' 1"  (1.854 m)   Wt 216 lb (98 kg)   SpO2 97%   BMI 28.50 kg/m     Wt Readings from Last 3 Encounters:  11/09/20 216 lb (98 kg)  10/19/20 217 lb 9.6 oz (98.7 kg)  09/21/20 217 lb 9.6 oz (98.7 kg)     GEN:  Well nourished, well developed in no acute distress HEENT: Normal NECK: No JVD; No carotid bruits LYMPHATICS: No lymphadenopathy CARDIAC: RRR, no murmurs, rubs, gallops RESPIRATORY:  Clear to auscultation without rales, wheezing or rhonchi  ABDOMEN: Soft, non-tender,  non-distended MUSCULOSKELETAL:  No edema; No deformity  SKIN: Warm and dry NEUROLOGIC:  Alert and oriented x 3 PSYCHIATRIC:  Normal affect   ASSESSMENT:    1. Persistent atrial fibrillation (HCC)   2. Myasthenia gravis (HCC)    PLAN:    In order of problems listed above:  1. Persistent atrial fibrillation Is maintaining normal sinus rhythm after cardioversion 1 month after his ablation.  He continues to take Eliquis for stroke prophylaxis.  We will plan on continuing his diltiazem for now.  We agreed to revisit the diltiazem prescription at the 74-month follow-up appointment to see if we could safely discontinue it.  I think if he maintains normal rhythm until that appointment it is reasonable to try to stop the medication.  He will continue his Eliquis for the foreseeable future.  2.  Myasthenia gravis Patient's energy level seems to be doing well now that he is in normal rhythm.  Medication Adjustments/Labs and Tests Ordered: Current medicines are reviewed at length with the patient today.  Concerns regarding medicines are outlined above.  Orders Placed This Encounter  Procedures  . EKG 12-Lead   Meds ordered this encounter  Medications  . apixaban (ELIQUIS) 5 MG TABS tablet    Sig: Take 1 tablet (5 mg total) by mouth 2 (two) times daily.    Dispense:  180 tablet    Refill:  3    Pt has 30 DAY FREE Trial card he needs to activate  . diltiazem (CARDIZEM CD) 120 MG 24 hr capsule    Sig: Take 1 capsule (120 mg total) by mouth daily.    Dispense:  90 capsule    Refill:  3  . losartan (COZAAR) 50 MG tablet    Sig: Take 1 tablet (50 mg total) by mouth daily.    Dispense:  90 tablet    Refill:  3     Signed, 8-month, MD, [facility]  11/09/2020 6:21 PM    Electrophysiology [facility]

## 2020-11-16 ENCOUNTER — Other Ambulatory Visit: Payer: Self-pay | Admitting: Urology

## 2020-12-13 ENCOUNTER — Inpatient Hospital Stay: Admission: RE | Admit: 2020-12-13 | Payer: Self-pay | Source: Ambulatory Visit

## 2020-12-13 ENCOUNTER — Ambulatory Visit
Admission: EM | Admit: 2020-12-13 | Discharge: 2020-12-13 | Disposition: A | Discharge status: Home / Self Care | Payer: Managed Care, Other (non HMO) | Attending: Emergency Medicine | Admitting: Emergency Medicine

## 2020-12-13 DIAGNOSIS — Z20822 Contact with and (suspected) exposure to covid-19: Secondary | ICD-10-CM

## 2020-12-13 NOTE — ED Triage Notes (Signed)
Here for covid test only 

## 2020-12-14 LAB — NOVEL CORONAVIRUS, NAA: SARS-CoV-2, NAA: DETECTED — AB

## 2020-12-14 LAB — SARS-COV-2, NAA 2 DAY TAT

## 2020-12-15 ENCOUNTER — Telehealth: Payer: Self-pay | Admitting: *Deleted

## 2020-12-15 NOTE — Telephone Encounter (Signed)
Called to discuss with patient about COVID-19 symptoms and the use of one of the available treatments for those with mild to moderate Covid symptoms and at a high risk of hospitalization.  Pt appears to qualify for outpatient treatment due to co-morbid conditions and/or a member of an at-risk group in accordance with the FDA Emergency Use Authorization.    Patient reported he took ivermectin beginning yesterday and feels awesome today.      Karsten Fells

## 2020-12-21 ENCOUNTER — Emergency Department (HOSPITAL_COMMUNITY): Payer: Managed Care, Other (non HMO)

## 2020-12-21 ENCOUNTER — Other Ambulatory Visit: Payer: Self-pay

## 2020-12-21 ENCOUNTER — Inpatient Hospital Stay (HOSPITAL_COMMUNITY)
Admission: EM | Admit: 2020-12-21 | Discharge: 2020-12-25 | DRG: 177 | Disposition: A | Discharge status: Home / Self Care | Payer: Managed Care, Other (non HMO) | Attending: Internal Medicine | Admitting: Internal Medicine

## 2020-12-21 ENCOUNTER — Encounter (HOSPITAL_COMMUNITY): Payer: Self-pay | Admitting: Emergency Medicine

## 2020-12-21 DIAGNOSIS — Z87442 Personal history of urinary calculi: Secondary | ICD-10-CM

## 2020-12-21 DIAGNOSIS — E039 Hypothyroidism, unspecified: Secondary | ICD-10-CM | POA: Diagnosis present

## 2020-12-21 DIAGNOSIS — G4730 Sleep apnea, unspecified: Secondary | ICD-10-CM | POA: Diagnosis present

## 2020-12-21 DIAGNOSIS — N400 Enlarged prostate without lower urinary tract symptoms: Secondary | ICD-10-CM | POA: Diagnosis present

## 2020-12-21 DIAGNOSIS — N179 Acute kidney failure, unspecified: Secondary | ICD-10-CM | POA: Diagnosis present

## 2020-12-21 DIAGNOSIS — G7000 Myasthenia gravis without (acute) exacerbation: Secondary | ICD-10-CM | POA: Diagnosis present

## 2020-12-21 DIAGNOSIS — Z888 Allergy status to other drugs, medicaments and biological substances status: Secondary | ICD-10-CM

## 2020-12-21 DIAGNOSIS — U071 COVID-19: Principal | ICD-10-CM

## 2020-12-21 DIAGNOSIS — I4819 Other persistent atrial fibrillation: Secondary | ICD-10-CM | POA: Diagnosis present

## 2020-12-21 DIAGNOSIS — E1165 Type 2 diabetes mellitus with hyperglycemia: Secondary | ICD-10-CM

## 2020-12-21 DIAGNOSIS — Z881 Allergy status to other antibiotic agents status: Secondary | ICD-10-CM

## 2020-12-21 DIAGNOSIS — D84821 Immunodeficiency due to drugs: Secondary | ICD-10-CM | POA: Diagnosis present

## 2020-12-21 DIAGNOSIS — J1282 Pneumonia due to coronavirus disease 2019: Secondary | ICD-10-CM | POA: Diagnosis present

## 2020-12-21 DIAGNOSIS — Z79899 Other long term (current) drug therapy: Secondary | ICD-10-CM

## 2020-12-21 DIAGNOSIS — Z7901 Long term (current) use of anticoagulants: Secondary | ICD-10-CM

## 2020-12-21 DIAGNOSIS — J9601 Acute respiratory failure with hypoxia: Secondary | ICD-10-CM | POA: Diagnosis present

## 2020-12-21 DIAGNOSIS — H9191 Unspecified hearing loss, right ear: Secondary | ICD-10-CM | POA: Diagnosis present

## 2020-12-21 DIAGNOSIS — K219 Gastro-esophageal reflux disease without esophagitis: Secondary | ICD-10-CM | POA: Diagnosis present

## 2020-12-21 DIAGNOSIS — Z7984 Long term (current) use of oral hypoglycemic drugs: Secondary | ICD-10-CM

## 2020-12-21 DIAGNOSIS — I10 Essential (primary) hypertension: Secondary | ICD-10-CM | POA: Diagnosis present

## 2020-12-21 DIAGNOSIS — J069 Acute upper respiratory infection, unspecified: Secondary | ICD-10-CM | POA: Diagnosis not present

## 2020-12-21 LAB — LACTIC ACID, PLASMA
Lactic Acid, Venous: 2.9 mmol/L (ref 0.5–1.9)
Lactic Acid, Venous: 1.3 mmol/L (ref 0.5–1.9)

## 2020-12-21 LAB — CBC WITH DIFFERENTIAL/PLATELET
Abs Immature Granulocytes: 0.16 10*3/uL — ABNORMAL HIGH (ref 0.00–0.07)
Basophils Absolute: 0.1 10*3/uL (ref 0.0–0.1)
Basophils Relative: 1 %
Eosinophils Absolute: 0 10*3/uL (ref 0.0–0.5)
Eosinophils Relative: 0 %
HCT: 42.6 % (ref 39.0–52.0)
Hemoglobin: 14.2 g/dL (ref 13.0–17.0)
Immature Granulocytes: 2 %
Lymphocytes Relative: 4 %
Lymphs Abs: 0.3 10*3/uL — ABNORMAL LOW (ref 0.7–4.0)
MCH: 31.8 pg (ref 26.0–34.0)
MCHC: 33.3 g/dL (ref 30.0–36.0)
MCV: 95.5 fL (ref 80.0–100.0)
Monocytes Absolute: 1 10*3/uL (ref 0.1–1.0)
Monocytes Relative: 12 %
Neutro Abs: 7 10*3/uL (ref 1.7–7.7)
Neutrophils Relative %: 81 %
Platelets: 193 10*3/uL (ref 150–400)
RBC: 4.46 MIL/uL (ref 4.22–5.81)
RDW: 15.5 % (ref 11.5–15.5)
WBC: 8.6 10*3/uL (ref 4.0–10.5)
nRBC: 0 % (ref 0.0–0.2)

## 2020-12-21 LAB — COMPREHENSIVE METABOLIC PANEL
ALT: 37 U/L (ref 0–44)
AST: 56 U/L — ABNORMAL HIGH (ref 15–41)
Albumin: 4 g/dL (ref 3.5–5.0)
Alkaline Phosphatase: 81 U/L (ref 38–126)
Anion gap: 11 (ref 5–15)
BUN: 20 mg/dL (ref 8–23)
CO2: 24 mmol/L (ref 22–32)
Calcium: 9 mg/dL (ref 8.9–10.3)
Chloride: 98 mmol/L (ref 98–111)
Creatinine, Ser: 1.46 mg/dL — ABNORMAL HIGH (ref 0.61–1.24)
GFR, Estimated: 52 mL/min — ABNORMAL LOW (ref >60)
Glucose, Bld: 172 mg/dL — ABNORMAL HIGH (ref 70–99)
Potassium: 4 mmol/L (ref 3.5–5.1)
Sodium: 133 mmol/L — ABNORMAL LOW (ref 135–145)
Total Bilirubin: 2 mg/dL — ABNORMAL HIGH (ref 0.3–1.2)
Total Protein: 7.7 g/dL (ref 6.5–8.1)

## 2020-12-21 LAB — LACTATE DEHYDROGENASE: LDH: 299 U/L — ABNORMAL HIGH (ref 98–192)

## 2020-12-21 LAB — CBG MONITORING, ED: Glucose-Capillary: 163 mg/dL — ABNORMAL HIGH (ref 70–99)

## 2020-12-21 LAB — FERRITIN: Ferritin: 543 ng/mL — ABNORMAL HIGH (ref 24–336)

## 2020-12-21 LAB — C-REACTIVE PROTEIN: CRP: 19.3 mg/dL — ABNORMAL HIGH (ref <1.0)

## 2020-12-21 LAB — GLUCOSE, CAPILLARY
Glucose-Capillary: 222 mg/dL — ABNORMAL HIGH (ref 70–99)
Glucose-Capillary: 292 mg/dL — ABNORMAL HIGH (ref 70–99)

## 2020-12-21 LAB — TRIGLYCERIDES: Triglycerides: 101 mg/dL (ref <150)

## 2020-12-21 LAB — FIBRINOGEN: Fibrinogen: 674 mg/dL — ABNORMAL HIGH (ref 210–475)

## 2020-12-21 LAB — PROCALCITONIN: Procalcitonin: 0.29 ng/mL

## 2020-12-21 LAB — D-DIMER, QUANTITATIVE: D-Dimer, Quant: 1.67 ug/mL-FEU — ABNORMAL HIGH (ref 0.00–0.50)

## 2020-12-21 LAB — HIV ANTIBODY (ROUTINE TESTING W REFLEX): HIV Screen 4th Generation wRfx: NONREACTIVE

## 2020-12-21 MED ORDER — THIAMINE HCL 100 MG PO TABS
100.0000 mg | ORAL_TABLET | Freq: Every day | ORAL | Status: DC
  Administered 2020-12-21 – 2020-12-25 (×5): 100 mg via ORAL
  Filled 2020-12-21 (×5): qty 1

## 2020-12-21 MED ORDER — VITAMIN E 180 MG (400 UNIT) PO CAPS
800.0000 [IU] | ORAL_CAPSULE | Freq: Every day | ORAL | Status: DC
  Administered 2020-12-22 – 2020-12-25 (×4): 800 [IU] via ORAL
  Filled 2020-12-21 (×5): qty 2

## 2020-12-21 MED ORDER — BARICITINIB 2 MG PO TABS
4.0000 mg | ORAL_TABLET | Freq: Every day | ORAL | Status: DC
  Administered 2020-12-21: 4 mg via ORAL
  Filled 2020-12-21: qty 2

## 2020-12-21 MED ORDER — ACYCLOVIR 800 MG PO TABS
800.0000 mg | ORAL_TABLET | Freq: Every day | ORAL | Status: DC
  Administered 2020-12-21 – 2020-12-25 (×5): 800 mg via ORAL
  Filled 2020-12-21 (×5): qty 1

## 2020-12-21 MED ORDER — ACETAMINOPHEN 325 MG PO TABS
650.0000 mg | ORAL_TABLET | Freq: Once | ORAL | Status: AC
  Administered 2020-12-21: 650 mg via ORAL
  Filled 2020-12-21: qty 2

## 2020-12-21 MED ORDER — LEVOTHYROXINE SODIUM 137 MCG PO TABS
137.0000 ug | ORAL_TABLET | Freq: Every day | ORAL | Status: DC
  Administered 2020-12-22 – 2020-12-25 (×3): 137 ug via ORAL
  Filled 2020-12-21 (×4): qty 1

## 2020-12-21 MED ORDER — SODIUM CHLORIDE 0.9% FLUSH: 3.0000 mL | INTRAVENOUS | Status: DC | PRN

## 2020-12-21 MED ORDER — HYDROCOD POLST-CPM POLST ER 10-8 MG/5ML PO SUER: 5.0000 mL | Freq: Two times a day (BID) | ORAL | Status: DC | PRN

## 2020-12-21 MED ORDER — CALCIUM CARBONATE-VITAMIN D 500-200 MG-UNIT PO TABS
2.0000 | ORAL_TABLET | Freq: Every day | ORAL | Status: DC
  Administered 2020-12-22 – 2020-12-25 (×4): 2 via ORAL
  Filled 2020-12-21 (×4): qty 2

## 2020-12-21 MED ORDER — INSULIN ASPART 100 UNIT/ML ~~LOC~~ SOLN
0.0000 [IU] | Freq: Every day | SUBCUTANEOUS | Status: DC
  Administered 2020-12-21 – 2020-12-22 (×2): 3 [IU] via SUBCUTANEOUS
  Administered 2020-12-23: 2 [IU] via SUBCUTANEOUS
  Administered 2020-12-24: 3 [IU] via SUBCUTANEOUS

## 2020-12-21 MED ORDER — PANTOPRAZOLE SODIUM 40 MG PO TBEC
40.0000 mg | DELAYED_RELEASE_TABLET | Freq: Every day | ORAL | Status: DC
  Administered 2020-12-22 – 2020-12-25 (×4): 40 mg via ORAL
  Filled 2020-12-21 (×4): qty 1

## 2020-12-21 MED ORDER — SODIUM CHLORIDE 0.9% FLUSH
3.0000 mL | Freq: Two times a day (BID) | INTRAVENOUS | Status: DC
  Administered 2020-12-21 – 2020-12-25 (×8): 3 mL via INTRAVENOUS

## 2020-12-21 MED ORDER — ZINC SULFATE 220 (50 ZN) MG PO CAPS
220.0000 mg | ORAL_CAPSULE | Freq: Every day | ORAL | Status: DC
  Administered 2020-12-21 – 2020-12-25 (×5): 220 mg via ORAL
  Filled 2020-12-21 (×6): qty 1

## 2020-12-21 MED ORDER — SODIUM CHLORIDE 0.9% FLUSH
3.0000 mL | Freq: Two times a day (BID) | INTRAVENOUS | Status: DC
  Administered 2020-12-21 – 2020-12-24 (×6): 3 mL via INTRAVENOUS

## 2020-12-21 MED ORDER — ACETAMINOPHEN 650 MG RE SUPP: 650.0000 mg | Freq: Four times a day (QID) | RECTAL | Status: DC | PRN

## 2020-12-21 MED ORDER — ACETAMINOPHEN 325 MG PO TABS
650.0000 mg | ORAL_TABLET | Freq: Four times a day (QID) | ORAL | Status: DC | PRN
  Administered 2020-12-22: 650 mg via ORAL
  Filled 2020-12-21: qty 2

## 2020-12-21 MED ORDER — ASCORBIC ACID 500 MG PO TABS
500.0000 mg | ORAL_TABLET | Freq: Every day | ORAL | Status: DC
  Administered 2020-12-21 – 2020-12-25 (×5): 500 mg via ORAL
  Filled 2020-12-21 (×6): qty 1

## 2020-12-21 MED ORDER — SODIUM CHLORIDE 0.9 % IV SOLN
100.0000 mg | INTRAVENOUS | Status: AC
  Administered 2020-12-21 (×2): 100 mg via INTRAVENOUS
  Filled 2020-12-21 (×2): qty 20

## 2020-12-21 MED ORDER — POLYETHYLENE GLYCOL 3350 17 G PO PACK: 17.0000 g | PACK | Freq: Every day | ORAL | Status: DC | PRN

## 2020-12-21 MED ORDER — SODIUM CHLORIDE 0.9 % IV SOLN
100.0000 mg | Freq: Every day | INTRAVENOUS | Status: AC
  Administered 2020-12-22 – 2020-12-25 (×4): 100 mg via INTRAVENOUS
  Filled 2020-12-21 (×4): qty 20

## 2020-12-21 MED ORDER — BARICITINIB 2 MG PO TABS
4.0000 mg | ORAL_TABLET | Freq: Every day | ORAL | Status: DC
  Administered 2020-12-22 – 2020-12-25 (×4): 4 mg via ORAL
  Filled 2020-12-21 (×4): qty 2

## 2020-12-21 MED ORDER — ASCORBIC ACID 500 MG PO TABS
2000.0000 mg | ORAL_TABLET | Freq: Every day | ORAL | Status: DC
  Administered 2020-12-22 – 2020-12-25 (×4): 2000 mg via ORAL
  Filled 2020-12-21 (×4): qty 4

## 2020-12-21 MED ORDER — METHYLPREDNISOLONE SODIUM SUCC 125 MG IJ SOLR
125.0000 mg | Freq: Three times a day (TID) | INTRAMUSCULAR | Status: DC
  Administered 2020-12-21: 125 mg via INTRAVENOUS
  Filled 2020-12-21: qty 2

## 2020-12-21 MED ORDER — BUDESONIDE 0.5 MG/2ML IN SUSP: 0.5000 mg | Freq: Four times a day (QID) | RESPIRATORY_TRACT | Status: DC | PRN

## 2020-12-21 MED ORDER — SODIUM CHLORIDE 0.9 % IV SOLN: 250.0000 mL | INTRAVENOUS | Status: DC | PRN

## 2020-12-21 MED ORDER — INSULIN DETEMIR 100 UNIT/ML ~~LOC~~ SOLN
10.0000 [IU] | Freq: Two times a day (BID) | SUBCUTANEOUS | Status: DC
  Administered 2020-12-21: 10 [IU] via SUBCUTANEOUS
  Filled 2020-12-21 (×2): qty 0.1

## 2020-12-21 MED ORDER — DILTIAZEM HCL ER COATED BEADS 120 MG PO CP24
120.0000 mg | ORAL_CAPSULE | Freq: Every day | ORAL | Status: DC
  Administered 2020-12-22 – 2020-12-25 (×4): 120 mg via ORAL
  Filled 2020-12-21 (×4): qty 1

## 2020-12-21 MED ORDER — ALBUTEROL SULFATE HFA 108 (90 BASE) MCG/ACT IN AERS
2.0000 | INHALATION_SPRAY | Freq: Four times a day (QID) | RESPIRATORY_TRACT | Status: DC
  Administered 2020-12-21 – 2020-12-22 (×6): 2 via RESPIRATORY_TRACT
  Filled 2020-12-21: qty 6.7

## 2020-12-21 MED ORDER — BISACODYL 10 MG RE SUPP
10.0000 mg | Freq: Every day | RECTAL | Status: DC | PRN
  Filled 2020-12-21: qty 1

## 2020-12-21 MED ORDER — ADULT MULTIVITAMIN W/MINERALS CH
1.0000 | ORAL_TABLET | Freq: Every day | ORAL | Status: DC
  Administered 2020-12-21 – 2020-12-25 (×5): 1 via ORAL
  Filled 2020-12-21 (×5): qty 1

## 2020-12-21 MED ORDER — ONDANSETRON HCL 4 MG/2ML IJ SOLN: 4.0000 mg | Freq: Four times a day (QID) | INTRAMUSCULAR | Status: DC | PRN

## 2020-12-21 MED ORDER — FOLIC ACID 1 MG PO TABS
1.0000 mg | ORAL_TABLET | Freq: Every day | ORAL | Status: DC
  Administered 2020-12-21 – 2020-12-25 (×5): 1 mg via ORAL
  Filled 2020-12-21 (×5): qty 1

## 2020-12-21 MED ORDER — INSULIN ASPART 100 UNIT/ML ~~LOC~~ SOLN
0.0000 [IU] | Freq: Three times a day (TID) | SUBCUTANEOUS | Status: DC
  Administered 2020-12-21: 2 [IU] via SUBCUTANEOUS
  Administered 2020-12-21: 3 [IU] via SUBCUTANEOUS
  Administered 2020-12-22: 2 [IU] via SUBCUTANEOUS
  Administered 2020-12-22 (×2): 3 [IU] via SUBCUTANEOUS
  Administered 2020-12-23: 2 [IU] via SUBCUTANEOUS
  Administered 2020-12-23: 6 [IU] via SUBCUTANEOUS
  Administered 2020-12-23: 3 [IU] via SUBCUTANEOUS
  Administered 2020-12-24: 9 [IU] via SUBCUTANEOUS
  Administered 2020-12-24: 3 [IU] via SUBCUTANEOUS
  Administered 2020-12-24: 2 [IU] via SUBCUTANEOUS
  Administered 2020-12-25: 3 [IU] via SUBCUTANEOUS
  Administered 2020-12-25: 2 [IU] via SUBCUTANEOUS
  Filled 2020-12-21: qty 1

## 2020-12-21 MED ORDER — ONDANSETRON HCL 4 MG PO TABS: 4.0000 mg | ORAL_TABLET | Freq: Four times a day (QID) | ORAL | Status: DC | PRN

## 2020-12-21 MED ORDER — TAMSULOSIN HCL 0.4 MG PO CAPS
0.4000 mg | ORAL_CAPSULE | Freq: Every day | ORAL | Status: DC
  Administered 2020-12-22 – 2020-12-24 (×3): 0.4 mg via ORAL
  Filled 2020-12-21 (×3): qty 1

## 2020-12-21 MED ORDER — TRAZODONE HCL 50 MG PO TABS
50.0000 mg | ORAL_TABLET | Freq: Every evening | ORAL | Status: DC | PRN
Start: 2020-12-21 — End: 2020-12-25

## 2020-12-21 MED ORDER — GUAIFENESIN-DM 100-10 MG/5ML PO SYRP: 10.0000 mL | ORAL_SOLUTION | ORAL | Status: DC | PRN

## 2020-12-21 MED ORDER — OMEGA-3-ACID ETHYL ESTERS 1 G PO CAPS
2.0000 | ORAL_CAPSULE | Freq: Every day | ORAL | Status: DC
  Administered 2020-12-22 – 2020-12-25 (×4): 2 g via ORAL
  Filled 2020-12-21 (×4): qty 2

## 2020-12-21 MED ORDER — AZATHIOPRINE 50 MG PO TABS
150.0000 mg | ORAL_TABLET | Freq: Every day | ORAL | Status: DC
  Administered 2020-12-22 – 2020-12-25 (×4): 150 mg via ORAL
  Filled 2020-12-21 (×7): qty 3

## 2020-12-21 MED ORDER — APIXABAN 5 MG PO TABS
5.0000 mg | ORAL_TABLET | Freq: Two times a day (BID) | ORAL | Status: DC
  Administered 2020-12-21 – 2020-12-25 (×8): 5 mg via ORAL
  Filled 2020-12-21 (×8): qty 1

## 2020-12-21 MED ORDER — METHYLPREDNISOLONE SODIUM SUCC 125 MG IJ SOLR
60.0000 mg | Freq: Two times a day (BID) | INTRAMUSCULAR | Status: DC
  Administered 2020-12-22 – 2020-12-25 (×5): 60 mg via INTRAVENOUS
  Filled 2020-12-21 (×6): qty 2

## 2020-12-21 NOTE — ED Notes (Signed)
CRITICAL VALUE ALERT  Critical Value:  Lactic 2.9  Date & Time Notied:  12/21/2020 1027  Provider Notified: Dr. Bernette Mayers  Orders Received/Actions taken: see chart

## 2020-12-21 NOTE — ED Triage Notes (Signed)
Pt presents with [facility] today. Pt states he had positive COVID test on 12/13/2020. Since that time pt has became [facility].

## 2020-12-21 NOTE — ED Provider Notes (Signed)
[facility] EMERGENCY DEPARTMENT Provider Note  CSN: [id number] Arrival date & time: 12/21/20 0930    History Chief Complaint  Patient presents with  . Shortness of Breath    HPI  [name] is a 70 y.o. male with history of DM, Afib, HTN reports he began having Covid symptoms about 10 days ago while on a vacation, he was tested when he returned home 8 days ago and was positive. He was initially doing well but has begun having more fevers, chills, cough and body aches. He has not been hypoxic until today. He has been taking OTC meds for his symptoms as well as Augmentin prescribed by his PCP without improvement. Noted to be hypoxic in the 70s in triage, currently on 4L [place].    Past Medical History:  Diagnosis Date  . Candida esophagitis (HCC) OCT 2014  . Deafness in right ear   . Diabetes mellitus without complication (HCC)   . Difficult intubation   . Esophageal stricture SEP 2011   FOOD IMPACTION-->SAV DIL 16 MM  . GERD (gastroesophageal reflux disease)   . Hypertension   . Kidney stone   . Myasthenia gravis (HCC)   . Sleep apnea   . Thyroid disease     Past Surgical History:  Procedure Laterality Date  . ATRIAL FIBRILLATION ABLATION N/A 08/12/2020   Procedure: ATRIAL FIBRILLATION ABLATION;  Surgeon: Lanier Prude, MD;  Location: [facility] INVASIVE CV LAB;  Service: Cardiovascular;  Laterality: N/A;  . BIOPSY  02/14/2018   Procedure: BIOPSY;  Surgeon: West Bali, MD;  Location: [facility] ENDO SUITE;  Service: Endoscopy;;  gastric  . CARDIOVERSION N/A 06/08/2020   Procedure: CARDIOVERSION;  Surgeon: Antoine Poche, MD;  Location: [facility] ENDO SUITE;  Service: Endoscopy;  Laterality: N/A;  . CARDIOVERSION N/A 09/16/2020   Procedure: CARDIOVERSION;  Surgeon: Meriam Sprague, MD;  Location: [facility] ENDOSCOPY;  Service: Cardiovascular;  Laterality: N/A;  . CHOLECYSTECTOMY    . COLONOSCOPY  2005   Dr. Lovell Sheehan  . COLONOSCOPY N/A 07/17/2014   Procedure: COLONOSCOPY;  Surgeon:  West Bali, MD;  Location: [facility] ENDO SUITE;  Service: Endoscopy;  Laterality: N/A;  9:30-rescheduled 8/28 same time Soledad Gerlach to notify pt  . ESOPHAGOGASTRODUODENOSCOPY N/A 07/25/2013   Shallow ulcer in the cardia, mid esophageal web, stricture at GE junction  . ESOPHAGOGASTRODUODENOSCOPY N/A 02/14/2018   Procedure: ESOPHAGOGASTRODUODENOSCOPY (EGD);  Surgeon: West Bali, MD;  Location: [facility]O [facility];  Service: Endoscopy;  Laterality: N/A;  11:15am  . ESOPHAGOGASTRODUODENOSCOPY (EGD) WITH ESOPHAGEAL DILATION N/A 08/29/2013   Candida esophagitis, small hiatal hernia, moderate nonerosive gastritis, mid esophageal web  . HERNIA REPAIR     right inguinal  . KIDNEY STONE SURGERY    . NASAL SEPTUM SURGERY    . PALATE SURGERY        . SAVORY DILATION N/A 02/14/2018   Procedure: SAVORY DILATION;  Surgeon: West Bali, MD;  Location: [facility];  Service: Endoscopy;  Laterality: N/A;  . TEE WITHOUT CARDIOVERSION N/A 08/10/2020   Procedure: TRANSESOPHAGEAL ECHOCARDIOGRAM (TEE);  Surgeon: Chilton Si, MD;  Location: [facility] ENDOSCOPY;  Service: Cardiovascular;  Laterality: N/A;  . THYMECTOMY    . TONSILLECTOMY    . UPPER GASTROINTESTINAL ENDOSCOPY  SEP 2011   SAV DIL    Family History  Problem Relation Age of Onset  . Liver disease Father        age 32, deceased  . Colon cancer Neg Hx     Social History  Tobacco Use  . Smoking status: Never Smoker  . Smokeless tobacco: Never Used  Vaping Use  . Vaping Use: Never used  Substance Use Topics  . Alcohol use: No  . Drug use: No     Home Medications Prior to Admission medications   Medication Sig Start Date End Date Taking? Authorizing Provider  acyclovir (ZOVIRAX) 400 MG tablet Take 800 mg by mouth daily.    Yes [provider]  amoxicillin-clavulanate (AUGMENTIN) 875-125 MG tablet Take 1 tablet by mouth 2 (two) times daily. 10/12/20  Yes [provider]  apixaban (ELIQUIS) 5 MG TABS tablet Take 1 tablet (5  mg total) by mouth 2 (two) times daily. 11/09/20  Yes Lanier Prude, MD  Ascorbic Acid (VITAMIN C) 1000 MG tablet Take 2,000 mg by mouth daily after lunch.    Yes [provider]  azaTHIOprine (IMURAN) 50 MG tablet Take 150 mg by mouth daily.    Yes [provider]  budesonide (PULMICORT) 0.5 MG/2ML nebulizer solution Take 0.5 mg by nebulization every 6 (six) hours as needed (SOB/wheezing). 12/14/20  Yes [provider]  Calcium Carbonate-Vitamin D (CALCIUM 600+D) 600-200 MG-UNIT TABS Take 2 tablets by mouth daily after lunch.    Yes [provider]  chlorpheniramine-HYDROcodone (TUSSIONEX) 10-8 MG/5ML SUER Take 5 mLs by mouth every 12 (twelve) hours as needed for cough. 12/20/20  Yes [provider]  diltiazem (CARDIZEM CD) 120 MG 24 hr capsule Take 1 capsule (120 mg total) by mouth daily. 11/09/20  Yes Lanier Prude, MD  doxycycline (VIBRA-TABS) 100 MG tablet Take 100 mg by mouth 2 (two) times daily. 12/20/20  Yes [provider]  losartan (COZAAR) 50 MG tablet Take 1 tablet (50 mg total) by mouth daily. 11/09/20  Yes Lanier Prude, MD  metFORMIN (GLUCOPHAGE-XR) 500 MG 24 hr tablet Take 1,000 mg by mouth 2 (two) times daily. 06/30/13  Yes [provider]  Omega-3 Fatty Acids (FISH OIL ULTRA) 1400 MG CAPS Take 1,400 mg by mouth daily after lunch.   Yes [provider]  omeprazole (PRILOSEC) 20 MG capsule TAKE (1) CAPSULE BY MOUTH TWICE DAILY. Patient taking differently: Take 20 mg by mouth in the morning and at bedtime. 03/03/20  Yes Tiffany Kocher, PA-C  Saw Palmetto 450 MG CAPS Take 2,250 mg by mouth daily after lunch.   Yes [provider]  SYNTHROID 137 MCG tablet Take 137 mcg by mouth daily before breakfast. 07/03/13  Yes [provider]  tadalafil (CIALIS) 20 MG tablet TAKE 1 TABLET BY MOUTH AS NEEDED Patient taking differently: Take 20 mg by mouth daily as needed for erectile dysfunction.  11/16/20  Yes McKenzie, Mardene Celeste, MD  tamsulosin (FLOMAX) 0.4 MG CAPS capsule TAKE 1 CAPSULE BY MOUTH TWICE DAILY Patient taking differently: Take 0.4 mg by mouth in the morning and at bedtime. 10/19/20  Yes McKenzie, Mardene Celeste, MD  vitamin E 180 MG (400 UNITS) capsule Take 800 Units by mouth daily after lunch.   Yes [provider]  [facility] VERIO test strip 1 each 3 (three) times daily. 08/05/20   [provider]  sulfamethoxazole-trimethoprim (BACTRIM DS) 800-160 MG tablet Take 1 tablet by mouth every 12 (twelve) hours. Patient not taking: No sig reported 10/19/20   Malen Gauze, MD     Allergies    Levofloxacin, Tequin [gatifloxacin], Aminoglycosides, Avelox [moxifloxacin hcl in nacl], Botox [onabotulinumtoxina], Botulinum toxins, Calcium channel blockers, Ciprofloxacin, Contrast media [iodinated diagnostic agents], Curare [tubocurarine], Dysport [  abobotulinumtoxina], Erythromycin, Factive [gemifloxacin], Floxin [ofloxacin], Gentamycin [gentamicin], Interferons, Kanamycin, Ketek [telithromycin], Levaquin [levofloxacin in d5w], Limonene, Macrolides and ketolides, Magnesium-containing compounds, Myobloc [rimabotulinumtoxinb], Neomycin, Noroxin [norfloxacin], Other, Penicillamine, Procainamide, Propranolol, Quinidine, Quinine derivatives, Streptomycin, Timolol, Tobramycin, Xeomin [incobotulinumtoxina], and Zithromax [azithromycin]   Review of Systems   Review of Systems A comprehensive review of systems was completed and negative except as noted in HPI.    Physical Exam BP (!) 110/59   Pulse 92   Temp 100.3 F (37.9 C) (Oral)   Resp (!) 22   Ht 6\' 1"  (1.854 m)   Wt 97.5 kg   SpO2 97%   BMI 28.37 kg/m   Physical Exam Vitals and nursing note reviewed.  Constitutional:      Appearance: Normal appearance.  HENT:     Head: Normocephalic and atraumatic.     Nose: Nose normal.     Mouth/Throat:     Mouth: Mucous membranes are moist.  Eyes:     Extraocular  Movements: Extraocular movements intact.     Conjunctiva/sclera: Conjunctivae normal.  Cardiovascular:     Rate and Rhythm: Tachycardia present.  Pulmonary:     Effort: Pulmonary effort is normal.     Breath sounds: Normal breath sounds.  Abdominal:     General: Abdomen is flat.     Palpations: Abdomen is soft.     Tenderness: There is no abdominal tenderness.  Musculoskeletal:        General: No swelling. Normal range of motion.     Cervical back: Neck supple.  Skin:    General: Skin is warm and dry.  Neurological:     General: No focal deficit present.     Mental Status: He is alert.  Psychiatric:        Mood and Affect: Mood normal.      ED Results / Procedures / Treatments   Labs (all labs ordered are listed, but only abnormal results are displayed) Labs Reviewed  LACTIC ACID, PLASMA - Abnormal; Notable for the following components:      Result Value   Lactic Acid, Venous 2.9 (*)    All other components within normal limits  CBC WITH DIFFERENTIAL/PLATELET - Abnormal; Notable for the following components:   Lymphs Abs 0.3 (*)    Abs Immature Granulocytes 0.16 (*)    All other components within normal limits  COMPREHENSIVE METABOLIC PANEL - Abnormal; Notable for the following components:   Sodium 133 (*)    Glucose, Bld 172 (*)    Creatinine, Ser 1.46 (*)    AST 56 (*)    Total Bilirubin 2.0 (*)    GFR, Estimated 52 (*)    All other components within normal limits  D-DIMER, QUANTITATIVE (NOT AT [facility]) - Abnormal; Notable for the following components:   D-Dimer, Quant 1.67 (*)    All other components within normal limits  LACTATE DEHYDROGENASE - Abnormal; Notable for the following components:   LDH 299 (*)    All other components within normal limits  FERRITIN - Abnormal; Notable for the following components:   Ferritin 543 (*)    All other components within normal limits  FIBRINOGEN - Abnormal; Notable for the following components:   Fibrinogen 674 (*)    All  other components within normal limits  C-REACTIVE PROTEIN - Abnormal; Notable for the following components:   CRP 19.3 (*)    All other components within normal limits  CBG MONITORING, ED - Abnormal; Notable for the following components:   Glucose-Capillary  163 (*)    All other components within normal limits  CULTURE, BLOOD (ROUTINE X 2)  CULTURE, BLOOD (ROUTINE X 2)  LACTIC ACID, PLASMA  PROCALCITONIN  TRIGLYCERIDES    EKG EKG Interpretation  Date/Time:  Tuesday December 21 2020 09:44:28 EST Ventricular Rate:  127 PR Interval:    QRS Duration: 86 QT Interval:  326 QTC Calculation: 474 R Axis:   82 Text Interpretation: Sinus tachycardia Paired ventricular premature complexes Aberrant complex Anteroseptal infarct, age indeterminate Since last tracing Rate faster Non-specific ST-t changes Confirmed by Susy FrizzleSheldon, Marilynne Dupuis [phone]([id number]) on 12/21/2020 10:00:50 AM   Radiology DG Chest Port 1 View  Result Date: 12/21/2020 CLINICAL DATA:  COVID pneumonia.  Hypoxia. EXAM: PORTABLE CHEST 1 VIEW COMPARISON:  12/27/2018 FINDINGS: Previous median sternotomy. Decreased lung volumes with asymmetric elevation of the right hemidiaphragm. Small to moderate left pleural effusion. Patchy peripheral opacities are identified within the right mid and right lower lung as well as the left mid and left lower lobe. IMPRESSION: 1. Bilateral peripheral airspace opacities compatible with multifocal pneumonia. 2. Small to moderate left pleural effusion. 3. Decreased lung volumes. Electronically Signed   By: Signa Kellaylor  Stroud M.D.   On: 12/21/2020 10:16    Procedures .Critical Care Performed by: Pollyann SavoySheldon, Roselina Burgueno B, MD Authorized by: Pollyann SavoySheldon, Issiac Jamar B, MD   Critical care provider statement:    Critical care time (minutes):  40   Critical care time was exclusive of:  Separately billable procedures and treating other patients   Critical care was necessary to treat or prevent imminent or life-threatening deterioration of  the following conditions:  Respiratory failure   Critical care was time spent personally by me on the following activities:  Discussions with consultants, evaluation of patient's response to treatment, examination of patient, ordering and performing treatments and interventions, ordering and review of laboratory studies, ordering and review of radiographic studies, pulse oximetry, re-evaluation of patient's condition, obtaining history from patient or surrogate and review of old charts    Medications Ordered in the ED Medications  methylPREDNISolone sodium succinate (SOLU-MEDROL) 125 mg/2 mL injection 60 mg (has no administration in time range)  insulin aspart (novoLOG) injection 0-9 Units (2 Units Subcutaneous Given 12/21/20 1157)  insulin aspart (novoLOG) injection 0-5 Units (has no administration in time range)  remdesivir 100 mg in sodium chloride 0.9 % 100 mL IVPB (0 mg Intravenous Stopped 12/21/20 1314)    Followed by  remdesivir 100 mg in sodium chloride 0.9 % 100 mL IVPB (has no administration in time range)  zinc sulfate capsule 220 mg (220 mg Oral Given 12/21/20 1200)  ascorbic acid (VITAMIN C) tablet 500 mg (500 mg Oral Given 12/21/20 1200)  acetaminophen (TYLENOL) tablet 650 mg (650 mg Oral Given 12/21/20 1032)     MDM Rules/Calculators/A&P MDM Patient with known covid, now hypoxic and febrile. Covid admission panel ordered. SpO2 improved with [place]. Anticipate admission. Patient states he does not tolerate steroids well and has had issues with afib triggered by prednisone in the past.  ED Course  I have reviewed the triage vital signs and the nursing notes.  Pertinent labs & imaging results that were available during my care of the patient were reviewed by me and considered in my medical decision making (see chart for details).  Clinical Course as of 12/21/20 1426  Tue Dec 21, 2020  1018 CBC with mild lymphopenia, consistent with Covid.  [CS]  1019 CXR consistent with Covid pneumonia.   [CS]  1019 CMP shows mild increase in  Cr above baseline. Will discuss admission with hospitalist.  [CS]  1105 Spoke with Dr. Jayme Cloud who will admit for further management.  [CS]    Clinical Course User Index [CS] Pollyann Savoy, MD    Final Clinical Impression(s) / ED Diagnoses Final diagnoses:  COVID-19  Acute respiratory failure with hypoxia [facility])    Rx / DC Orders ED Discharge Orders    None       Pollyann Savoy, MD 12/21/20 1426

## 2020-12-21 NOTE — H&P (Addendum)
Patient Demographics:    [name], is a 70 y.o. male  MRN: [id number]   DOB - September 26, 1951  Admit Date - 12/21/2020  Outpatient Primary MD for the patient is Elfredia NevinsFusco, Lawrence, MD   Assessment & Plan:    Principal Problem:   Acute respiratory disease due to COVID-19 virus Active Problems:   Pneumonia due to COVID-19 virus   Acute respiratory failure with hypoxia Due to Covid   Chronic anticoagulation   Myasthenia gravis (HCC)   Persistent atrial fibrillation (HCC)    1)Acute hypoxic respiratory failure secondary to COVID-19 infection/Pneumonia--- The treatment plan and use of medications  for treatment of COVID-19 infection and possible side effects were discussed with patient and wife  -Currently requiring 4 L of oxygen via nasal cannula -----Patient and wife verbalizes understanding and agrees to treatment protocols   --Patient is positive for COVID-19 infection, chest x-ray with findings of infiltrates/opacities,  patient is tachypneic/hypoxic and requiring continuous supplemental oxygen---patient meets criteria for initiation of Remdesivir AND Steroid therapy per protocol  --Check and trend inflammatory markers including D-dimer, ferritin and  CRP---also follow CBC and CMP --Supplemental oxygen to keep O2 sats above 93% -Follow serial chest x-rays and ABGs as indicated --- Encourage prone positioning for More than 16 hours/day in increments of 2 to 3 hours at a time if able to tolerate --Attempt to maintain euvolemic state --Zinc and vitamin C as ordered -Albuterol inhaler as needed -Accu-Cheks/fingersticks while on high-dose steroids -PPI while on high-dose steroids -After discussion with patient's primary neurologist Dr. Eudelia BunchVern Jewel -at  [phone]) [phone]--- [facility] neurology department, also further  discussion with Dr. Craige CottaSood from pulmonary critical care service--- decision was made to continue patient on Imuran for his myasthenia gravis due to the fact that Imuran's immunosuppressant effects are pretty long-lasting, stopping Imuran right now will not improve patient immunosuppressed status for at least another month -Also after discussion with Dr. Harland DingwallVern Jewel's Team including his fellow Dr. Dorothyann PengSasta) and Dr. Barry DienesSood--decision was made to treat patient with baricitinib to hopefully help improve survival rate for patient's Covid pneumonia infection -Baricitinib-does put patient at increased risk for secondary bacterial infection --Of note patient was treated for COVID-19 infection back in December 2019 -Patient is Not vaccinated due to immunocompromise state  COVID-19 Labs  Recent Labs    12/21/20 0951  DDIMER 1.67*  FERRITIN 543*  LDH 299*  CRP 19.3*    Lab Results  Component Value Date   SARSCOV2NAA Detected (A) 12/13/2020   SARSCOV2NAA NEGATIVE 09/14/2020   SARSCOV2NAA NEGATIVE 08/09/2020   SARSCOV2NAA NEGATIVE 06/07/2020    2)Myasthenia Gravis----status post prior to thymectomy, patient has done well for about 20 years now on his current dose of Imuran -[facility] Simeon CraftVern Jewel--continue Imuran at current dose -Please see discussion above #1 -Continue prophylactic acyclovir while on Imuran  3) chronic A. Fib--continue Eliquis for anticoagulation, continue Cardizem for rate control -Patient is at  risk for going into RVR while on steroids monitor closely  4) hypothyroidism--- stable, continue levothyroxine 137 mcg daily, check TSH  5) social/ethics--plan of care and advanced directive discussed with patient and his wife patient is a full code with full scope of treatment  6)DM2-hold Metformin, anticipate worsening hyperglycemia with steroids,  -Give Levemir 10 units twice daily Use Novolog/Humalog Sliding scale insulin with Accu-Cheks/Fingersticks as  ordered  7)HTN--hold losartan with creatinine trending up, continue Cardizem, may use IV labetalol as needed elevated BP  8)Aki --creatinine is up to 1.46 from a baseline usually around 1.0 to 1.1 -renally adjust medications, avoid nephrotoxic agents / dehydration  / hypotension -Hold losartan  Disposition/Need for in-Hospital Stay- patient unable to be discharged at this time due to --- Covid pneumonia with acute hypoxic respiratory failure requiring IV steroids, IV remdesivir and supplemental oxygen in an immunocompromised patient  Status is: Inpatient  Remains inpatient appropriate because:Please see above   Dispo: The patient is from: Home              Anticipated d/c is to: Home              Anticipated d/c date is: 3 days              Patient currently is not medically stable to d/c. Barriers: Not Clinically Stable-    With History of - Reviewed by me  Past Medical History:  Diagnosis Date  . Candida esophagitis (HCC) OCT 2014  . Deafness in right ear   . Diabetes mellitus without complication (HCC)   . Difficult intubation   . Esophageal stricture SEP 2011   FOOD IMPACTION-->SAV DIL 16 MM  . GERD (gastroesophageal reflux disease)   . Hypertension   . Kidney stone   . Myasthenia gravis (HCC)   . Sleep apnea   . Thyroid disease       Past Surgical History:  Procedure Laterality Date  . ATRIAL FIBRILLATION ABLATION N/A 08/12/2020   Procedure: ATRIAL FIBRILLATION ABLATION;  Surgeon: Lanier Prude, MD;  Location: [facility] INVASIVE CV LAB;  Service: Cardiovascular;  Laterality: N/A;  . BIOPSY  02/14/2018   Procedure: BIOPSY;  Surgeon: West Bali, MD;  Location: [facility] ENDO SUITE;  Service: Endoscopy;;  gastric  . CARDIOVERSION N/A 06/08/2020   Procedure: CARDIOVERSION;  Surgeon: Antoine Poche, MD;  Location: [facility] ENDO SUITE;  Service: Endoscopy;  Laterality: N/A;  . CARDIOVERSION N/A 09/16/2020   Procedure: CARDIOVERSION;  Surgeon: Meriam Sprague, MD;   Location: [facility] ENDOSCOPY;  Service: Cardiovascular;  Laterality: N/A;  . CHOLECYSTECTOMY    . COLONOSCOPY  2005   Dr. Lovell Sheehan  . COLONOSCOPY N/A 07/17/2014   Procedure: COLONOSCOPY;  Surgeon: West Bali, MD;  Location: [facility] ENDO SUITE;  Service: Endoscopy;  Laterality: N/A;  9:30-rescheduled 8/28 same time Soledad Gerlach to notify pt  . ESOPHAGOGASTRODUODENOSCOPY N/A 07/25/2013   Shallow ulcer in the cardia, mid esophageal web, stricture at GE junction  . ESOPHAGOGASTRODUODENOSCOPY N/A 02/14/2018   Procedure: ESOPHAGOGASTRODUODENOSCOPY (EGD);  Surgeon: West Bali, MD;  Location: [facility];  Service: Endoscopy;  Laterality: N/A;  11:15am  . ESOPHAGOGASTRODUODENOSCOPY (EGD) WITH ESOPHAGEAL DILATION N/A 08/29/2013   Candida esophagitis, small hiatal hernia, moderate nonerosive gastritis, mid esophageal web  . HERNIA REPAIR     right inguinal  . KIDNEY STONE SURGERY    . NASAL SEPTUM SURGERY    . PALATE SURGERY        . SAVORY DILATION N/A 02/14/2018  Procedure: SAVORY DILATION;  Surgeon: West Bali, MD;  Location: [facility];  Service: Endoscopy;  Laterality: N/A;  . TEE WITHOUT CARDIOVERSION N/A 08/10/2020   Procedure: TRANSESOPHAGEAL ECHOCARDIOGRAM (TEE);  Surgeon: Chilton Si, MD;  Location: [facility] ENDOSCOPY;  Service: Cardiovascular;  Laterality: N/A;  . THYMECTOMY    . TONSILLECTOMY    . UPPER GASTROINTESTINAL ENDOSCOPY  SEP 2011   SAV DIL    Chief Complaint  Patient presents with  . Shortness of Breath      HPI:    [name]  is a 70 y.o. male with past medical history relevant for hypothyroidism, myasthenia gravis chronically on Imuran for over 20 years now, status post thymectomy, history of dysphagia and esophagitis, chronic atrial fibrillation on Eliquis for anticoagulation , HTN and DM who presents to the ED with worsening cough dyspnea and found to be hypoxic with O2 sats in the 70s  -Requiring 4 L of oxygen per nasal cannula -- fever with temp above 103,  he had associated tachycardia and tachypnea  No Nausea, Vomiting or Diarrhea Additional hx obtained from pts wife----  After discussion with patient's primary neurologist Dr. Eudelia Bunch -at  [phone]--- [facility] neurology department, also further discussion with Dr. Craige Cotta from pulmonary critical care service--- decision was made to continue patient on Imuran for his myasthenia gravis due to the fact that Imuran's  immunosuppressant effects are  pretty long-lasting, stopping Imuran right now will not improve patient immunosuppressed status for at least another month -Also after discussion with Dr. Harland Dingwall Team including his fellow Dr. Dorothyann Peng) and Dr. Barry Dienes was made to treat patient with baricitinib to hopefully help improve survival rate for patient's Covid pneumonia infection --Of note patient was treated for COVID-19 infection back in December 2019 -Patient is Not vaccinated due to immunocompromise state  --Patient symptoms started on 12/12/2020 while he was flying out of [place] to come back to the [place], he had tested negative on 12/11/2020 while in [place] -- -He tested positive [place] on 12/13/2020 after returning from [place] -Creatinine is up to 1.46 baseline around 1.2   Review of systems:    In addition to the HPI above,   A full Review of  Systems was done, all other systems reviewed are negative except as noted above in HPI , .    Social History:  Reviewed by me    Social History   Tobacco Use  . Smoking status: Never Smoker  . Smokeless tobacco: Never Used  Substance Use Topics  . Alcohol use: No     Family History :  Reviewed by me    Family History  Problem Relation Age of Onset  . Liver disease Father        age 10, deceased  . Colon cancer Neg Hx      Home Medications:   Prior to Admission medications   Medication Sig Start Date End Date Taking? Authorizing Provider  acyclovir (ZOVIRAX) 400 MG tablet Take 800 mg by mouth daily.     Yes [provider]  amoxicillin-clavulanate (AUGMENTIN) 875-125 MG tablet Take 1 tablet by mouth 2 (two) times daily. 10/12/20  Yes [provider]  apixaban (ELIQUIS) 5 MG TABS tablet Take 1 tablet (5 mg total) by mouth 2 (two) times daily. 11/09/20  Yes Lanier Prude, MD  Ascorbic Acid (VITAMIN C) 1000 MG tablet Take 2,000 mg by mouth daily after lunch.    Yes [provider]  azaTHIOprine (IMURAN) 50  MG tablet Take 150 mg by mouth daily.    Yes [provider]  budesonide (PULMICORT) 0.5 MG/2ML nebulizer solution Take 0.5 mg by nebulization every 6 (six) hours as needed (SOB/wheezing). 12/14/20  Yes [provider]  Calcium Carbonate-Vitamin D (CALCIUM 600+D) 600-200 MG-UNIT TABS Take 2 tablets by mouth daily after lunch.    Yes [provider]  chlorpheniramine-HYDROcodone (TUSSIONEX) 10-8 MG/5ML SUER Take 5 mLs by mouth every 12 (twelve) hours as needed for cough. 12/20/20  Yes [provider]  diltiazem (CARDIZEM CD) 120 MG 24 hr capsule Take 1 capsule (120 mg total) by mouth daily. 11/09/20  Yes Lanier Prude, MD  doxycycline (VIBRA-TABS) 100 MG tablet Take 100 mg by mouth 2 (two) times daily. 12/20/20  Yes [provider]  losartan (COZAAR) 50 MG tablet Take 1 tablet (50 mg total) by mouth daily. 11/09/20  Yes Lanier Prude, MD  metFORMIN (GLUCOPHAGE-XR) 500 MG 24 hr tablet Take 1,000 mg by mouth 2 (two) times daily. 06/30/13  Yes [provider]  Omega-3 Fatty Acids (FISH OIL ULTRA) 1400 MG CAPS Take 1,400 mg by mouth daily after lunch.   Yes [provider]  omeprazole (PRILOSEC) 20 MG capsule TAKE (1) CAPSULE BY MOUTH TWICE DAILY. Patient taking differently: Take 20 mg by mouth in the morning and at bedtime. 03/03/20  Yes Tiffany Kocher, PA-C  Saw Palmetto 450 MG CAPS Take 2,250 mg by mouth daily after lunch.   Yes [provider]  SYNTHROID 137 MCG tablet Take 137 mcg by mouth  daily before breakfast. 07/03/13  Yes [provider]  tadalafil (CIALIS) 20 MG tablet TAKE 1 TABLET BY MOUTH AS NEEDED Patient taking differently: Take 20 mg by mouth daily as needed for erectile dysfunction. 11/16/20  Yes McKenzie, Mardene Celeste, MD  tamsulosin (FLOMAX) 0.4 MG CAPS capsule TAKE 1 CAPSULE BY MOUTH TWICE DAILY Patient taking differently: Take 0.4 mg by mouth in the morning and at bedtime. 10/19/20  Yes McKenzie, Mardene Celeste, MD  vitamin E 180 MG (400 UNITS) capsule Take 800 Units by mouth daily after lunch.   Yes [provider]  [facility] VERIO test strip 1 each 3 (three) times daily. 08/05/20   [provider]  sulfamethoxazole-trimethoprim (BACTRIM DS) 800-160 MG tablet Take 1 tablet by mouth every 12 (twelve) hours. Patient not taking: No sig reported 10/19/20   Malen Gauze, MD     Allergies:     Allergies  Allergen Reactions  . Levofloxacin Anaphylaxis  . Tequin [Gatifloxacin] Anaphylaxis  . Aminoglycosides Other (See Comments)    REACTION: Myasthenia Gravis (MG) Medication Alert  . Avelox [Moxifloxacin Hcl In Nacl] Other (See Comments)    REACTION: FLUOROQUINOLONE ANTIBIOTICS: Myasthenia Gravis (MG) Medication Alert  . Botox [Onabotulinumtoxina] Other (See Comments)    REACTION: Myasthenia Gravis (MG) Medication Alert  . Botulinum Toxins Other (See Comments)    REACTION: Myasthenia Gravis (MG) Medication Alert  . Calcium Channel Blockers Other (See Comments)    (BLOOD PRESSURE MEDICATION) REACTION: Myasthenia Gravis (MG) Medication Alert  . Ciprofloxacin Other (See Comments)    REACTION: FLUOROQUINOLONE ANTIBIOTICS: Myasthenia Gravis (MG) Medication Alert  . Contrast Media [Iodinated Diagnostic Agents] Other (See Comments)    MD told pt cannot have  . Curare [Tubocurarine] Other (See Comments)    (Usually only used during surgery)  REACTION: Myasthenia Gravis (MG) Medication Alert  . Dysport [Abobotulinumtoxina] Other (See Comments)     REACTION: Myasthenia Gravis (MG) Medication Alert  .  Erythromycin Other (See Comments)    REACTION: Myasthenia Gravis (MG) Medication Alert  . Factive [Gemifloxacin] Other (See Comments)    REACTION: FLUOROQUINOLONE ANTIBIOTICS: Myasthenia Gravis (MG) Medication Alert  . Floxin [Ofloxacin] Other (See Comments)    REACTION: FLUOROQUINOLONE ANTIBIOTICS: Myasthenia Gravis (MG) Medication Alert  . Gentamycin [Gentamicin] Other (See Comments)    REACTION:Myasthenia Gravis (MG) Medication Alert  . Interferons Other (See Comments)    REACTION: Myasthenia Gravis (MG) Medication Alert  . Kanamycin Other (See Comments)    REACTION: Myasthenia Gravis (MG) Medication Alert  . Ketek [Telithromycin] Other (See Comments)    REACTION: Myasthenia Gravis (MG) Medication Alert  . Levaquin [Levofloxacin In D5w] Other (See Comments)    REACTION: FLUOROQUINOLONE ANTIBIOTICS: Myasthenia Gravis (MG) Medication Alert  . Limonene Other (See Comments)    REACTION:Myasthenia Gravis (MG) Medication Alert  . Macrolides And Ketolides Other (See Comments)    REACTION: Myasthenia Gravis (MG) Medication Alert REACTION: Myasthenia Gravis (MG) Medication Alert  . Magnesium-Containing Compounds Other (See Comments)    Unknown  . Myobloc [Rimabotulinumtoxinb] Other (See Comments)    REACTION: Myasthenia Gravis (MG) Medication Alert  . Neomycin Other (See Comments)    REACTION: Myasthenia Gravis (MG) Medication Alert  . Noroxin [Norfloxacin] Other (See Comments)    REACTION: FLUOROQUINOLONE ANTIBIOTICS: Myasthenia Gravis (MG) Medication Alert  . Other Other (See Comments)    MAGNESIUM SALTS: Milk of Magnesia, some antacids (Maalox, Mylanta) REACTION: Myasthenia Gravis (MG) Medication Alerta  . Penicillamine Other (See Comments)    D-PENICILLAMINE *DO NOT CONFUSE WITH PENICILLIN*  REACTION: Myasthenia Gravis (MG) Medication Alert  . Procainamide Other (See Comments)    REACTION: Myasthenia Gravis (MG) Medication  Alert  . Propranolol Other (See Comments)    REACTION: Myasthenia Gravis (MG) Medication Alert  . Quinidine Other (See Comments)    REACTION: Myasthenia Gravis (MG) Medication Alert  . Quinine Derivatives Other (See Comments)    REACTION: Myasthenia Gravis (MG) Medication Alert  . Streptomycin Other (See Comments)    REACTION:  Myasthenia Gravis (MG) Medication Alert  . Timolol Other (See Comments)    REACTION: Myasthenia Gravis (MG) Medication Alert  . Tobramycin Other (See Comments)    REACTION: Myasthenia Gravis (MG) Medication Alert  . Xeomin [Incobotulinumtoxina] Other (See Comments)    REACTION: Myasthenia Gravis (MG) Medication Alert  . Zithromax [Azithromycin] Other (See Comments)    REACTION: Myasthenia Gravis (MG) Medication Alert     Physical Exam:   Vitals  Blood pressure (!) 103/51, pulse 74, temperature 100.3 F (37.9 C), temperature source Oral, resp. rate (!) 22, height 6\' 1"  (1.854 m), weight 97.5 kg, SpO2 98 %.  Physical Examination: General appearance - alert, ill appearing, and in no distress Nose- [place] 4L/min Mental status - alert, oriented to person, place, and time,  Eyes - sclera anicteric Neck - supple, no JVD elevation , Chest -diminished breath sounds with scattered rhonchi  heart - S1 and S2 normal, irregular , tachycardic, prior sternotomy scar Abdomen - soft, nontender, nondistended, no masses or organomegaly Neurological - screening mental status exam normal, neck supple without rigidity, cranial nerves II through XII intact, DTR's normal and symmetric Extremities - no pedal edema noted, intact peripheral pulses  Skin - warm, dry   Data Review:    CBC Recent Labs  Lab 12/21/20 0951  WBC 8.6  HGB 14.2  HCT 42.6  PLT 193  MCV 95.5  MCH 31.8  MCHC 33.3  RDW 15.5  LYMPHSABS 0.3*  MONOABS 1.0  EOSABS  0.0  BASOSABS 0.1    ------------------------------------------------------------------------------------------------------------------  Chemistries  Recent Labs  Lab 12/21/20 0951  NA 133*  K 4.0  CL 98  CO2 24  GLUCOSE 172*  BUN 20  CREATININE 1.46*  CALCIUM 9.0  AST 56*  ALT 37  ALKPHOS 81  BILITOT 2.0*   ------------------------------------------------------------------------------------------------------------------ estimated creatinine clearance is 58.7 mL/min (A) (by C-G formula based on SCr of 1.46 mg/dL (H)). ------------------------------------------------------------------------------------------------------------------ No results for input(s): TSH, T4TOTAL, T3FREE, THYROIDAB in the last 72 hours.  Invalid input(s): FREET3   Coagulation profile No results for input(s): INR, PROTIME in the last 168 hours. ------------------------------------------------------------------------------------------------------------------- Recent Labs    12/21/20 0951  DDIMER 1.67*   -------------------------------------------------------------------------------------------------------------------  Cardiac Enzymes No results for input(s): CKMB, TROPONINI, MYOGLOBIN in the last 168 hours.  Invalid input(s): CK ------------------------------------------------------------------------------------------------------------------ No results found for: BNP  Urinalysis    Component Value Date/Time   COLORURINE AMBER (A) 09/14/2018 1251   APPEARANCEUR Clear 10/18/2020 0920   LABSPEC 1.027 09/14/2018 1251   PHURINE 6.0 09/14/2018 1251   GLUCOSEU Trace (A) 10/18/2020 0920   HGBUR NEGATIVE 09/14/2018 1251   BILIRUBINUR Negative 10/18/2020 0920   KETONESUR 20 (A) 09/14/2018 1251   PROTEINUR Trace (A) 10/18/2020 0920   PROTEINUR 30 (A) 09/14/2018 1251   UROBILINOGEN 0.2 04/01/2014 2010   NITRITE Negative 10/18/2020 0920   NITRITE NEGATIVE 09/14/2018 1251   LEUKOCYTESUR Negative 10/18/2020 0920     ----------------------------------------------------------------------------------------------------------------   Imaging Results:    DG Chest Port 1 View  Result Date: 12/21/2020 CLINICAL DATA:  COVID pneumonia.  Hypoxia. EXAM: PORTABLE CHEST 1 VIEW COMPARISON:  12/27/2018 FINDINGS: Previous median sternotomy. Decreased lung volumes with asymmetric elevation of the right hemidiaphragm. Small to moderate left pleural effusion. Patchy peripheral opacities are identified within the right mid and right lower lung as well as the left mid and left lower lobe. IMPRESSION: 1. Bilateral peripheral airspace opacities compatible with multifocal pneumonia. 2. Small to moderate left pleural effusion. 3. Decreased lung volumes. Electronically Signed   By: Signa Kell M.D.   On: 12/21/2020 10:16    Radiological Exams on Admission: DG Chest Port 1 View  Result Date: 12/21/2020 CLINICAL DATA:  COVID pneumonia.  Hypoxia. EXAM: PORTABLE CHEST 1 VIEW COMPARISON:  12/27/2018 FINDINGS: Previous median sternotomy. Decreased lung volumes with asymmetric elevation of the right hemidiaphragm. Small to moderate left pleural effusion. Patchy peripheral opacities are identified within the right mid and right lower lung as well as the left mid and left lower lobe. IMPRESSION: 1. Bilateral peripheral airspace opacities compatible with multifocal pneumonia. 2. Small to moderate left pleural effusion. 3. Decreased lung volumes. Electronically Signed   By: Signa Kell M.D.   On: 12/21/2020 10:16    DVT Prophylaxis -SCD  /eliquis AM Labs Ordered, also please review Full Orders  Family Communication: Admission, patients condition and plan of care including tests being ordered have been discussed with the patient and wife who indicate understanding and agree with the plan   Code Status - Full Code  Likely DC to home after improvement in Covid pneumonia symptoms  Condition   stable*  Shon Hale M.D on 12/21/2020  at 5:16 PM Go to [email or web address] -  for contact info  [facility] - Office  [phone]

## 2020-12-21 NOTE — ED Notes (Signed)
Called to give report with no success.

## 2020-12-22 DIAGNOSIS — J1282 Pneumonia due to coronavirus disease 2019: Secondary | ICD-10-CM

## 2020-12-22 DIAGNOSIS — G7000 Myasthenia gravis without (acute) exacerbation: Secondary | ICD-10-CM

## 2020-12-22 DIAGNOSIS — Z7901 Long term (current) use of anticoagulants: Secondary | ICD-10-CM

## 2020-12-22 DIAGNOSIS — I4819 Other persistent atrial fibrillation: Secondary | ICD-10-CM

## 2020-12-22 DIAGNOSIS — J9601 Acute respiratory failure with hypoxia: Secondary | ICD-10-CM

## 2020-12-22 LAB — COMPREHENSIVE METABOLIC PANEL
ALT: 31 U/L (ref 0–44)
AST: 33 U/L (ref 15–41)
Albumin: 3.6 g/dL (ref 3.5–5.0)
Alkaline Phosphatase: 68 U/L (ref 38–126)
Anion gap: 9 (ref 5–15)
BUN: 35 mg/dL — ABNORMAL HIGH (ref 8–23)
CO2: 24 mmol/L (ref 22–32)
Calcium: 9 mg/dL (ref 8.9–10.3)
Chloride: 100 mmol/L (ref 98–111)
Creatinine, Ser: 1.29 mg/dL — ABNORMAL HIGH (ref 0.61–1.24)
GFR, Estimated: >60 mL/min (ref >60)
Glucose, Bld: 223 mg/dL — ABNORMAL HIGH (ref 70–99)
Potassium: 4.3 mmol/L (ref 3.5–5.1)
Sodium: 133 mmol/L — ABNORMAL LOW (ref 135–145)
Total Bilirubin: 1.1 mg/dL (ref 0.3–1.2)
Total Protein: 7.1 g/dL (ref 6.5–8.1)

## 2020-12-22 LAB — GLUCOSE, CAPILLARY
Glucose-Capillary: 221 mg/dL — ABNORMAL HIGH (ref 70–99)
Glucose-Capillary: 215 mg/dL — ABNORMAL HIGH (ref 70–99)
Glucose-Capillary: 158 mg/dL — ABNORMAL HIGH (ref 70–99)
Glucose-Capillary: 252 mg/dL — ABNORMAL HIGH (ref 70–99)

## 2020-12-22 LAB — CBC WITH DIFFERENTIAL/PLATELET
Abs Immature Granulocytes: 0.08 10*3/uL — ABNORMAL HIGH (ref 0.00–0.07)
Basophils Absolute: 0 10*3/uL (ref 0.0–0.1)
Basophils Relative: 0 %
Eosinophils Absolute: 0 10*3/uL (ref 0.0–0.5)
Eosinophils Relative: 0 %
HCT: 36.8 % — ABNORMAL LOW (ref 39.0–52.0)
Hemoglobin: 13 g/dL (ref 13.0–17.0)
Immature Granulocytes: 1 %
Lymphocytes Relative: 6 %
Lymphs Abs: 0.5 10*3/uL — ABNORMAL LOW (ref 0.7–4.0)
MCH: 33.4 pg (ref 26.0–34.0)
MCHC: 35.3 g/dL (ref 30.0–36.0)
MCV: 94.6 fL (ref 80.0–100.0)
Monocytes Absolute: 0.8 10*3/uL (ref 0.1–1.0)
Monocytes Relative: 9 %
Neutro Abs: 7 10*3/uL (ref 1.7–7.7)
Neutrophils Relative %: 84 %
Platelets: 188 10*3/uL (ref 150–400)
RBC: 3.89 MIL/uL — ABNORMAL LOW (ref 4.22–5.81)
RDW: 15.1 % (ref 11.5–15.5)
WBC: 8.4 10*3/uL (ref 4.0–10.5)
nRBC: 0 % (ref 0.0–0.2)

## 2020-12-22 LAB — TSH: TSH: 2.201 u[IU]/mL (ref 0.350–4.500)

## 2020-12-22 LAB — PHOSPHORUS: Phosphorus: 3.3 mg/dL (ref 2.5–4.6)

## 2020-12-22 LAB — FERRITIN: Ferritin: 512 ng/mL — ABNORMAL HIGH (ref 24–336)

## 2020-12-22 LAB — MAGNESIUM: Magnesium: 1.4 mg/dL — ABNORMAL LOW (ref 1.7–2.4)

## 2020-12-22 LAB — D-DIMER, QUANTITATIVE: D-Dimer, Quant: 0.87 ug/mL-FEU — ABNORMAL HIGH (ref 0.00–0.50)

## 2020-12-22 LAB — C-REACTIVE PROTEIN: CRP: 19.6 mg/dL — ABNORMAL HIGH (ref <1.0)

## 2020-12-22 MED ORDER — ALBUTEROL SULFATE HFA 108 (90 BASE) MCG/ACT IN AERS: 2.0000 | INHALATION_SPRAY | Freq: Four times a day (QID) | RESPIRATORY_TRACT | Status: DC | PRN

## 2020-12-22 MED ORDER — INSULIN DETEMIR 100 UNIT/ML ~~LOC~~ SOLN
14.0000 [IU] | Freq: Two times a day (BID) | SUBCUTANEOUS | Status: DC
  Administered 2020-12-22 – 2020-12-25 (×7): 14 [IU] via SUBCUTANEOUS
  Filled 2020-12-22 (×12): qty 0.14

## 2020-12-22 NOTE — Progress Notes (Signed)
Has ambulated often in room today and walks to bathroom, as well. O2 sat at rest was 93 on room air but required 4 liters to have O2 sat of 92 while ambulating.

## 2020-12-22 NOTE — Progress Notes (Signed)
SATURATION QUALIFICATIONS: (This note is used to comply with regulatory documentation for home oxygen)  Patient Saturations on Room Air at Rest = 94%  Patient Saturations on Room Air while Ambulating = 84%  Patient Saturations on 4 Liters of oxygen while Ambulating = 92%  Please briefly explain why patient needs home oxygen: Desats on room air

## 2020-12-22 NOTE — Progress Notes (Signed)
PROGRESS NOTE    [name]  FYB:[id number] DOB: [birth year] DOA: 12/21/2020 PCP: Elfredia Nevins, MD   Chief Complaint  Patient presents with  . Shortness of Breath    Brief admission narrative:  As per H&P written by Dr. Mariea Clonts on 12/21/2020 [name]  is a 70 y.o. male with past medical history relevant for hypothyroidism, myasthenia gravis chronically on Imuran for over 20 years now, status post thymectomy, history of dysphagia and esophagitis, chronic atrial fibrillation on Eliquis for anticoagulation , HTN and DM who presents to the ED with worsening cough dyspnea and found to be hypoxic with O2 sats in the 70s  -Requiring 4 L of oxygen per nasal cannula -- fever with temp above 103, he had associated tachycardia and tachypnea  No Nausea, Vomiting or Diarrhea Additional hx obtained from pts wife----  After discussion with patient's primary neurologist Dr. Eudelia Bunch -at  [phone]--- [facility] neurology department, also further discussion with Dr. Craige Cotta from pulmonary critical care service--- decision was made to continue patient on Imuran for his myasthenia gravis due to the fact that Imuran's  immunosuppressant effects are  pretty long-lasting, stopping Imuran right now will not improve patient immunosuppressed status for at least another month -Also after discussion with Dr. Harland Dingwall Team including his fellow Dr. Dorothyann Peng) and Dr. Barry Dienes was made to treat patient with baricitinib to hopefully help improve survival rate for patient's Covid pneumonia infection --Of note patient was treated for COVID-19 infection back in December 2019 -Patient is Not vaccinated due to immunocompromise state  --Patient symptoms started on 12/12/2020 while he was flying out of [place] to come back to the [place], he had tested negative on 12/11/2020 while in [place] -- -He tested positive [place] on 12/13/2020 after returning from [place] -Creatinine is up to 1.46 baseline around  1.2  Assessment & Plan: 1-acute respiratory failure with hypoxia due to COVID-19 pneumonia -Continue to follow inflammatory markers -Continue treatment with IV steroids, remdesivir and baricitinib -Patient instructed to use flutter valve, incentive spirometer and to be diligent with proning position and increase activity. -Continue as needed antitussive medication -Continue to wean off oxygen supplementation as tolerated.  2-Myasthenia gravis (HCC) -Continue treatment with Imuran -Need outpatient follow-up with neurology service.  3-Persistent atrial fibrillation (HCC) -continue cardizem -continue eliquis -Continue telemetry   4-hypothyroidism -continue synthroid   5-type 2 Diabetes -continue holding oral hypoglycemic regimen  -continue SSI  6-HTN -continue cardizem -follow VS and adjust antihypertensive regimen as required.  7-acute kidney injury -Continue adjusting medications renally -Avoid nephrotoxic agents and hypotension -Continue holding losartan -Follow renal function trend -Maintain adequate hydration.   DVT prophylaxis: Chronic Eliquis. Code Status: Full code. Family Communication: No family at bedside.  Patient expressed that he will contact them over the phone with updates. Disposition:   Status is: Inpatient  Dispo: The patient is from: Home              Anticipated d/c is to: Home              Anticipated d/c date is: 2-3 days.              Patient currently no medically stable for discharge; still requiring oxygen supplementation receiving IV steroids, remdesivir and baricitinib.   Difficult to place patient No       Consultants:   Discussion over the phone by Dr. Mariea Clonts with neurology service at [facility] (1 patient chronically is followed for Myasthenia gravis management).   Procedures:  See below for x-ray reports.   Antimicrobials:  None   Subjective: Afebrile, no chest pain, no nausea, no vomiting.  Reports slightly improvement in  his overall symptoms.  Still short winded sensation with activity, requiring 4 L nasal cannula supplementation and expressing dry coughing spells.  Objective: Vitals:   12/22/20 0832 12/22/20 1127 12/22/20 1300 12/22/20 1425  BP: 132/73   138/68  Pulse: 89   85  Resp: 16   17  Temp: 98.2 F (36.8 C)   98.2 F (36.8 C)  TempSrc: Oral     SpO2: 97% 95% 96% 98%  Weight:      Height:        Intake/Output Summary (Last 24 hours) at 12/22/2020 1451 Last data filed at 12/22/2020 0900 Gross per 24 hour  Intake 240 ml  Output 700 ml  Net -460 ml   Filed Weights   12/21/20 0940 12/21/20 1746  Weight: 97.5 kg 96.6 kg    Examination:  General exam: Appears calm and comfortable; no acute distress, no chest pain, no nausea, no vomiting.  Reports ongoing dry coughing spells unsure when the sensation.  Using 4 L nasal cannula supplementation. Respiratory system: No wheezing, positive scattered rhonchi; no using accessory muscle. Cardiovascular system: S1 & S2 heard, RRR. No JVD, murmurs, rubs, gallops or clicks. No pedal edema. Gastrointestinal system: Abdomen is nondistended, soft and nontender. No organomegaly or masses felt. Normal bowel sounds heard. Central nervous system: Alert and oriented. No focal neurological deficits. Extremities: No cyanosis or clubbing. Skin: No rashes, no petechiae. Psychiatry: Judgement and insight appear normal. Mood & affect appropriate.     Data Reviewed: I have personally reviewed following labs and imaging studies  CBC: Recent Labs  Lab 12/21/20 0951 12/22/20 0624  WBC 8.6 8.4  NEUTROABS 7.0 7.0  HGB 14.2 13.0  HCT 42.6 36.8*  MCV 95.5 94.6  PLT 193 188    Basic Metabolic Panel: Recent Labs  Lab 12/21/20 0951 12/22/20 0624  NA 133* 133*  K 4.0 4.3  CL 98 100  CO2 24 24  GLUCOSE 172* 223*  BUN 20 35*  CREATININE 1.46* 1.29*  CALCIUM 9.0 9.0  MG  --  1.4*  PHOS  --  3.3    GFR: Estimated Creatinine Clearance: 66.2 mL/min (A)  (by C-G formula based on SCr of 1.29 mg/dL (H)).  Liver Function Tests: Recent Labs  Lab 12/21/20 0951 12/22/20 0624  AST 56* 33  ALT 37 31  ALKPHOS 81 68  BILITOT 2.0* 1.1  PROT 7.7 7.1  ALBUMIN 4.0 3.6    CBG: Recent Labs  Lab 12/21/20 1121 12/21/20 1802 12/21/20 2127 12/22/20 0756 12/22/20 1137  GLUCAP 163* 222* 292* 221* 215*     Recent Results (from the past 240 hour(s))  Novel Coronavirus, NAA ([facility])     Status: Abnormal   Collection Time: 12/13/20 10:18 AM   Specimen: Nasopharyngeal Swab; Nasopharyngeal(NP) swabs in vial transport medium   Nasopharynge  Result Value Ref Range Status   SARS-CoV-2, NAA Detected (A) Not Detected Final    Comment: Patients who have a positive COVID-19 test result may now have treatment options. Treatment options are available for patients with mild to moderate symptoms and for hospitalized patients. Visit our website at [email or web address] for resources and information. This nucleic acid amplification test was developed and its performance characteristics determined by [employer]. Nucleic acid amplification tests include RT-PCR and TMA. This test has not been FDA cleared or approved. This test has  been authorized by FDA under an Emergency Use Authorization (EUA). This test is only authorized for the duration of time the declaration that circumstances exist justifying the authorization of the emergency use of in vitro diagnostic tests for detection of SARS-CoV-2 virus and/or diagnosis of COVID-19 infection under section 564(b)(1) of the Act, 21 U.S.C. 350KXF-8(H) (1), unless the authorization is terminated or revoked sooner. When diagnostic testing is negativ e, the possibility of a false negative result should be considered in the context of a patient's recent exposures and the presence of clinical signs and symptoms consistent with COVID-19. An individual without symptoms of COVID-19 and who is not  shedding SARS-CoV-2 virus would expect to have a negative (not detected) result in this assay.   SARS-COV-2, NAA 2 DAY TAT     Status: None   Collection Time: 12/13/20 10:18 AM   Nasopharynge  Result Value Ref Range Status   SARS-CoV-2, NAA 2 DAY TAT Performed  Final  Blood Culture (routine x 2)     Status: None (Preliminary result)   Collection Time: 12/21/20  9:51 AM   Specimen: BLOOD  Result Value Ref Range Status   Specimen Description BLOOD RIGHT ANTECUBITAL  Final   Special Requests   Final    BOTTLES DRAWN AEROBIC AND ANAEROBIC Blood Culture adequate volume   Culture   Final    NO GROWTH < 24 HOURS Performed at [facility], [address] [place], [place] [address]    Report Status PENDING  Incomplete  Blood Culture (routine x 2)     Status: None (Preliminary result)   Collection Time: 12/21/20  9:56 AM   Specimen: BLOOD  Result Value Ref Range Status   Specimen Description BLOOD LEFT ANTECUBITAL  Final   Special Requests   Final    BOTTLES DRAWN AEROBIC AND ANAEROBIC Blood Culture adequate volume   Culture   Final    NO GROWTH < 24 HOURS Performed at [facility], [address], [place], [place] [address]    Report Status PENDING  Incomplete     Radiology Studies: DG Chest Port 1 View  Result Date: 12/21/2020 CLINICAL DATA:  COVID pneumonia.  Hypoxia. EXAM: PORTABLE CHEST 1 VIEW COMPARISON:  12/27/2018 FINDINGS: Previous median sternotomy. Decreased lung volumes with asymmetric elevation of the right hemidiaphragm. Small to moderate left pleural effusion. Patchy peripheral opacities are identified within the right mid and right lower lung as well as the left mid and left lower lobe. IMPRESSION: 1. Bilateral peripheral airspace opacities compatible with multifocal pneumonia. 2. Small to moderate left pleural effusion. 3. Decreased lung volumes. Electronically Signed   By: Signa Kell M.D.   On: 12/21/2020 10:16    Scheduled Meds: . acyclovir  800 mg Oral Daily  .  albuterol  2 puff Inhalation Q6H  . apixaban  5 mg Oral BID  . vitamin C  2,000 mg Oral QPC lunch  . vitamin C  500 mg Oral Daily  . azaTHIOprine  150 mg Oral Daily  . baricitinib  4 mg Oral Daily  . calcium-vitamin D  2 tablet Oral QPC lunch  . diltiazem  120 mg Oral Daily  . folic acid  1 mg Oral Daily  . insulin aspart  0-5 Units Subcutaneous QHS  . insulin aspart  0-9 Units Subcutaneous TID WC  . insulin detemir  14 Units Subcutaneous BID  . levothyroxine  137 mcg Oral QAC breakfast  . methylPREDNISolone (SOLU-MEDROL) injection  60 mg Intravenous Q12H  . multivitamin  with minerals  1 tablet Oral Daily  . omega-3 acid ethyl esters  2 capsule Oral QPC lunch  . pantoprazole  40 mg Oral Daily  . sodium chloride flush  3 mL Intravenous Q12H  . sodium chloride flush  3 mL Intravenous Q12H  . tamsulosin  0.4 mg Oral QPC supper  . thiamine  100 mg Oral Daily  . vitamin E  800 Units Oral QPC lunch  . zinc sulfate  220 mg Oral Daily   Continuous Infusions: . sodium chloride    . remdesivir 100 mg in NS 100 mL 100 mg (12/22/20 1100)     LOS: 1 day    Time spent: 30 minutes.    Vassie Loll, MD [facility]   To contact the attending provider between 7A-7P or the covering provider during after hours 7P-7A, please log into the web site [email or web address] and access using universal [place] password for that web site. If you do not have the password, please call the hospital operator.  12/22/2020, 2:51 PM

## 2020-12-23 LAB — COMPREHENSIVE METABOLIC PANEL
ALT: 28 U/L (ref 0–44)
AST: 32 U/L (ref 15–41)
Albumin: 3.5 g/dL (ref 3.5–5.0)
Alkaline Phosphatase: 61 U/L (ref 38–126)
Anion gap: 8 (ref 5–15)
BUN: 31 mg/dL — ABNORMAL HIGH (ref 8–23)
CO2: 26 mmol/L (ref 22–32)
Calcium: 9.2 mg/dL (ref 8.9–10.3)
Chloride: 101 mmol/L (ref 98–111)
Creatinine, Ser: 1.09 mg/dL (ref 0.61–1.24)
GFR, Estimated: >60 mL/min (ref >60)
Glucose, Bld: 190 mg/dL — ABNORMAL HIGH (ref 70–99)
Potassium: 4.3 mmol/L (ref 3.5–5.1)
Sodium: 135 mmol/L (ref 135–145)
Total Bilirubin: 0.9 mg/dL (ref 0.3–1.2)
Total Protein: 6.8 g/dL (ref 6.5–8.1)

## 2020-12-23 LAB — D-DIMER, QUANTITATIVE: D-Dimer, Quant: 0.39 ug/mL-FEU (ref 0.00–0.50)

## 2020-12-23 LAB — CBC WITH DIFFERENTIAL/PLATELET
Abs Immature Granulocytes: 0.11 10*3/uL — ABNORMAL HIGH (ref 0.00–0.07)
Basophils Absolute: 0 10*3/uL (ref 0.0–0.1)
Basophils Relative: 0 %
Eosinophils Absolute: 0 10*3/uL (ref 0.0–0.5)
Eosinophils Relative: 0 %
HCT: 33 % — ABNORMAL LOW (ref 39.0–52.0)
Hemoglobin: 12.3 g/dL — ABNORMAL LOW (ref 13.0–17.0)
Immature Granulocytes: 1 %
Lymphocytes Relative: 3 %
Lymphs Abs: 0.3 10*3/uL — ABNORMAL LOW (ref 0.7–4.0)
MCH: 36.4 pg — ABNORMAL HIGH (ref 26.0–34.0)
MCHC: 37.3 g/dL — ABNORMAL HIGH (ref 30.0–36.0)
MCV: 97.6 fL (ref 80.0–100.0)
Monocytes Absolute: 0.5 10*3/uL (ref 0.1–1.0)
Monocytes Relative: 6 %
Neutro Abs: 8.6 10*3/uL — ABNORMAL HIGH (ref 1.7–7.7)
Neutrophils Relative %: 90 %
Platelets: 219 10*3/uL (ref 150–400)
RBC: 3.38 MIL/uL — ABNORMAL LOW (ref 4.22–5.81)
RDW: 15.5 % (ref 11.5–15.5)
WBC: 9.5 10*3/uL (ref 4.0–10.5)
nRBC: 0 % (ref 0.0–0.2)

## 2020-12-23 LAB — PHOSPHORUS: Phosphorus: 3.6 mg/dL (ref 2.5–4.6)

## 2020-12-23 LAB — GLUCOSE, CAPILLARY
Glucose-Capillary: 178 mg/dL — ABNORMAL HIGH (ref 70–99)
Glucose-Capillary: 306 mg/dL — ABNORMAL HIGH (ref 70–99)
Glucose-Capillary: 248 mg/dL — ABNORMAL HIGH (ref 70–99)
Glucose-Capillary: 220 mg/dL — ABNORMAL HIGH (ref 70–99)

## 2020-12-23 LAB — MAGNESIUM: Magnesium: 1.6 mg/dL — ABNORMAL LOW (ref 1.7–2.4)

## 2020-12-23 LAB — FERRITIN: Ferritin: 469 ng/mL — ABNORMAL HIGH (ref 24–336)

## 2020-12-23 LAB — C-REACTIVE PROTEIN: CRP: 11.5 mg/dL — ABNORMAL HIGH (ref <1.0)

## 2020-12-23 MED ORDER — INSULIN ASPART 100 UNIT/ML ~~LOC~~ SOLN
7.0000 [IU] | Freq: Three times a day (TID) | SUBCUTANEOUS | Status: DC
  Administered 2020-12-23 – 2020-12-25 (×5): 7 [IU] via SUBCUTANEOUS

## 2020-12-23 NOTE — Progress Notes (Signed)
Inpatient Diabetes Program Recommendations  AACE/ADA: New Consensus Statement on Inpatient Glycemic Control   Target Ranges:  Prepandial:   less than 140 mg/dL      Peak postprandial:   less than 180 mg/dL (1-2 hours)      Critically ill patients:  140 - 180 mg/dL   Results for [name], [name]" (MRN [id number]) as of 12/23/2020 09:26  Ref. Range 12/22/2020 07:56 12/22/2020 11:37 12/22/2020 16:44 12/22/2020 20:48 12/23/2020 07:50  Glucose-Capillary Latest Ref Range: 70 - 99 mg/dL 299 (H) 242 (H) 683 (H) 252 (H) 178 (H)   Review of Glycemic Control  Diabetes history: DM2 Outpatient Diabetes medications: Metformin XR 1000 mg BID Current orders for Inpatient glycemic control: Levemir 14 units BID, Novolog 0-9 units TID with meals, Novolog 0-5 units QHS; Solumedrol 60 mg Q12H  Inpatient Diabetes Program Recommendations:    Insulin: If steroids are continued, please consider ordering Novolog 3 units TID with meals for meal coverage if patient eats at least 50% of meals.   Thanks, Orlando Penner, RN, MSN, CDE Diabetes Coordinator Inpatient Diabetes Program [phone] (Team Pager from 8am to 5pm)

## 2020-12-23 NOTE — Progress Notes (Signed)
PROGRESS NOTE    [name]  HWT:[id number] DOB: 05/16/1951 DOA: 12/21/2020 PCP: Elfredia Nevins, MD   Chief Complaint  Patient presents with  . Shortness of Breath    Brief admission narrative:  As per H&P written by Dr. Mariea Clonts on 12/21/2020 [name]  is a 70 y.o. male with past medical history relevant for hypothyroidism, myasthenia gravis chronically on Imuran for over 20 years now, status post thymectomy, history of dysphagia and esophagitis, chronic atrial fibrillation on Eliquis for anticoagulation , HTN and DM who presents to the ED with worsening cough dyspnea and found to be hypoxic with O2 sats in the 70s  -Requiring 4 L of oxygen per nasal cannula -- fever with temp above 103, he had associated tachycardia and tachypnea  No Nausea, Vomiting or Diarrhea Additional hx obtained from pts wife----  After discussion with patient's primary neurologist Dr. Eudelia Bunch -at  [phone]--- [facility] neurology department, also further discussion with Dr. Craige Cotta from pulmonary critical care service--- decision was made to continue patient on Imuran for his myasthenia gravis due to the fact that Imuran's  immunosuppressant effects are  pretty long-lasting, stopping Imuran right now will not improve patient immunosuppressed status for at least another month -Also after discussion with Dr. Harland Dingwall Team including his fellow Dr. Dorothyann Peng) and Dr. Barry Dienes was made to treat patient with baricitinib to hopefully help improve survival rate for patient's Covid pneumonia infection --Of note patient was treated for COVID-19 infection back in December 2019 -Patient is Not vaccinated due to immunocompromise state  --Patient symptoms started on 12/12/2020 while he was flying out of [place] to come back to the [place], he had tested negative on 12/11/2020 while in [place] -- -He tested positive [place] on 12/13/2020 after returning from [place] -Creatinine is up to 1.46 baseline around  1.2  Assessment & Plan: 1-acute respiratory failure with hypoxia due to COVID-19 pneumonia -Continue to follow inflammatory markers -Continue treatment with IV steroids, remdesivir and baricitinib -Patient instructed to use flutter valve, incentive spirometer and to be diligent with proning position and increase activity. -Continue as needed antitussive/mucolytic's medication -Continue to wean off oxygen supplementation as tolerated.  2-Myasthenia gravis (HCC) -Continue treatment with Imuran -Need outpatient follow-up with neurology service after discharge.[name]  3-Persistent atrial fibrillation (HCC) -continue cardizem -continue eliquis -Continue telemetry for another 24 hours.  4-hypothyroidism -continue synthroid   5-type 2 Diabetes -continue holding oral hypoglycemic regimen  -continue SSI and Levemir -Adding 3 times daily Novolog for meal coverage. -Follow CBGs.  6-HTN -continue cardizem -follow VS and adjust antihypertensive regimen as required.  7-acute kidney injury -Continue adjusting medications renally -Avoid nephrotoxic agents and hypotension -Continue holding losartan -Follow renal function trend -Maintain adequate hydration.   DVT prophylaxis: Chronic Eliquis. Code Status: Full code. Family Communication: No family at bedside.  Patient expressed that he will contact them over the phone with updates. Disposition:   Status is: Inpatient  Dispo: The patient is from: Home              Anticipated d/c is to: Home              Anticipated d/c date is: 2 days.              Patient currently no medically stable for discharge; still requiring oxygen supplementation receiving IV steroids, remdesivir and baricitinib.   Difficult to place patient No       Consultants:   Discussion over the phone by Dr. Mariea Clonts with  neurology service at [facility] (1 patient chronically is followed for Myasthenia gravis management).   Procedures:  See below for x-ray  reports.   Antimicrobials:  None   Subjective: Patient reports spiking fever overnight; also experiencing diaphoresis.  No chest pain, no nausea, no vomiting.  Orbital they will start improvement in his breathing and is requiring 4 L of oxygen supplementation with ambulation.  Objective: Vitals:   12/22/20 1939 12/22/20 2005 12/23/20 0429 12/23/20 1430  BP: 138/64  126/69 125/72  Pulse: 87  74 70  Resp: 20  16 17   Temp: 99.1 F (37.3 C)  97.7 F (36.5 C) 98.5 F (36.9 C)  TempSrc:   Oral Oral  SpO2: 94% 94% 93% 94%  Weight:      Height:        Intake/Output Summary (Last 24 hours) at 12/23/2020 1704 Last data filed at 12/23/2020 1431 Gross per 24 hour  Intake 960 ml  Output -  Net 960 ml   Filed Weights   12/21/20 0940 12/21/20 1746  Weight: 97.5 kg 96.6 kg    Examination: General exam: Alert, awake, oriented x 3; no chest pain, no nausea, no vomiting.  Reports feeling better and breathing slightly easier.  Still short winded with activity but improving.  Experiencing hypoglycemia with ongoing treatment of his steroids for COVID.  Patient expressed back improvement overnight.  Still having intermittent productive coughing spells. Respiratory system: No wheezing, no using accessory muscles; positive rhonchi bilaterally. Cardiovascular system:RRR. No murmurs, rubs or gallops.  No JVD. Gastrointestinal system: Abdomen is nondistended, soft and nontender. No organomegaly or masses felt. Normal bowel sounds heard. Central nervous system: Alert and oriented. No focal neurological deficits. Extremities: No cyanosis or clubbing. Skin: No rashes, no petechiae. Psychiatry: Judgement and insight appear normal. Mood & affect appropriate.    Data Reviewed: I have personally reviewed following labs and imaging studies  CBC: Recent Labs  Lab 12/21/20 0951 12/22/20 0624 12/23/20 0635  WBC 8.6 8.4 9.5  NEUTROABS 7.0 7.0 8.6*  HGB 14.2 13.0 12.3*  HCT 42.6 36.8* 33.0*  MCV 95.5  94.6 97.6  PLT 193 188 219    Basic Metabolic Panel: Recent Labs  Lab 12/21/20 0951 12/22/20 0624 12/23/20 0635  NA 133* 133* 135  K 4.0 4.3 4.3  CL 98 100 101  CO2 24 24 26   GLUCOSE 172* 223* 190*  BUN 20 35* 31*  CREATININE 1.46* 1.29* 1.09  CALCIUM 9.0 9.0 9.2  MG  --  1.4* 1.6*  PHOS  --  3.3 3.6    GFR: Estimated Creatinine Clearance: 78.3 mL/min (by C-G formula based on SCr of 1.09 mg/dL).  Liver Function Tests: Recent Labs  Lab 12/21/20 0951 12/22/20 0624 12/23/20 0635  AST 56* 33 32  ALT 37 31 28  ALKPHOS 81 68 61  BILITOT 2.0* 1.1 0.9  PROT 7.7 7.1 6.8  ALBUMIN 4.0 3.6 3.5    CBG: Recent Labs  Lab 12/22/20 1644 12/22/20 2048 12/23/20 0750 12/23/20 1110 12/23/20 1610  GLUCAP 158* 252* 178* 306* 248*     Recent Results (from the past 240 hour(s))  Blood Culture (routine x 2)     Status: None (Preliminary result)   Collection Time: 12/21/20  9:51 AM   Specimen: BLOOD  Result Value Ref Range Status   Specimen Description BLOOD RIGHT ANTECUBITAL  Final   Special Requests   Final    BOTTLES DRAWN AEROBIC AND ANAEROBIC Blood Culture adequate volume   Culture  Final    NO GROWTH 2 DAYS Performed at [facility], [address] [place], [place] [address]    Report Status PENDING  Incomplete  Blood Culture (routine x 2)     Status: None (Preliminary result)   Collection Time: 12/21/20  9:56 AM   Specimen: BLOOD  Result Value Ref Range Status   Specimen Description BLOOD LEFT ANTECUBITAL  Final   Special Requests   Final    BOTTLES DRAWN AEROBIC AND ANAEROBIC Blood Culture adequate volume   Culture   Final    NO GROWTH 2 DAYS Performed at [facility], [address] [place], [place] [address]    Report Status PENDING  Incomplete     Radiology Studies: No results found.  Scheduled Meds: . acyclovir  800 mg Oral Daily  . apixaban  5 mg Oral BID  . vitamin C  2,000 mg Oral QPC lunch  . vitamin C  500 mg Oral Daily  . azaTHIOprine  150  mg Oral Daily  . baricitinib  4 mg Oral Daily  . calcium-vitamin D  2 tablet Oral QPC lunch  . diltiazem  120 mg Oral Daily  . folic acid  1 mg Oral Daily  . insulin aspart  0-5 Units Subcutaneous QHS  . insulin aspart  0-9 Units Subcutaneous TID WC  . [START ON 12/24/2020] insulin aspart  7 Units Subcutaneous TID WC  . insulin detemir  14 Units Subcutaneous BID  . levothyroxine  137 mcg Oral QAC breakfast  . methylPREDNISolone (SOLU-MEDROL) injection  60 mg Intravenous Q12H  . multivitamin with minerals  1 tablet Oral Daily  . omega-3 acid ethyl esters  2 capsule Oral QPC lunch  . pantoprazole  40 mg Oral Daily  . sodium chloride flush  3 mL Intravenous Q12H  . sodium chloride flush  3 mL Intravenous Q12H  . tamsulosin  0.4 mg Oral QPC supper  . thiamine  100 mg Oral Daily  . vitamin E  800 Units Oral QPC lunch  . zinc sulfate  220 mg Oral Daily   Continuous Infusions: . sodium chloride    . remdesivir 100 mg in NS 100 mL 100 mg (12/23/20 0907)     LOS: 2 days    Time spent: 30 minutes.    Vassie Loll, MD [facility]   To contact the attending provider between 7A-7P or the covering provider during after hours 7P-7A, please log into the web site [email or web address] and access using universal [place] password for that web site. If you do not have the password, please call the hospital operator.  12/23/2020, 5:04 PM

## 2020-12-24 LAB — COMPREHENSIVE METABOLIC PANEL
ALT: 27 U/L (ref 0–44)
AST: 27 U/L (ref 15–41)
Albumin: 3.4 g/dL — ABNORMAL LOW (ref 3.5–5.0)
Alkaline Phosphatase: 58 U/L (ref 38–126)
Anion gap: 10 (ref 5–15)
BUN: 30 mg/dL — ABNORMAL HIGH (ref 8–23)
CO2: 25 mmol/L (ref 22–32)
Calcium: 9.3 mg/dL (ref 8.9–10.3)
Chloride: 101 mmol/L (ref 98–111)
Creatinine, Ser: 1.08 mg/dL (ref 0.61–1.24)
GFR, Estimated: >60 mL/min (ref >60)
Glucose, Bld: 210 mg/dL — ABNORMAL HIGH (ref 70–99)
Potassium: 4.3 mmol/L (ref 3.5–5.1)
Sodium: 136 mmol/L (ref 135–145)
Total Bilirubin: 1.1 mg/dL (ref 0.3–1.2)
Total Protein: 6.5 g/dL (ref 6.5–8.1)

## 2020-12-24 LAB — GLUCOSE, CAPILLARY
Glucose-Capillary: 168 mg/dL — ABNORMAL HIGH (ref 70–99)
Glucose-Capillary: 351 mg/dL — ABNORMAL HIGH (ref 70–99)
Glucose-Capillary: 237 mg/dL — ABNORMAL HIGH (ref 70–99)
Glucose-Capillary: 271 mg/dL — ABNORMAL HIGH (ref 70–99)

## 2020-12-24 LAB — D-DIMER, QUANTITATIVE: D-Dimer, Quant: <0.27 ug/mL-FEU (ref 0.00–0.50)

## 2020-12-24 LAB — CBC WITH DIFFERENTIAL/PLATELET
Abs Immature Granulocytes: 0.18 10*3/uL — ABNORMAL HIGH (ref 0.00–0.07)
Basophils Absolute: 0 10*3/uL (ref 0.0–0.1)
Basophils Relative: 0 %
Eosinophils Absolute: 0 10*3/uL (ref 0.0–0.5)
Eosinophils Relative: 0 %
HCT: 36.8 % — ABNORMAL LOW (ref 39.0–52.0)
Hemoglobin: 12.7 g/dL — ABNORMAL LOW (ref 13.0–17.0)
Immature Granulocytes: 1 %
Lymphocytes Relative: 3 %
Lymphs Abs: 0.4 10*3/uL — ABNORMAL LOW (ref 0.7–4.0)
MCH: 32.1 pg (ref 26.0–34.0)
MCHC: 34.5 g/dL (ref 30.0–36.0)
MCV: 92.9 fL (ref 80.0–100.0)
Monocytes Absolute: 0.8 10*3/uL (ref 0.1–1.0)
Monocytes Relative: 6 %
Neutro Abs: 12 10*3/uL — ABNORMAL HIGH (ref 1.7–7.7)
Neutrophils Relative %: 90 %
Platelets: 234 10*3/uL (ref 150–400)
RBC: 3.96 MIL/uL — ABNORMAL LOW (ref 4.22–5.81)
RDW: 15.2 % (ref 11.5–15.5)
WBC: 13.4 10*3/uL — ABNORMAL HIGH (ref 4.0–10.5)
nRBC: 0 % (ref 0.0–0.2)

## 2020-12-24 LAB — PHOSPHORUS: Phosphorus: 3.3 mg/dL (ref 2.5–4.6)

## 2020-12-24 LAB — C-REACTIVE PROTEIN: CRP: 5.2 mg/dL — ABNORMAL HIGH (ref <1.0)

## 2020-12-24 LAB — MAGNESIUM: Magnesium: 1.6 mg/dL — ABNORMAL LOW (ref 1.7–2.4)

## 2020-12-24 LAB — FERRITIN: Ferritin: 473 ng/mL — ABNORMAL HIGH (ref 24–336)

## 2020-12-24 NOTE — Progress Notes (Signed)
PROGRESS NOTE    [name]  XIP:[id number] DOB: [birth year] DOA: 12/21/2020 PCP: Elfredia Nevins, MD   Chief Complaint  Patient presents with  . Shortness of Breath    Brief admission narrative:  As per H&P written by Dr. Mariea Clonts on 12/21/2020 [name]  is a 70 y.o. male with past medical history relevant for hypothyroidism, myasthenia gravis chronically on Imuran for over 20 years now, status post thymectomy, history of dysphagia and esophagitis, chronic atrial fibrillation on Eliquis for anticoagulation , HTN and DM who presents to the ED with worsening cough dyspnea and found to be hypoxic with O2 sats in the 70s  -Requiring 4 L of oxygen per nasal cannula -- fever with temp above 103, he had associated tachycardia and tachypnea  No Nausea, Vomiting or Diarrhea Additional hx obtained from pts wife----  After discussion with patient's primary neurologist Dr. Eudelia Bunch -at  [phone]--- [facility] neurology department, also further discussion with Dr. Craige Cotta from pulmonary critical care service--- decision was made to continue patient on Imuran for his myasthenia gravis due to the fact that Imuran's  immunosuppressant effects are  pretty long-lasting, stopping Imuran right now will not improve patient immunosuppressed status for at least another month -Also after discussion with Dr. Harland Dingwall Team including his fellow Dr. Dorothyann Peng) and Dr. Barry Dienes was made to treat patient with baricitinib to hopefully help improve survival rate for patient's Covid pneumonia infection --Of note patient was treated for COVID-19 infection back in December 2019 -Patient is Not vaccinated due to immunocompromise state  --Patient symptoms started on 12/12/2020 while he was flying out of [place] to come back to the [place], he had tested negative on 12/11/2020 while in [place] -- -He tested positive [place] on 12/13/2020 after returning from [place] -Creatinine is up to 1.46 baseline around  1.2  Assessment & Plan: 1-acute respiratory failure with hypoxia due to COVID-19 pneumonia -Continue to follow inflammatory markers -Continue treatment with IV steroids, remdesivir and baricitinib; patient will complete remdesivir infusion on 12/25/2020, anticipate discharge home at that time with or without oxygen supplementation based on further improvement. -Patient instructed to use flutter valve, incentive spirometer and to be diligent with proning position and increase activity. -Continue as needed antitussive/mucolytic's medication -Continue to wean off oxygen supplementation as tolerated.  2-Myasthenia gravis (HCC) -Continue treatment with Imuran -Need outpatient follow-up with neurology service after discharge.[name]  3-Persistent atrial fibrillation (HCC) -continue cardizem -continue eliquis -Continue telemetry for another 24 hours.  4-hypothyroidism -continue synthroid   5-type 2 Diabetes -continue holding oral hypoglycemic regimen  -continue SSI and Levemir -Adding 3 times daily Novolog for meal coverage. -Follow CBGs.  6-HTN -continue cardizem -follow VS and adjust antihypertensive regimen as required.  7-acute kidney injury -Continue adjusting medications renally -Avoid nephrotoxic agents and hypotension -Continue holding losartan -Follow renal function trend -Maintain adequate hydration.   DVT prophylaxis: Chronic Eliquis. Code Status: Full code. Family Communication: No family at bedside.  Patient expressed that he will contact them over the phone with updates. Disposition:   Status is: Inpatient  Dispo: The patient is from: Home              Anticipated d/c is to: Home              Anticipated d/c date is: 1 day.              Patient currently no medically stable for discharge; still requiring oxygen supplementation receiving IV steroids, remdesivir and baricitinib.  Difficult to place patient No       Consultants:   Discussion over the phone by  Dr. Mariea Clonts with neurology service at [facility] (1 patient chronically is followed for Myasthenia gravis management).   Procedures:  See below for x-ray reports.   Antimicrobials:  None   Subjective: Afebrile, no chest pain, no nausea, no vomiting.  Reports symptoms continue to improved.  Speaking in full sentences.  Slightly short winded with activity and requiring oxygen supplementation with ambulation.  Objective: Vitals:   12/23/20 1946 12/23/20 2030 12/24/20 0700 12/24/20 1500  BP: 132/77  118/68 106/80  Pulse: 71  74 74  Resp:   16 18  Temp: 98.2 F (36.8 C)  (!) 97.5 F (36.4 C) 97.9 F (36.6 C)  TempSrc: Oral  Oral Oral  SpO2: 90% 91% 90% 90%  Weight:      Height:        Intake/Output Summary (Last 24 hours) at 12/24/2020 1607 Last data filed at 12/23/2020 1747 Gross per 24 hour  Intake 240 ml  Output -  Net 240 ml   Filed Weights   12/21/20 0940 12/21/20 1746  Weight: 97.5 kg 96.6 kg    Examination: General exam: Alert, awake, oriented x 3; no chest pain, no nausea, no vomiting.  Reports continued improvement in his symptoms.  Having less coughing spells and breathing easier.  Still short winded with activity and requiring supplementation with ambulation. Respiratory system: No wheezing, no crackles, no using accessory muscles. Cardiovascular system:RRR. No murmurs, rubs, gallops. Gastrointestinal system: Abdomen is nondistended, soft and nontender. No organomegaly or masses felt. Normal bowel sounds heard. Central nervous system: Alert and oriented. No focal neurological deficits. Extremities: No C/C/E, +pedal pulses Skin: No rashes, lesions or ulcers Psychiatry: Judgement and insight appear normal. Mood & affect appropriate.   Data Reviewed: I have personally reviewed following labs and imaging studies  CBC: Recent Labs  Lab 12/21/20 0951 12/22/20 0624 12/23/20 0635 12/24/20 0428  WBC 8.6 8.4 9.5 13.4*  NEUTROABS 7.0 7.0 8.6* 12.0*  HGB 14.2 13.0 12.3*  12.7*  HCT 42.6 36.8* 33.0* 36.8*  MCV 95.5 94.6 97.6 92.9  PLT 193 188 219 234    Basic Metabolic Panel: Recent Labs  Lab 12/21/20 0951 12/22/20 0624 12/23/20 0635 12/24/20 0428  NA 133* 133* 135 136  K 4.0 4.3 4.3 4.3  CL 98 100 101 101  CO2 24 24 26 25   GLUCOSE 172* 223* 190* 210*  BUN 20 35* 31* 30*  CREATININE 1.46* 1.29* 1.09 1.08  CALCIUM 9.0 9.0 9.2 9.3  MG  --  1.4* 1.6* 1.6*  PHOS  --  3.3 3.6 3.3    GFR: Estimated Creatinine Clearance: 79.1 mL/min (by C-G formula based on SCr of 1.08 mg/dL).  Liver Function Tests: Recent Labs  Lab 12/21/20 0951 12/22/20 0624 12/23/20 0635 12/24/20 0428  AST 56* 33 32 27  ALT 37 31 28 27   ALKPHOS 81 68 61 58  BILITOT 2.0* 1.1 0.9 1.1  PROT 7.7 7.1 6.8 6.5  ALBUMIN 4.0 3.6 3.5 3.4*    CBG: Recent Labs  Lab 12/23/20 1610 12/23/20 2130 12/24/20 0749 12/24/20 1117 12/24/20 1604  GLUCAP 248* 220* 168* 351* 237*     Recent Results (from the past 240 hour(s))  Blood Culture (routine x 2)     Status: None (Preliminary result)   Collection Time: 12/21/20  9:51 AM   Specimen: BLOOD  Result Value Ref Range Status   Specimen Description BLOOD  RIGHT ANTECUBITAL  Final   Special Requests   Final    BOTTLES DRAWN AEROBIC AND ANAEROBIC Blood Culture adequate volume   Culture   Final    NO GROWTH 3 DAYS Performed at [facility], [address] [place], [place] [address]    Report Status PENDING  Incomplete  Blood Culture (routine x 2)     Status: None (Preliminary result)   Collection Time: 12/21/20  9:56 AM   Specimen: BLOOD  Result Value Ref Range Status   Specimen Description BLOOD LEFT ANTECUBITAL  Final   Special Requests   Final    BOTTLES DRAWN AEROBIC AND ANAEROBIC Blood Culture adequate volume   Culture   Final    NO GROWTH 3 DAYS Performed at [facility], [address], [place], [place] [address]    Report Status PENDING  Incomplete     Radiology Studies: No results found.  Scheduled Meds: .  acyclovir  800 mg Oral Daily  . apixaban  5 mg Oral BID  . vitamin C  2,000 mg Oral QPC lunch  . vitamin C  500 mg Oral Daily  . azaTHIOprine  150 mg Oral Daily  . baricitinib  4 mg Oral Daily  . calcium-vitamin D  2 tablet Oral QPC lunch  . diltiazem  120 mg Oral Daily  . folic acid  1 mg Oral Daily  . insulin aspart  0-5 Units Subcutaneous QHS  . insulin aspart  0-9 Units Subcutaneous TID WC  . insulin aspart  7 Units Subcutaneous TID WC  . insulin detemir  14 Units Subcutaneous BID  . levothyroxine  137 mcg Oral QAC breakfast  . methylPREDNISolone (SOLU-MEDROL) injection  60 mg Intravenous Q12H  . multivitamin with minerals  1 tablet Oral Daily  . omega-3 acid ethyl esters  2 capsule Oral QPC lunch  . pantoprazole  40 mg Oral Daily  . sodium chloride flush  3 mL Intravenous Q12H  . sodium chloride flush  3 mL Intravenous Q12H  . tamsulosin  0.4 mg Oral QPC supper  . thiamine  100 mg Oral Daily  . vitamin E  800 Units Oral QPC lunch  . zinc sulfate  220 mg Oral Daily   Continuous Infusions: . sodium chloride    . remdesivir 100 mg in NS 100 mL 100 mg (12/24/20 1102)     LOS: 3 days    Time spent: 30 minutes.    Vassie Loll, MD [facility]   To contact the attending provider between 7A-7P or the covering provider during after hours 7P-7A, please log into the web site [email or web address] and access using universal [place] password for that web site. If you do not have the password, please call the hospital operator.  12/24/2020, 4:07 PM

## 2020-12-24 NOTE — Progress Notes (Signed)
SATURATION QUALIFICATIONS: (This note is used to comply with regulatory documentation for home oxygen)  Patient Saturations on Room Air at Rest = 96%  Patient Saturations on Room Air while Ambulating = 89%  Patient Saturations on .5 Liters of oxygen while Ambulating = 90%  Please briefly explain why patient needs home oxygen:

## 2020-12-25 DIAGNOSIS — E1165 Type 2 diabetes mellitus with hyperglycemia: Secondary | ICD-10-CM

## 2020-12-25 LAB — PHOSPHORUS: Phosphorus: 3.3 mg/dL (ref 2.5–4.6)

## 2020-12-25 LAB — COMPREHENSIVE METABOLIC PANEL WITH GFR
ALT: 46 U/L — ABNORMAL HIGH (ref 0–44)
AST: 46 U/L — ABNORMAL HIGH (ref 15–41)
Albumin: 3.5 g/dL (ref 3.5–5.0)
Alkaline Phosphatase: 56 U/L (ref 38–126)
Anion gap: 11 (ref 5–15)
BUN: 30 mg/dL — ABNORMAL HIGH (ref 8–23)
CO2: 24 mmol/L (ref 22–32)
Calcium: 8.9 mg/dL (ref 8.9–10.3)
Chloride: 101 mmol/L (ref 98–111)
Creatinine, Ser: 1.04 mg/dL (ref 0.61–1.24)
GFR, Estimated: >60 mL/min
Glucose, Bld: 156 mg/dL — ABNORMAL HIGH (ref 70–99)
Potassium: 4.1 mmol/L (ref 3.5–5.1)
Sodium: 136 mmol/L (ref 135–145)
Total Bilirubin: 1 mg/dL (ref 0.3–1.2)
Total Protein: 6.6 g/dL (ref 6.5–8.1)

## 2020-12-25 LAB — CBC WITH DIFFERENTIAL/PLATELET
Abs Immature Granulocytes: 0.73 K/uL — ABNORMAL HIGH (ref 0.00–0.07)
Basophils Absolute: 0.1 K/uL (ref 0.0–0.1)
Basophils Relative: 1 %
Eosinophils Absolute: 0 K/uL (ref 0.0–0.5)
Eosinophils Relative: 0 %
HCT: 35.4 % — ABNORMAL LOW (ref 39.0–52.0)
Hemoglobin: 13.3 g/dL (ref 13.0–17.0)
Immature Granulocytes: 4 %
Lymphocytes Relative: 3 %
Lymphs Abs: 0.5 K/uL — ABNORMAL LOW (ref 0.7–4.0)
MCH: 36.2 pg — ABNORMAL HIGH (ref 26.0–34.0)
MCHC: 37.6 g/dL — ABNORMAL HIGH (ref 30.0–36.0)
MCV: 96.5 fL (ref 80.0–100.0)
Monocytes Absolute: 0.9 K/uL (ref 0.1–1.0)
Monocytes Relative: 5 %
Neutro Abs: 14.4 K/uL — ABNORMAL HIGH (ref 1.7–7.7)
Neutrophils Relative %: 87 %
Platelets: 274 K/uL (ref 150–400)
RBC: 3.67 MIL/uL — ABNORMAL LOW (ref 4.22–5.81)
RDW: 15.7 % — ABNORMAL HIGH (ref 11.5–15.5)
WBC: 16.7 K/uL — ABNORMAL HIGH (ref 4.0–10.5)
nRBC: 0 % (ref 0.0–0.2)

## 2020-12-25 LAB — GLUCOSE, CAPILLARY
Glucose-Capillary: 151 mg/dL — ABNORMAL HIGH (ref 70–99)
Glucose-Capillary: 220 mg/dL — ABNORMAL HIGH (ref 70–99)

## 2020-12-25 LAB — FERRITIN: Ferritin: 386 ng/mL — ABNORMAL HIGH (ref 24–336)

## 2020-12-25 LAB — C-REACTIVE PROTEIN: CRP: 2 mg/dL — ABNORMAL HIGH

## 2020-12-25 LAB — MAGNESIUM: Magnesium: 1.8 mg/dL (ref 1.7–2.4)

## 2020-12-25 LAB — D-DIMER, QUANTITATIVE: D-Dimer, Quant: <0.27 ug/mL-FEU (ref 0.00–0.50)

## 2020-12-25 MED ORDER — ZINC SULFATE 220 (50 ZN) MG PO CAPS: 220.0000 mg | ORAL_CAPSULE | Freq: Every day | ORAL | 1 refills | Status: DC

## 2020-12-25 MED ORDER — PREDNISONE 20 MG PO TABS
ORAL_TABLET | ORAL | 0 refills | Status: DC
Start: 2020-12-25 — End: 2021-01-27

## 2020-12-25 MED ORDER — HYDROCOD POLST-CPM POLST ER 10-8 MG/5ML PO SUER: 5.0000 mL | Freq: Two times a day (BID) | ORAL | 0 refills | Status: DC | PRN

## 2020-12-25 NOTE — Progress Notes (Signed)
Nsg Discharge Note  Admit Date:  12/21/2020 Discharge date: 12/25/2020   [name] to be D/C'd Home per MD order.  AVS completed.  Copy for chart, and copy for patient signed, and dated. Patient/caregiver able to verbalize understanding. IV removed. Home oxygen with patient upon discharge. Patient stable upon discharge.  Discharge Medication: Allergies as of 12/25/2020      Reactions   Levofloxacin Anaphylaxis   Tequin [gatifloxacin] Anaphylaxis   Aminoglycosides Other (See Comments)   REACTION: Myasthenia Gravis (MG) Medication Alert   Avelox [moxifloxacin Hcl In Nacl] Other (See Comments)   REACTION: FLUOROQUINOLONE ANTIBIOTICS: Myasthenia Gravis (MG) Medication Alert   Botox [onabotulinumtoxina] Other (See Comments)   REACTION: Myasthenia Gravis (MG) Medication Alert   Botulinum Toxins Other (See Comments)   REACTION: Myasthenia Gravis (MG) Medication Alert   Calcium Channel Blockers Other (See Comments)   (BLOOD PRESSURE MEDICATION) REACTION: Myasthenia Gravis (MG) Medication Alert   Ciprofloxacin Other (See Comments)   REACTION: FLUOROQUINOLONE ANTIBIOTICS: Myasthenia Gravis (MG) Medication Alert   Contrast Media [iodinated Diagnostic Agents] Other (See Comments)   MD told pt cannot have   Curare [tubocurarine] Other (See Comments)   (Usually only used during surgery)  REACTION: Myasthenia Gravis (MG) Medication Alert   Dysport [abobotulinumtoxina] Other (See Comments)   REACTION: Myasthenia Gravis (MG) Medication Alert   Erythromycin Other (See Comments)   REACTION: Myasthenia Gravis (MG) Medication Alert   Factive [gemifloxacin] Other (See Comments)   REACTION: FLUOROQUINOLONE ANTIBIOTICS: Myasthenia Gravis (MG) Medication Alert   Floxin [ofloxacin] Other (See Comments)   REACTION: FLUOROQUINOLONE ANTIBIOTICS: Myasthenia Gravis (MG) Medication Alert   Gentamycin [gentamicin] Other (See Comments)   REACTION:Myasthenia Gravis (MG) Medication Alert   Interferons Other (See  Comments)   REACTION: Myasthenia Gravis (MG) Medication Alert   Kanamycin Other (See Comments)   REACTION: Myasthenia Gravis (MG) Medication Alert   Ketek [telithromycin] Other (See Comments)   REACTION: Myasthenia Gravis (MG) Medication Alert   Levaquin [levofloxacin In D5w] Other (See Comments)   REACTION: FLUOROQUINOLONE ANTIBIOTICS: Myasthenia Gravis (MG) Medication Alert   Limonene Other (See Comments)   REACTION:Myasthenia Gravis (MG) Medication Alert   Macrolides And Ketolides Other (See Comments)   REACTION: Myasthenia Gravis (MG) Medication Alert REACTION: Myasthenia Gravis (MG) Medication Alert   Magnesium-containing Compounds Other (See Comments)   Unknown   Myobloc [rimabotulinumtoxinb] Other (See Comments)   REACTION: Myasthenia Gravis (MG) Medication Alert   Neomycin Other (See Comments)   REACTION: Myasthenia Gravis (MG) Medication Alert   Noroxin [norfloxacin] Other (See Comments)   REACTION: FLUOROQUINOLONE ANTIBIOTICS: Myasthenia Gravis (MG) Medication Alert   Other Other (See Comments)   MAGNESIUM SALTS: Milk of Magnesia, some antacids (Maalox, Mylanta) REACTION: Myasthenia Gravis (MG) Medication Alerta   Penicillamine Other (See Comments)   D-PENICILLAMINE *DO NOT CONFUSE WITH PENICILLIN*  REACTION: Myasthenia Gravis (MG) Medication Alert   Procainamide Other (See Comments)   REACTION: Myasthenia Gravis (MG) Medication Alert   Propranolol Other (See Comments)   REACTION: Myasthenia Gravis (MG) Medication Alert   Quinidine Other (See Comments)   REACTION: Myasthenia Gravis (MG) Medication Alert   Quinine Derivatives Other (See Comments)   REACTION: Myasthenia Gravis (MG) Medication Alert   Streptomycin Other (See Comments)   REACTION:  Myasthenia Gravis (MG) Medication Alert   Timolol Other (See Comments)   REACTION: Myasthenia Gravis (MG) Medication Alert   Tobramycin Other (See Comments)   REACTION: Myasthenia Gravis (MG) Medication Alert   Xeomin  [incobotulinumtoxina] Other (See Comments)   REACTION: Myasthenia Gravis (  MG) Medication Alert   Zithromax [azithromycin] Other (See Comments)   REACTION: Myasthenia Gravis (MG) Medication Alert      Medication List    STOP taking these medications   amoxicillin-clavulanate 875-125 MG tablet Commonly known as: AUGMENTIN   doxycycline 100 MG tablet Commonly known as: VIBRA-TABS   sulfamethoxazole-trimethoprim 800-160 MG tablet Commonly known as: BACTRIM DS     TAKE these medications   acyclovir 400 MG tablet Commonly known as: ZOVIRAX Take 800 mg by mouth daily.   apixaban 5 MG Tabs tablet Commonly known as: Eliquis Take 1 tablet (5 mg total) by mouth 2 (two) times daily.   azaTHIOprine 50 MG tablet Commonly known as: IMURAN Take 150 mg by mouth daily.   budesonide 0.5 MG/2ML nebulizer solution Commonly known as: PULMICORT Take 0.5 mg by nebulization every 6 (six) hours as needed (SOB/wheezing).   Calcium 600+D 600-200 MG-UNIT Tabs Generic drug: Calcium Carbonate-Vitamin D Take 2 tablets by mouth daily after lunch.   chlorpheniramine-HYDROcodone 10-8 MG/5ML Suer Commonly known as: TUSSIONEX Take 5 mLs by mouth every 12 (twelve) hours as needed for cough.   diltiazem 120 MG 24 hr capsule Commonly known as: CARDIZEM CD Take 1 capsule (120 mg total) by mouth daily.   Fish Oil Ultra 1400 MG Caps Take 1,400 mg by mouth daily after lunch.   losartan 50 MG tablet Commonly known as: COZAAR Take 1 tablet (50 mg total) by mouth daily.   metFORMIN 500 MG 24 hr tablet Commonly known as: GLUCOPHAGE-XR Take 1,000 mg by mouth 2 (two) times daily.   omeprazole 20 MG capsule Commonly known as: PRILOSEC TAKE (1) CAPSULE BY MOUTH TWICE DAILY. What changed: See the new instructions.   OneTouch Verio test strip Generic drug: glucose blood 1 each 3 (three) times daily.   predniSONE 20 MG tablet Commonly known as: DELTASONE Take 3 tablets by mouth daily x2 days; then 2  tablets by mouth daily x2 days; then 1 tablet by mouth daily x3 days; then half tablet by mouth daily x3 days and stop prednisone.   Saw Palmetto 450 MG Caps Take 2,250 mg by mouth daily after lunch.   Synthroid 137 MCG tablet Generic drug: levothyroxine Take 137 mcg by mouth daily before breakfast.   tadalafil 20 MG tablet Commonly known as: CIALIS TAKE 1 TABLET BY MOUTH AS NEEDED What changed:   when to take this  reasons to take this   tamsulosin 0.4 MG Caps capsule Commonly known as: FLOMAX TAKE 1 CAPSULE BY MOUTH TWICE DAILY What changed: when to take this   vitamin C 1000 MG tablet Take 2,000 mg by mouth daily after lunch.   vitamin E 180 MG (400 UNITS) capsule Take 800 Units by mouth daily after lunch.   zinc sulfate 220 (50 Zn) MG capsule Take 1 capsule (220 mg total) by mouth daily. Start taking on: December 26, 2020            Durable Medical Equipment  (From admission, onward)         Start     Ordered   12/25/20 1100  For home use only DME oxygen  Once       Question Answer Comment  Length of Need 6 Months   Mode or (Route) Nasal cannula   Liters per Minute 2   Frequency Continuous (stationary and portable oxygen unit needed)   Oxygen conserving device Yes   Oxygen delivery system Gas      12/25/20 1100  Discharge Assessment: Vitals:   12/25/20 0500 12/25/20 0915  BP: (!) 143/74 137/76  Pulse: (!) 56 81  Resp: 18 19  Temp: 97.6 F (36.4 C)   SpO2: 96% 94%   Skin clean, dry and intact without evidence of skin break down, no evidence of skin tears noted. IV catheter discontinued intact. Site without signs and symptoms of complications - no redness or edema noted at insertion site, patient denies c/o pain - only slight tenderness at site.  Dressing with slight pressure applied.  D/c Instructions-Education: Discharge instructions given to patient/family with verbalized understanding. D/c education completed with patient/family  including follow up instructions, medication list, d/c activities limitations if indicated, with other d/c instructions as indicated by MD - patient able to verbalize understanding, all questions fully answered. Patient instructed to return to ED, call 911, or call MD for any changes in condition.  Patient escorted via WC, and D/C home via private auto.  Lonn Georgia, RN 12/25/2020 5:01 PM

## 2020-12-25 NOTE — Progress Notes (Signed)
.  SATURATION QUALIFICATIONS: (This note is used to comply with regulatory documentation for home oxygen)  Patient Saturations on Room Air at Rest = 90%  Patient Saturations on Room Air while Ambulating = 87%  Patient Saturations on 2 Liters of oxygen while Ambulating = 91%  Please briefly explain why patient needs home oxygen:  

## 2020-12-25 NOTE — TOC Transition Note (Signed)
Transition of Care [facility]) - CM/SW Discharge Note   Patient Details  Name: [name] MRN: [id number] Date of Birth: [birth year]  [facility] CM/SW Contact:  Barry Brunner, LCSW Phone Number: 12/25/2020, 2:52 PM   Clinical Narrative:    CSW notified of patient's readiness for discharge and O2 needs. CSW placed a referral with [employer] for O2. [employer] agreeable to provide O2 to patient. TOC signing off.    Final next level of care: Home/Self Care Barriers to Discharge: Barriers Resolved   Patient Goals and CMS Choice Patient states their goals for this hospitalization and ongoing recovery are:: Return home CMS Medicare.gov Compare Post Acute Care list provided to:: Patient Choice offered to / list presented to : Patient  Discharge Placement                    Patient and family notified of of transfer: 12/25/20  Discharge Plan and Services                DME Arranged: Oxygen DME Agency: NA       HH Arranged: NA HH Agency: NA        Social Determinants of Health (SDOH) Interventions     Readmission Risk Interventions No flowsheet data found.

## 2020-12-25 NOTE — Discharge Summary (Signed)
Physician Discharge Summary  [name] JXB:[id number] DOB: 09-05-1951 DOA: 12/21/2020  PCP: Corey NevinsFusco, Lawrence, MD  Admit date: 12/21/2020 Discharge date: 12/25/2020  Time spent: 35 minutes  Recommendations for Outpatient Follow-up:  1. Repeat basic metabolic panel to follow across renal function 2. Reassess blood pressure and adjust antihypertensive treatment as needed 3. Reassess the need for oxygen supplementation 4. Repeat chest x-ray in 6 weeks to assure complete resolution of infiltrates 5. Close monitoring of patient's CBGs with further adjustment to hypoglycemia regimen as needed.   Discharge Diagnoses:  Principal Problem:   Acute respiratory disease due to COVID-19 virus Active Problems:   Myasthenia gravis (HCC)   Persistent atrial fibrillation (HCC)   Pneumonia due to COVID-19 virus   Acute respiratory failure with hypoxia Due to Covid   Chronic anticoagulation   Type 2 diabetes mellitus with hyperglycemia, without long-term current use of insulin (HCC)   Discharge Condition: Stable and improved.  Discharged home with instruction to follow-up with PCP in 2 weeks.  CODE STATUS: Full code.  Diet recommendation: Heart healthy modified carbohydrate diet.  Filed Weights   12/21/20 0940 12/21/20 1746  Weight: 97.5 kg 96.6 kg    History of present illness:  As per H&P written by Dr. Mariea ClontsEmokpae on 12/21/2020 DavidMcBrideis a69 y.o.malewith past medical history relevant for hypothyroidism, myasthenia gravis chronically on Imuran for over 20 years now, status post thymectomy,history of dysphagia and esophagitis, chronic atrial fibrillation on Eliquis for anticoagulation, HTN and DMwho presents to the ED with worsening cough dyspnea and found to be hypoxic with O2 sats in the 70s  -Requiring 4 L of oxygen per nasal cannula --fever with temp above 103,he had associated tachycardia and tachypnea  No Nausea, Vomiting or Diarrhea Additional hx obtained from pts  wife----  After discussion with patient's primary neurologist Dr. Eudelia BunchVern Hernandez [phone]---[facility]y department,also further discussion with Dr. Craige CottaSood from pulmonary critical care service---decision was made to continue patient on Imuran for his myasthenia gravis due to the fact that Imuran'simmunosuppressant effects arepretty long-lasting,stopping Imuran right now will not improve patient immunosuppressed status forat least another month -Also after discussion with Dr. Harland DingwallVern Hernandez's Teamincluding his fellow Dr. Dorothyann PengSasta) and Dr. Barry DienesSood--decision was made to treat patient with baricitinib to hopefully help improve survival rate for patient's Covid pneumonia infection --Of note patient was treated for COVID-19 infection back in December 2019 -Patient isNot vaccinated due to immunocompromise state  --Patient symptoms started on 12[place]12[place]2020 [place]he had tested negative on 12[place]11/2020 while in [place] -- -He tested positive [place] on 12/13/2020 after returning from [place] -Creatinine is up to 1.46 baseline around 1.2  Hospital Course:  1-acute respiratory failure with hypoxia due to COVID-19 pneumonia -Patient has completed antiviral infusion (remdesivir) on 12/25/2020 -Steroids tapering instructions have been provided -Able to speak in full sentences and feeling ready to go home. -Patient instructed to use flutter valve, incentive spirometer and to be diligent with proning position and increase activity. -Continue as needed antitussive/mucolytic's medication -Continue to wean off oxygen supplementation as tolerated.  2-Myasthenia gravis (HCC) -Continue treatment with Imuran -Continue outpatient follow-up with neurology service after discharge.[name].  3-Persistent atrial fibrillation (HCC) -continue cardizem -continue eliquis  4-hypothyroidism -continue synthroid   5-type 2 Diabetes -Patient advised to follow  modified carbohydrate diet -Resume home oral hypoglycemic agent -Expected elevated CBGs while using steroids  6-HTN -Advised to follow heart healthy diet. -Continue home antihypertensive agents.  7-acute kidney injury -Resolve within  normal limits at discharge -Creatinine 1.04 -Safe to resume home medications.  8-BPH -No complaints of urinary retention -Continue home alpha-blocker regimen.  Procedures: See below for x-ray report  Consultations:  None  Discharge Exam: Vitals:   12/25/20 0500 12/25/20 0915  BP: (!) 143/74 137/76  Pulse: (!) 56 81  Resp: 18 19  Temp: 97.6 F (36.4 C)   SpO2: 96% 94%    General: Afebrile, no chest pain, no nausea, no vomiting.  Able to speak in full sentences and expressing just mild short winded sensation with activity.  2 L nasal cannula supplementation with ambulation reported.  Feeling ready to go home. Cardiovascular: Rate controlled, no rubs, no gallops, no JVD. Respiratory: Improved air movement bilaterally; no wheezing, no crackles, no using accessory muscles Abdomen: Soft, nontender, nondistended, positive bowel sounds Extremities: No cyanosis or clubbing.  Discharge Instructions   Discharge Instructions    Diet - low sodium heart healthy   Complete by: As directed    Diet Carb Modified   Complete by: As directed    Discharge instructions   Complete by: As directed    Arrange follow-up with PCP in 2 weeks Maintain adequate hydration Follow modified carbohydrate diet Take medications as prescribed Continue to practice the 3W's: Wear mask, Wait 6 feet apart and Wash hands frequently.   Increase activity slowly   Complete by: As directed      Allergies as of 12/25/2020      Reactions   Levofloxacin Anaphylaxis   Tequin [gatifloxacin] Anaphylaxis   Aminoglycosides Other (See Comments)   REACTION: Myasthenia Gravis (MG) Medication Alert   Avelox [moxifloxacin Hcl In Nacl] Other (See Comments)   REACTION:  FLUOROQUINOLONE ANTIBIOTICS: Myasthenia Gravis (MG) Medication Alert   Botox [onabotulinumtoxina] Other (See Comments)   REACTION: Myasthenia Gravis (MG) Medication Alert   Botulinum Toxins Other (See Comments)   REACTION: Myasthenia Gravis (MG) Medication Alert   Calcium Channel Blockers Other (See Comments)   (BLOOD PRESSURE MEDICATION) REACTION: Myasthenia Gravis (MG) Medication Alert   Ciprofloxacin Other (See Comments)   REACTION: FLUOROQUINOLONE ANTIBIOTICS: Myasthenia Gravis (MG) Medication Alert   Contrast Media [iodinated Diagnostic Agents] Other (See Comments)   MD told pt cannot have   Curare [tubocurarine] Other (See Comments)   (Usually only used during surgery)  REACTION: Myasthenia Gravis (MG) Medication Alert   Dysport [abobotulinumtoxina] Other (See Comments)   REACTION: Myasthenia Gravis (MG) Medication Alert   Erythromycin Other (See Comments)   REACTION: Myasthenia Gravis (MG) Medication Alert   Factive [gemifloxacin] Other (See Comments)   REACTION: FLUOROQUINOLONE ANTIBIOTICS: Myasthenia Gravis (MG) Medication Alert   Floxin [ofloxacin] Other (See Comments)   REACTION: FLUOROQUINOLONE ANTIBIOTICS: Myasthenia Gravis (MG) Medication Alert   Gentamycin [gentamicin] Other (See Comments)   REACTION:Myasthenia Gravis (MG) Medication Alert   Interferons Other (See Comments)   REACTION: Myasthenia Gravis (MG) Medication Alert   Kanamycin Other (See Comments)   REACTION: Myasthenia Gravis (MG) Medication Alert   Ketek [telithromycin] Other (See Comments)   REACTION: Myasthenia Gravis (MG) Medication Alert   Levaquin [levofloxacin In D5w] Other (See Comments)   REACTION: FLUOROQUINOLONE ANTIBIOTICS: Myasthenia Gravis (MG) Medication Alert   Limonene Other (See Comments)   REACTION:Myasthenia Gravis (MG) Medication Alert   Macrolides And Ketolides Other (See Comments)   REACTION: Myasthenia Gravis (MG) Medication Alert REACTION: Myasthenia Gravis (MG) Medication Alert    Magnesium-containing Compounds Other (See Comments)   Unknown   Myobloc [rimabotulinumtoxinb] Other (See Comments)   REACTION: Myasthenia Gravis (MG) Medication Alert  Neomycin Other (See Comments)   REACTION: Myasthenia Gravis (MG) Medication Alert   Noroxin [norfloxacin] Other (See Comments)   REACTION: FLUOROQUINOLONE ANTIBIOTICS: Myasthenia Gravis (MG) Medication Alert   Other Other (See Comments)   MAGNESIUM SALTS: Milk of Magnesia, some antacids (Maalox, Mylanta) REACTION: Myasthenia Gravis (MG) Medication Alerta   Penicillamine Other (See Comments)   D-PENICILLAMINE *DO NOT CONFUSE WITH PENICILLIN*  REACTION: Myasthenia Gravis (MG) Medication Alert   Procainamide Other (See Comments)   REACTION: Myasthenia Gravis (MG) Medication Alert   Propranolol Other (See Comments)   REACTION: Myasthenia Gravis (MG) Medication Alert   Quinidine Other (See Comments)   REACTION: Myasthenia Gravis (MG) Medication Alert   Quinine Derivatives Other (See Comments)   REACTION: Myasthenia Gravis (MG) Medication Alert   Streptomycin Other (See Comments)   REACTION:  Myasthenia Gravis (MG) Medication Alert   Timolol Other (See Comments)   REACTION: Myasthenia Gravis (MG) Medication Alert   Tobramycin Other (See Comments)   REACTION: Myasthenia Gravis (MG) Medication Alert   Xeomin [incobotulinumtoxina] Other (See Comments)   REACTION: Myasthenia Gravis (MG) Medication Alert   Zithromax [azithromycin] Other (See Comments)   REACTION: Myasthenia Gravis (MG) Medication Alert      Medication List    STOP taking these medications   amoxicillin-clavulanate 875-125 MG tablet Commonly known as: AUGMENTIN   doxycycline 100 MG tablet Commonly known as: VIBRA-TABS   sulfamethoxazole-trimethoprim 800-160 MG tablet Commonly known as: BACTRIM DS     TAKE these medications   acyclovir 400 MG tablet Commonly known as: ZOVIRAX Take 800 mg by mouth daily.   apixaban 5 MG Tabs tablet Commonly  known as: Eliquis Take 1 tablet (5 mg total) by mouth 2 (two) times daily.   azaTHIOprine 50 MG tablet Commonly known as: IMURAN Take 150 mg by mouth daily.   budesonide 0.5 MG/2ML nebulizer solution Commonly known as: PULMICORT Take 0.5 mg by nebulization every 6 (six) hours as needed (SOB/wheezing).   Calcium 600+D 600-200 MG-UNIT Tabs Generic drug: Calcium Carbonate-Vitamin D Take 2 tablets by mouth daily after lunch.   chlorpheniramine-HYDROcodone 10-8 MG/5ML Suer Commonly known as: TUSSIONEX Take 5 mLs by mouth every 12 (twelve) hours as needed for cough.   diltiazem 120 MG 24 hr capsule Commonly known as: CARDIZEM CD Take 1 capsule (120 mg total) by mouth daily.   Fish Oil Ultra 1400 MG Caps Take 1,400 mg by mouth daily after lunch.   losartan 50 MG tablet Commonly known as: COZAAR Take 1 tablet (50 mg total) by mouth daily.   metFORMIN 500 MG 24 hr tablet Commonly known as: GLUCOPHAGE-XR Take 1,000 mg by mouth 2 (two) times daily.   omeprazole 20 MG capsule Commonly known as: PRILOSEC TAKE (1) CAPSULE BY MOUTH TWICE DAILY. What changed: See the new instructions.   OneTouch Verio test strip Generic drug: glucose blood 1 each 3 (three) times daily.   predniSONE 20 MG tablet Commonly known as: DELTASONE Take 3 tablets by mouth daily x2 days; then 2 tablets by mouth daily x2 days; then 1 tablet by mouth daily x3 days; then half tablet by mouth daily x3 days and stop prednisone.   Saw Palmetto 450 MG Caps Take 2,250 mg by mouth daily after lunch.   Synthroid 137 MCG tablet Generic drug: levothyroxine Take 137 mcg by mouth daily before breakfast.   tadalafil 20 MG tablet Commonly known as: CIALIS TAKE 1 TABLET BY MOUTH AS NEEDED What changed:   when to take this  reasons to take  this   tamsulosin 0.4 MG Caps capsule Commonly known as: FLOMAX TAKE 1 CAPSULE BY MOUTH TWICE DAILY What changed: when to take this   vitamin C 1000 MG tablet Take 2,000  mg by mouth daily after lunch.   vitamin E 180 MG (400 UNITS) capsule Take 800 Units by mouth daily after lunch.   zinc sulfate 220 (50 Zn) MG capsule Take 1 capsule (220 mg total) by mouth daily. Start taking on: December 26, 2020            Durable Medical Equipment  (From admission, onward)         Start     Ordered   12/25/20 1100  For home use only DME oxygen  Once       Question Answer Comment  Length of Need 6 Months   Mode or (Route) Nasal cannula   Liters per Minute 2   Frequency Continuous (stationary and portable oxygen unit needed)   Oxygen conserving device Yes   Oxygen delivery system Gas      12/25/20 1100         Allergies  Allergen Reactions  . Levofloxacin Anaphylaxis  . Tequin [Gatifloxacin] Anaphylaxis  . Aminoglycosides Other (See Comments)    REACTION: Myasthenia Gravis (MG) Medication Alert  . Avelox [Moxifloxacin Hcl In Nacl] Other (See Comments)    REACTION: FLUOROQUINOLONE ANTIBIOTICS: Myasthenia Gravis (MG) Medication Alert  . Botox [Onabotulinumtoxina] Other (See Comments)    REACTION: Myasthenia Gravis (MG) Medication Alert  . Botulinum Toxins Other (See Comments)    REACTION: Myasthenia Gravis (MG) Medication Alert  . Calcium Channel Blockers Other (See Comments)    (BLOOD PRESSURE MEDICATION) REACTION: Myasthenia Gravis (MG) Medication Alert  . Ciprofloxacin Other (See Comments)    REACTION: FLUOROQUINOLONE ANTIBIOTICS: Myasthenia Gravis (MG) Medication Alert  . Contrast Media [Iodinated Diagnostic Agents] Other (See Comments)    MD told pt cannot have  . Curare [Tubocurarine] Other (See Comments)    (Usually only used during surgery)  REACTION: Myasthenia Gravis (MG) Medication Alert  . Dysport [Abobotulinumtoxina] Other (See Comments)    REACTION: Myasthenia Gravis (MG) Medication Alert  . Erythromycin Other (See Comments)    REACTION: Myasthenia Gravis (MG) Medication Alert  . Factive [Gemifloxacin] Other (See Comments)     REACTION: FLUOROQUINOLONE ANTIBIOTICS: Myasthenia Gravis (MG) Medication Alert  . Floxin [Ofloxacin] Other (See Comments)    REACTION: FLUOROQUINOLONE ANTIBIOTICS: Myasthenia Gravis (MG) Medication Alert  . Gentamycin [Gentamicin] Other (See Comments)    REACTION:Myasthenia Gravis (MG) Medication Alert  . Interferons Other (See Comments)    REACTION: Myasthenia Gravis (MG) Medication Alert  . Kanamycin Other (See Comments)    REACTION: Myasthenia Gravis (MG) Medication Alert  . Ketek [Telithromycin] Other (See Comments)    REACTION: Myasthenia Gravis (MG) Medication Alert  . Levaquin [Levofloxacin In D5w] Other (See Comments)    REACTION: FLUOROQUINOLONE ANTIBIOTICS: Myasthenia Gravis (MG) Medication Alert  . Limonene Other (See Comments)    REACTION:Myasthenia Gravis (MG) Medication Alert  . Macrolides And Ketolides Other (See Comments)    REACTION: Myasthenia Gravis (MG) Medication Alert REACTION: Myasthenia Gravis (MG) Medication Alert  . Magnesium-Containing Compounds Other (See Comments)    Unknown  . Myobloc [Rimabotulinumtoxinb] Other (See Comments)    REACTION: Myasthenia Gravis (MG) Medication Alert  . Neomycin Other (See Comments)    REACTION: Myasthenia Gravis (MG) Medication Alert  . Noroxin [Norfloxacin] Other (See Comments)    REACTION: FLUOROQUINOLONE ANTIBIOTICS: Myasthenia Gravis (MG) Medication Alert  . Other Other (  See Comments)    MAGNESIUM SALTS: Milk of Magnesia, some antacids (Maalox, Mylanta) REACTION: Myasthenia Gravis (MG) Medication Alerta  . Penicillamine Other (See Comments)    D-PENICILLAMINE *DO NOT CONFUSE WITH PENICILLIN*  REACTION: Myasthenia Gravis (MG) Medication Alert  . Procainamide Other (See Comments)    REACTION: Myasthenia Gravis (MG) Medication Alert  . Propranolol Other (See Comments)    REACTION: Myasthenia Gravis (MG) Medication Alert  . Quinidine Other (See Comments)    REACTION: Myasthenia Gravis (MG) Medication Alert  . Quinine  Derivatives Other (See Comments)    REACTION: Myasthenia Gravis (MG) Medication Alert  . Streptomycin Other (See Comments)    REACTION:  Myasthenia Gravis (MG) Medication Alert  . Timolol Other (See Comments)    REACTION: Myasthenia Gravis (MG) Medication Alert  . Tobramycin Other (See Comments)    REACTION: Myasthenia Gravis (MG) Medication Alert  . Xeomin [Incobotulinumtoxina] Other (See Comments)    REACTION: Myasthenia Gravis (MG) Medication Alert  . Zithromax [Azithromycin] Other (See Comments)    REACTION: Myasthenia Gravis (MG) Medication Alert    Follow-up Information    Corey Nevins, MD. Schedule an appointment as soon as possible for a visit in 2 week(s).   Specialty: Internal Medicine Contact information: [address] [place] [address] [phone]        Laqueta Linden, MD .   Specialty: Cardiology Contact information: [address] [place] [address] [phone]        Lanier Prude, MD .   Specialties: Cardiology, Radiology Contact information: [address] [place] [address] [phone]                The results of significant diagnostics from this hospitalization (including imaging, microbiology, ancillary and laboratory) are listed below for reference.    Significant Diagnostic Studies: DG Chest Port 1 View  Result Date: 12/21/2020 CLINICAL DATA:  COVID pneumonia.  Hypoxia. EXAM: PORTABLE CHEST 1 VIEW COMPARISON:  12/27/2018 FINDINGS: Previous median sternotomy. Decreased lung volumes with asymmetric elevation of the right hemidiaphragm. Small to moderate left pleural effusion. Patchy peripheral opacities are identified within the right mid and right lower lung as well as the left mid and left lower lobe. IMPRESSION: 1. Bilateral peripheral airspace opacities compatible with multifocal pneumonia. 2. Small to moderate left pleural effusion. 3. Decreased lung volumes. Electronically Signed   By: Signa Kell M.D.   On: 12/21/2020 10:16    Microbiology: Recent Results (from the past 240 hour(s))  Blood Culture (routine x 2)     Status: None (Preliminary result)   Collection Time: 12/21/20  9:51 AM   Specimen: BLOOD  Result Value Ref Range Status   Specimen Description BLOOD RIGHT ANTECUBITAL  Final   Special Requests   Final    BOTTLES DRAWN AEROBIC AND ANAEROBIC Blood Culture adequate volume   Culture   Final    NO GROWTH 4 DAYS Performed at [facility], [address] [place], [place] [address]    Report Status PENDING  Incomplete  Blood Culture (routine x 2)     Status: None (Preliminary result)   Collection Time: 12/21/20  9:56 AM   Specimen: BLOOD  Result Value Ref Range Status   Specimen Description BLOOD LEFT ANTECUBITAL  Final   Special Requests   Final    BOTTLES DRAWN AEROBIC AND ANAEROBIC Blood Culture adequate volume   Culture   Final    NO GROWTH 4 DAYS Performed at [facility], [address] [place], [place] [address]    Report Status PENDING  Incomplete     Labs: Basic Metabolic Panel: Recent Labs  Lab 12/21/20 0951 12/22/20 0624 12/23/20 0635 12/24/20 0428 12/25/20 0655  NA 133* 133* 135 136 136  K 4.0 4.3 4.3 4.3 4.1  CL 98 100 101 101 101  CO2 24 24 26 25 24   GLUCOSE 172* 223* 190* 210* 156*  BUN 20 35* 31* 30* 30*  CREATININE 1.46* 1.29* 1.09 1.08 1.04  CALCIUM 9.0 9.0 9.2 9.3 8.9  MG  --  1.4* 1.6* 1.6* 1.8  PHOS  --  3.3 3.6 3.3 3.3   Liver Function Tests: Recent Labs  Lab 12/21/20 0951 12/22/20 0624 12/23/20 0635 12/24/20 0428 12/25/20 0655  AST 56* 33 32 27 46*  ALT 37 31 28 27  46*  ALKPHOS 81 68 61 58 56  BILITOT 2.0* 1.1 0.9 1.1 1.0  PROT 7.7 7.1 6.8 6.5 6.6  ALBUMIN 4.0 3.6 3.5 3.4* 3.5   CBC: Recent Labs  Lab 12/21/20 0951 12/22/20 0624 12/23/20 0635 12/24/20 0428 12/25/20 0655  WBC 8.6 8.4 9.5 13.4* 16.7*  NEUTROABS 7.0 7.0 8.6* 12.0* 14.4*  HGB 14.2 13.0 12.3* 12.7* 13.3  HCT 42.6 36.8* 33.0* 36.8* 35.4*   MCV 95.5 94.6 97.6 92.9 96.5  PLT 193 188 219 234 274   CBG: Recent Labs  Lab 12/24/20 1117 12/24/20 1604 12/24/20 2048 12/25/20 0821 12/25/20 1219  GLUCAP 351* 237* 271* 151* 220*    Signed:  02/22/21 MD.  [facility] 12/25/2020, 12:24 PM

## 2020-12-26 LAB — CULTURE, BLOOD (ROUTINE X 2)
Culture: NO GROWTH
Culture: NO GROWTH
Special Requests: ADEQUATE
Special Requests: ADEQUATE

## 2020-12-30 ENCOUNTER — Other Ambulatory Visit: Payer: Self-pay

## 2020-12-30 ENCOUNTER — Encounter: Payer: Self-pay | Admitting: Pulmonary Disease

## 2020-12-30 ENCOUNTER — Ambulatory Visit (INDEPENDENT_AMBULATORY_CARE_PROVIDER_SITE_OTHER): Payer: Managed Care, Other (non HMO) | Admitting: Pulmonary Disease

## 2020-12-30 VITALS — BP 122/70 | HR 91 | Temp 98.0°F | Ht 73.0 in | Wt 204.0 lb

## 2020-12-30 DIAGNOSIS — R0602 Shortness of breath: Secondary | ICD-10-CM | POA: Diagnosis not present

## 2020-12-30 NOTE — Progress Notes (Signed)
[name]    [id number]    02/08/1951  Primary Care Physician:Fusco, Lyman Bishop, MD  Referring Physician: Elfredia Nevins, MD [address] [place],  [place] [address]  Chief complaint:   Was recently discharged from the hospital following Covid pneumonia  HPI:  He is feeling better On weaning doses of steroids Was discharged on oxygen supplementation He is weaning down the oxygen supplementation  He does have occasional cough Takes him a while to get himself going in the morning  History of myasthenia gravis, thymectomy He continues to feel better  Never smoker   Outpatient Encounter Medications as of 12/30/2020  Medication Sig  . acyclovir (ZOVIRAX) 400 MG tablet Take 800 mg by mouth daily.   [name] apixaban (ELIQUIS) 5 MG TABS tablet Take 1 tablet (5 mg total) by mouth 2 (two) times daily.  . Ascorbic Acid (VITAMIN C) 1000 MG tablet Take 2,000 mg by mouth daily after lunch.   . azaTHIOprine (IMURAN) 50 MG tablet Take 150 mg by mouth daily.   . Calcium Carbonate-Vitamin D (CALCIUM 600+D) 600-200 MG-UNIT TABS Take 2 tablets by mouth daily after lunch.   . diltiazem (CARDIZEM CD) 120 MG 24 hr capsule Take 1 capsule (120 mg total) by mouth daily.  [name] losartan (COZAAR) 50 MG tablet Take 1 tablet (50 mg total) by mouth daily.  . metFORMIN (GLUCOPHAGE-XR) 500 MG 24 hr tablet Take 1,000 mg by mouth 2 (two) times daily.  . Omega-3 Fatty Acids (FISH OIL ULTRA) 1400 MG CAPS Take 1,400 mg by mouth daily after lunch.  [name] omeprazole (PRILOSEC) 20 MG capsule TAKE (1) CAPSULE BY MOUTH TWICE DAILY. (Patient taking differently: Take 20 mg by mouth in the morning and at bedtime.)  . ONETOUCH VERIO test strip 1 each 3 (three) times daily.  . predniSONE (DELTASONE) 20 MG tablet Take 3 tablets by mouth daily x2 days; then 2 tablets by mouth daily x2 days; then 1 tablet by mouth daily x3 days; then half tablet by mouth daily x3 days and stop prednisone.  . Saw Palmetto 450 MG CAPS  Take 2,250 mg by mouth daily after lunch.  . SYNTHROID 137 MCG tablet Take 137 mcg by mouth daily before breakfast.  . tadalafil (CIALIS) 20 MG tablet TAKE 1 TABLET BY MOUTH AS NEEDED (Patient taking differently: Take 20 mg by mouth daily as needed for erectile dysfunction.)  . tamsulosin (FLOMAX) 0.4 MG CAPS capsule TAKE 1 CAPSULE BY MOUTH TWICE DAILY (Patient taking differently: Take 0.4 mg by mouth in the morning and at bedtime.)  . vitamin E 180 MG (400 UNITS) capsule Take 800 Units by mouth daily after lunch.  . zinc sulfate 220 (50 Zn) MG capsule Take 1 capsule (220 mg total) by mouth daily.  . budesonide (PULMICORT) 0.5 MG/2ML nebulizer solution Take 0.5 mg by nebulization every 6 (six) hours as needed (SOB/wheezing). (Patient not taking: Reported on 12/30/2020)  . chlorpheniramine-HYDROcodone (TUSSIONEX) 10-8 MG/5ML SUER Take 5 mLs by mouth every 12 (twelve) hours as needed for cough. (Patient not taking: Reported on 12/30/2020)   No facility-administered encounter medications on file as of 12/30/2020.    Allergies as of 12/30/2020 - Review Complete 12/30/2020  Allergen Reaction Noted  . Levofloxacin Anaphylaxis 03/01/2011  . Tequin [gatifloxacin] Anaphylaxis 06/07/2012  . Aminoglycosides Other (See Comments) 06/07/2012  . Avelox [moxifloxacin hcl in nacl] Other (See Comments) 06/07/2012  . Botox [onabotulinumtoxina] Other (See Comments) 06/07/2012  . Botulinum toxins Other (See Comments) 06/07/2012  .  Calcium channel blockers Other (See Comments) 06/07/2012  . Ciprofloxacin Other (See Comments) 06/07/2012  . Contrast media [iodinated diagnostic agents] Other (See Comments) 02/07/2018  . Curare [tubocurarine] Other (See Comments) 06/07/2012  . Dysport [abobotulinumtoxina] Other (See Comments) 06/07/2012  . Erythromycin Other (See Comments) 06/07/2012  . Factive [gemifloxacin] Other (See Comments) 06/07/2012  . Floxin [ofloxacin] Other (See Comments) 06/07/2012  . Gentamycin  [gentamicin] Other (See Comments) 06/07/2012  . Interferons Other (See Comments) 06/07/2012  . Kanamycin Other (See Comments) 06/07/2012  . Ketek [telithromycin] Other (See Comments) 06/07/2012  . Levaquin [levofloxacin in d5w] Other (See Comments) 06/07/2012  . Limonene Other (See Comments) 02/10/2015  . Macrolides and ketolides Other (See Comments) 06/07/2012  . Magnesium-containing compounds Other (See Comments) 04/01/2014  . Myobloc [rimabotulinumtoxinb] Other (See Comments) 06/07/2012  . Neomycin Other (See Comments) 06/07/2012  . Noroxin [norfloxacin] Other (See Comments) 06/07/2012  . Other Other (See Comments) 06/07/2012  . Penicillamine Other (See Comments) 06/07/2012  . Procainamide Other (See Comments) 06/07/2012  . Propranolol Other (See Comments) 06/07/2012  . Quinidine Other (See Comments) 06/07/2012  . Quinine derivatives Other (See Comments) 06/07/2012  . Streptomycin Other (See Comments) 06/07/2012  . Timolol Other (See Comments) 06/07/2012  . Tobramycin Other (See Comments) 06/07/2012  . Xeomin [incobotulinumtoxina] Other (See Comments) 06/07/2012  . Zithromax [azithromycin] Other (See Comments) 06/07/2012    Past Medical History:  Diagnosis Date  . Candida esophagitis (HCC) OCT 2014  . Deafness in right ear   . Diabetes mellitus without complication (HCC)   . Difficult intubation   . Esophageal stricture SEP 2011   FOOD IMPACTION-->SAV DIL 16 MM  . GERD (gastroesophageal reflux disease)   . Hypertension   . Kidney stone   . Myasthenia gravis (HCC)   . Sleep apnea   . Thyroid disease     Past Surgical History:  Procedure Laterality Date  . ATRIAL FIBRILLATION ABLATION N/A 08/12/2020   Procedure: ATRIAL FIBRILLATION ABLATION;  Surgeon: Lanier Prude, MD;  Location: [facility] INVASIVE CV LAB;  Service: Cardiovascular;  Laterality: N/A;  . BIOPSY  02/14/2018   Procedure: BIOPSY;  Surgeon: West Bali, MD;  [facility];  Service: Endoscopy;;   gastric  . CARDIOVERSION N/A 06/08/2020   Procedure: CARDIOVERSION;  Surgeon: Antoine Poche, MD;  [facility];  Service: Endoscopy;  Laterality: N/A;  . CARDIOVERSION N/A 09/16/2020   Procedure: CARDIOVERSION;  Surgeon: Meriam Sprague, MD;  Location: [facility] ENDOSCOPY;  Service: Cardiovascular;  Laterality: N/A;  . CHOLECYSTECTOMY    . COLONOSCOPY  2005   Dr. Lovell Sheehan  . COLONOSCOPY N/A 07/17/2014   Procedure: COLONOSCOPY;  Surgeon: West Bali, MD;  Location: [facility];  Service: Endoscopy;  Laterality: N/A;  9:30-rescheduled 8/28 same time Soledad Gerlach to notify pt  . ESOPHAGOGASTRODUODENOSCOPY N/A 07/25/2013   Shallow ulcer in the cardia, mid esophageal web, stricture at GE junction  . ESOPHAGOGASTRODUODENOSCOPY N/A 02/14/2018   Procedure: ESOPHAGOGASTRODUODENOSCOPY (EGD);  Surgeon: West Bali, MD;  [facility]: [facility];  Service: Endoscopy;  Laterality: N/A;  11:15am  . ESOPHAGOGASTRODUODENOSCOPY (EGD) WITH ESOPHAGEAL DILATION N/A 08/29/2013   Candida esophagitis, small hiatal hernia, moderate nonerosive gastritis, mid esophageal web  . HERNIA REPAIR     right inguinal  . KIDNEY STONE SURGERY    . NASAL SEPTUM SURGERY    . PALATE SURGERY        . SAVORY DILATION N/A 02/14/2018   Procedure: SAVORY DILATION;  Surgeon: Jonette Eva  L, MD;  [facility];  Service: Endoscopy;  Laterality: N/A;  . TEE WITHOUT CARDIOVERSION N/A 08/10/2020   Procedure: TRANSESOPHAGEAL ECHOCARDIOGRAM (TEE);  Surgeon: Chilton Si, MD;  Location[facility] ENDOSCOPY;  Service: Cardiovascular;  Laterality: N/A;  . THYMECTOMY    . TONSILLECTOMY    . UPPER GASTROINTESTINAL ENDOSCOPY  SEP 2011   SAV DIL    Family History  Problem Relation Age of Onset  . Liver disease Father        age 52, deceased  . Colon cancer Neg Hx     Social History   Socioeconomic History  . Marital status: Married    Spouse name: Not on file  . Number of children: Not on file  . Years of  education: Not on file  . Highest education level: Not on file  Occupational History  . Not on file  Tobacco Use  . Smoking status: Never Smoker  . Smokeless tobacco: Never Used  Vaping Use  . Vaping Use: Never used  Substance and Sexual Activity  . Alcohol use: No  . Drug use: No  . Sexual activity: Yes    Partners: Female    Birth control/protection: None    Comment: spouse  Other Topics Concern  . Not on file  Social History Narrative  . Not on file   Social Determinants of Health   Financial Resource Strain: Not on file  Food Insecurity: Not on file  Transportation Needs: Not on file  Physical Activity: Not on file  Stress: Not on file  Social Connections: Not on file  Intimate Partner Violence: Not on file    Review of Systems  Constitutional: Negative.   HENT: Negative.   Eyes: Negative.   Respiratory: Positive for cough. Negative for shortness of breath and wheezing.   Cardiovascular: Negative.   Gastrointestinal: Negative.    Vitals:   12/30/20 1524  BP: 122/70  Pulse: 91  Temp: 98 F (36.7 C)  SpO2: 96%   Physical Exam Constitutional:      Appearance: He is well-developed.  HENT:     Head: Normocephalic and atraumatic.  Eyes:     General:        Right eye: No discharge.        Left eye: No discharge.  Neck:     Thyroid: No thyromegaly.     Trachea: No tracheal deviation.  Cardiovascular:     Rate and Rhythm: Normal rate and regular rhythm.  Pulmonary:     Effort: Pulmonary effort is normal. No respiratory distress.     Breath sounds: Normal breath sounds. No stridor. No wheezing.  Abdominal:     General: Bowel sounds are normal. There is no distension.     Palpations: Abdomen is soft.  Musculoskeletal:     Cervical back: No rigidity or tenderness.  Neurological:     Mental Status: He is alert.  Psychiatric:        Mood and Affect: Mood normal.    Data Reviewed: Chest x-ray reviewed showing bibasal infiltrates  Assessment:   Recent Covid pneumonia  Hypoxic respiratory failure  Continue weaning doses of steroids  History of myasthenia gravis  He is improving  Plan/Recommendations:  Continue oxygen supplementation  Wean down dose of steroids  I will see you back in 4 weeks  Encouraged to give me a call if he does not continue to feel better    Virl Diamond MD [facility] 12/30/2020, 3:47 PM  CC:  Elfredia Nevins, MD

## 2020-12-30 NOTE — Patient Instructions (Signed)
Follow-up in 4 weeks  Continue to wean off the prednisone Continue to wean down oxygen  Call us if you are not feeling better after being off the prednisone Call us if the trend of improvement does not continue  You may need an x-ray at some point  Will see you in 4 weeks

## 2021-01-09 NOTE — Progress Notes (Signed)
Cardiology Office Note Date:  01/11/2021  Patient ID:  [name], DOB 1951-07-19, MRN [id number] PCP:  Elfredia Nevins, MD  Cardiologist:  Dr. Purvis Sheffield historically Electrophysiologist: Dr. Lalla Brothers [facility]: neuromuscular clinic coordinator is Lynnell Dike at office number [phone]     Chief Complaint: post hospital f/u  History of Present Illness: [name] is a 70 y.o. male with history of Myesthenia Gravis (follows with [facility] , HTN, DM, hypothyroidism, afib, h/o thymectomy, CM  Initially seen by Dr. Purvis Sheffield, who investigated and noted extensive list of medications which could potentially provoke a myasthenic crisis Calcium channel blockers and propranolol are listed as medications which could potentially provoke a myasthenic crisis.  Amiodarone appears to be safe as would be atenolol  Referred to Dr. Lalla Brothers Aug 2021, underwent PVI ablation .  Did have a DCCV post ablation.  He last saw Dr. Lalla Brothers 11/09/20, clearly feeling better in SR, back to his usual level of activity and happy with his ablation result. Discussed visiting possibility of stopping his dilt at the 33mo mark if maintaining SR.  Admitted to [facility] 12/21/20 with COVID treated with baricitinib Discharged 12/25/20 with instructions to see PMD in 2 weeks.  Admitting EKG was ST, I  Don't see any evidence that he had AFib during his stay in reviewed of his CV strips.  TODAY He is accompanied by his wife. He is doing quite well post COVID thank goodness. He is very active and feels like his breathing/exertional capacity post illness is steadilly heading back towards normal. He is no longer wearing O2. Saw pulmonology and PMD the week after d/c and slated to seepulm again next week.  He has not felt like he has AFib, but wanted to check in and see how his rhythm looked. I reviewed his EKG and CV strips from his hospital stay and did not see any AF.  He denies any CP, no dizzy spells, near syncope or  syncope.  No bleeding or signs of bleeding  Afib Hx diagnosed April 2021 PVI ablation 08/12/20 DCCV 09/16/20  Past Medical History:  Diagnosis Date  . Candida esophagitis (HCC) OCT 2014  . Deafness in right ear   . Diabetes mellitus without complication (HCC)   . Difficult intubation   . Esophageal stricture SEP 2011   FOOD IMPACTION-->SAV DIL 16 MM  . GERD (gastroesophageal reflux disease)   . Hypertension   . Kidney stone   . Myasthenia gravis (HCC)   . Sleep apnea   . Thyroid disease     Past Surgical History:  Procedure Laterality Date  . ATRIAL FIBRILLATION ABLATION N/A 08/12/2020   Procedure: ATRIAL FIBRILLATION ABLATION;  Surgeon: Lanier Prude, MD;  Location: [facility] INVASIVE CV LAB;  Service: Cardiovascular;  Laterality: N/A;  . BIOPSY  02/14/2018   Procedure: BIOPSY;  Surgeon: West Bali, MD;  [facility];  Service: Endoscopy;;  gastric  . CARDIOVERSION N/A 06/08/2020   Procedure: CARDIOVERSION;  Surgeon: Antoine Poche, MD;  [facility];  Service: Endoscopy;  Laterality: N/A;  . CARDIOVERSION N/A 09/16/2020   Procedure: CARDIOVERSION;  Surgeon: Meriam Sprague, MD;  Location: [facility] ENDOSCOPY;  Service: Cardiovascular;  Laterality: N/A;  . CHOLECYSTECTOMY    . COLONOSCOPY  2005   Dr. Lovell Sheehan  . COLONOSCOPY N/A 07/17/2014   Procedure: COLONOSCOPY;  Surgeon: West Bali, MD;  [facility];  Service: Endoscopy;  Laterality: N/A;  9:30-rescheduled 8/28 same time Soledad Gerlach to notify pt  . ESOPHAGOGASTRODUODENOSCOPY  N/A 07/25/2013   Shallow ulcer in the cardia, mid esophageal web, stricture at GE junction  . ESOPHAGOGASTRODUODENOSCOPY N/A 02/14/2018   Procedure: ESOPHAGOGASTRODUODENOSCOPY (EGD);  Surgeon: West Bali, MD;  [facility];  Service: Endoscopy;  Laterality: N/A;  11:15am  . ESOPHAGOGASTRODUODENOSCOPY (EGD) WITH ESOPHAGEAL DILATION N/A 08/29/2013   Candida esophagitis, small hiatal hernia, moderate  nonerosive gastritis, mid esophageal web  . HERNIA REPAIR     right inguinal  . KIDNEY STONE SURGERY    . NASAL SEPTUM SURGERY    . PALATE SURGERY        . SAVORY DILATION N/A 02/14/2018   Procedure: SAVORY DILATION;  Surgeon: West Bali, MD;  [facility];  Service: Endoscopy;  Laterality: N/A;  . TEE WITHOUT CARDIOVERSION N/A 08/10/2020   Procedure: TRANSESOPHAGEAL ECHOCARDIOGRAM (TEE);  Surgeon: Chilton Si, MD;  Location: [facility] ENDOSCOPY;  Service: Cardiovascular;  Laterality: N/A;  . THYMECTOMY    . TONSILLECTOMY    . UPPER GASTROINTESTINAL ENDOSCOPY  SEP 2011   SAV DIL    Current Outpatient Medications  Medication Sig Dispense Refill  . acyclovir (ZOVIRAX) 400 MG tablet Take 800 mg by mouth daily.     [name] apixaban (ELIQUIS) 5 MG TABS tablet Take 1 tablet (5 mg total) by mouth 2 (two) times daily. 180 tablet 3  . Ascorbic Acid (VITAMIN C) 1000 MG tablet Take 2,000 mg by mouth daily after lunch.     . azaTHIOprine (IMURAN) 50 MG tablet Take 150 mg by mouth daily.     . budesonide (PULMICORT) 0.5 MG/2ML nebulizer solution Take 0.5 mg by nebulization every 6 (six) hours as needed (SOB/wheezing).    . Calcium Carbonate-Vitamin D (CALCIUM 600+D) 600-200 MG-UNIT TABS Take 2 tablets by mouth daily after lunch.     . chlorpheniramine-HYDROcodone (TUSSIONEX) 10-8 MG/5ML SUER Take 5 mLs by mouth every 12 (twelve) hours as needed for cough. 210 mL 0  . diltiazem (CARDIZEM CD) 120 MG 24 hr capsule Take 1 capsule (120 mg total) by mouth daily. 90 capsule 3  . losartan (COZAAR) 50 MG tablet Take 1 tablet (50 mg total) by mouth daily. 90 tablet 3  . metFORMIN (GLUCOPHAGE-XR) 500 MG 24 hr tablet Take 1,000 mg by mouth 2 (two) times daily.    . Omega-3 Fatty Acids (FISH OIL ULTRA) 1400 MG CAPS Take 1,400 mg by mouth daily after lunch.    [name] omeprazole (PRILOSEC) 20 MG capsule TAKE (1) CAPSULE BY MOUTH TWICE DAILY. 180 capsule 3  . ONETOUCH VERIO test strip 1 each 3 (three) times  daily.    . predniSONE (DELTASONE) 20 MG tablet Take 3 tablets by mouth daily x2 days; then 2 tablets by mouth daily x2 days; then 1 tablet by mouth daily x3 days; then half tablet by mouth daily x3 days and stop prednisone. 15 tablet 0  . Saw Palmetto 450 MG CAPS Take 2,250 mg by mouth daily after lunch.    . SYNTHROID 137 MCG tablet Take 137 mcg by mouth daily before breakfast.    . tadalafil (CIALIS) 20 MG tablet TAKE 1 TABLET BY MOUTH AS NEEDED 10 tablet 0  . tamsulosin (FLOMAX) 0.4 MG CAPS capsule TAKE 1 CAPSULE BY MOUTH TWICE DAILY 180 capsule 3  . vitamin E 180 MG (400 UNITS) capsule Take 800 Units by mouth daily after lunch.    . zinc sulfate 220 (50 Zn) MG capsule Take 1 capsule (220 mg total) by mouth daily. 30 capsule 1  No current facility-administered medications for this visit.    Allergies:   Levofloxacin, Tequin [gatifloxacin], Aminoglycosides, Avelox [moxifloxacin hcl in nacl], Botox [onabotulinumtoxina], Botulinum toxins, Calcium channel blockers, Ciprofloxacin, Contrast media [iodinated diagnostic agents], Curare [tubocurarine], Dysport [abobotulinumtoxina], Erythromycin, Factive [gemifloxacin], Floxin [ofloxacin], Gentamycin [gentamicin], Interferons, Kanamycin, Ketek [telithromycin], Levaquin [levofloxacin in d5w], Limonene, Macrolides and ketolides, Magnesium-containing compounds, Myobloc [rimabotulinumtoxinb], Neomycin, Noroxin [norfloxacin], Other, Penicillamine, Procainamide, Propranolol, Quinidine, Quinine derivatives, Streptomycin, Timolol, Tobramycin, Xeomin [incobotulinumtoxina], and Zithromax [azithromycin]   Social History:  The patient  reports that he has never smoked. He has never used smokeless tobacco. He reports that he does not drink alcohol and does not use drugs.   Family History:  The patient's family history includes Liver disease in his father.  ROS:  Please see the history of present illness.    All other systems are reviewed and otherwise negative.    PHYSICAL EXAM:  VS:  BP 124/68   Pulse 98   Ht 6\' 1"  (1.854 m)   Wt 206 lb (93.4 kg)   SpO2 96%   BMI 27.18 kg/m  BMI: Body mass index is 27.18 kg/m. Well nourished, well developed, in no acute distress HEENT: normocephalic, atraumatic Neck: no JVD, carotid bruits or masses Cardiac:  RRR; no significant murmurs, no rubs, or gallops Lungs:  CTA b/l, no wheezing, rhonchi or rales Abd: soft, nontender MS: no deformity or atrophy Ext: no edema Skin: warm and dry, no rash Neuro:  No gross deficits appreciated Psych: euthymic mood, full affect     EKG:  Done today and reviewed by myself shows  SR 84bpm, normal EKG   08/12/20: EPS/ablation CONCLUSIONS: 1. Successful PVI 2. Successful ablation/isolation of the posterior wall 4. Intracardiac echo reveals moderate reduced LV function 5. No early apparent complications.   08/10/2020: TEE IMPRESSIONS  1. Left ventricular ejection fraction, by estimation, is 40 to 45%. The  left ventricle has mildly decreased function. The left ventricle  demonstrates global hypokinesis.  2. Right ventricular systolic function is normal. The right ventricular  size is normal.  3. No left atrial/left atrial appendage thrombus was detected.  4. The mitral valve is normal in structure. Mild mitral valve  regurgitation. No evidence of mitral stenosis.  5. The aortic valve is tricuspid. Aortic valve regurgitation is trivial.  No aortic stenosis is present.   Conclusion(s)/Recommendation(s): Normal biventricular function without  evidence of hemodynamically significant valvular heart disease.     05/11/20: TTE IMPRESSIONS  1. Patient in afib during study diffuse hypokinesis worse in inferior  base consider repeating study when patient in NSR . Left ventricular  ejection fraction, by estimation, is 45 to 50%. The left ventricle has  mildly decreased function. The left  ventricle has no regional wall motion abnormalities. Left  ventricular  diastolic parameters are indeterminate.  2. Right ventricular systolic function is normal. The right ventricular  size is normal.  3. Right atrial size was mildly dilated.  4. The mitral valve is normal in structure. Mild mitral valve  regurgitation. No evidence of mitral stenosis.  5. The aortic valve is tricuspid. Aortic valve regurgitation is mild.  Mild aortic valve sclerosis is present, with no evidence of aortic valve  stenosis.  6. The inferior vena cava is normal in size with greater than 50%  respiratory variability, suggesting right atrial pressure of 3 mmHg.    Recent Labs: 12/22/2020: TSH 2.201 12/25/2020: ALT 46; BUN 30; Creatinine, Ser 1.04; Hemoglobin 13.3; Magnesium 1.8; Platelets 274; Potassium 4.1; Sodium 136  12/21/2020: Triglycerides 101   Estimated Creatinine Clearance: 75.8 mL/min (by C-G formula based on SCr of 1.04 mg/dL).   Wt Readings from Last 3 Encounters:  01/11/21 206 lb (93.4 kg)  12/30/20 204 lb (92.5 kg)  12/21/20 212 lb 15.4 oz (96.6 kg)     Other studies reviewed: Additional studies/records reviewed today include: summarized above  ASSESSMENT AND PLAN:  1. Persistent AFib     CHA2DS2Vasc is 3, on eliquis, appropriately dosed     S/p PVI ablation      No symptoms of AFib  2. Mild CM     No symptoms or exam findings to suggest volume OL     Would plan to repeat echo prior to his visit wth Dr. Lalla Brothers, suspect EF will have normalized maintaining SR     Will plan for a couple months post COVID, perhaps April-may  3. HTN     Looks great, no changes    Disposition: F/u with Dr. Lalla Brothers in June as planned, echo as discussed.  Sooner if needed  Current medicines are reviewed at length with the patient today.  The patient did not have any concerns regarding medicines.  Norma Fredrickson, PA-C 01/11/2021 10:06 AM     [facility] [address] [place] [address] [phone] (office)  [phone] (fax)

## 2021-01-11 ENCOUNTER — Encounter: Payer: Self-pay | Admitting: Physician Assistant

## 2021-01-11 ENCOUNTER — Ambulatory Visit (INDEPENDENT_AMBULATORY_CARE_PROVIDER_SITE_OTHER): Payer: Managed Care, Other (non HMO) | Admitting: Physician Assistant

## 2021-01-11 ENCOUNTER — Other Ambulatory Visit: Payer: Self-pay

## 2021-01-11 VITALS — BP 124/68 | HR 98 | Ht 73.0 in | Wt 206.0 lb

## 2021-01-11 DIAGNOSIS — I10 Essential (primary) hypertension: Secondary | ICD-10-CM

## 2021-01-11 DIAGNOSIS — I428 Other cardiomyopathies: Secondary | ICD-10-CM

## 2021-01-11 DIAGNOSIS — I4819 Other persistent atrial fibrillation: Secondary | ICD-10-CM | POA: Diagnosis not present

## 2021-01-11 NOTE — Patient Instructions (Signed)
Medication Instructions:   Your physician recommends that you continue on your current medications as directed. Please refer to the Current Medication list given to you today.  *If you need a refill on your cardiac medications before your next appointment, please call your pharmacy*   Lab Work: NONE ORDERED  TODAY   If you have labs (blood work) drawn today and your tests are completely normal, you will receive your results only by: [name] MyChart Message (if you have MyChart) OR . A paper copy in the mail If you have any lab test that is abnormal or we need to change your treatment, we will call you to review the results.   Testing/Procedures:  In May Your physician has requested that you have an echocardiogram. Echocardiography is a painless test that uses sound waves to create images of your heart. It provides your doctor with information about the size and shape of your heart and how well your heart's chambers and valves are working. This procedure takes approximately one hour. There are no restrictions for this procedure.   Follow-Up: At [facility], you and your health needs are our priority.  As part of our continuing mission to provide you with exceptional heart care, we have created designated Provider Care Teams.  These Care Teams include your primary Cardiologist (physician) and Advanced Practice Providers (APPs -  Physician Assistants and Nurse Practitioners) who all work together to provide you with the care you need, when you need it.  We recommend signing up for the patient portal called "MyChart".  Sign up information is provided on this After Visit Summary.  MyChart is used to connect with patients for Virtual Visits (Telemedicine).  Patients are able to view lab/test results, encounter notes, upcoming appointments, etc.  Non-urgent messages can be sent to your provider as well.   To learn more about what you can do with MyChart, go to [email or web address].    Your next  appointment:   4 month(s) RECALL   The format for your next appointment:   In Person  Provider:   Steffanie Dunn, MD   Other Instructions

## 2021-01-27 ENCOUNTER — Ambulatory Visit (INDEPENDENT_AMBULATORY_CARE_PROVIDER_SITE_OTHER): Payer: Managed Care, Other (non HMO) | Admitting: Pulmonary Disease

## 2021-01-27 ENCOUNTER — Encounter: Payer: Self-pay | Admitting: Pulmonary Disease

## 2021-01-27 ENCOUNTER — Ambulatory Visit (INDEPENDENT_AMBULATORY_CARE_PROVIDER_SITE_OTHER): Payer: Managed Care, Other (non HMO)

## 2021-01-27 ENCOUNTER — Other Ambulatory Visit: Payer: Self-pay

## 2021-01-27 VITALS — BP 126/78 | HR 87 | Temp 97.9°F | Ht 73.0 in | Wt 211.0 lb

## 2021-01-27 DIAGNOSIS — R0602 Shortness of breath: Secondary | ICD-10-CM | POA: Diagnosis not present

## 2021-01-27 NOTE — Progress Notes (Signed)
[name]    [id number]    12/30/1950  Primary Care Physician:Fusco, Lyman Bishop, MD  Referring Physician: Elfredia Nevins, MD [address] [place],  [place] [address]  Chief complaint:   Was recently discharged from the hospital following Covid pneumonia Continues to improve HPI:  He is feeling better Still has an occasional cough with clear mucus Exercise ability is improving  Has not needed to use his oxygen as saturations have been staying within normal limits  History of myasthenia gravis, thymectomy He continues to feel better  Never smoker   Outpatient Encounter Medications as of 01/27/2021  Medication Sig  . acyclovir (ZOVIRAX) 400 MG tablet Take 800 mg by mouth daily.   [name] apixaban (ELIQUIS) 5 MG TABS tablet Take 1 tablet (5 mg total) by mouth 2 (two) times daily.  . Ascorbic Acid (VITAMIN C) 1000 MG tablet Take 2,000 mg by mouth daily after lunch.   . azaTHIOprine (IMURAN) 50 MG tablet Take 150 mg by mouth daily.   . budesonide (PULMICORT) 0.5 MG/2ML nebulizer solution Take 0.5 mg by nebulization every 6 (six) hours as needed (SOB/wheezing).  . Calcium Carbonate-Vitamin D (CALCIUM 600+D) 600-200 MG-UNIT TABS Take 2 tablets by mouth daily after lunch.   . diltiazem (CARDIZEM CD) 120 MG 24 hr capsule Take 1 capsule (120 mg total) by mouth daily.  [name] losartan (COZAAR) 50 MG tablet Take 1 tablet (50 mg total) by mouth daily.  . metFORMIN (GLUCOPHAGE-XR) 500 MG 24 hr tablet Take 1,000 mg by mouth 2 (two) times daily.  . Omega-3 Fatty Acids (FISH OIL ULTRA) 1400 MG CAPS Take 1,400 mg by mouth daily after lunch.  [name] omeprazole (PRILOSEC) 20 MG capsule TAKE (1) CAPSULE BY MOUTH TWICE DAILY.  [name] ONETOUCH VERIO test strip 1 each 3 (three) times daily.  . Saw Palmetto 450 MG CAPS Take 2,250 mg by mouth daily after lunch.  . SYNTHROID 137 MCG tablet Take 137 mcg by mouth daily before breakfast.  . tadalafil (CIALIS) 20 MG tablet TAKE 1 TABLET BY MOUTH AS NEEDED   . tamsulosin (FLOMAX) 0.4 MG CAPS capsule TAKE 1 CAPSULE BY MOUTH TWICE DAILY  . vitamin E 180 MG (400 UNITS) capsule Take 800 Units by mouth daily after lunch.  . zinc sulfate 220 (50 Zn) MG capsule Take 1 capsule (220 mg total) by mouth daily.  . [DISCONTINUED] chlorpheniramine-HYDROcodone (TUSSIONEX) 10-8 MG/5ML SUER Take 5 mLs by mouth every 12 (twelve) hours as needed for cough.  . [DISCONTINUED] predniSONE (DELTASONE) 20 MG tablet Take 3 tablets by mouth daily x2 days; then 2 tablets by mouth daily x2 days; then 1 tablet by mouth daily x3 days; then half tablet by mouth daily x3 days and stop prednisone. (Patient not taking: Reported on 01/27/2021)   No facility-administered encounter medications on file as of 01/27/2021.    Allergies as of 01/27/2021 - Review Complete 01/27/2021  Allergen Reaction Noted  . Levofloxacin Anaphylaxis 03/01/2011  . Tequin [gatifloxacin] Anaphylaxis 06/07/2012  . Aminoglycosides Other (See Comments) 06/07/2012  . Avelox [moxifloxacin hcl in nacl] Other (See Comments) 06/07/2012  . Botox [onabotulinumtoxina] Other (See Comments) 06/07/2012  . Botulinum toxins Other (See Comments) 06/07/2012  . Calcium channel blockers Other (See Comments) 06/07/2012  . Ciprofloxacin Other (See Comments) 06/07/2012  . Contrast media [iodinated diagnostic agents] Other (See Comments) 02/07/2018  . Curare [tubocurarine] Other (See Comments) 06/07/2012  . Dysport [abobotulinumtoxina] Other (See Comments) 06/07/2012  . Erythromycin Other (See Comments) 06/07/2012  .  Factive [gemifloxacin] Other (See Comments) 06/07/2012  . Floxin [ofloxacin] Other (See Comments) 06/07/2012  . Gentamycin [gentamicin] Other (See Comments) 06/07/2012  . Interferons Other (See Comments) 06/07/2012  . Kanamycin Other (See Comments) 06/07/2012  . Ketek [telithromycin] Other (See Comments) 06/07/2012  . Levaquin [levofloxacin in d5w] Other (See Comments) 06/07/2012  . Limonene Other (See Comments)  02/10/2015  . Macrolides and ketolides Other (See Comments) 06/07/2012  . Magnesium-containing compounds Other (See Comments) 04/01/2014  . Myobloc [rimabotulinumtoxinb] Other (See Comments) 06/07/2012  . Neomycin Other (See Comments) 06/07/2012  . Noroxin [norfloxacin] Other (See Comments) 06/07/2012  . Other Other (See Comments) 06/07/2012  . Penicillamine Other (See Comments) 06/07/2012  . Procainamide Other (See Comments) 06/07/2012  . Propranolol Other (See Comments) 06/07/2012  . Quinidine Other (See Comments) 06/07/2012  . Quinine derivatives Other (See Comments) 06/07/2012  . Streptomycin Other (See Comments) 06/07/2012  . Timolol Other (See Comments) 06/07/2012  . Tobramycin Other (See Comments) 06/07/2012  . Xeomin [incobotulinumtoxina] Other (See Comments) 06/07/2012  . Zithromax [azithromycin] Other (See Comments) 06/07/2012    Past Medical History:  Diagnosis Date  . Candida esophagitis (HCC) OCT 2014  . Deafness in right ear   . Diabetes mellitus without complication (HCC)   . Difficult intubation   . Esophageal stricture SEP 2011   FOOD IMPACTION-->SAV DIL 16 MM  . GERD (gastroesophageal reflux disease)   . Hypertension   . Kidney stone   . Myasthenia gravis (HCC)   . Sleep apnea   . Thyroid disease     Past Surgical History:  Procedure Laterality Date  . ATRIAL FIBRILLATION ABLATION N/A 08/12/2020   Procedure: ATRIAL FIBRILLATION ABLATION;  Surgeon: Lanier Prude, MD;  Location: [facility] INVASIVE CV LAB;  Service: Cardiovascular;  Laterality: N/A;  . BIOPSY  02/14/2018   Procedure: BIOPSY;  Surgeon: West Bali, MD;  Location: [facility] ENDO SU[facility];  Service: Endoscopy;;  gastric  . CARDIOVERSION N/A 06/08/2020   Procedure: CARDIOVERSION;  Surgeon: Antoine Poche, MD;  Location: [facility];  Service: Endoscopy;  Laterality: N/A;  . CARDIOVERSION N/A 09/16/2020   Procedure: CARDIOVERSION;  Surgeon: Meriam Sprague, MD;  Location: [facility] ENDOSCOPY;  Service:  Cardiovascular;  Laterality: N/A;  . CHOLECYSTECTOMY    . COLONOSCOPY  2005   Dr. Lovell Sheehan  . COLONOSCOPY N/A 07/17/2014   Procedure: COLONOSCOPY;  Surgeon: West Bali, MD;  Location: [facility];  Service: Endoscopy;  Laterality: N/A;  9:30-rescheduled 8/28 same time Soledad Gerlach to notify pt  . ESOPHAGOGASTRODUODENOSCOPY N/A 07/25/2013   Shallow ulcer in the cardia, mid esophageal web, stricture at GE junction  . ESOPHAGOGASTRODUODENOSCOPY N/A 02/14/2018   Procedure: ESOPHAGOGASTRODUODENOSCOPY (EGD);  Surgeon: West Bali, MD;  Location: [facility];  Service: Endoscopy;  Laterality: N/A;  11:15am  . ESOPHAGOGASTRODUODENOSCOPY (EGD) WITH ESOPHAGEAL DILATION N/A 08/29/2013   Candida esophagitis, small hiatal hernia, moderate nonerosive gastritis, mid esophageal web  . HERNIA REPAIR     right inguinal  . KIDNEY STONE SURGERY    . NASAL SEPTUM SURGERY    . PALATE SURGERY        . SAVORY DILATION N/A 02/14/2018   Procedure: SAVORY DILATION;  Surgeon: West Bali, MD;  Location: [facility];  Service: Endoscopy;  Laterality: N/A;  . TEE WITHOUT CARDIOVERSION N/A 08/10/2020   Procedure: TRANSESOPHAGEAL ECHOCARDIOGRAM (TEE);  Surgeon: Chilton Si, MD;  Location: [facility] ENDOSCOPY;  Service: Cardiovascular;  Laterality: N/A;  . THYMECTOMY    . TONSILLECTOMY    .  UPPER GASTROINTESTINAL ENDOSCOPY  SEP 2011   SAV DIL    Family History  Problem Relation Age of Onset  . Liver disease Father        age 68, deceased  . Colon cancer Neg Hx     Social History   Socioeconomic History  . Marital status: Married    Spouse name: Not on file  . Number of children: Not on file  . Years of education: Not on file  . Highest education level: Not on file  Occupational History  . Not on file  Tobacco Use  . Smoking status: Never Smoker  . Smokeless tobacco: Never Used  Vaping Use  . Vaping Use: Never used  Substance and Sexual Activity  . Alcohol use: No  . Drug use: No  . Sexual  activity: Yes    Partners: Female    Birth control/protection: None    Comment: spouse  Other Topics Concern  . Not on file  Social History Narrative  . Not on file   Social Determinants of Health   Financial Resource Strain: Not on file  Food Insecurity: Not on file  Transportation Needs: Not on file  Physical Activity: Not on file  Stress: Not on file  Social Connections: Not on file  Intimate Partner Violence: Not on file    Review of Systems  Constitutional: Negative.   HENT: Negative.   Eyes: Negative.   Respiratory: Positive for cough. Negative for shortness of breath and wheezing.   Cardiovascular: Negative.   Gastrointestinal: Negative.   Psychiatric/Behavioral: Positive for sleep disturbance.   Vitals:   01/27/21 1003  BP: 126/78  Pulse: 87  Temp: 97.9 F (36.6 C)  SpO2: 97%   Physical Exam Constitutional:      Appearance: He is well-developed.  HENT:     Head: Normocephalic and atraumatic.     Mouth/Throat:     Mouth: Mucous membranes are moist.  Eyes:     General:        Right eye: No discharge.        Left eye: No discharge.  Neck:     Thyroid: No thyromegaly.     Trachea: No tracheal deviation.  Cardiovascular:     Rate and Rhythm: Normal rate and regular rhythm.  Pulmonary:     Effort: Pulmonary effort is normal. No respiratory distress.     Breath sounds: Normal breath sounds. No stridor. No wheezing or rhonchi.  Musculoskeletal:     Cervical back: No rigidity or tenderness.  Neurological:     Mental Status: He is alert.  Psychiatric:        Mood and Affect: Mood normal.    Data Reviewed: Chest x-ray reviewed showing bibasal infiltrates  Assessment:  Recent Covid pneumonia -Symptoms continue to improve  Hypoxic respiratory failure -Has been off his oxygen supplementation for few weeks and feeling better  History of myasthenia gravis  He is improving  Plan/Recommendations:  We will discontinue oxygen  supplementation  Follow-up in 6 months  Obtain chest x-ray today  Call with any significant concerns   Virl Diamond MD [facility] 01/27/2021, 10:08 AM  CC: Elfredia Nevins, MD

## 2021-01-27 NOTE — Addendum Note (Signed)
Addended by: Dulcy Fanny on: 01/27/2021 10:25 AM   Modules accepted: Orders

## 2021-01-27 NOTE — Patient Instructions (Signed)
Chest x-ray today  Prescription to go to DME company to pick up oxygen  Follow-up in 6 months  Call with significant concerns

## 2021-03-15 ENCOUNTER — Telehealth: Payer: Self-pay

## 2021-03-15 NOTE — Telephone Encounter (Signed)
**Note De-Identified  Obfuscation** Eliquis PA started through covermymeds and the following message was received:  [name] (Key: [id number]) Eliquis 5MG  tablets Form: [employer] Electronic PA Form (2017 NCPDP) Determination: N/A Message from Plan Drug is covered by current benefit plan. No further PA activity needed  I have notified [facility] of this outcome and was advised by the pharmacist that the cost for Eliquis to the pt is now $0. They are filling and state they will notify the pt today when it is ready for pick up.

## 2021-04-05 ENCOUNTER — Encounter: Payer: Self-pay | Admitting: Urology

## 2021-04-05 ENCOUNTER — Other Ambulatory Visit: Payer: Self-pay

## 2021-04-05 ENCOUNTER — Telehealth (INDEPENDENT_AMBULATORY_CARE_PROVIDER_SITE_OTHER): Payer: Managed Care, Other (non HMO) | Admitting: Urology

## 2021-04-05 DIAGNOSIS — N411 Chronic prostatitis: Secondary | ICD-10-CM | POA: Diagnosis not present

## 2021-04-05 DIAGNOSIS — R351 Nocturia: Secondary | ICD-10-CM

## 2021-04-05 DIAGNOSIS — N401 Enlarged prostate with lower urinary tract symptoms: Secondary | ICD-10-CM | POA: Diagnosis not present

## 2021-04-05 DIAGNOSIS — N5201 Erectile dysfunction due to arterial insufficiency: Secondary | ICD-10-CM

## 2021-04-05 DIAGNOSIS — N138 Other obstructive and reflux uropathy: Secondary | ICD-10-CM

## 2021-04-05 MED ORDER — TAMSULOSIN HCL 0.4 MG PO CAPS: 0.4000 mg | ORAL_CAPSULE | Freq: Two times a day (BID) | ORAL | 3 refills | Status: DC

## 2021-04-05 NOTE — Patient Instructions (Signed)
Prostatitis  Prostatitis is swelling of the prostate gland, also called the prostate. This gland is about 1.5 inches wide and 1 inch high, and it helps to make a fluid called semen. The prostate is below a man's bladder, in front of the butt (rectum). There are different types of prostatitis. What are the causes? One type of prostatitis is caused by an infection from germs (bacteria). Another type is not caused by germs. It may be caused by:  Things having to do with the nervous system. This system includes thebrain, spinal cord, and nerves.  An autoimmune response. This happens when the body's disease-fighting system attacks healthy tissue in the body by mistake.  Psychological factors. These have to do with how the mind works. The causes of other types of prostatitis are normally not known. What are the signs or symptoms? Symptoms of this condition depend on the type of prostatitis you have. If your condition is caused by germs:  You may feel pain or burning when you pee (urinate).  You may pee often and all of a sudden.  You may have problems starting to pee.  You may have trouble emptying your bladder when you pee.  You may have fever or chills.  You may feel pain in your muscles, joints, low back, or lower belly. If you have other types of prostatitis:  You may pee often or all of a sudden.  You may have trouble starting to pee.  You may have a weak flow when you pee.  You may leak pee after using the bathroom.  You may have other problems, such as: ? Abnormal fluid coming from the penis. ? Pain in the testicles or penis. ? Pain between the butt and the testicles. ? Pain when fluid comes out of the penis during sex. How is this treated? Treatment for this condition depends on the type of prostatitis. Treatment may include:  Medicines. These may treat pain or swelling, or they may help relax muscles.  Exercises to help you move better or get stronger (physical  therapy).  Heat therapy.  Techniques to help you control some of the ways that your body works.  Exercises to help you relax.  Antibiotic medicine, if your condition is caused by germs.  Warm water baths (sitz baths) to relax muscles. Follow these instructions at home: Medicines  Take over-the-counter and prescription medicines only as told by your doctor.  If you were prescribed an antibiotic medicine, take it as told by your doctor. Do not stop using the antibiotic even if you start to feel better. Managing pain and swelling  Take sitz baths as told by your doctor. For a sitz bath, sit in warm water that is deep enough to cover your hips and butt.  If told, put heat on the painful area. Do this as often as told by your doctor. Use the heat source that your doctor recommends, such as a moist heat pack or a heating pad. ? Place a towel between your skin and the heat source. ? Leave the heat on for 20-30 minutes. ? Take off the heat if your skin turns bright red. This is very important if you are unable to feel pain, heat, or cold. You may have a greater risk of getting burned.   General instructions  Do exercises as told by your doctor, if your doctor prescribed them.  Keep all follow-up visits as told by your doctor. This is important. Where to find more information  National Institute   of Diabetes and Digestive and Kidney Diseases: https://www.niddk.nih.gov Contact a doctor if:  Your symptoms get worse.  You have a fever. Get help right away if:  You have chills.  You feel light-headed.  You feel like you may faint.  You cannot pee.  You have blood or clumps of blood (blood clots) in your pee. Summary  Prostatitis is swelling of the prostate gland.  There are different types of prostatitis. Treatment depends on the type that you have.  Take over-the-counter and prescription medicines only as told by your doctor.  Get help right away of you have chills, feel  light-headed, or feel like you may faint. Also get help right away if you cannot pee or you have blood or clumps of blood in your pee. This information is not intended to replace advice given to you by your health care provider. Make sure you discuss any questions you have with your health care provider. Document Revised: 12/12/2019 Document Reviewed: 12/12/2019 Elsevier Patient Education  2021 [employer]vier Inc.  

## 2021-04-05 NOTE — Progress Notes (Signed)
04/05/2021 3:53 PM   [name] 03-Apr-1951 [id number]  Referring provider: Elfredia Nevins, MD [address] [place],  [place] [address]   Patient location: home Physician location: office I connected with  [name] on 04/05/21 by a video enabled telemedicine application and verified that I am speaking with the correct person using two identifiers.   I discussed the limitations of evaluation and management by telemedicine. The patient expressed understanding and agreed to proceed.    followup BPH and Erectile dysfunction  HPI: [name] is a 69yo here for followup for BPH and erectile dysfunction. He has very mild LUTS on flomax BID. Nocturia 0x. No UTIS or prostatitis flares since last visit. He tadalafil 20mg  prn for his erectile dysfunction which works well. No other complaints today.  He was admitted in Feb 2022 with COVID and was on home O2 for 3 weeks which he has since stopped.     PMH: Past Medical History:  Diagnosis Date  . Candida esophagitis (HCC) OCT 2014  . Deafness in right ear   . Diabetes mellitus without complication (HCC)   . Difficult intubation   . Esophageal stricture SEP 2011   FOOD IMPACTION-->SAV DIL 16 MM  . GERD (gastroesophageal reflux disease)   . Hypertension   . Kidney stone   . Myasthenia gravis (HCC)   . Sleep apnea   . Thyroid disease     Surgical History: Past Surgical History:  Procedure Laterality Date  . ATRIAL FIBRILLATION ABLATION N/A 08/12/2020   Procedure: ATRIAL FIBRILLATION ABLATION;  Surgeon: 08/14/2020, MD;  Location: [facility] INVASIVE CV LAB;  Service: Cardiovascular;  Laterality: N/A;  . BIOPSY  02/14/2018   Procedure: BIOPSY;  Surgeon: 02/16/2018, MD;  Location: [facility];  Service: Endoscopy;;  gastric  . CARDIOVERSION N/A 06/08/2020   Procedure: CARDIOVERSION;  Surgeon: 06/10/2020, MD;  Location: [facility];  Service: Endoscopy;  Laterality: N/A;  . CARDIOVERSION N/A 09/16/2020    Procedure: CARDIOVERSION;  Surgeon: 09/18/2020, MD;  Location: [facility] ENDOSCOPY;  Service: Cardiovascular;  Laterality: N/A;  . CHOLECYSTECTOMY    . COLONOSCOPY  2005   Dr. 2006  . COLONOSCOPY N/A 07/17/2014   Procedure: COLONOSCOPY;  Surgeon: 07/19/2014, MD;  Location: [facility];  Service: Endoscopy;  Laterality: N/A;  9:30-rescheduled 8/28 same time 9/28 to notify pt  . ESOPHAGOGASTRODUODENOSCOPY N/A 07/25/2013   Shallow ulcer in the cardia, mid esophageal web, stricture at GE junction  . ESOPHAGOGASTRODUODENOSCOPY N/A 02/14/2018   Procedure: ESOPHAGOGASTRODUODENOSCOPY (EGD);  Surgeon: 02/16/2018, MD;  Location: [facility];  Service: Endoscopy;  Laterality: N/A;  11:15am  . ESOPHAGOGASTRODUODENOSCOPY (EGD) WITH ESOPHAGEAL DILATION N/A 08/29/2013   Candida esophagitis, small hiatal hernia, moderate nonerosive gastritis, mid esophageal web  . HERNIA REPAIR     right inguinal  . KIDNEY STONE SURGERY    . NASAL SEPTUM SURGERY    . PALATE SURGERY        . SAVORY DILATION N/A 02/14/2018   Procedure: SAVORY DILATION;  Surgeon: 02/16/2018, MD;  Location: [facility];  Service: Endoscopy;  Laterality: N/A;  . TEE WITHOUT CARDIOVERSION N/A 08/10/2020   Procedure: TRANSESOPHAGEAL ECHOCARDIOGRAM (TEE);  Surgeon: 08/12/2020, MD;  Location: [facility] ENDOSCOPY;  Service: Cardiovascular;  Laterality: N/A;  . THYMECTOMY    . TONSILLECTOMY    . UPPER GASTROINTESTINAL ENDOSCOPY  SEP 2011   SAV DIL    Home Medications:  Allergies as of  04/05/2021      Reactions   Levofloxacin Anaphylaxis   Tequin [gatifloxacin] Anaphylaxis   Aminoglycosides Other (See Comments)   REACTION: Myasthenia Gravis (MG) Medication Alert   Avelox [moxifloxacin Hcl In Nacl] Other (See Comments)   REACTION: FLUOROQUINOLONE ANTIBIOTICS: Myasthenia Gravis (MG) Medication Alert   Botox [onabotulinumtoxina] Other (See Comments)   REACTION: Myasthenia Gravis (MG) Medication Alert   Botulinum  Toxins Other (See Comments)   REACTION: Myasthenia Gravis (MG) Medication Alert   Calcium Channel Blockers Other (See Comments)   (BLOOD PRESSURE MEDICATION) REACTION: Myasthenia Gravis (MG) Medication Alert   Ciprofloxacin Other (See Comments)   REACTION: FLUOROQUINOLONE ANTIBIOTICS: Myasthenia Gravis (MG) Medication Alert   Contrast Media [iodinated Diagnostic Agents] Other (See Comments)   MD told pt cannot have   Curare [tubocurarine] Other (See Comments)   (Usually only used during surgery)  REACTION: Myasthenia Gravis (MG) Medication Alert   Dysport [abobotulinumtoxina] Other (See Comments)   REACTION: Myasthenia Gravis (MG) Medication Alert   Erythromycin Other (See Comments)   REACTION: Myasthenia Gravis (MG) Medication Alert   Factive [gemifloxacin] Other (See Comments)   REACTION: FLUOROQUINOLONE ANTIBIOTICS: Myasthenia Gravis (MG) Medication Alert   Floxin [ofloxacin] Other (See Comments)   REACTION: FLUOROQUINOLONE ANTIBIOTICS: Myasthenia Gravis (MG) Medication Alert   Gentamycin [gentamicin] Other (See Comments)   REACTION:Myasthenia Gravis (MG) Medication Alert   Interferons Other (See Comments)   REACTION: Myasthenia Gravis (MG) Medication Alert   Kanamycin Other (See Comments)   REACTION: Myasthenia Gravis (MG) Medication Alert   Ketek [telithromycin] Other (See Comments)   REACTION: Myasthenia Gravis (MG) Medication Alert   Levaquin [levofloxacin In D5w] Other (See Comments)   REACTION: FLUOROQUINOLONE ANTIBIOTICS: Myasthenia Gravis (MG) Medication Alert   Limonene Other (See Comments)   REACTION:Myasthenia Gravis (MG) Medication Alert   Macrolides And Ketolides Other (See Comments)   REACTION: Myasthenia Gravis (MG) Medication Alert REACTION: Myasthenia Gravis (MG) Medication Alert   Magnesium-containing Compounds Other (See Comments)   Unknown   Myobloc [rimabotulinumtoxinb] Other (See Comments)   REACTION: Myasthenia Gravis (MG) Medication Alert   Neomycin  Other (See Comments)   REACTION: Myasthenia Gravis (MG) Medication Alert   Noroxin [norfloxacin] Other (See Comments)   REACTION: FLUOROQUINOLONE ANTIBIOTICS: Myasthenia Gravis (MG) Medication Alert   Other Other (See Comments)   MAGNESIUM SALTS: Milk of Magnesia, some antacids (Maalox, Mylanta) REACTION: Myasthenia Gravis (MG) Medication Alerta   Penicillamine Other (See Comments)   D-PENICILLAMINE *DO NOT CONFUSE WITH PENICILLIN*  REACTION: Myasthenia Gravis (MG) Medication Alert   Procainamide Other (See Comments)   REACTION: Myasthenia Gravis (MG) Medication Alert   Propranolol Other (See Comments)   REACTION: Myasthenia Gravis (MG) Medication Alert   Quinidine Other (See Comments)   REACTION: Myasthenia Gravis (MG) Medication Alert   Quinine Derivatives Other (See Comments)   REACTION: Myasthenia Gravis (MG) Medication Alert   Streptomycin Other (See Comments)   REACTION:  Myasthenia Gravis (MG) Medication Alert   Timolol Other (See Comments)   REACTION: Myasthenia Gravis (MG) Medication Alert   Tobramycin Other (See Comments)   REACTION: Myasthenia Gravis (MG) Medication Alert   Xeomin [incobotulinumtoxina] Other (See Comments)   REACTION: Myasthenia Gravis (MG) Medication Alert   Zithromax [azithromycin] Other (See Comments)   REACTION: Myasthenia Gravis (MG) Medication Alert      Medication List       Accurate as of Apr 05, 2021  3:53 PM. If you have any questions, ask your nurse or doctor.        acyclovir 400  MG tablet Commonly known as: ZOVIRAX Take 800 mg by mouth daily.   apixaban 5 MG Tabs tablet Commonly known as: Eliquis Take 1 tablet (5 mg total) by mouth 2 (two) times daily.   azaTHIOprine 50 MG tablet Commonly known as: IMURAN Take 150 mg by mouth daily.   budesonide 0.5 MG/2ML nebulizer solution Commonly known as: PULMICORT Take 0.5 mg by nebulization every 6 (six) hours as needed (SOB/wheezing).   Calcium 600+D 600-200 MG-UNIT Tabs Generic  drug: Calcium Carbonate-Vitamin D Take 2 tablets by mouth daily after lunch.   diltiazem 120 MG 24 hr capsule Commonly known as: CARDIZEM CD Take 1 capsule (120 mg total) by mouth daily.   Fish Oil Ultra 1400 MG Caps Take 1,400 mg by mouth daily after lunch.   losartan 50 MG tablet Commonly known as: COZAAR Take 1 tablet (50 mg total) by mouth daily.   metFORMIN 500 MG 24 hr tablet Commonly known as: GLUCOPHAGE-XR Take 1,000 mg by mouth 2 (two) times daily.   omeprazole 20 MG capsule Commonly known as: PRILOSEC TAKE (1) CAPSULE BY MOUTH TWICE DAILY.   OneTouch Verio test strip Generic drug: glucose blood 1 each 3 (three) times daily.   Saw Palmetto 450 MG Caps Take 2,250 mg by mouth daily after lunch.   Synthroid 137 MCG tablet Generic drug: levothyroxine Take 137 mcg by mouth daily before breakfast.   tadalafil 20 MG tablet Commonly known as: CIALIS TAKE 1 TABLET BY MOUTH AS NEEDED   tamsulosin 0.4 MG Caps capsule Commonly known as: FLOMAX TAKE 1 CAPSULE BY MOUTH TWICE DAILY   vitamin C 1000 MG tablet Take 2,000 mg by mouth daily after lunch.   vitamin E 180 MG (400 UNITS) capsule Take 800 Units by mouth daily after lunch.   zinc sulfate 220 (50 Zn) MG capsule Take 1 capsule (220 mg total) by mouth daily.       Allergies:  Allergies  Allergen Reactions  . Levofloxacin Anaphylaxis  . Tequin [Gatifloxacin] Anaphylaxis  . Aminoglycosides Other (See Comments)    REACTION: Myasthenia Gravis (MG) Medication Alert  . Avelox [Moxifloxacin Hcl In Nacl] Other (See Comments)    REACTION: FLUOROQUINOLONE ANTIBIOTICS: Myasthenia Gravis (MG) Medication Alert  . Botox [Onabotulinumtoxina] Other (See Comments)    REACTION: Myasthenia Gravis (MG) Medication Alert  . Botulinum Toxins Other (See Comments)    REACTION: Myasthenia Gravis (MG) Medication Alert  . Calcium Channel Blockers Other (See Comments)    (BLOOD PRESSURE MEDICATION) REACTION: Myasthenia Gravis  (MG) Medication Alert  . Ciprofloxacin Other (See Comments)    REACTION: FLUOROQUINOLONE ANTIBIOTICS: Myasthenia Gravis (MG) Medication Alert  . Contrast Media [Iodinated Diagnostic Agents] Other (See Comments)    MD told pt cannot have  . Curare [Tubocurarine] Other (See Comments)    (Usually only used during surgery)  REACTION: Myasthenia Gravis (MG) Medication Alert  . Dysport [Abobotulinumtoxina] Other (See Comments)    REACTION: Myasthenia Gravis (MG) Medication Alert  . Erythromycin Other (See Comments)    REACTION: Myasthenia Gravis (MG) Medication Alert  . Factive [Gemifloxacin] Other (See Comments)    REACTION: FLUOROQUINOLONE ANTIBIOTICS: Myasthenia Gravis (MG) Medication Alert  . Floxin [Ofloxacin] Other (See Comments)    REACTION: FLUOROQUINOLONE ANTIBIOTICS: Myasthenia Gravis (MG) Medication Alert  . Gentamycin [Gentamicin] Other (See Comments)    REACTION:Myasthenia Gravis (MG) Medication Alert  . Interferons Other (See Comments)    REACTION: Myasthenia Gravis (MG) Medication Alert  . Kanamycin Other (See Comments)    REACTION: Myasthenia Gravis (MG)  Medication Alert  . Ketek [Telithromycin] Other (See Comments)    REACTION: Myasthenia Gravis (MG) Medication Alert  . Levaquin [Levofloxacin In D5w] Other (See Comments)    REACTION: FLUOROQUINOLONE ANTIBIOTICS: Myasthenia Gravis (MG) Medication Alert  . Limonene Other (See Comments)    REACTION:Myasthenia Gravis (MG) Medication Alert  . Macrolides And Ketolides Other (See Comments)    REACTION: Myasthenia Gravis (MG) Medication Alert REACTION: Myasthenia Gravis (MG) Medication Alert  . Magnesium-Containing Compounds Other (See Comments)    Unknown  . Myobloc [Rimabotulinumtoxinb] Other (See Comments)    REACTION: Myasthenia Gravis (MG) Medication Alert  . Neomycin Other (See Comments)    REACTION: Myasthenia Gravis (MG) Medication Alert  . Noroxin [Norfloxacin] Other (See Comments)    REACTION: FLUOROQUINOLONE  ANTIBIOTICS: Myasthenia Gravis (MG) Medication Alert  . Other Other (See Comments)    MAGNESIUM SALTS: Milk of Magnesia, some antacids (Maalox, Mylanta) REACTION: Myasthenia Gravis (MG) Medication Alerta  . Penicillamine Other (See Comments)    D-PENICILLAMINE *DO NOT CONFUSE WITH PENICILLIN*  REACTION: Myasthenia Gravis (MG) Medication Alert  . Procainamide Other (See Comments)    REACTION: Myasthenia Gravis (MG) Medication Alert  . Propranolol Other (See Comments)    REACTION: Myasthenia Gravis (MG) Medication Alert  . Quinidine Other (See Comments)    REACTION: Myasthenia Gravis (MG) Medication Alert  . Quinine Derivatives Other (See Comments)    REACTION: Myasthenia Gravis (MG) Medication Alert  . Streptomycin Other (See Comments)    REACTION:  Myasthenia Gravis (MG) Medication Alert  . Timolol Other (See Comments)    REACTION: Myasthenia Gravis (MG) Medication Alert  . Tobramycin Other (See Comments)    REACTION: Myasthenia Gravis (MG) Medication Alert  . Xeomin [Incobotulinumtoxina] Other (See Comments)    REACTION: Myasthenia Gravis (MG) Medication Alert  . Zithromax [Azithromycin] Other (See Comments)    REACTION: Myasthenia Gravis (MG) Medication Alert    Family History: Family History  Problem Relation Age of Onset  . Liver disease Father        age 76, deceased  . Colon cancer Neg Hx     Social History:  reports that he has never smoked. He has never used smokeless tobacco. He reports that he does not drink alcohol and does not use drugs.  ROS: All other review of systems were reviewed and are negative except what is noted above in HPI  Physical Exam: There were no vitals taken for this visit.  Constitutional:  Alert and oriented, No acute distress. HEENT: [place] AT, moist mucus membranes.  Trachea midline, no masses. Cardiovascular: No clubbing, cyanosis, or edema. Respiratory: Normal respiratory effort, no increased work of breathing. GI: Abdomen is soft,  nontender, nondistended, no abdominal masses GU: No CVA tenderness.  Lymph: No cervical or inguinal lymphadenopathy. Skin: No rashes, bruises or suspicious lesions. Neurologic: Grossly intact, no focal deficits, moving all 4 extremities. Psychiatric: Normal mood and affect.  Laboratory Data: Lab Results  Component Value Date   WBC 16.7 (H) 12/25/2020   HGB 13.3 12/25/2020   HCT 35.4 (L) 12/25/2020   MCV 96.5 12/25/2020   PLT 274 12/25/2020    Lab Results  Component Value Date   CREATININE 1.04 12/25/2020    No results found for: PSA  No results found for: TESTOSTERONE  Lab Results  Component Value Date   HGBA1C 7.1 (H) 06/07/2020    Urinalysis    Component Value Date/Time   COLORURINE AMBER (A) 09/14/2018 1251   APPEARANCEUR Clear 10/18/2020 0920   LABSPEC 1.027 09/14/2018  1251   PHURINE 6.0 09/14/2018 1251   GLUCOSEU Trace (A) 10/18/2020 0920   HGBUR NEGATIVE 09/14/2018 1251   BILIRUBINUR Negative 10/18/2020 0920   KETONESUR 20 (A) 09/14/2018 1251   PROTEINUR Trace (A) 10/18/2020 0920   PROTEINUR 30 (A) 09/14/2018 1251   UROBILINOGEN 0.2 04/01/2014 2010   NITRITE Negative 10/18/2020 0920   NITRITE NEGATIVE 09/14/2018 1251   LEUKOCYTESUR Negative 10/18/2020 0920    Lab Results  Component Value Date   LABMICR Comment 10/18/2020   BACTERIA NONE SEEN 09/14/2018    Pertinent Imaging:  Results for orders placed during the hospital encounter of 07/16/09  DG Abd 1 View  Narrative Clinical Data: Left ureteral calculus.  ABDOMEN - 1 VIEW 07/16/2009:  Comparison: One-view abdomen x-ray 07/14/2009 and 06/30/2009.  CT abdomen and pelvis 05/19/2009.  Findings: Stable appearance to the 2 adjacent calculi in the distal left ureter.  These are adjacent to and phleboliths in the lower left pelvis as noted on the prior CT.  Phleboliths are also present on the right side of the pelvis.  No visible upper urinary tract calculi on either side.  Bowel gas pattern  unremarkable. Degenerative changes again noted throughout the lumbar spine.  IMPRESSION: Stable appearance of the 2 adjacent distal left ureteral calculi.  Provider: Samule OhmKim Marley  No results found for this or any previous visit.  No results found for this or any previous visit.  No results found for this or any previous visit.  No results found for this or any previous visit.  No results found for this or any previous visit.  No results found for this or any previous visit.  Results for orders placed during the hospital encounter of 06/01/17  CT Renal Stone Study  Narrative CLINICAL DATA:  Left flank pain  EXAM: CT ABDOMEN AND PELVIS WITHOUT CONTRAST  TECHNIQUE: Multidetector CT imaging of the abdomen and pelvis was performed following the standard protocol without IV contrast.  COMPARISON:  06/10/2014  FINDINGS: Lower chest: Lung bases are essentially clear.  Hepatobiliary: Mild hepatic steatosis.  Status post cholecystectomy. No intrahepatic or extrahepatic duct dilatation.  Pancreas: Within normal limits.  Spleen: Within normal limits. 15 mm splenule in the left upper abdomen.  Adrenals/Urinary Tract: Adrenal glands are within normal limits.  Kidneys are within normal limits. 2 mm nonobstructing right lower pole renal calculus (series 2/ image 48). 3 mm nonobstructing left lower pole renal calculus (series 2/image 52).  No ureteral or bladder calculi.  No hydronephrosis.  Bladder is within normal limits.  Stomach/Bowel: Stomach is within normal limits.  No evidence of bowel obstruction.  Normal appendix (series 2/image 63).  Left colonic diverticulosis, without evidence of diverticulitis.  Vascular/Lymphatic: No evidence of abdominal aortic aneurysm.  No suspicious abdominopelvic lymphadenopathy.  Reproductive: Prostate is unremarkable.  Other: No abdominopelvic ascites.  Small fat containing left inguinal hernia (series 2/image  93).  Musculoskeletal: Degenerative changes of the visualized thoracolumbar spine.  IMPRESSION: 2 mm nonobstructing right lower pole renal calculus. 3 mm nonobstructing left lower pole renal calculus. No ureteral or bladder calculi. No hydronephrosis.  Sigmoid diverticulosis, without evidence of diverticulitis.  Mild hepatic steatosis.   Electronically Signed By: Charline BillsSriyesh  Krishnan M.D. On: 06/01/2017 15:33   Assessment & Plan:    1. Benign prostatic hyperplasia with urinary obstruction -continue flomax BID  2. 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 Nocturia -Continue flomax BID  4. Erectile dysfunction due to arterial insufficiency -tadalafil 20mg  prn   No follow-ups on  file.  Wilkie Aye, MD  [facility] [place]

## 2021-04-06 ENCOUNTER — Ambulatory Visit (HOSPITAL_COMMUNITY): Payer: Managed Care, Other (non HMO) | Attending: Internal Medicine

## 2021-04-06 ENCOUNTER — Other Ambulatory Visit: Payer: Self-pay

## 2021-04-06 DIAGNOSIS — I4819 Other persistent atrial fibrillation: Secondary | ICD-10-CM | POA: Insufficient documentation

## 2021-04-06 DIAGNOSIS — I340 Nonrheumatic mitral (valve) insufficiency: Secondary | ICD-10-CM | POA: Insufficient documentation

## 2021-04-06 DIAGNOSIS — I10 Essential (primary) hypertension: Secondary | ICD-10-CM | POA: Diagnosis not present

## 2021-04-06 DIAGNOSIS — I517 Cardiomegaly: Secondary | ICD-10-CM | POA: Diagnosis not present

## 2021-04-06 DIAGNOSIS — I081 Rheumatic disorders of both mitral and tricuspid valves: Secondary | ICD-10-CM | POA: Insufficient documentation

## 2021-04-06 DIAGNOSIS — G4730 Sleep apnea, unspecified: Secondary | ICD-10-CM | POA: Diagnosis not present

## 2021-04-06 DIAGNOSIS — E119 Type 2 diabetes mellitus without complications: Secondary | ICD-10-CM | POA: Insufficient documentation

## 2021-04-06 DIAGNOSIS — I429 Cardiomyopathy, unspecified: Secondary | ICD-10-CM | POA: Diagnosis not present

## 2021-04-06 DIAGNOSIS — Z8616 Personal history of COVID-19: Secondary | ICD-10-CM | POA: Insufficient documentation

## 2021-04-06 LAB — ECHOCARDIOGRAM COMPLETE
Area-P 1/2: 2.4 cm2
S' Lateral: 3.5 cm

## 2021-04-13 ENCOUNTER — Ambulatory Visit: Payer: Managed Care, Other (non HMO) | Admitting: Urology

## 2021-05-12 NOTE — Progress Notes (Signed)
Electrophysiology Office Follow up Visit Note:    Date:  05/13/2021   ID:  [name], DOB [birth year], MRN [id number]  PCP:  Assunta Found, MD  [facility] Cardiologist:  Prentice Docker, MD (Inactive)  [facility] Electrophysiologist:  Lanier Prude, MD    Interval History:    [name] is a 70 y.o. male who presents for a follow up visit.   I last saw the patient November 09, 2020 for persistent atrial fibrillation.  He underwent a successful persistent atrial fibrillation ablation on August 12, 2020.  During the ablation the pulmonary veins were isolated and the posterior wall was isolated.  He did have a cardioversion on September 16, 2020 after the procedure and at the time of our last appointment in December he was maintaining sinus rhythm.  He saw her Maryella Shivers on January 11, 2021.  At that appointment he reported no atrial fibrillation.  He was feeling well.  He did have COVID but is recovered well.  No atrial fibrillation during his COVID-pneumonia.      Past Medical History:  Diagnosis Date   Candida esophagitis (HCC) OCT 2014   Deafness in right ear    Diabetes mellitus without complication (HCC)    Difficult intubation    Esophageal stricture SEP 2011   FOOD IMPACTION-->SAV DIL 16 MM   GERD (gastroesophageal reflux disease)    Hypertension    Kidney stone    Myasthenia gravis (HCC)    Sleep apnea    Thyroid disease     Past Surgical History:  Procedure Laterality Date   ATRIAL FIBRILLATION ABLATION N/A 08/12/2020   Procedure: ATRIAL FIBRILLATION ABLATION;  Surgeon: Lanier Prude, MD;  Location: [facility] INVASIVE CV LAB;  Service: Cardiovascular;  Laterality: N/A;   BIOPSY  02/14/2018   Procedure: BIOPSY;  Surgeon: West Bali, MD;  Location: [facility] ENDO SU[facility];  Service: Endoscopy;;  gastric   CARDIOVERSION N/A 06/08/2020   Procedure: CARDIOVERSION;  Surgeon: Antoine Poche, MD;  Location: [facility] END[facility];  Service: Endoscopy;   Laterality: N/A;   CARDIOVERSION N/A 09/16/2020   Procedure: CARDIOVERSION;  Surgeon: Meriam Sprague, MD;  Location: [facility] ENDOSCOPY;  Service: Cardiovascular;  Laterality: N/A;   CHOLECYSTECTOMY     COLONOSCOPY  2005   Dr. Lovell Sheehan   COLONOSCOPY N/A 07/17/2014   Procedure: COLONOSCOPY;  Surgeon: West Bali, MD;  Location: [facility];  Service: Endoscopy;  Laterality: N/A;  9:30-rescheduled 8/28 same time Soledad Gerlach to notify pt   ESOPHAGOGASTRODUODENOSCOPY N/A 07/25/2013   Shallow ulcer in the cardia, mid esophageal web, stricture at GE junction   ESOPHAGOGASTRODUODENOSCOPY N/A 02/14/2018   Procedure: ESOPHAGOGASTRODUODENOSCOPY (EGD);  Surgeon: West Bali, MD;  Location: [facility];  Service: Endoscopy;  Laterality: N/A;  11:15am   ESOPHAGOGASTRODUODENOSCOPY (EGD) WITH ESOPHAGEAL DILATION N/A 08/29/2013   Candida esophagitis, small hiatal hernia, moderate nonerosive gastritis, mid esophageal web   HERNIA REPAIR     right inguinal   KIDNEY STONE SURGERY     NASAL SEPTUM SURGERY     PALATE SURGERY         SAVORY DILATION N/A 02/14/2018   Procedure: SAVORY DILATION;  Surgeon: West Bali, MD;  Location: [facility];  Service: Endoscopy;  Laterality: N/A;   TEE WITHOUT CARDIOVERSION N/A 08/10/2020   Procedure: TRANSESOPHAGEAL ECHOCARDIOGRAM (TEE);  Surgeon: Chilton Si, MD;  Location: [facility] ENDOSCOPY;  Service: Cardiovascular;  Laterality: N/A;   THYMECTOMY     TONSILLECTOMY  UPPER GASTROINTESTINAL ENDOSCOPY  SEP 2011   SAV DIL    Current Medications: Current Meds  Medication Sig   acyclovir (ZOVIRAX) 400 MG tablet Take 800 mg by mouth daily.    apixaban (ELIQUIS) 5 MG TABS tablet Take 1 tablet (5 mg total) by mouth 2 (two) times daily.   Ascorbic Acid (VITAMIN C) 1000 MG tablet Take 2,000 mg by mouth daily after lunch.    azaTHIOprine (IMURAN) 50 MG tablet Take 150 mg by mouth daily.    budesonide (PULMICORT) 0.5 MG/2ML nebulizer solution Take 0.5 mg by  nebulization every 6 (six) hours as needed (SOB/wheezing).   Calcium Carbonate-Vitamin D (CALCIUM 600+D) 600-200 MG-UNIT TABS Take 2 tablets by mouth daily after lunch.    Cholecalciferol (VITAMIN D) 50 MCG (2000 UT) tablet Take 1 tablet by mouth daily.   diltiazem (CARDIZEM CD) 120 MG 24 hr capsule Take 1 capsule (120 mg total) by mouth daily.   glimepiride (AMARYL) 1 MG tablet Take 1 tablet by mouth daily.   losartan (COZAAR) 50 MG tablet Take 1 tablet (50 mg total) by mouth daily.   metFORMIN (GLUCOPHAGE-XR) 500 MG 24 hr tablet Take 1,000 mg by mouth 2 (two) times daily.   Omega-3 Fatty Acids (FISH OIL ULTRA) 1400 MG CAPS Take 1,400 mg by mouth daily after lunch.   omeprazole (PRILOSEC) 20 MG capsule TAKE (1) CAPSULE BY MOUTH TWICE DAILY.   ONETOUCH VERIO test strip 1 each 3 (three) times daily.   Saw Palmetto 450 MG CAPS Take 2,250 mg by mouth daily after lunch.   SYNTHROID 137 MCG tablet Take 137 mcg by mouth daily before breakfast.   tadalafil (CIALIS) 20 MG tablet TAKE 1 TABLET BY MOUTH AS NEEDED   tamsulosin (FLOMAX) 0.4 MG CAPS capsule Take 1 capsule (0.4 mg total) by mouth 2 (two) times daily.   vitamin E 180 MG (400 UNITS) capsule Take 800 Units by mouth daily after lunch.   zinc sulfate 220 (50 Zn) MG capsule Take 1 capsule (220 mg total) by mouth daily.     Allergies:   Levofloxacin, Tequin [gatifloxacin], Aminoglycosides, Avelox [moxifloxacin hcl in nacl], Botox [onabotulinumtoxina], Botulinum toxins, Calcium channel blockers, Ciprofloxacin, Contrast media [iodinated diagnostic agents], Curare [tubocurarine], Dysport [abobotulinumtoxina], Erythromycin, Factive [gemifloxacin], Floxin [ofloxacin], Gentamycin [gentamicin], Interferons, Kanamycin, Ketek [telithromycin], Levaquin [levofloxacin in d5w], Limonene, Macrolides and ketolides, Magnesium-containing compounds, Myobloc [rimabotulinumtoxinb], Neomycin, Noroxin [norfloxacin], Other, Penicillamine, Procainamide, Propranolol,  Quinidine, Quinine derivatives, Streptomycin, Timolol, Tobramycin, Xeomin [incobotulinumtoxina], and Zithromax [azithromycin]   Social History   Socioeconomic History   Marital status: Married    Spouse name: Not on file   Number of children: Not on file   Years of education: Not on file   Highest education level: Not on file  Occupational History   Not on file  Tobacco Use   Smoking status: Never   Smokeless tobacco: Never  Vaping Use   Vaping Use: Never used  Substance and Sexual Activity   Alcohol use: No   Drug use: No   Sexual activity: Yes    Partners: Female    Birth control/protection: None    Comment: spouse  Other Topics Concern   Not on file  Social History Narrative   Not on file   Social Determinants of Health   Financial Resource Strain: Not on file  Food Insecurity: Not on file  Transportation Needs: Not on file  Physical Activity: Not on file  Stress: Not on file  Social Connections: Not on file  Family History: The patient's family history includes Liver disease in his father. There is no history of Colon cancer.  ROS:   Please see the history of present illness.    All other systems reviewed and are negative.  EKGs/Labs/Other Studies Reviewed:    The following studies were reviewed today:  May 11, 2020 echo Left ventricular function mildly reduced, 45%  Apr 06, 2021 echo personally reviewed Left ventricular function normal, 55% Right ventricular function normal No significant valvular abnormalities  EKG:  The ekg ordered today demonstrates sinus rhythm.  Recent Labs: 12/22/2020: TSH 2.201 12/25/2020: ALT 46; BUN 30; Creatinine, Ser 1.04; Hemoglobin 13.3; Magnesium 1.8; Platelets 274; Potassium 4.1; Sodium 136  Recent Lipid Panel    Component Value Date/Time   TRIG 101 12/21/2020 0951    Physical Exam:    VS:  BP 138/76   Pulse 73   Ht 6\' 1"  (1.854 m)   Wt 213 lb 3.2 oz (96.7 kg)   SpO2 98%   BMI 28.13 kg/m     Wt  Readings from Last 3 Encounters:  05/13/21 213 lb 3.2 oz (96.7 kg)  01/27/21 211 lb (95.7 kg)  01/11/21 206 lb (93.4 kg)     GEN:  Well nourished, well developed in no acute distress HEENT: Normal NECK: No JVD; No carotid bruits LYMPHATICS: No lymphadenopathy CARDIAC: RRR, no murmurs, rubs, gallops RESPIRATORY:  Clear to auscultation without rales, wheezing or rhonchi  ABDOMEN: Soft, non-tender, non-distended MUSCULOSKELETAL:  No edema; No deformity  SKIN: Warm and dry NEUROLOGIC:  Alert and oriented x 3 PSYCHIATRIC:  Normal affect   ASSESSMENT:    1. Persistent atrial fibrillation (HCC)   2. NICM (nonischemic cardiomyopathy) (HCC)   3. Primary hypertension    PLAN:    In order of problems listed above:  1. Persistent atrial fibrillation (HCC) Maintaining sinus rhythm.  He is pleased with the result.  For now we will continue the Eliquis for stroke prophylaxis.  He can discontinue the diltiazem and keep a check on his blood pressures moving forward to make sure they do not creep up.  He did asked me today about the possibility of him getting a chiropractor to adjust his back.  I told him it would be reasonable given interval since his ablation to stop his blood thinner for 2 to 3 days prior to an adjustment and restart it afterwards.  His chiropractor can reach out to our office for further instruction if he wants.   2. NICM (nonischemic cardiomyopathy) (HCC) NYHA class I.  Warm and dry.  EF has normalized since A. fib ablation.   3. Primary hypertension In goal range. Continue to monitor as we stopped diltiazem.   Follow-up 9 months    Total time spent with patient today 45 minutes. This includes reviewing records, evaluating the patient and coordinating care.   Medication Adjustments/Labs and Tests Ordered: Current medicines are reviewed at length with the patient today.  Concerns regarding medicines are outlined above.  Orders Placed This Encounter  Procedures    EKG 12-Lead    No orders of the defined types were placed in this encounter.    Signed, 01/13/21, MD, [facility], [facility] 05/13/2021 9:05 AM    Electrophysiology [facility]

## 2021-05-13 ENCOUNTER — Other Ambulatory Visit: Payer: Self-pay

## 2021-05-13 ENCOUNTER — Ambulatory Visit (INDEPENDENT_AMBULATORY_CARE_PROVIDER_SITE_OTHER): Payer: Managed Care, Other (non HMO) | Admitting: Cardiology

## 2021-05-13 VITALS — BP 138/76 | HR 73 | Ht 73.0 in | Wt 213.2 lb

## 2021-05-13 DIAGNOSIS — I428 Other cardiomyopathies: Secondary | ICD-10-CM

## 2021-05-13 DIAGNOSIS — I10 Essential (primary) hypertension: Secondary | ICD-10-CM

## 2021-05-13 DIAGNOSIS — I4819 Other persistent atrial fibrillation: Secondary | ICD-10-CM

## 2021-05-13 NOTE — Patient Instructions (Addendum)
Medication Instructions:  Your physician recommends that you continue on your current medications as directed. Please refer to the Current Medication list given to you today. *If you need a refill on your cardiac medications before your next appointment, please call your pharmacy*  Lab Work: None ordered. If you have labs (blood work) drawn today and your tests are completely normal, you will receive your results only by: MyChart Message (if you have MyChart) OR A paper copy in the mail If you have any lab test that is abnormal or we need to change your treatment, we will call you to review the results.  Testing/Procedures: None ordered.  Follow-Up: At [facility], you and your health needs are our priority.  As part of our continuing mission to provide you with exceptional heart care, we have created designated Provider Care Teams.  These Care Teams include your primary Cardiologist (physician) and Advanced Practice Providers (APPs -  Physician Assistants and Nurse Practitioners) who all work together to provide you with the care you need, when you need it.  Your next appointment:   Your physician wants you to follow-up in: 9 months with Dr. Lambert.   You will receive a reminder letter in the mail two months in advance. If you don't receive a letter, please call our office to schedule the follow-up appointment.   

## 2021-05-24 ENCOUNTER — Other Ambulatory Visit: Payer: Managed Care, Other (non HMO)

## 2021-05-24 ENCOUNTER — Other Ambulatory Visit: Payer: Self-pay

## 2021-05-24 ENCOUNTER — Telehealth: Payer: Self-pay

## 2021-05-24 DIAGNOSIS — R300 Dysuria: Secondary | ICD-10-CM

## 2021-05-24 LAB — URINALYSIS, ROUTINE W REFLEX MICROSCOPIC
Bilirubin, UA: NEGATIVE
Glucose, UA: NEGATIVE
Ketones, UA: NEGATIVE
Nitrite, UA: NEGATIVE
Protein,UA: NEGATIVE
Specific Gravity, UA: 1.02 (ref 1.005–1.030)
Urobilinogen, Ur: 1 mg/dL (ref 0.2–1.0)
pH, UA: 5.5 (ref 5.0–7.5)

## 2021-05-24 LAB — MICROSCOPIC EXAMINATION
Epithelial Cells (non renal): NONE SEEN /hpf (ref 0–10)
Renal Epithel, UA: NONE SEEN /hpf
WBC, UA: >30 /hpf — AB (ref 0–5)

## 2021-05-24 MED ORDER — CEFUROXIME AXETIL 500 MG PO TABS: 500.0000 mg | ORAL_TABLET | Freq: Two times a day (BID) | ORAL | 0 refills | Status: AC

## 2021-05-24 NOTE — Telephone Encounter (Addendum)
Patient started on Ceftin per Dr. Ronne Binning.

## 2021-05-27 LAB — URINE CULTURE

## 2021-05-31 ENCOUNTER — Telehealth: Payer: Self-pay

## 2021-05-31 NOTE — Telephone Encounter (Signed)
Patient called. Completed antibiotic therapy and no urinary issues present.

## 2021-06-10 ENCOUNTER — Other Ambulatory Visit: Payer: Self-pay | Admitting: Gastroenterology

## 2021-06-11 NOTE — Telephone Encounter (Signed)
I am sending in a 61-month supply of his omeprazole.  He has not been seen since 2019 and will need an office visit for additional refills.  Please let him know.

## 2021-06-13 NOTE — Telephone Encounter (Signed)
Phoned and advised the pt of his Rx being sent in and it is a 4 month supply of his Omeprazole and that office visit was needed if he is to receive more refills.

## 2021-06-16 ENCOUNTER — Other Ambulatory Visit: Payer: Managed Care, Other (non HMO)

## 2021-06-16 ENCOUNTER — Other Ambulatory Visit: Payer: Self-pay

## 2021-06-16 DIAGNOSIS — R300 Dysuria: Secondary | ICD-10-CM

## 2021-06-16 DIAGNOSIS — N138 Other obstructive and reflux uropathy: Secondary | ICD-10-CM

## 2021-06-16 LAB — URINALYSIS, ROUTINE W REFLEX MICROSCOPIC
Bilirubin, UA: NEGATIVE
Glucose, UA: NEGATIVE
Ketones, UA: NEGATIVE
Nitrite, UA: NEGATIVE
Protein,UA: NEGATIVE
Specific Gravity, UA: 1.015 (ref 1.005–1.030)
Urobilinogen, Ur: 1 mg/dL (ref 0.2–1.0)
pH, UA: 6.5 (ref 5.0–7.5)

## 2021-06-16 LAB — MICROSCOPIC EXAMINATION
Epithelial Cells (non renal): NONE SEEN /hpf (ref 0–10)
Renal Epithel, UA: NONE SEEN /hpf
WBC, UA: >30 /hpf — AB (ref 0–5)

## 2021-06-16 MED ORDER — CEFUROXIME AXETIL 500 MG PO TABS: 500.0000 mg | ORAL_TABLET | Freq: Two times a day (BID) | ORAL | 0 refills | Status: AC

## 2021-06-16 NOTE — Progress Notes (Signed)
Per Dr. Ronne Binning patient will start Ceftin 500 po bid x 7 days. Rx sent and patient called and made aware.

## 2021-06-19 LAB — URINE CULTURE

## 2021-06-29 ENCOUNTER — Telehealth: Payer: Self-pay

## 2021-06-29 ENCOUNTER — Other Ambulatory Visit: Payer: Self-pay

## 2021-06-29 ENCOUNTER — Other Ambulatory Visit: Payer: Managed Care, Other (non HMO)

## 2021-06-29 DIAGNOSIS — R300 Dysuria: Secondary | ICD-10-CM

## 2021-06-29 LAB — URINALYSIS, ROUTINE W REFLEX MICROSCOPIC
Bilirubin, UA: NEGATIVE
Glucose, UA: NEGATIVE
Ketones, UA: NEGATIVE
Nitrite, UA: NEGATIVE
Specific Gravity, UA: 1.02 (ref 1.005–1.030)
Urobilinogen, Ur: 0.2 mg/dL (ref 0.2–1.0)
pH, UA: 5.5 (ref 5.0–7.5)

## 2021-06-29 LAB — MICROSCOPIC EXAMINATION: WBC, UA: >30 /hpf — AB (ref 0–5)

## 2021-06-29 MED ORDER — CEFPODOXIME PROXETIL 200 MG PO TABS: 200.0000 mg | ORAL_TABLET | Freq: Two times a day (BID) | ORAL | 0 refills | Status: DC

## 2021-06-29 NOTE — Progress Notes (Unsigned)
Patient present in office today stating he thinks his UTI has not cleared up. Patient placed on lab schedule today for urine drop off.   Patient started on Vantin per Dr. Ronne Binning. Rx sent and patient made aware.

## 2021-06-29 NOTE — Telephone Encounter (Signed)
Patient left a urine drop off. Continues to have a UTI symptoms.  Please call pt with results.  Call back:  [phone]   Thanks, Rosey Bath

## 2021-06-29 NOTE — Telephone Encounter (Signed)
See lab note.  

## 2021-07-02 LAB — URINE CULTURE

## 2021-07-12 ENCOUNTER — Other Ambulatory Visit: Payer: Self-pay

## 2021-07-12 DIAGNOSIS — R300 Dysuria: Secondary | ICD-10-CM

## 2021-07-12 MED ORDER — CEFPODOXIME PROXETIL 200 MG PO TABS: 200.0000 mg | ORAL_TABLET | Freq: Two times a day (BID) | ORAL | 0 refills | Status: DC

## 2021-09-03 ENCOUNTER — Other Ambulatory Visit: Payer: Self-pay | Admitting: Cardiology

## 2021-09-14 ENCOUNTER — Other Ambulatory Visit: Payer: Self-pay | Admitting: Gastroenterology

## 2021-10-21 ENCOUNTER — Other Ambulatory Visit: Payer: Self-pay | Admitting: Urology

## 2021-10-25 ENCOUNTER — Telehealth: Payer: Self-pay

## 2021-10-25 ENCOUNTER — Other Ambulatory Visit: Payer: Self-pay

## 2021-10-25 DIAGNOSIS — N5201 Erectile dysfunction due to arterial insufficiency: Secondary | ICD-10-CM

## 2021-10-25 NOTE — Telephone Encounter (Signed)
Message left to confirm if [facility] or [facility] mail order.

## 2021-10-25 NOTE — Telephone Encounter (Signed)
Patients wife called advising she needed a refill on patients medication. Patient advised that the medication should be going through [employer]    Medication: tamsulosin (FLOMAX) 0.4 MG CAPS capsule   Pharmacy: [facility]

## 2021-10-26 IMAGING — DX DG CHEST 2V
2 series · 2 of 2 positions shown · non-contrast
Comparison: December 21, 2020

CLINICAL DATA: Shortness of breath

EXAM:
CHEST - 2 VIEW

[chest pa]
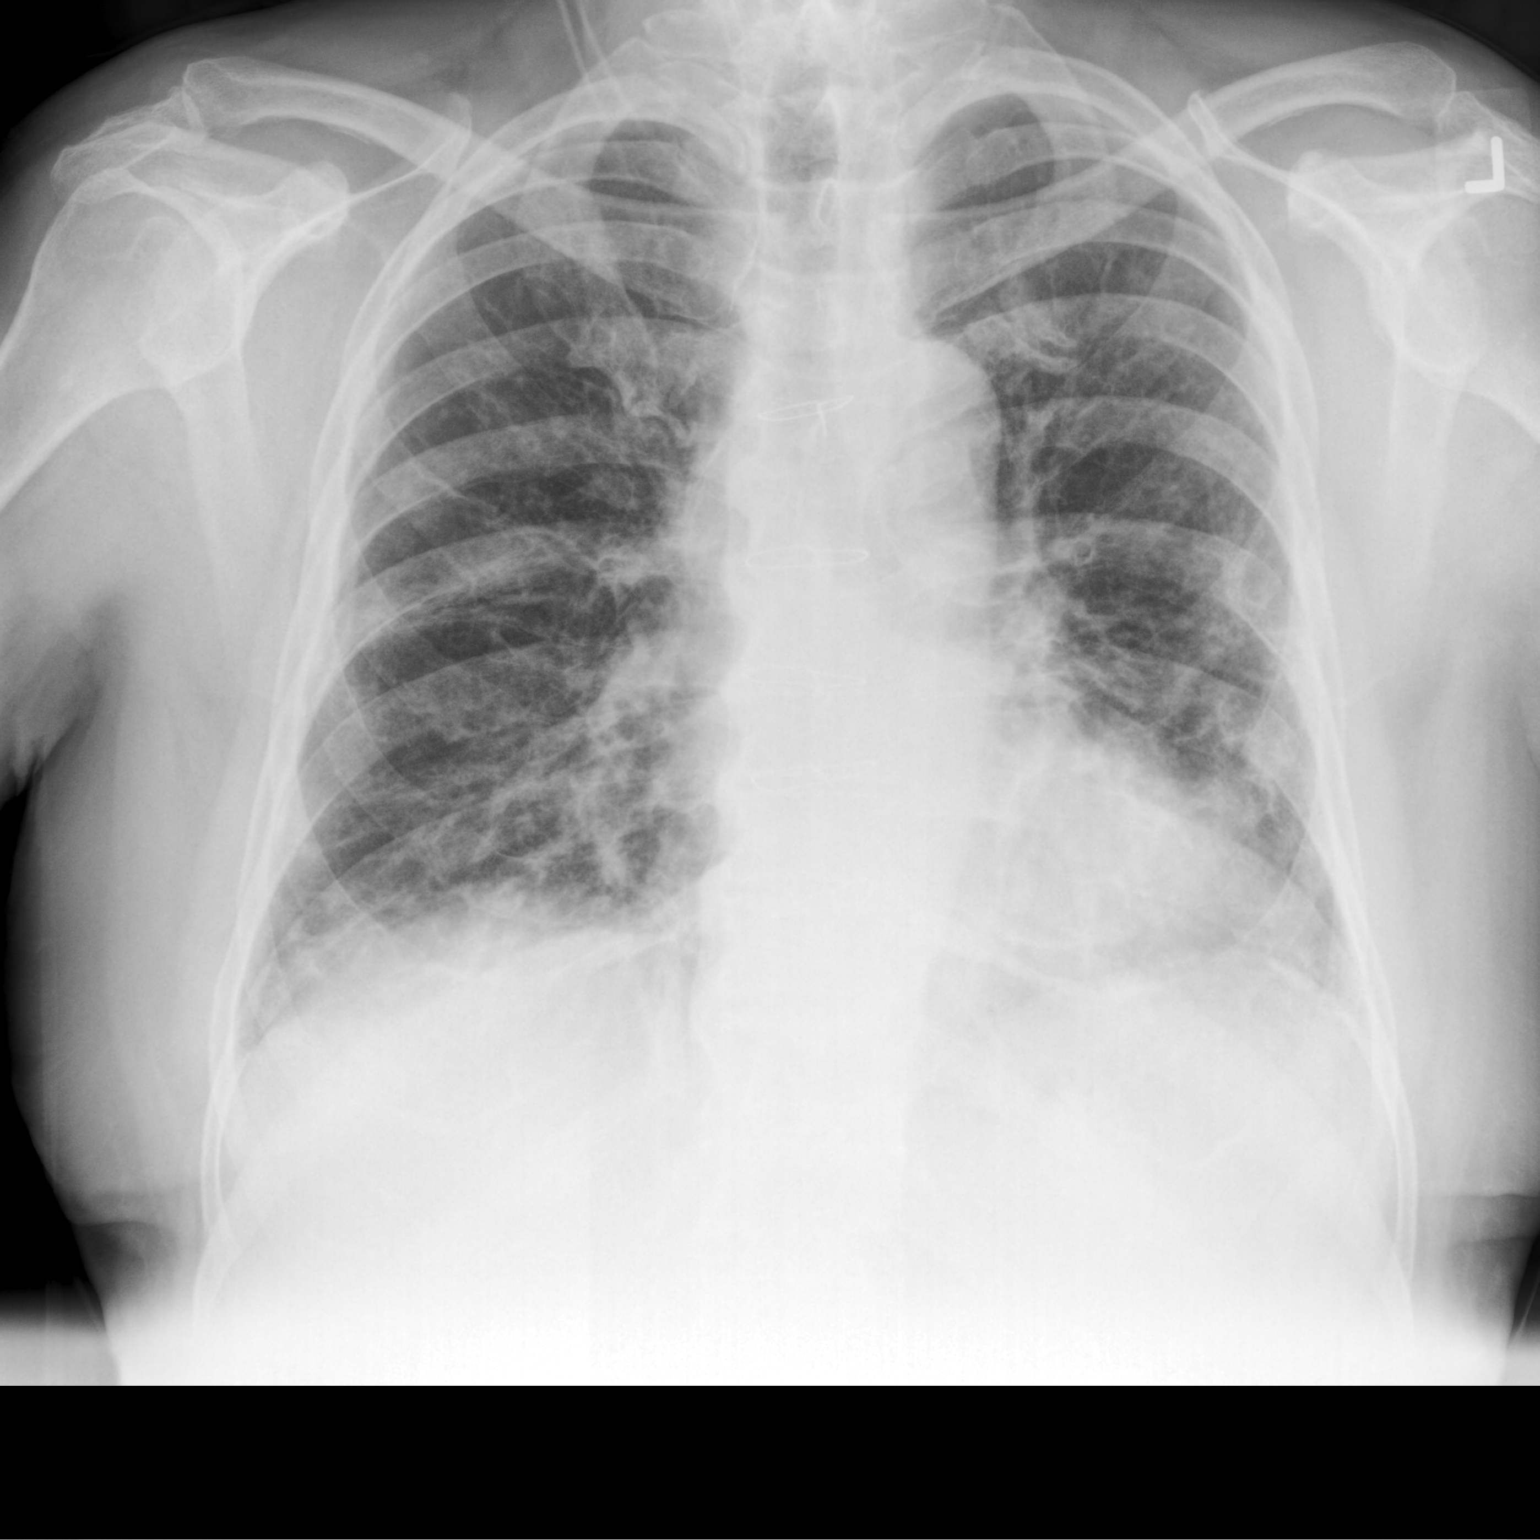

[chest lat]
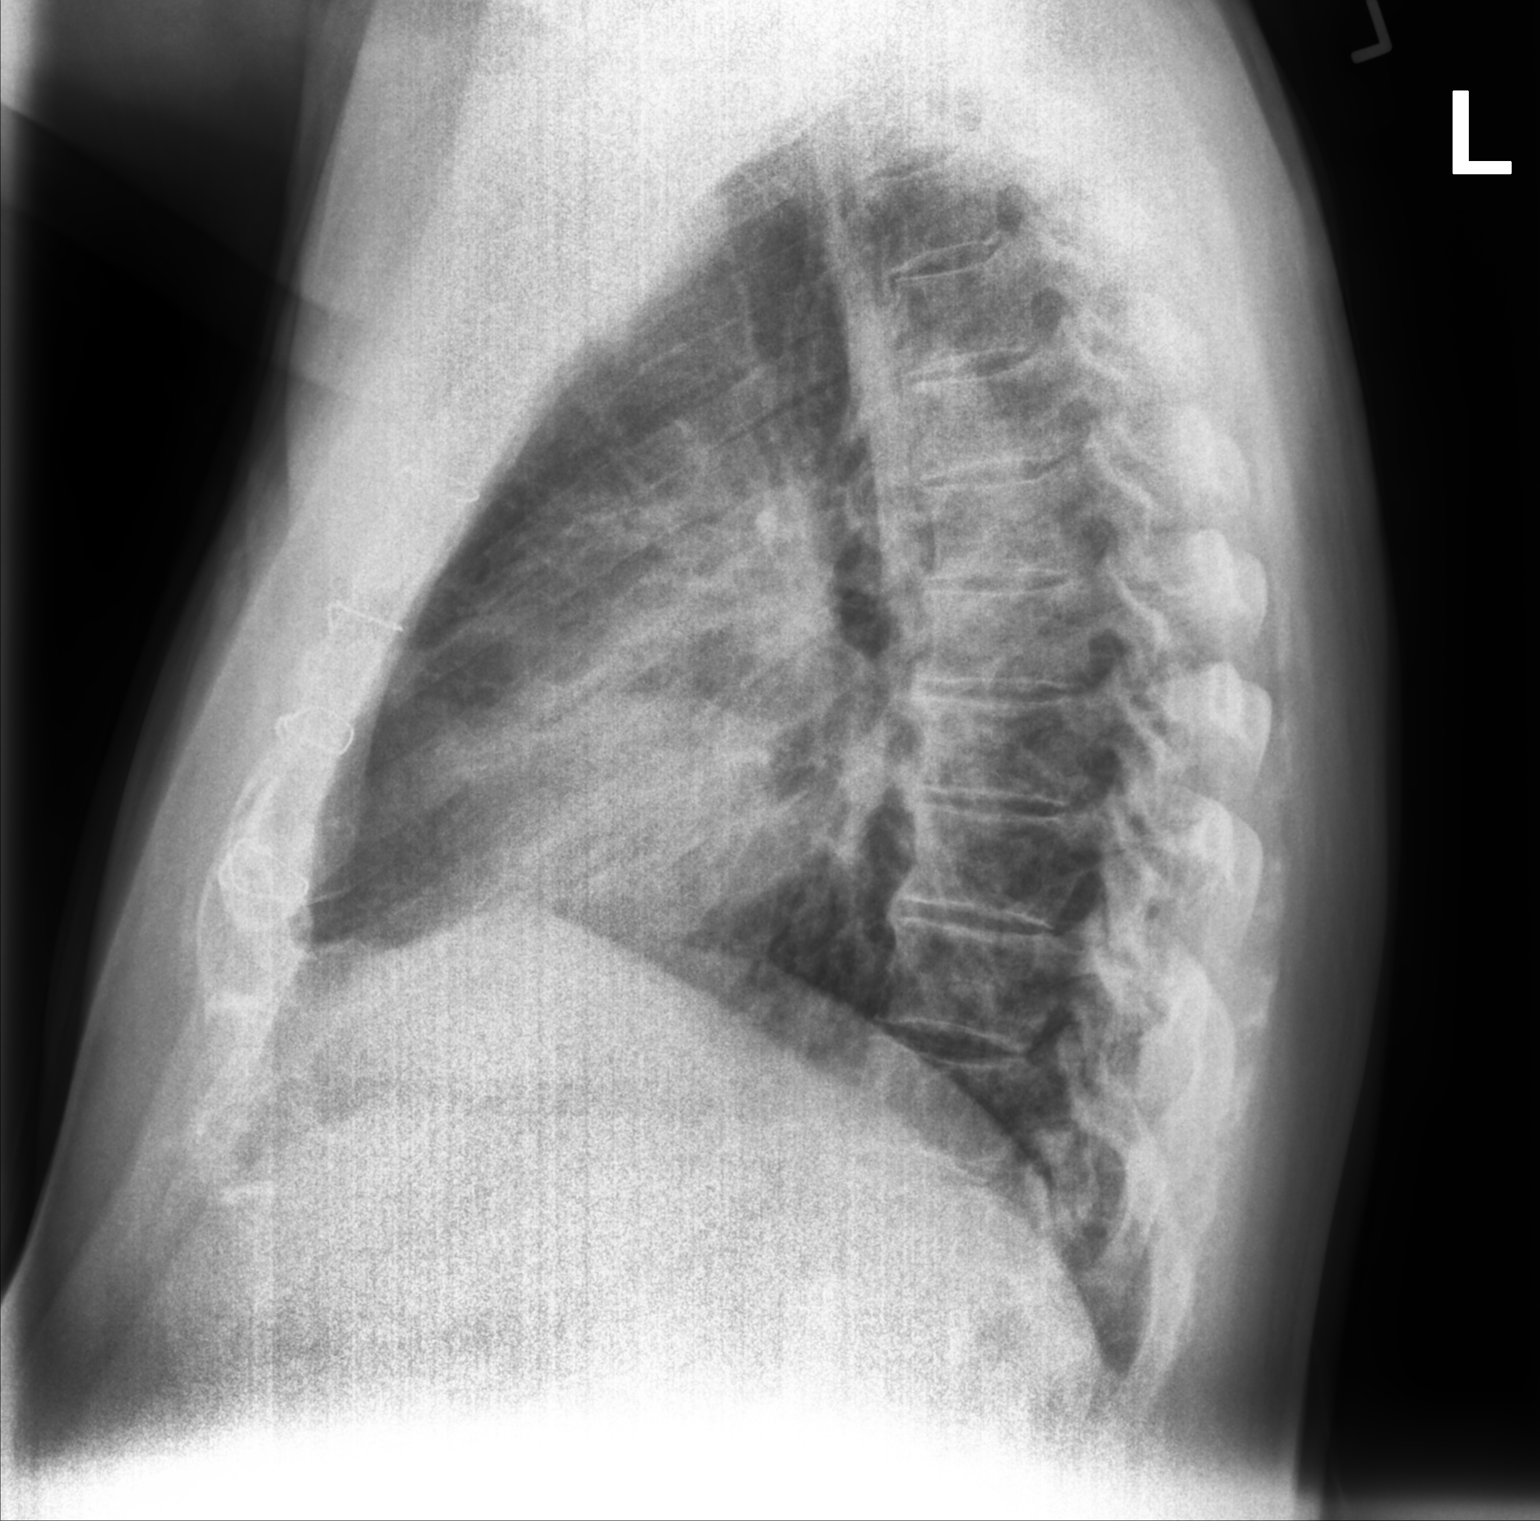

[2 of 2 positions shown; findings below may reference images not displayed]

FINDINGS: Areas of patchy ill-defined airspace opacity remains in the mid and
lower lung regions. No consolidation. Heart size and pulmonary
vascularity are within normal limits. No adenopathy. Patient is
status post median sternotomy.
IMPRESSION: Areas of patchy ill-defined opacity bilaterally which may represent
residual foci of multifocal pneumonia or scarring. Question [id number]
status given this appearance. No consolidation. No new opacity.
Stable cardiac silhouette.

## 2021-10-26 NOTE — Telephone Encounter (Signed)
Patient confirmed  tamsulosin (FLOMAX) 0.4 MG CAPS capsule  to be refilled with: [facility]   Thanks, Rosey Bath

## 2021-10-26 NOTE — Telephone Encounter (Signed)
Rx sent 

## 2021-11-16 ENCOUNTER — Other Ambulatory Visit: Payer: Self-pay | Admitting: Cardiology

## 2021-11-16 NOTE — Telephone Encounter (Signed)
Pt last saw Dr Lalla Brothers 05/13/21, last labs 08/31/21 Creat 1.3, age 70, weight 96.7kg, based on specified criteria pt is on appropriate dosage of Eliquis 5mg  BID for afib.  Will refill rx.

## 2021-11-29 ENCOUNTER — Encounter: Payer: Self-pay | Admitting: Gastroenterology

## 2021-11-29 ENCOUNTER — Other Ambulatory Visit: Payer: Self-pay

## 2021-11-29 ENCOUNTER — Ambulatory Visit (INDEPENDENT_AMBULATORY_CARE_PROVIDER_SITE_OTHER): Payer: Managed Care, Other (non HMO) | Admitting: Gastroenterology

## 2021-11-29 VITALS — BP 151/79 | HR 76 | Temp 97.1°F | Ht 73.0 in | Wt 219.6 lb

## 2021-11-29 DIAGNOSIS — K219 Gastro-esophageal reflux disease without esophagitis: Secondary | ICD-10-CM | POA: Diagnosis not present

## 2021-11-29 MED ORDER — OMEPRAZOLE 20 MG PO CPDR: DELAYED_RELEASE_CAPSULE | ORAL | 3 refills | Status: DC

## 2021-11-29 NOTE — Progress Notes (Signed)
Primary Care Physician: Sharilyn Sites, MD  Primary Gastroenterologist:  Elon Alas. Abbey Chatters, DO   Chief Complaint  Patient presents with   Gastroesophageal Reflux    rare    HPI: [name] is a 71 y.o. male with myasthenia gravis (diagnosed 2002) who is here for follow up. Patient has history of esophageal strictures/food impaction requiring multiple EGDs with esophageal dilation. Last EGD in 2019. Thought to have silent reflux given recurrent strictures and no significant heartburn. Last seen in the office in 2019.  Doing well. Takes omeprazole 20mg  BID. Never tried once daily dosing as suggested at last office visit. No problems swallowing. Only rare heartburn. No vomiting. No unintentional weight loss. No abdominal pain. BMs regular. No melena, brbpr. He is on Eliquis for Afib and underwent ablation 07/2020, cardioversion 08/2020. Has maintained sinus rhythm.   EGD March 2019 -Benign-appearing esophageal stricture. Dilated. -Non-bleeding Cameron's gastric ulcer with no stigmata of bleeding. Biopsied. -A few gastric polyps. Resected and retrieved. -Small hiatal hernia. -Mild Gastritis. -Path showed chronic active gastritis, negative for H.pylori, fundic gland polyp.   Colonoscopy August 2015 -Moderate diverticulosis in the descending colon and sigmoid colon -The colon mucosa was otherwise normal -Moderate sized internal hemorrhoids -The LEFT colon IS redundant -next colonoscopy in 2025.  Current Outpatient Medications  Medication Sig Dispense Refill   acyclovir (ZOVIRAX) 400 MG tablet Take 800 mg by mouth daily.      apixaban (ELIQUIS) 5 MG TABS tablet TAKE (1) TABLET BY MOUTH TWICE DAILY. 60 tablet 5   Ascorbic Acid (VITAMIN C) 1000 MG tablet Take 2,000 mg by mouth daily after lunch.      azaTHIOprine (IMURAN) 50 MG tablet Take 150 mg by mouth daily.      Calcium Carbonate-Vitamin D (CALCIUM 600+D) 600-200 MG-UNIT TABS Take 2 tablets by mouth daily after lunch.       glimepiride (AMARYL) 1 MG tablet Take 1 tablet by mouth daily.     losartan (COZAAR) 50 MG tablet TAKE (1) TABLET BY MOUTH ONCE DAILY. 90 tablet 1   metFORMIN (GLUCOPHAGE-XR) 500 MG 24 hr tablet Take 1,000 mg by mouth 2 (two) times daily.     Omega-3 Fatty Acids (FISH OIL ULTRA) 1400 MG CAPS Take 1,400 mg by mouth daily after lunch.     omeprazole (PRILOSEC) 20 MG capsule TAKE (1) CAPSULE BY MOUTH TWICE DAILY. 180 capsule 0   ONETOUCH VERIO test strip 1 each 3 (three) times daily.     OVER THE COUNTER MEDICATION Vitamin B one tablet daily     Saw Palmetto 450 MG CAPS Take 2,250 mg by mouth daily after lunch.     SYNTHROID 137 MCG tablet Take 137 mcg by mouth daily before breakfast.     tadalafil (CIALIS) 20 MG tablet TAKE 1 TABLET BY MOUTH AS NEEDED 10 tablet 0   tamsulosin (FLOMAX) 0.4 MG CAPS capsule TAKE 1 CAPSULE BY MOUTH TWICE DAILY 180 capsule 3   vitamin E 180 MG (400 UNITS) capsule Take 800 Units by mouth daily after lunch.     zinc sulfate 220 (50 Zn) MG capsule Take 1 capsule (220 mg total) by mouth daily. (Patient taking differently: Take 220 mg by mouth as needed.) 30 capsule 1   No current facility-administered medications for this visit.    Allergies as of 11/29/2021 - Review Complete 11/29/2021  Allergen Reaction Noted   Levofloxacin Anaphylaxis 03/01/2011   Tequin [gatifloxacin] Anaphylaxis 06/07/2012   Aminoglycosides Other (See Comments) 06/07/2012  Avelox [moxifloxacin hcl in nacl] Other (See Comments) 06/07/2012   Botox [onabotulinumtoxina] Other (See Comments) 06/07/2012   Botulinum toxins Other (See Comments) 06/07/2012   Calcium channel blockers Other (See Comments) 06/07/2012   Ciprofloxacin Other (See Comments) 06/07/2012   Contrast media [iodinated contrast media] Other (See Comments) 02/07/2018   Curare [tubocurarine] Other (See Comments) 06/07/2012   Dysport [abobotulinumtoxina] Other (See Comments) 06/07/2012   Erythromycin Other (See Comments) 06/07/2012    Factive [gemifloxacin] Other (See Comments) 06/07/2012   Floxin [ofloxacin] Other (See Comments) 06/07/2012   Gentamycin [gentamicin] Other (See Comments) 06/07/2012   Interferons Other (See Comments) 06/07/2012   Kanamycin Other (See Comments) 06/07/2012   Ketek [telithromycin] Other (See Comments) 06/07/2012   Levaquin [levofloxacin in d5w] Other (See Comments) 06/07/2012   Limonene Other (See Comments) 02/10/2015   Macrolides and ketolides Other (See Comments) 06/07/2012   Magnesium-containing compounds Other (See Comments) 04/01/2014   Myobloc [rimabotulinumtoxinb] Other (See Comments) 06/07/2012   Neomycin Other (See Comments) 06/07/2012   Noroxin [norfloxacin] Other (See Comments) 06/07/2012   Other Other (See Comments) 06/07/2012   Penicillamine Other (See Comments) 06/07/2012   Procainamide Other (See Comments) 06/07/2012   Propranolol Other (See Comments) 06/07/2012   Quinidine Other (See Comments) 06/07/2012   Quinine derivatives Other (See Comments) 06/07/2012   Streptomycin Other (See Comments) 06/07/2012   Timolol Other (See Comments) 06/07/2012   Tobramycin Other (See Comments) 06/07/2012   Xeomin [incobotulinumtoxina] Other (See Comments) 06/07/2012   Zithromax [azithromycin] Other (See Comments) 06/07/2012    ROS:  General: Negative for anorexia, weight loss, fever, chills, fatigue, weakness. ENT: Negative for hoarseness, difficulty swallowing , nasal congestion. CV: Negative for chest pain, angina, palpitations, dyspnea on exertion, peripheral edema.  Respiratory: Negative for dyspnea at rest, dyspnea on exertion, cough, sputum, wheezing.  GI: See history of present illness. GU:  Negative for dysuria, hematuria, urinary incontinence, urinary frequency, nocturnal urination.  Endo: Negative for unusual weight change.    Physical Examination:   BP (!) 151/79    Pulse 76    Temp (!) 97.1 F (36.2 C) (Temporal)    Ht 6\' 1"  (1.854 m)    Wt 219 lb 9.6 oz (99.6 kg)     BMI 28.97 kg/m   General: Well-nourished, well-developed in no acute distress.  Eyes: No icterus. Mouth: masked Lungs: Clear to auscultation bilaterally.  Heart: Regular rate and rhythm, no murmurs rubs or gallops.  Abdomen: Bowel sounds are normal, nontender, nondistended, no hepatosplenomegaly or masses, no abdominal bruits or hernia , no rebound or guarding.   Extremities: No lower extremity edema. No clubbing or deformities. Neuro: Alert and oriented x 4   Skin: Warm and dry, no jaundice.   Psych: Alert and cooperative, normal mood and affect.  Labs:  October 2022: White blood cell count 7000, hemoglobin 14.2, platelets 238,000, sodium 141, potassium 4.4, creatinine 1.3, total bilirubin 0.7, alkaline phosphatase 49, AST 38, ALT 41.  Imaging Studies: No results found.   Assessment:  GERD: history of complicated GERD with recurrent esophageal strictures. Remote food impaction in 2014. Several esophageal dilations since then, last one in 2019. Doing well. Denies h/o significant heartburn symptoms, presenting more as a silent refluxer. Discussed trying to reduce omeprazole to once daily dosing. Monitor closely for any changes as he would not necessarily have heartburn as breakthrough symptom. If any changes, especially with dysphagia, then would go back to twice daily dosing.   He will be due for colonoscopy in 2025.  Plan: Return to the office in 2 years for follow-up.  We will schedule him for his 10-year screening colonoscopy at that time.   Trial of omeprazole 20 mg once daily, may go back to twice daily if needed.

## 2021-11-29 NOTE — Patient Instructions (Signed)
Try to reduce your omeprazole to once daily before breakfast. If you notice any recurrent heartburn, problems swallowing, etc then you can go back to twice daily. You will be due colonoscopy in 2025. We will plan to see you back in the office in 2 years for follow up and arrange your colonoscopy. If you have any questions/concerns, please call in the interim.

## 2022-04-05 ENCOUNTER — Ambulatory Visit (INDEPENDENT_AMBULATORY_CARE_PROVIDER_SITE_OTHER): Payer: Managed Care, Other (non HMO) | Admitting: Urology

## 2022-04-05 ENCOUNTER — Encounter: Payer: Self-pay | Admitting: Urology

## 2022-04-05 VITALS — BP 147/79 | HR 85

## 2022-04-05 DIAGNOSIS — R351 Nocturia: Secondary | ICD-10-CM

## 2022-04-05 DIAGNOSIS — N411 Chronic prostatitis: Secondary | ICD-10-CM

## 2022-04-05 DIAGNOSIS — N401 Enlarged prostate with lower urinary tract symptoms: Secondary | ICD-10-CM

## 2022-04-05 DIAGNOSIS — N5201 Erectile dysfunction due to arterial insufficiency: Secondary | ICD-10-CM | POA: Diagnosis not present

## 2022-04-05 DIAGNOSIS — N138 Other obstructive and reflux uropathy: Secondary | ICD-10-CM

## 2022-04-05 LAB — URINALYSIS, ROUTINE W REFLEX MICROSCOPIC
Bilirubin, UA: NEGATIVE
Glucose, UA: NEGATIVE
Ketones, UA: NEGATIVE
Leukocytes,UA: NEGATIVE
Nitrite, UA: NEGATIVE
Protein,UA: NEGATIVE
Specific Gravity, UA: 1.01 (ref 1.005–1.030)
Urobilinogen, Ur: 0.2 mg/dL (ref 0.2–1.0)
pH, UA: 6 (ref 5.0–7.5)

## 2022-04-05 LAB — MICROSCOPIC EXAMINATION
Bacteria, UA: NONE SEEN
Epithelial Cells (non renal): NONE SEEN /hpf (ref 0–10)
RBC, Urine: NONE SEEN /hpf (ref 0–2)
Renal Epithel, UA: NONE SEEN /hpf
WBC, UA: NONE SEEN /hpf (ref 0–5)

## 2022-04-05 MED ORDER — TADALAFIL 20 MG PO TABS: 20.0000 mg | ORAL_TABLET | ORAL | 5 refills | Status: DC | PRN

## 2022-04-05 MED ORDER — TAMSULOSIN HCL 0.4 MG PO CAPS: 0.4000 mg | ORAL_CAPSULE | Freq: Two times a day (BID) | ORAL | 3 refills | Status: DC

## 2022-04-05 NOTE — Progress Notes (Signed)
04/05/2022 1:59 PM   [name] 1951-03-08 [id number]  Referring provider: Elfredia Nevins, MD [address] [place],  [place] [address]  Followup BPh and erectile dysfunction   HPI: Mr [name] is a 70yo here for followup for BPH, chronic prostatitis, and erectile dysfunction. IPSS 4 QOl 0 on flomax BID. Urine stream strong. No dysuria. NO straining to urinate. NOcturia 1-2x. He uses tadalafil 20mg  PRN for his erectile dysfunction with good results.    PMH: Past Medical History:  Diagnosis Date   Candida esophagitis (HCC) OCT 2014   Deafness in right ear    Diabetes mellitus without complication (HCC)    Difficult intubation    Esophageal stricture SEP 2011   FOOD IMPACTION-->SAV DIL 16 MM   GERD (gastroesophageal reflux disease)    Hypertension    Kidney stone    Myasthenia gravis (HCC)    Sleep apnea    Thyroid disease     Surgical History: Past Surgical History:  Procedure Laterality Date   ATRIAL FIBRILLATION ABLATION N/A 08/12/2020   Procedure: ATRIAL FIBRILLATION ABLATION;  Surgeon: Lanier Prude, MD;  Location: [facility];  Service: Cardiovascular;  Laterality: N/A;   BIOPSY  02/14/2018   Procedure: BIOPSY;  Surgeon: West Bali, MD;  Location: [facility];  Service: Endoscopy;;  gastric   CARDIOVERSION N/A 06/08/2020   Procedure: CARDIOVERSION;  Surgeon: Antoine Poche, MD;  Location: [facility];  Service: Endoscopy;  Laterality: N/A;   CARDIOVERSION N/A 09/16/2020   Procedure: CARDIOVERSION;  Surgeon: Meriam Sprague, MD;  Location: [facility] ENDOSCOPY;  Service: Cardiovascular;  Laterality: N/A;   CHOLECYSTECTOMY     COLONOSCOPY  2005   Dr. Lovell Sheehan   COLONOSCOPY N/A 07/17/2014   Procedure: COLONOSCOPY;  Surgeon: West Bali, MD;  Location: [facility];  Service: Endoscopy;  Laterality: N/A;  9:30-rescheduled 8/28 same time Soledad Gerlach to notify pt   ESOPHAGOGASTRODUODENOSCOPY N/A 07/25/2013   Shallow ulcer in the cardia, mid  esophageal web, stricture at GE junction   ESOPHAGOGASTRODUODENOSCOPY N/A 02/14/2018   Procedure: ESOPHAGOGASTRODUODENOSCOPY (EGD);  Surgeon: West Bali, MD;  Location: [facility];  Service: Endoscopy;  Laterality: N/A;  11:15am   ESOPHAGOGASTRODUODENOSCOPY (EGD) WITH ESOPHAGEAL DILATION N/A 08/29/2013   Candida esophagitis, small hiatal hernia, moderate nonerosive gastritis, mid esophageal web   HERNIA REPAIR     right inguinal   KIDNEY STONE SURGERY     NASAL SEPTUM SURGERY     PALATE SURGERY         SAVORY DILATION N/A 02/14/2018   Procedure: SAVORY DILATION;  Surgeon: West Bali, MD;  Location: [facility];  Service: Endoscopy;  Laterality: N/A;   TEE WITHOUT CARDIOVERSION N/A 08/10/2020   Procedure: TRANSESOPHAGEAL ECHOCARDIOGRAM (TEE);  Surgeon: Chilton Si, MD;  Location: [facility] ENDOSCOPY;  Service: Cardiovascular;  Laterality: N/A;   THYMECTOMY     TONSILLECTOMY     UPPER GASTROINTESTINAL ENDOSCOPY  SEP 2011   SAV DIL    Home Medications:  Allergies as of 04/05/2022       Reactions   Levofloxacin Anaphylaxis   Tequin [gatifloxacin] Anaphylaxis   Levothyroxine Sodium Other (See Comments)   Aminoglycosides Other (See Comments)   REACTION: Myasthenia Gravis (MG) Medication Alert   Avelox [moxifloxacin Hcl In Nacl] Other (See Comments)   REACTION: FLUOROQUINOLONE ANTIBIOTICS: Myasthenia Gravis (MG) Medication Alert   Botox [onabotulinumtoxina] Other (See Comments)   REACTION: Myasthenia Gravis (MG) Medication Alert   Botulinum Toxins Other (See Comments)  REACTION: Myasthenia Gravis (MG) Medication Alert   Calcium Channel Blockers Other (See Comments)   (BLOOD PRESSURE MEDICATION) REACTION: Myasthenia Gravis (MG) Medication Alert   Ciprofloxacin Other (See Comments)   REACTION: FLUOROQUINOLONE ANTIBIOTICS: Myasthenia Gravis (MG) Medication Alert   Contrast Media [iodinated Contrast Media] Other (See Comments)   MD told pt cannot have   Curare  [tubocurarine] Other (See Comments)   (Usually only used during surgery)  REACTION: Myasthenia Gravis (MG) Medication Alert   Dysport [abobotulinumtoxina] Other (See Comments)   REACTION: Myasthenia Gravis (MG) Medication Alert   Erythromycin Other (See Comments)   REACTION: Myasthenia Gravis (MG) Medication Alert   Factive [gemifloxacin] Other (See Comments)   REACTION: FLUOROQUINOLONE ANTIBIOTICS: Myasthenia Gravis (MG) Medication Alert   Floxin [ofloxacin] Other (See Comments)   REACTION: FLUOROQUINOLONE ANTIBIOTICS: Myasthenia Gravis (MG) Medication Alert   Gentamycin [gentamicin] Other (See Comments)   REACTION:Myasthenia Gravis (MG) Medication Alert   Interferons Other (See Comments)   REACTION: Myasthenia Gravis (MG) Medication Alert   Kanamycin Other (See Comments)   REACTION: Myasthenia Gravis (MG) Medication Alert   Ketek [telithromycin] Other (See Comments)   REACTION: Myasthenia Gravis (MG) Medication Alert   Levaquin [levofloxacin In D5w] Other (See Comments)   REACTION: FLUOROQUINOLONE ANTIBIOTICS: Myasthenia Gravis (MG) Medication Alert   Limonene Other (See Comments)   REACTION:Myasthenia Gravis (MG) Medication Alert   Macrolides And Ketolides Other (See Comments)   REACTION: Myasthenia Gravis (MG) Medication Alert REACTION: Myasthenia Gravis (MG) Medication Alert   Magnesium-containing Compounds Other (See Comments)   Unknown   Myobloc [rimabotulinumtoxinb] Other (See Comments)   REACTION: Myasthenia Gravis (MG) Medication Alert   Neomycin Other (See Comments)   REACTION: Myasthenia Gravis (MG) Medication Alert   Noroxin [norfloxacin] Other (See Comments)   REACTION: FLUOROQUINOLONE ANTIBIOTICS: Myasthenia Gravis (MG) Medication Alert   Other Other (See Comments)   MAGNESIUM SALTS: Milk of Magnesia, some antacids (Maalox, Mylanta) REACTION: Myasthenia Gravis (MG) Medication Alerta   Penicillamine Other (See Comments)   D-PENICILLAMINE *DO NOT CONFUSE WITH  PENICILLIN*  REACTION: Myasthenia Gravis (MG) Medication Alert   Procainamide Other (See Comments)   REACTION: Myasthenia Gravis (MG) Medication Alert   Propranolol Other (See Comments)   REACTION: Myasthenia Gravis (MG) Medication Alert   Quinidine Other (See Comments)   REACTION: Myasthenia Gravis (MG) Medication Alert   Quinine Derivatives Other (See Comments)   REACTION: Myasthenia Gravis (MG) Medication Alert   Streptomycin Other (See Comments)   REACTION:  Myasthenia Gravis (MG) Medication Alert   Timolol Other (See Comments)   REACTION: Myasthenia Gravis (MG) Medication Alert   Tobramycin Other (See Comments)   REACTION: Myasthenia Gravis (MG) Medication Alert   Xeomin [incobotulinumtoxina] Other (See Comments)   REACTION: Myasthenia Gravis (MG) Medication Alert   Zithromax [azithromycin] Other (See Comments)   REACTION: Myasthenia Gravis (MG) Medication Alert        Medication List        Accurate as of Apr 05, 2022  1:59 PM. If you have any questions, ask your nurse or doctor.          acyclovir 400 MG tablet Commonly known as: ZOVIRAX Take 800 mg by mouth daily.   azaTHIOprine 50 MG tablet Commonly known as: IMURAN Take 150 mg by mouth daily.   Calcium 600+D 600-5 MG-MCG Tabs Generic drug: Calcium Carbonate-Vitamin D Take 2 tablets by mouth daily after lunch.   Eliquis 5 MG Tabs tablet Generic drug: apixaban TAKE (1) TABLET BY MOUTH TWICE DAILY.   Fish Oil  Ultra 1400 MG Caps Take 1,400 mg by mouth daily after lunch.   glimepiride 1 MG tablet Commonly known as: AMARYL Take 1 tablet by mouth daily.   losartan 50 MG tablet Commonly known as: COZAAR TAKE (1) TABLET BY MOUTH ONCE DAILY.   metFORMIN 500 MG 24 hr tablet Commonly known as: GLUCOPHAGE-XR Take 1,000 mg by mouth 2 (two) times daily.   omeprazole 20 MG capsule Commonly known as: PRILOSEC 1 capsule 30 minutes before morning meal   omeprazole 20 MG capsule Commonly known as:  PRILOSEC Take 20mg , 30 minutes before breakfast daily. You may take a second dose before your evening meal if needed.   OneTouch Verio test strip Generic drug: glucose blood 1 each 3 (three) times daily.   OVER THE COUNTER MEDICATION Vitamin B one tablet daily   Saw Palmetto 450 MG Caps Take 2,250 mg by mouth daily after lunch.   Synthroid 137 MCG tablet Generic drug: levothyroxine Take 137 mcg by mouth daily before breakfast.   tadalafil 20 MG tablet Commonly known as: CIALIS TAKE 1 TABLET BY MOUTH AS NEEDED   tamsulosin 0.4 MG Caps capsule Commonly known as: FLOMAX TAKE 1 CAPSULE BY MOUTH TWICE DAILY   vitamin C 1000 MG tablet Take 2,000 mg by mouth daily after lunch.   vitamin E 180 MG (400 UNITS) capsule Take 800 Units by mouth daily after lunch.   zinc sulfate 220 (50 Zn) MG capsule Take 1 capsule (220 mg total) by mouth daily. What changed:  when to take this reasons to take this        Allergies:  Allergies  Allergen Reactions   Levofloxacin Anaphylaxis   Tequin [Gatifloxacin] Anaphylaxis   Levothyroxine Sodium Other (See Comments)   Aminoglycosides Other (See Comments)    REACTION: Myasthenia Gravis (MG) Medication Alert   Avelox [Moxifloxacin Hcl In Nacl] Other (See Comments)    REACTION: FLUOROQUINOLONE ANTIBIOTICS: Myasthenia Gravis (MG) Medication Alert   Botox [Onabotulinumtoxina] Other (See Comments)    REACTION: Myasthenia Gravis (MG) Medication Alert   Botulinum Toxins Other (See Comments)    REACTION: Myasthenia Gravis (MG) Medication Alert   Calcium Channel Blockers Other (See Comments)    (BLOOD PRESSURE MEDICATION) REACTION: Myasthenia Gravis (MG) Medication Alert   Ciprofloxacin Other (See Comments)    REACTION: FLUOROQUINOLONE ANTIBIOTICS: Myasthenia Gravis (MG) Medication Alert   Contrast Media [Iodinated Contrast Media] Other (See Comments)    MD told pt cannot have   Curare [Tubocurarine] Other (See Comments)    (Usually only  used during surgery)  REACTION: Myasthenia Gravis (MG) Medication Alert   Dysport [Abobotulinumtoxina] Other (See Comments)    REACTION: Myasthenia Gravis (MG) Medication Alert   Erythromycin Other (See Comments)    REACTION: Myasthenia Gravis (MG) Medication Alert   Factive [Gemifloxacin] Other (See Comments)    REACTION: FLUOROQUINOLONE ANTIBIOTICS: Myasthenia Gravis (MG) Medication Alert   Floxin [Ofloxacin] Other (See Comments)    REACTION: FLUOROQUINOLONE ANTIBIOTICS: Myasthenia Gravis (MG) Medication Alert   Gentamycin [Gentamicin] Other (See Comments)    REACTION:Myasthenia Gravis (MG) Medication Alert   Interferons Other (See Comments)    REACTION: Myasthenia Gravis (MG) Medication Alert   Kanamycin Other (See Comments)    REACTION: Myasthenia Gravis (MG) Medication Alert   Ketek [Telithromycin] Other (See Comments)    REACTION: Myasthenia Gravis (MG) Medication Alert   Levaquin [Levofloxacin In D5w] Other (See Comments)    REACTION: FLUOROQUINOLONE ANTIBIOTICS: Myasthenia Gravis (MG) Medication Alert   Limonene Other (See Comments)  REACTION:Myasthenia Gravis (MG) Medication Alert   Macrolides And Ketolides Other (See Comments)    REACTION: Myasthenia Gravis (MG) Medication Alert REACTION: Myasthenia Gravis (MG) Medication Alert   Magnesium-Containing Compounds Other (See Comments)    Unknown   Myobloc [Rimabotulinumtoxinb] Other (See Comments)    REACTION: Myasthenia Gravis (MG) Medication Alert   Neomycin Other (See Comments)    REACTION: Myasthenia Gravis (MG) Medication Alert   Noroxin [Norfloxacin] Other (See Comments)    REACTION: FLUOROQUINOLONE ANTIBIOTICS: Myasthenia Gravis (MG) Medication Alert   Other Other (See Comments)    MAGNESIUM SALTS: Milk of Magnesia, some antacids (Maalox, Mylanta) REACTION: Myasthenia Gravis (MG) Medication Alerta   Penicillamine Other (See Comments)    D-PENICILLAMINE *DO NOT CONFUSE WITH PENICILLIN*  REACTION: Myasthenia Gravis  (MG) Medication Alert   Procainamide Other (See Comments)    REACTION: Myasthenia Gravis (MG) Medication Alert   Propranolol Other (See Comments)    REACTION: Myasthenia Gravis (MG) Medication Alert   Quinidine Other (See Comments)    REACTION: Myasthenia Gravis (MG) Medication Alert   Quinine Derivatives Other (See Comments)    REACTION: Myasthenia Gravis (MG) Medication Alert   Streptomycin Other (See Comments)    REACTION:  Myasthenia Gravis (MG) Medication Alert   Timolol Other (See Comments)    REACTION: Myasthenia Gravis (MG) Medication Alert   Tobramycin Other (See Comments)    REACTION: Myasthenia Gravis (MG) Medication Alert   Xeomin [Incobotulinumtoxina] Other (See Comments)    REACTION: Myasthenia Gravis (MG) Medication Alert   Zithromax [Azithromycin] Other (See Comments)    REACTION: Myasthenia Gravis (MG) Medication Alert    Family History: Family History  Problem Relation Age of Onset   Liver cancer Father        age 89, deceased   Colon cancer Neg Hx     Social History:  reports that he has never smoked. He has never used smokeless tobacco. He reports that he does not drink alcohol and does not use drugs.  ROS: All other review of systems were reviewed and are negative except what is noted above in HPI  Physical Exam: BP (!) 147/79   Pulse 85   Constitutional:  Alert and oriented, No acute distress. HEENT: [place] AT, moist mucus membranes.  Trachea midline, no masses. Cardiovascular: No clubbing, cyanosis, or edema. Respiratory: Normal respiratory effort, no increased work of breathing. GI: Abdomen is soft, nontender, nondistended, no abdominal masses GU: No CVA tenderness.  Lymph: No cervical or inguinal lymphadenopathy. Skin: No rashes, bruises or suspicious lesions. Neurologic: Grossly intact, no focal deficits, moving all 4 extremities. Psychiatric: Normal mood and affect.  Laboratory Data: Lab Results  Component Value Date   WBC 16.7 (H) 12/25/2020    HGB 13.3 12/25/2020   HCT 35.4 (L) 12/25/2020   MCV 96.5 12/25/2020   PLT 274 12/25/2020    Lab Results  Component Value Date   CREATININE 1.04 12/25/2020    No results found for: PSA  No results found for: TESTOSTERONE  Lab Results  Component Value Date   HGBA1C 7.1 (H) 06/07/2020    Urinalysis    Component Value Date/Time   COLORURINE AMBER (A) 09/14/2018 1251   APPEARANCEUR Cloudy (A) 06/29/2021 1023   LABSPEC 1.027 09/14/2018 1251   PHURINE 6.0 09/14/2018 1251   GLUCOSEU Negative 06/29/2021 1023   HGBUR NEGATIVE 09/14/2018 1251   BILIRUBINUR Negative 06/29/2021 1023   KETONESUR 20 (A) 09/14/2018 1251   PROTEINUR 1+ (A) 06/29/2021 1023   PROTEINUR 30 (A) 09/14/2018 1251  UROBILINOGEN 0.2 04/01/2014 2010   NITRITE Negative 06/29/2021 1023   NITRITE NEGATIVE 09/14/2018 1251   LEUKOCYTESUR 2+ (A) 06/29/2021 1023    Lab Results  Component Value Date   LABMICR See below: 06/29/2021   WBCUA >30 (A) 06/29/2021   LABEPIT 0-10 06/29/2021   BACTERIA Few (A) 06/29/2021    Pertinent Imaging:  Results for orders placed during the hospital encounter of 07/16/09  DG Abd 1 View  Narrative Clinical Data: Left ureteral calculus.  ABDOMEN - 1 VIEW 07/16/2009:  Comparison: One-view abdomen x-ray 07/14/2009 and 06/30/2009.  CT abdomen and pelvis 05/19/2009.  Findings: Stable appearance to the 2 adjacent calculi in the distal left ureter.  These are adjacent to and phleboliths in the lower left pelvis as noted on the prior CT.  Phleboliths are also present on the right side of the pelvis.  No visible upper urinary tract calculi on either side.  Bowel gas pattern unremarkable. Degenerative changes again noted throughout the lumbar spine.  IMPRESSION: Stable appearance of the 2 adjacent distal left ureteral calculi.  Provider: Lyda Jester  No results found for this or any previous visit.  No results found for this or any previous visit.  No results found for  this or any previous visit.  No results found for this or any previous visit.  No results found for this or any previous visit.  No results found for this or any previous visit.  Results for orders placed during the hospital encounter of 06/01/17  CT Renal Stone Study  Narrative CLINICAL DATA:  Left flank pain  EXAM: CT ABDOMEN AND PELVIS WITHOUT CONTRAST  TECHNIQUE: Multidetector CT imaging of the abdomen and pelvis was performed following the standard protocol without IV contrast.  COMPARISON:  06/10/2014  FINDINGS: Lower chest: Lung bases are essentially clear.  Hepatobiliary: Mild hepatic steatosis.  Status post cholecystectomy. No intrahepatic or extrahepatic duct dilatation.  Pancreas: Within normal limits.  Spleen: Within normal limits. 15 mm splenule in the left upper abdomen.  Adrenals/Urinary Tract: Adrenal glands are within normal limits.  Kidneys are within normal limits. 2 mm nonobstructing right lower pole renal calculus (series 2/ image 48). 3 mm nonobstructing left lower pole renal calculus (series 2/image 52).  No ureteral or bladder calculi.  No hydronephrosis.  Bladder is within normal limits.  Stomach/Bowel: Stomach is within normal limits.  No evidence of bowel obstruction.  Normal appendix (series 2/image 63).  Left colonic diverticulosis, without evidence of diverticulitis.  Vascular/Lymphatic: No evidence of abdominal aortic aneurysm.  No suspicious abdominopelvic lymphadenopathy.  Reproductive: Prostate is unremarkable.  Other: No abdominopelvic ascites.  Small fat containing left inguinal hernia (series 2/image 93).  Musculoskeletal: Degenerative changes of the visualized thoracolumbar spine.  IMPRESSION: 2 mm nonobstructing right lower pole renal calculus. 3 mm nonobstructing left lower pole renal calculus. No ureteral or bladder calculi. No hydronephrosis.  Sigmoid diverticulosis, without evidence of  diverticulitis.  Mild hepatic steatosis.   Electronically Signed By: Julian Hy M.D. On: 06/01/2017 15:33   Assessment & Plan:    1. Benign prostatic hyperplasia with urinary obstruction -Flomax 0.4mg  BID - Urinalysis, Routine w reflex microscopic  2.  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 Nocturia -Continue flomax 0.4mg  BID   No follow-ups on file.  Nicolette Bang, MD  [facility] [place]

## 2022-04-05 NOTE — Patient Instructions (Signed)

## 2022-04-11 ENCOUNTER — Ambulatory Visit (INDEPENDENT_AMBULATORY_CARE_PROVIDER_SITE_OTHER): Payer: Managed Care, Other (non HMO) | Admitting: Cardiology

## 2022-04-11 ENCOUNTER — Encounter: Payer: Self-pay | Admitting: Cardiology

## 2022-04-11 VITALS — BP 130/76 | HR 71 | Ht 73.0 in | Wt 219.6 lb

## 2022-04-11 DIAGNOSIS — I4819 Other persistent atrial fibrillation: Secondary | ICD-10-CM | POA: Diagnosis not present

## 2022-04-11 DIAGNOSIS — I428 Other cardiomyopathies: Secondary | ICD-10-CM | POA: Diagnosis not present

## 2022-04-11 DIAGNOSIS — I10 Essential (primary) hypertension: Secondary | ICD-10-CM | POA: Diagnosis not present

## 2022-04-11 NOTE — Progress Notes (Signed)
Electrophysiology Office Follow up Visit Note:    Date:  04/11/2022   ID:  [name], DOB 15-Apr-1951, MRN [id number]  PCP:  Assunta Found, MD  [facility] Cardiologist:  Prentice Docker, MD (Inactive)  [facility] Electrophysiologist:  Lanier Prude, MD    Interval History:    [name] is a 71 y.o. male who presents for a follow up visit. They were last seen in clinic 05/13/2021. At that visit he was maintaining sinus rhythm and continued on Eliquis for stroke prophylaxis. His blood pressure was at goal so diltiazem was stopped. He was advised to keep monitoring his blood pressure at home.  Prior Afib ablation on 08/12/2020.  Today, he is accompanied by his wife. He reports feeling well today.  He remains compliant with Eliquis, no bleeding issues.  He confirms his thyroid lab work was checked recently by his endocrinologist.  He denies any palpitations, chest pain, shortness of breath, or peripheral edema. No lightheadedness, headaches, syncope, orthopnea, or PND.  Of note, he plans to retire by next year.     Past Medical History:  Diagnosis Date   Candida esophagitis (HCC) OCT 2014   Deafness in right ear    Diabetes mellitus without complication (HCC)    Difficult intubation    Esophageal stricture SEP 2011   FOOD IMPACTION-->SAV DIL 16 MM   GERD (gastroesophageal reflux disease)    Hypertension    Kidney stone    Myasthenia gravis (HCC)    Sleep apnea    Thyroid disease     Past Surgical History:  Procedure Laterality Date   ATRIAL FIBRILLATION ABLATION N/A 08/12/2020   Procedure: ATRIAL FIBRILLATION ABLATION;  Surgeon: Lanier Prude, MD;  [facility] LAB;  Service: Cardiovascular;  Laterality: N/A;   BIOPSY  02/14/2018   Procedure: BIOPSY;  Surgeon: West Bali, MD;  [facility]O [facility];  Service: Endoscopy;;  gastric   CARDIOVERSION N/A 06/08/2020   Procedure: CARDIOVERSION;  Surgeon: Antoine Poche, MD;   [facility] ENDO [facility];  Service: Endoscopy;  Laterality: N/A;   CARDIOVERSION N/A 09/16/2020   Procedure: CARDIOVERSION;  Surgeon: Meriam Sprague, MD;  Location: [facility] ENDOSCOPY;  Service: Cardiovascular;  Laterality: N/A;   CHOLECYSTECTOMY     COLONOSCOPY  2005   Dr. Lovell Sheehan   COLONOSCOPY N/A 07/17/2014   Procedure: COLONOSCOPY;  Surgeon: West Bali, MD;  [facility]O [facility];  Service: Endoscopy;  Laterality: N/A;  9:30-rescheduled 8/28 same time Soledad Gerlach to notify pt   ESOPHAGOGASTRODUODENOSCOPY N/A 07/25/2013   Shallow ulcer in the cardia, mid esophageal web, stricture at GE junction   ESOPHAGOGASTRODUODENOSCOPY N/A 02/14/2018   Procedure: ESOPHAGOGASTRODUODENOSCOPY (EGD);  Surgeon: West Bali, MD;  [facility];  Service: Endoscopy;  Laterality: N/A;  11:15am   ESOPHAGOGASTRODUODENOSCOPY (EGD) WITH ESOPHAGEAL DILATION N/A 08/29/2013   Candida esophagitis, small hiatal hernia, moderate nonerosive gastritis, mid esophageal web   HERNIA REPAIR     right inguinal   KIDNEY STONE SURGERY     NASAL SEPTUM SURGERY     PALATE SURGERY         SAVORY DILATION N/A 02/14/2018   Procedure: SAVORY DILATION;  Surgeon: West Bali, MD;  [facility];  Service: Endoscopy;  Laterality: N/A;   TEE WITHOUT CARDIOVERSION N/A 08/10/2020   Procedure: TRANSESOPHAGEAL ECHOCARDIOGRAM (TEE);  Surgeon: Chilton Si, MD;  Location: [facility] ENDOSCOPY;  Service: Cardiovascular;  Laterality: N/A;   THYMECTOMY     TONSILLECTOMY  UPPER GASTROINTESTINAL ENDOSCOPY  SEP 2011   SAV DIL    Current Medications: Current Meds  Medication Sig   acyclovir (ZOVIRAX) 400 MG tablet Take 800 mg by mouth daily.    apixaban (ELIQUIS) 5 MG TABS tablet TAKE (1) TABLET BY MOUTH TWICE DAILY.   Ascorbic Acid (VITAMIN C) 1000 MG tablet Take 2,000 mg by mouth daily after lunch.    azaTHIOprine (IMURAN) 50 MG tablet Take 150 mg by mouth daily.    Calcium Carbonate-Vitamin D (CALCIUM 600+D)  600-200 MG-UNIT TABS Take 2 tablets by mouth daily after lunch.    glimepiride (AMARYL) 1 MG tablet Take 1 tablet by mouth daily.   losartan (COZAAR) 50 MG tablet TAKE (1) TABLET BY MOUTH ONCE DAILY.   metFORMIN (GLUCOPHAGE-XR) 500 MG 24 hr tablet Take 1,000 mg by mouth 2 (two) times daily.   Omega-3 Fatty Acids (FISH OIL ULTRA) 1400 MG CAPS Take 1,400 mg by mouth daily after lunch.   omeprazole (PRILOSEC) 20 MG capsule Take 20mg , 30 minutes before breakfast daily. You may take a second dose before your evening meal if needed.   ONETOUCH VERIO test strip 1 each 3 (three) times daily.   OVER THE COUNTER MEDICATION Vitamin B one tablet daily   Saw Palmetto 450 MG CAPS Take 2,250 mg by mouth daily after lunch.   SYNTHROID 137 MCG tablet Take 137 mcg by mouth daily before breakfast.   tadalafil (CIALIS) 20 MG tablet Take 1 tablet (20 mg total) by mouth as needed.   tamsulosin (FLOMAX) 0.4 MG CAPS capsule Take 1 capsule (0.4 mg total) by mouth 2 (two) times daily.   vitamin E 180 MG (400 UNITS) capsule Take 800 Units by mouth daily after lunch.   zinc sulfate 220 (50 Zn) MG capsule Take 1 capsule (220 mg total) by mouth daily.     Allergies:   Levofloxacin, Tequin [gatifloxacin], Iodinated contrast media, Levothyroxine sodium, Limonene, Magnesium-containing compounds, Aminoglycosides, Avelox [moxifloxacin hcl in nacl], Botox [onabotulinumtoxina], Botulinum toxins, Calcium channel blockers, Ciprofloxacin, Curare [tubocurarine], Dysport [abobotulinumtoxina], Erythromycin, Factive [gemifloxacin], Floxin [ofloxacin], Gentamycin [gentamicin], Interferons, Kanamycin, Ketek [telithromycin], Levaquin [levofloxacin in d5w], Levofloxacin, Macrolides and ketolides, Myobloc [rimabotulinumtoxinb], Neomycin, Noroxin [norfloxacin], Other, Penicillamine, Procainamide, Propranolol, Quinidine, Quinine derivatives, Streptomycin, Timolol, Tobramycin, Xeomin [incobotulinumtoxina], and Zithromax [azithromycin]   Social  History   Socioeconomic History   Marital status: Married    Spouse name: Not on file   Number of children: Not on file   Years of education: Not on file   Highest education level: Not on file  Occupational History   Not on file  Tobacco Use   Smoking status: Never   Smokeless tobacco: Never  Vaping Use   Vaping Use: Never used  Substance and Sexual Activity   Alcohol use: No   Drug use: No   Sexual activity: Yes    Partners: Female    Birth control/protection: None    Comment: spouse  Other Topics Concern   Not on file  Social History Narrative   Not on file   Social Determinants of Health   Financial Resource Strain: Not on file  Food Insecurity: Not on file  Transportation Needs: Not on file  Physical Activity: Not on file  Stress: Not on file  Social Connections: Not on file     Family History: The patient's family history includes Liver cancer in his father. There is no history of Colon cancer.  ROS:   Please see the history of present illness.  All other systems reviewed and are negative.  EKGs/Labs/Other Studies Reviewed:    The following studies were reviewed today:  May 11, 2020 echo Left ventricular function mildly reduced, 45%   Apr 06, 2021 echo personally reviewed Left ventricular function normal, 55% Right ventricular function normal No significant valvular abnormalities  EKG:  EKG is personally reviewed.  04/11/2022: Sinus rhythm 05/13/2021: Sinus rhythm.  Recent Labs: No results found for requested labs within last 8760 hours.   Recent Lipid Panel    Component Value Date/Time   TRIG 101 12/21/2020 0951    Physical Exam:    VS:  BP 130/76   Pulse 71   Ht 6\' 1"  (1.854 m)   Wt 219 lb 9.6 oz (99.6 kg)   SpO2 94%   BMI 28.97 kg/m     Wt Readings from Last 3 Encounters:  04/11/22 219 lb 9.6 oz (99.6 kg)  11/29/21 219 lb 9.6 oz (99.6 kg)  05/13/21 213 lb 3.2 oz (96.7 kg)     GEN: Well nourished, well developed in no acute  distress HEENT: Normal NECK: No JVD; No carotid bruits LYMPHATICS: No lymphadenopathy CARDIAC: RRR, no murmurs, rubs, gallops RESPIRATORY:  Clear to auscultation without rales, wheezing or rhonchi  ABDOMEN: Soft, non-tender, non-distended MUSCULOSKELETAL:  No edema; No deformity  SKIN: Warm and dry NEUROLOGIC:  Alert and oriented x 3 PSYCHIATRIC:  Normal affect        ASSESSMENT:    1. Persistent atrial fibrillation (HCC)   2. NICM (nonischemic cardiomyopathy) (HCC)   3. Primary hypertension    PLAN:    In order of problems listed above:  #Persistent atrial fibrillation Maintaining sinus rhythm after his ablation in September 2021.  Continue Eliquis for stroke prophylaxis.  #Nonischemic cardiomyopathy NYHA class I.  Continue current medical therapy  #Hypertension At goal.  Continue monitoring 1-2 times per week.  Continue primary care follow-up.    Follow-up in 1 year.  Medication Adjustments/Labs and Tests Ordered: Current medicines are reviewed at length with the patient today.  Concerns regarding medicines are outlined above.  Orders Placed This Encounter  Procedures   EKG 12-Lead   No orders of the defined types were placed in this encounter.   I,Mathew Stumpf,acting as a October 2021 for Neurosurgeon, MD.,have documented all relevant documentation on the behalf of Lanier Prude, MD,as directed by  Lanier Prude, MD while in the presence of Lanier Prude, MD.  I, Lanier Prude, MD, have reviewed all documentation for this visit. The documentation on 04/11/22 for the exam, diagnosis, procedures, and orders are all accurate and complete.   Signed, 04/13/22, MD, [facility], [facility] 04/11/2022 8:08 AM    Electrophysiology [facility]

## 2022-04-11 NOTE — Patient Instructions (Signed)

## 2022-04-24 ENCOUNTER — Telehealth: Payer: Self-pay

## 2022-04-24 NOTE — Telephone Encounter (Signed)
Patient states that you were supposed to send in generic for Flomax to pharmacy.  Could not confirm this from last office note, ok to send in Tamsulosin? Please advise.

## 2022-04-26 NOTE — Telephone Encounter (Signed)
Called patient to inform him the rx is at [facility] pharmacy he just has to request the generic rx be filled.  Patient states he will call his pharmacy to confirm and will reach out if there are any issues.

## 2022-06-06 ENCOUNTER — Other Ambulatory Visit: Payer: Self-pay | Admitting: Cardiology

## 2022-06-14 ENCOUNTER — Other Ambulatory Visit: Payer: Self-pay | Admitting: Cardiology

## 2022-06-14 DIAGNOSIS — I4819 Other persistent atrial fibrillation: Secondary | ICD-10-CM

## 2022-06-14 NOTE — Telephone Encounter (Signed)
Prescription refill request for Eliquis received. Indication: Afib  Last office visit: 04/11/22 [name]) Scr: 1.3 (08/31/21) Age: 71 Weight: 99.6kg  Appropriate dose and refill sent to requested pharmacy.

## 2022-07-03 ENCOUNTER — Telehealth: Payer: Self-pay | Admitting: Cardiology

## 2022-07-03 NOTE — Telephone Encounter (Signed)
The patient has a knee that is bothering him and he can not take NSAIDS because of the eliquis. The patient states that the prednisone will be a short taper to get the swelling down. He notes that he starts PT next week. Talked to the patient about potential fast heart rates if he takes this. He would like to know what Dr. Lalla Brothers thinks.  Will forward to MD for advisement.

## 2022-07-03 NOTE — Telephone Encounter (Signed)
Patient wants to know if he can take prednisone.

## 2022-07-03 NOTE — Telephone Encounter (Signed)
Patient calling back.   °

## 2022-07-04 ENCOUNTER — Ambulatory Visit (HOSPITAL_COMMUNITY): Payer: Managed Care, Other (non HMO) | Attending: Orthopedic Surgery

## 2022-07-04 DIAGNOSIS — M6281 Muscle weakness (generalized): Secondary | ICD-10-CM | POA: Diagnosis present

## 2022-07-04 DIAGNOSIS — M545 Low back pain, unspecified: Secondary | ICD-10-CM | POA: Insufficient documentation

## 2022-07-04 NOTE — Therapy (Signed)
OUTPATIENT PHYSICAL THERAPY THORACOLUMBAR EVALUATION   Patient Name: [name] MRN: [id number] DOB:03/19/1951, 71 y.o., male Today's Date: 07/04/2022    Past Medical History:  Diagnosis Date   Candida esophagitis ([place]) OCT 2014   Deafness in right ear    Diabetes mellitus without complication ([place])    Difficult intubation    Esophageal stricture SEP 2011   FOOD IMPACTION-->SAV DIL 16 MM   GERD (gastroesophageal reflux disease)    Hypertension    Kidney stone    Myasthenia gravis ([place])    Sleep apnea    Thyroid disease    Past Surgical History:  Procedure Laterality Date   ATRIAL FIBRILLATION ABLATION N/A 08/12/2020   Procedure: ATRIAL FIBRILLATION ABLATION;  Surgeon: Vickie Epley, MD;  Location: [place] CV LAB;  Service: Cardiovascular;  Laterality: N/A;   BIOPSY  02/14/2018   Procedure: BIOPSY;  Surgeon: Danie Binder, MD;  [facility];  Service: Endoscopy;;  gastric   CARDIOVERSION N/A 06/08/2020   Procedure: CARDIOVERSION;  Surgeon: Arnoldo Lenis, MD;  [facility];  Service: Endoscopy;  Laterality: N/A;   CARDIOVERSION N/A 09/16/2020   Procedure: CARDIOVERSION;  Surgeon: Freada Bergeron, MD;  Location: [facility] ENDOSCOPY;  Service: Cardiovascular;  Laterality: N/A;   CHOLECYSTECTOMY     COLONOSCOPY  2005   Dr. Arnoldo Morale   COLONOSCOPY N/A 07/17/2014   Procedure: COLONOSCOPY;  Surgeon: Danie Binder, MD;  [facility];  Service: Endoscopy;  Laterality: N/A;  9:30-rescheduled 8/28 same time Darius Bump to notify pt   ESOPHAGOGASTRODUODENOSCOPY N/A 07/25/2013   Shallow ulcer in the cardia, mid esophageal web, stricture at GE junction   ESOPHAGOGASTRODUODENOSCOPY N/A 02/14/2018   Procedure: ESOPHAGOGASTRODUODENOSCOPY (EGD);  Surgeon: Danie Binder, MD;  [facility];  Service: Endoscopy;  Laterality: N/A;  11:15am   ESOPHAGOGASTRODUODENOSCOPY (EGD) WITH ESOPHAGEAL DILATION N/A 08/29/2013   Candida esophagitis, small  hiatal hernia, moderate nonerosive gastritis, mid esophageal web   HERNIA REPAIR     right inguinal   KIDNEY STONE SURGERY     NASAL SEPTUM SURGERY     PALATE SURGERY         SAVORY DILATION N/A 02/14/2018   Procedure: SAVORY DILATION;  Surgeon: Danie Binder, MD;  [facility];  Service: Endoscopy;  Laterality: N/A;   TEE WITHOUT CARDIOVERSION N/A 08/10/2020   Procedure: TRANSESOPHAGEAL ECHOCARDIOGRAM (TEE);  Surgeon: Skeet Latch, MD;  Location: [place];  Service: Cardiovascular;  Laterality: N/A;   THYMECTOMY     TONSILLECTOMY     UPPER GASTROINTESTINAL ENDOSCOPY  SEP 2011   SAV DIL   Patient Active Problem List   Diagnosis Date Noted   Nocturia 04/05/2021   Erectile dysfunction due to arterial insufficiency 04/05/2021   Type 2 diabetes mellitus with hyperglycemia, without long-term current use of insulin ([place])    Acute respiratory disease due to COVID-19 virus 12/21/2020   Pneumonia due to COVID-19 virus 12/21/2020   Acute respiratory failure with hypoxia Due to Covid 12/21/2020   Chronic anticoagulation 12/21/2020   Dysuria 10/19/2020   Acute prostatitis 10/19/2020   Benign prostatic hyperplasia with urinary obstruction 10/19/2020   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 10/19/2020   Persistent atrial fibrillation ([place]) 07/01/2020   Secondary hypercoagulable state ([place]) 07/01/2020   Dysphagia    Plantar fasciitis 09/04/2017   Cough 03/03/2017   Bacterial pharyngitis 03/03/2017   Colon cancer screening 06/03/2014   Diverticulitis large intestine 04/01/2014   Diarrhea 04/01/2014   Candida  esophagitis (HCC) 11/26/2013   UTI (lower urinary tract infection) 06/07/2012   Myasthenia gravis (HCC)    Hypertension    GERD (gastroesophageal reflux disease) 03/02/2011   Esophageal stricture 07/21/2010    PCP: Assunta Found, MD  REFERRING PROVIDER:   Gean Birchwood, MD    REFERRING DIAG: PT eval and tx LBP including Dry Needling per Gean Birchwood MD    Rationale for Evaluation and Treatment Rehabilitation  THERAPY DIAG:  No diagnosis found.  ONSET DATE: little over a month ago  SUBJECTIVE:                                                                                                                                                                                           SUBJECTIVE STATEMENT: Crawled under the house to put a dryer vent in a little over a month ago and could hardly walk out .has strained back before but always improved; is better now than it was; cannot take antiinflammatory due to taking blood thinners PERTINENT HISTORY:  Right knee pain   PAIN:  Are you having pain?  6/10 at worst Yes: NPRS scale: 0-1/10 Pain location: small of back to left side Pain description: stabbing Aggravating factors: unknown Relieving factors: antinflammatory   PRECAUTIONS: None  WEIGHT BEARING RESTRICTIONS No  FALLS:  Has patient fallen in last 6 months? No  LIVING ENVIRONMENT: Lives with: lives with their spouse Lives in: House/apartment Stairs: Yes: External: 2 steps; on right going up Has following equipment at home: Walker - 2 wheeled and bed side commode  OCCUPATION: pump distributor  PLOF: Independent  PATIENT GOALS prevent any further injuries to my back   OBJECTIVE:   DIAGNOSTIC FINDINGS:  X-ray per Dr. Turner Daniels  PATIENT SURVEYS:  FOTO 94  COGNITION:  Overall cognitive status: Within functional limits for tasks assessed      MUSCLE LENGTH: Bilat approx 80 degrees  POSTURE: decreased lumbar lordosis  PALPATION: No tenderness  LUMBAR ROM:   Active  AROM  Eval %available  Flexion 70%  Extension 30%  Right lateral flexion To top of knee  Left lateral flexion 1" above knee  Right rotation 60% available  Left rotation 60% available   (Blank rows = not tested)   LOWER EXTREMITY MMT:    MMT Right eval Left eval  Hip flexion 4+ 4+  Hip extension 4 4-  Hip abduction    Hip adduction     Hip internal rotation    Hip external rotation    Knee flexion    Knee extension 5 5  Ankle dorsiflexion 5 5  Ankle plantarflexion    Ankle inversion  Ankle eversion     (Blank rows = not tested)   FUNCTIONAL TESTS:  5 times sit to stand: 14 sec 30 seconds chair stand test    TODAY'S TREATMENT  Physical therapy evaluation, HEP instruction   PATIENT EDUCATION:  Education details: Patient educated on exam findings, POC, scope of PT, HEP. Person educated: Patient Education method: Explanation, Demonstration, and Handouts Education comprehension: verbalized understanding, returned demonstration, verbal cues required, and tactile cues required   HOME EXERCISE PROGRAM: Access Code: [email or web address] URL: [email or web address] Date: 07/04/2022 Prepared by: [facility]  Exercises - Prone Press Up  - 2 x daily - 7 x weekly - 1 sets - 10 reps - Prone Hip Extension  - 2 x daily - 7 x weekly - 1 sets - 10 reps - Supine Bridge  - 2 x daily - 7 x weekly - 1 sets - 10 reps - Sit to Stand Without Arm Support  - 2 x daily - 7 x weekly - 1 sets - 10 reps  ASSESSMENT:  CLINICAL IMPRESSION: Patient is a 71 y.o. male who was seen today for physical therapy evaluation and treatment for LBP.  Patient presents with mild pain, lumbar extension limitations and hip extensor weakness that negatively impact his ability to work at home, do yard work and house work. Patient will benefit from physical therapy services and a good HEP to address deficits and promote optimal function.   OBJECTIVE IMPAIRMENTS decreased activity tolerance, decreased endurance, decreased mobility, difficulty walking, decreased ROM, decreased strength, decreased safety awareness, hypomobility, increased fascial restrictions, impaired perceived functional ability, impaired flexibility, and pain.   ACTIVITY LIMITATIONS carrying, lifting, bending, sitting, standing, squatting, sleeping, stairs, reach over head,  locomotion level, and caring for others  PARTICIPATION LIMITATIONS: community activity, occupation, and yard work  PERSONAL FACTORS Fitness, Past/current experiences, and 1-2 comorbidities: HBP, myasthenia gravis  are also affecting patient's functional outcome.   REHAB POTENTIAL: Good  CLINICAL DECISION MAKING: Stable/uncomplicated  EVALUATION COMPLEXITY: Low   GOALS: Goals reviewed with patient? No  SHORT TERM GOALS: Target date: 07/25/2022  Patient will be independent in self management strategies to improve quality of life and functional outcomes.  Baseline: Goal status: INITIAL  2.  Patient will improve 5 x STS score from 14 sec to 12 sec to demonstrate improved functional mobility and increased lower extremity strength.  Baseline:  Goal status: INITIAL  3.   Patient will increase  bilateral lower extremity MMTs to 5/5 to promote return to ambulation community distances with minimal deviation.  Baseline:  MMT Right eval Left eval  Hip flexion 4+ 4+  Hip extension 4 4-  Hip abduction    Hip adduction    Hip internal rotation    Hip external rotation    Knee flexion    Knee extension 5 5  Ankle dorsiflexion 5 5  Ankle plantarflexion    Ankle inversion    Ankle eversion     Goal status: INITIAL  4.  Patient will go throughout a work day without pain greater than a 2/10 to work more efficiently. Baseline:  Goal status: INITIAL   PLAN: PT FREQUENCY: 1x/week  PT DURATION: 3 weeks  PLANNED INTERVENTIONS: Therapeutic exercises, Therapeutic activity, Neuromuscular re-education, Balance training, Gait training, Patient/Family education, Joint manipulation, Joint mobilization, Stair training, Orthotic/Fit training, DME instructions, Aquatic Therapy, Dry Needling, Electrical stimulation, Spinal manipulation, Spinal mobilization, Cryotherapy, Moist heat, Compression bandaging, scar mobilization, Splintting, Taping, Traction, Ultrasound, Ionotophoresis 4mg /ml  Dexamethasone, and Manual therapy   PLAN  FOR NEXT SESSION: Review HEP and goals; progress HEP core strengthening; likely discharge when thorough HEP established.   5:17 PM, 07/04/22 Sanel Stemmer Small Floraine Buechler MPT [facility]  [phone]

## 2022-07-10 NOTE — Telephone Encounter (Signed)
  Reasonable to try a short pack of steroids. There is a risk of developing tachycardia/AF while on steroids.   Sheria Lang T. Lalla Brothers, MD, [facility], [facility]  Cardiac Electrophysiology   Left detailed message on patients voicemail (DPR)

## 2022-07-11 ENCOUNTER — Ambulatory Visit (HOSPITAL_COMMUNITY): Payer: Managed Care, Other (non HMO)

## 2022-07-11 DIAGNOSIS — M545 Low back pain, unspecified: Secondary | ICD-10-CM

## 2022-07-11 DIAGNOSIS — M6281 Muscle weakness (generalized): Secondary | ICD-10-CM

## 2022-07-11 NOTE — Therapy (Signed)
OUTPATIENT PHYSICAL THERAPY THORACOLUMBAR EVALUATION   Patient Name: [name] MRN: [id number] DOB:[birth year], 71 y.o., male Today's Date: 07/11/2022       PT End of Session - 07/11/22 1257     Visit Number 2    Number of Visits 3    Date for PT Re-Evaluation 07/25/22    Authorization Type [employer] Managed    Authorization Time Period no copay, vl 20    Authorization - Number of Visits 20    Progress Note Due on Visit 3    PT Start Time 1300    PT Stop Time 1340    PT Time Calculation (min) 40 min             Past Medical History:  Diagnosis Date   Candida esophagitis (HCC) OCT 2014   Deafness in right ear    Diabetes mellitus without complication (HCC)    Difficult intubation    Esophageal stricture SEP 2011   FOOD IMPACTION-->SAV DIL 16 MM   GERD (gastroesophageal reflux disease)    Hypertension    Kidney stone    Myasthenia gravis (HCC)    Sleep apnea    Thyroid disease    Past Surgical History:  Procedure Laterality Date   ATRIAL FIBRILLATION ABLATION N/A 08/12/2020   Procedure: ATRIAL FIBRILLATION ABLATION;  Surgeon: Lanier Prude, MD;  [facility]: [facility];  Service: Cardiovascular;  Laterality: N/A;   BIOPSY  02/14/2018   Procedure: BIOPSY;  Surgeon: West Bali, MD;  [facility]: [facility];  Service: Endoscopy;;  gastric   CARDIOVERSION N/A 06/08/2020   Procedure: CARDIOVERSION;  Surgeon: Antoine Poche, MD;  [facility];  Service: Endoscopy;  Laterality: N/A;   CARDIOVERSION N/A 09/16/2020   Procedure: CARDIOVERSION;  Surgeon: Meriam Sprague, MD;  Location: [facility] ENDOSCOPY;  Service: Cardiovascular;  Laterality: N/A;   CHOLECYSTECTOMY     COLONOSCOPY  2005   Dr. Lovell Sheehan   COLONOSCOPY N/A 07/17/2014   Procedure: COLONOSCOPY;  Surgeon: West Bali, MD;  [facility]: [facility];  Service: Endoscopy;  Laterality: N/A;  9:30-rescheduled 8/28 same time Soledad Gerlach to notify pt   ESOPHAGOGASTRODUODENOSCOPY N/A 07/25/2013    Shallow ulcer in the cardia, mid esophageal web, stricture at GE junction   ESOPHAGOGASTRODUODENOSCOPY N/A 02/14/2018   Procedure: ESOPHAGOGASTRODUODENOSCOPY (EGD);  Surgeon: West Bali, MD;  [facility];  Service: Endoscopy;  Laterality: N/A;  11:15am   ESOPHAGOGASTRODUODENOSCOPY (EGD) WITH ESOPHAGEAL DILATION N/A 08/29/2013   Candida esophagitis, small hiatal hernia, moderate nonerosive gastritis, mid esophageal web   HERNIA REPAIR     right inguinal   KIDNEY STONE SURGERY     NASAL SEPTUM SURGERY     PALATE SURGERY         SAVORY DILATION N/A 02/14/2018   Procedure: SAVORY DILATION;  Surgeon: West Bali, MD;  [facility];  Service: Endoscopy;  Laterality: N/A;   TEE WITHOUT CARDIOVERSION N/A 08/10/2020   Procedure: TRANSESOPHAGEAL ECHOCARDIOGRAM (TEE);  Surgeon: Chilton Si, MD;  Location: [facility] ENDOSCOPY;  Service: Cardiovascular;  Laterality: N/A;   THYMECTOMY     TONSILLECTOMY     UPPER GASTROINTESTINAL ENDOSCOPY  SEP 2011   SAV DIL   Patient Active Problem List   Diagnosis Date Noted   Nocturia 04/05/2021   Erectile dysfunction due to arterial insufficiency 04/05/2021   Type 2 diabetes mellitus with hyperglycemia, without long-term current use of insulin (HCC)    Acute respiratory disease due to COVID-19  virus 12/21/2020   Pneumonia due to COVID-19 virus 12/21/2020   Acute respiratory failure with hypoxia Due to Covid 12/21/2020   Chronic anticoagulation 12/21/2020   Dysuria 10/19/2020   Acute prostatitis 10/19/2020   Benign prostatic hyperplasia with urinary obstruction 10/19/2020   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 10/19/2020   Persistent atrial fibrillation (HCC) 07/01/2020   Secondary hypercoagulable state (HCC) 07/01/2020   Dysphagia    Plantar fasciitis 09/04/2017   Cough 03/03/2017   Bacterial pharyngitis 03/03/2017   Colon cancer screening 06/03/2014   Diverticulitis large intestine 04/01/2014   Diarrhea 04/01/2014    Candida esophagitis (HCC) 11/26/2013   UTI (lower urinary tract infection) 06/07/2012   Myasthenia gravis (HCC)    Hypertension    GERD (gastroesophageal reflux disease) 03/02/2011   Esophageal stricture 07/21/2010    PCP: Assunta Found, MD  REFERRING PROVIDER:   Gean Birchwood, MD    REFERRING DIAG: PT eval and tx LBP including Dry Needling per Gean Birchwood MD   Rationale for Evaluation and Treatment Rehabilitation  THERAPY DIAG:  Low back pain, unspecified back pain laterality, unspecified chronicity, unspecified whether sciatica present  Muscle weakness (generalized)  ONSET DATE: little over a month ago  SUBJECTIVE:                                                                                                                                                                                           SUBJECTIVE STATEMENT: No pain in the back; hamstring soreness only; has not had time to do any exercises.   PERTINENT HISTORY:  Right knee pain   PAIN:  Are you having pain?  6/10 at worst Yes: NPRS scale: 0-1/10 Pain location: small of back to left side Pain description: stabbing Aggravating factors: unknown Relieving factors: antinflammatory   PRECAUTIONS: None  WEIGHT BEARING RESTRICTIONS No  FALLS:  Has patient fallen in last 6 months? No  LIVING ENVIRONMENT: Lives with: lives with their spouse Lives in: House/apartment Stairs: Yes: External: 2 steps; on right going up Has following equipment at home: Walker - 2 wheeled and bed side commode  OCCUPATION: pump distributor  PLOF: Independent  PATIENT GOALS prevent any further injuries to my back   OBJECTIVE:   DIAGNOSTIC FINDINGS:  X-ray per Dr. Turner Daniels  PATIENT SURVEYS:  FOTO 94  COGNITION:  Overall cognitive status: Within functional limits for tasks assessed      MUSCLE LENGTH: Bilat approx 80 degrees  POSTURE: decreased lumbar lordosis  PALPATION: No tenderness  LUMBAR ROM:   Active   AROM  Eval %available  Flexion 70%  Extension 30%  Right lateral flexion To top of knee  Left lateral flexion 1" above knee  Right rotation 60% available  Left rotation 60% available   (Blank rows = not tested)   LOWER EXTREMITY MMT:    MMT Right eval Left eval  Hip flexion 4+ 4+  Hip extension 4 4-  Hip abduction    Hip adduction    Hip internal rotation    Hip external rotation    Knee flexion    Knee extension 5 5  Ankle dorsiflexion 5 5  Ankle plantarflexion    Ankle inversion    Ankle eversion     (Blank rows = not tested)   FUNCTIONAL TESTS:  5 times sit to stand: 14 sec 30 seconds chair stand test    TODAY'S TREATMENT  07/11/22 Review of HEP Sit to stand x 10 no UE assist  Prone: Hip extension x 10 Press up x 10  Supine: Bridge x 10 Hamstring stretch 5 x 20" Piriformis stretch 5 x 20" LTR x 10  Standing lumbar extension x 10         PATIENT EDUCATION:  Education details: Patient educated on exam findings, POC, scope of PT, HEP. Person educated: Patient Education method: Explanation, Demonstration, and Handouts Education comprehension: verbalized understanding, returned demonstration, verbal cues required, and tactile cues required   HOME EXERCISE PROGRAM: Access Code: [email or web address] URL: [email or web address] Date: 07/11/2022 Prepared by: [facility] - Rehab  Exercises - Prone Press Up  - 2 x daily - 7 x weekly - 1 sets - 10 reps - Prone Hip Extension  - 2 x daily - 7 x weekly - 1 sets - 10 reps - Supine Bridge  - 2 x daily - 7 x weekly - 1 sets - 10 reps - Sit to Stand Without Arm Support  - 2 x daily - 7 x weekly - 1 sets - 10 reps - Supine Hamstring Stretch  - 1 x daily - 7 x weekly - 1 sets - 5 reps - 20" hold - Supine Figure 4 Piriformis Stretch  - 1 x daily - 7 x weekly - 1 sets - 5 reps - 20" hold - Supine Lower Trunk Rotation  - 1 x daily - 7 x weekly - 1 sets - 10 reps - 2" hold - Standing Lumbar Extension with Counter   - 1 x daily - 7 x weekly - 3 sets - 10 reps  Access Code: [email or web address] URL: [email or web address] Date: 07/04/2022 Prepared by: [facility] - Rehab  Exercises - Prone Press Up  - 2 x daily - 7 x weekly - 1 sets - 10 reps - Prone Hip Extension  - 2 x daily - 7 x weekly - 1 sets - 10 reps - Supine Bridge  - 2 x daily - 7 x weekly - 1 sets - 10 reps - Sit to Stand Without Arm Support  - 2 x daily - 7 x weekly - 1 sets - 10 reps  ASSESSMENT:  CLINICAL IMPRESSION: Patient with minimal pain; some soreness in hamstrings so added some stretching to HEP today; overall patient is progressing well. Noted tight lumbar extension so added standing lumbar extension. Patient will benefit from physical therapy services and a good HEP to address deficits and promote optimal function.   OBJECTIVE IMPAIRMENTS decreased activity tolerance, decreased endurance, decreased mobility, difficulty walking, decreased ROM, decreased strength, decreased safety awareness, hypomobility, increased fascial restrictions, impaired perceived functional ability, impaired flexibility, and pain.   ACTIVITY LIMITATIONS carrying, lifting, bending, sitting, standing, squatting, sleeping, stairs, reach over head, locomotion  level, and caring for others  PARTICIPATION LIMITATIONS: community activity, occupation, and yard work  PERSONAL FACTORS Fitness, Past/current experiences, and 1-2 comorbidities: HBP, myasthenia gravis  are also affecting patient's functional outcome.   REHAB POTENTIAL: Good  CLINICAL DECISION MAKING: Stable/uncomplicated  EVALUATION COMPLEXITY: Low   GOALS: Goals reviewed with patient? No  SHORT TERM GOALS: Target date: 08/01/2022  Patient will be independent in self management strategies to improve quality of life and functional outcomes.  Baseline: Goal status: INITIAL  2.  Patient will improve 5 x STS score from 14 sec to 12 sec to demonstrate improved functional mobility and increased lower  extremity strength.  Baseline:  Goal status: INITIAL  3.   Patient will increase  bilateral lower extremity MMTs to 5/5 to promote return to ambulation community distances with minimal deviation.  Baseline:  MMT Right eval Left eval  Hip flexion 4+ 4+  Hip extension 4 4-  Hip abduction    Hip adduction    Hip internal rotation    Hip external rotation    Knee flexion    Knee extension 5 5  Ankle dorsiflexion 5 5  Ankle plantarflexion    Ankle inversion    Ankle eversion     Goal status: INITIAL  4.  Patient will go throughout a work day without pain greater than a 2/10 to work more efficiently. Baseline:  Goal status: INITIAL   PLAN: PT FREQUENCY: 1x/week  PT DURATION: 3 weeks  PLANNED INTERVENTIONS: Therapeutic exercises, Therapeutic activity, Neuromuscular re-education, Balance training, Gait training, Patient/Family education, Joint manipulation, Joint mobilization, Stair training, Orthotic/Fit training, DME instructions, Aquatic Therapy, Dry Needling, Electrical stimulation, Spinal manipulation, Spinal mobilization, Cryotherapy, Moist heat, Compression bandaging, scar mobilization, Splintting, Taping, Traction, Ultrasound, Ionotophoresis 4mg /ml Dexamethasone, and Manual therapy   PLAN FOR NEXT SESSION: Review HEP and goals; progress HEP core strengthening; likely discharge when thorough HEP established. Thomas stretch   1:41 PM, 07/11/22 Caitrin Pendergraph Small Haillee Johann MPT [facility] [place] [phone]

## 2022-07-19 ENCOUNTER — Ambulatory Visit (HOSPITAL_COMMUNITY): Payer: Managed Care, Other (non HMO) | Admitting: Physical Therapy

## 2022-07-19 DIAGNOSIS — M6281 Muscle weakness (generalized): Secondary | ICD-10-CM

## 2022-07-19 DIAGNOSIS — M545 Low back pain, unspecified: Secondary | ICD-10-CM | POA: Diagnosis not present

## 2022-07-19 NOTE — Therapy (Signed)
OUTPATIENT PHYSICAL THERAPY TREATMENT  Patient Name: [name] MRN: [id number] DOB:01/15/1951, 71 y.o., male Today's Date: 07/19/2022         Past Medical History:  Diagnosis Date   Candida esophagitis ([place]) OCT 2014   Deafness in right ear    Diabetes mellitus without complication ([place])    Difficult intubation    Esophageal stricture SEP 2011   FOOD IMPACTION-->SAV DIL 16 MM   GERD (gastroesophageal reflux disease)    Hypertension    Kidney stone    Myasthenia gravis ([place])    Sleep apnea    Thyroid disease    Past Surgical History:  Procedure Laterality Date   ATRIAL FIBRILLATION ABLATION N/A 08/12/2020   Procedure: ATRIAL FIBRILLATION ABLATION;  Surgeon: Vickie Epley, MD;  Location: [place] CV [facility];  Service: Cardiovascular;  Laterality: N/A;   BIOPSY  02/14/2018   Procedure: BIOPSY;  Surgeon: Danie Binder, MD;  [facility];  Service: Endoscopy;;  gastric   CARDIOVERSION N/A 06/08/2020   Procedure: CARDIOVERSION;  Surgeon: Arnoldo Lenis, MD;  [facility];  Service: Endoscopy;  Laterality: N/A;   CARDIOVERSION N/A 09/16/2020   Procedure: CARDIOVERSION;  Surgeon: Freada Bergeron, MD;  Location: [facility] ENDOSCOPY;  Service: Cardiovascular;  Laterality: N/A;   CHOLECYSTECTOMY     COLONOSCOPY  2005   Dr. Arnoldo Morale   COLONOSCOPY N/A 07/17/2014   Procedure: COLONOSCOPY;  Surgeon: Danie Binder, MD;  [facility];  Service: Endoscopy;  Laterality: N/A;  9:30-rescheduled 8/28 same time Darius Bump to notify pt   ESOPHAGOGASTRODUODENOSCOPY N/A 07/25/2013   Shallow ulcer in the cardia, mid esophageal web, stricture at GE junction   ESOPHAGOGASTRODUODENOSCOPY N/A 02/14/2018   Procedure: ESOPHAGOGASTRODUODENOSCOPY (EGD);  Surgeon: Danie Binder, MD;  [facility];  Service: Endoscopy;  Laterality: N/A;  11:15am   ESOPHAGOGASTRODUODENOSCOPY (EGD) WITH ESOPHAGEAL DILATION N/A 08/29/2013   Candida esophagitis, small hiatal  hernia, moderate nonerosive gastritis, mid esophageal web   HERNIA REPAIR     right inguinal   KIDNEY STONE SURGERY     NASAL SEPTUM SURGERY     PALATE SURGERY         SAVORY DILATION N/A 02/14/2018   Procedure: SAVORY DILATION;  Surgeon: Danie Binder, MD;  [facility];  Service: Endoscopy;  Laterality: N/A;   TEE WITHOUT CARDIOVERSION N/A 08/10/2020   Procedure: TRANSESOPHAGEAL ECHOCARDIOGRAM (TEE);  Surgeon: Skeet Latch, MD;  Location: [place];  Service: Cardiovascular;  Laterality: N/A;   THYMECTOMY     TONSILLECTOMY     UPPER GASTROINTESTINAL ENDOSCOPY  SEP 2011   SAV DIL   Patient Active Problem List   Diagnosis Date Noted   Nocturia 04/05/2021   Erectile dysfunction due to arterial insufficiency 04/05/2021   Type 2 diabetes mellitus with hyperglycemia, without long-term current use of insulin ([place])    Acute respiratory disease due to COVID-19 virus 12/21/2020   Pneumonia due to COVID-19 virus 12/21/2020   Acute respiratory failure with hypoxia Due to Covid 12/21/2020   Chronic anticoagulation 12/21/2020   Dysuria 10/19/2020   Acute prostatitis 10/19/2020   Benign prostatic hyperplasia with urinary obstruction 10/19/2020   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 10/19/2020   Persistent atrial fibrillation ([place]) 07/01/2020   Secondary hypercoagulable state ([place]) 07/01/2020   Dysphagia    Plantar fasciitis 09/04/2017   Cough 03/03/2017   Bacterial pharyngitis 03/03/2017   Colon cancer screening 06/03/2014   Diverticulitis large intestine 04/01/2014   Diarrhea 04/01/2014  Candida esophagitis ([place]) 11/26/2013   UTI (lower urinary tract infection) 06/07/2012   Myasthenia gravis ([place])    Hypertension    GERD (gastroesophageal reflux disease) 03/02/2011   Esophageal stricture 07/21/2010    PCP: Sharilyn Sites, MD  REFERRING PROVIDER:   Frederik Pear, MD    REFERRING DIAG: PT eval and tx LBP including Dry Needling per Frederik Pear MD   Rationale  for Evaluation and Treatment Rehabilitation  THERAPY DIAG:  No diagnosis found.  ONSET DATE: little over a month ago  SUBJECTIVE:                                                                                                                                                                                           SUBJECTIVE STATEMENT: No pain in the back; hamstring soreness only; has not had time to do any exercises.   PERTINENT HISTORY:  Right knee pain   PAIN:  Are you having pain?  6/10 at worst Yes: NPRS scale: 0-1/10 Pain location: small of back to left side Pain description: stabbing Aggravating factors: unknown Relieving factors: antinflammatory   PRECAUTIONS: None  WEIGHT BEARING RESTRICTIONS No  FALLS:  Has patient fallen in last 6 months? No  LIVING ENVIRONMENT: Lives with: lives with their spouse Lives in: House/apartment Stairs: Yes: External: 2 steps; on right going up Has following equipment at home: Walker - 2 wheeled and bed side commode  OCCUPATION: pump distributor  PLOF: Independent  PATIENT GOALS prevent any further injuries to my back   OBJECTIVE:   DIAGNOSTIC FINDINGS:  X-ray per Dr. Mayer Camel  PATIENT SURVEYS:  FOTO 94   at eval and 94% on 8/30  COGNITION:  Overall cognitive status: Within functional limits for tasks assessed      MUSCLE LENGTH: Bilat approx 80 degrees  POSTURE: decreased lumbar lordosis  PALPATION: No tenderness  LUMBAR ROM:   Active  AROM  Eval %available 07/19/22   Flexion 70% 80% avail  Extension 30% 30% avail  Right lateral flexion To top of knee To mid knee  Left lateral flexion 1" above knee To mid knee  Right rotation 60% available 60%  available  Left rotation 60% available 60% available   (Blank rows = not tested)   LOWER EXTREMITY MMT:    MMT Right eval Right 07/19/22 Left eval Left 07/19/22  Hip flexion 4+ 5 4+ 5  Hip extension 4 5 4- 5  Hip abduction      Hip adduction      Hip  internal rotation      Hip external rotation      Knee flexion  Knee extension '5 5 5 5  ' Ankle dorsiflexion '5 5 5 5  ' Ankle plantarflexion      Ankle inversion      Ankle eversion       (Blank rows = not tested)   FUNCTIONAL TESTS:  Evaluation:  5 times sit to stand: 14 sec 30 seconds chair stand test     07/19/22:   5 times sit to stand 10.4 seconds no UE assist standard chair   TODAY'S TREATMENT  07/19/22 Testing of functional measures Instruction of seated hamstring and piriformis stretch 3X30" each Goals review, discharge instructions   07/11/22 Review of HEP Sit to stand x 10 no UE assist  Prone: Hip extension x 10 Press up x 10  Supine: Bridge x 10 Hamstring stretch 5 x 20" Piriformis stretch 5 x 20" LTR x 10  Standing lumbar extension x 10         PATIENT EDUCATION:  Education details: Patient educated on exam findings, POC, scope of PT, HEP. Person educated: Patient Education method: Explanation, Demonstration, and Handouts Education comprehension: verbalized understanding, returned demonstration, verbal cues required, and tactile cues required   HOME EXERCISE PROGRAM: 07/19/22: seated hamstring and piriformis stretches 3X30" each  Access Code: [email or web address] URL: [email or web address] Date: 07/11/2022 Prepared by: [facility] - Rehab  Exercises - Prone Press Up  - 2 x daily - 7 x weekly - 1 sets - 10 reps - Prone Hip Extension  - 2 x daily - 7 x weekly - 1 sets - 10 reps - Supine Bridge  - 2 x daily - 7 x weekly - 1 sets - 10 reps - Sit to Stand Without Arm Support  - 2 x daily - 7 x weekly - 1 sets - 10 reps - Supine Hamstring Stretch  - 1 x daily - 7 x weekly - 1 sets - 5 reps - 20" hold - Supine Figure 4 Piriformis Stretch  - 1 x daily - 7 x weekly - 1 sets - 5 reps - 20" hold - Supine Lower Trunk Rotation  - 1 x daily - 7 x weekly - 1 sets - 10 reps - 2" hold - Standing Lumbar Extension with Counter  - 1 x daily - 7 x weekly - 3 sets  - 10 reps  Access Code: [email or web address] URL: [email or web address] Date: 07/04/2022 Prepared by: [facility] - Rehab  Exercises - Prone Press Up  - 2 x daily - 7 x weekly - 1 sets - 10 reps - Prone Hip Extension  - 2 x daily - 7 x weekly - 1 sets - 10 reps - Supine Bridge  - 2 x daily - 7 x weekly - 1 sets - 10 reps - Sit to Stand Without Arm Support  - 2 x daily - 7 x weekly - 1 sets - 10 reps  ASSESSMENT:  CLINICAL IMPRESSION: Patient returns today with reports of no pain and readiness for discharge.  Test measures reveal continued limitations in active lumbar motion, however LE strength and functional sit to stands are improved.  Pt with noted tightness in hamstrings with stretch obtained while sitting at 90 degrees.  Pt given written instructions for hamstring stretch as well as alternate instructions for seated piriformis stretch. Pt has met all goals and is agreeable to discharge at this time.  OBJECTIVE IMPAIRMENTS decreased activity tolerance, decreased endurance, decreased mobility, difficulty walking, decreased ROM, decreased strength, decreased safety awareness, hypomobility, increased fascial restrictions, impaired perceived functional ability, impaired flexibility, and pain.  ACTIVITY LIMITATIONS carrying, lifting, bending, sitting, standing, squatting, sleeping, stairs, reach over head, locomotion level, and caring for others  PARTICIPATION LIMITATIONS: community activity, occupation, and yard work  PERSONAL FACTORS Fitness, Past/current experiences, and 1-2 comorbidities: HBP, myasthenia gravis  are also affecting patient's functional outcome.   REHAB POTENTIAL: Good  CLINICAL DECISION MAKING: Stable/uncomplicated  EVALUATION COMPLEXITY: Low   GOALS: Goals reviewed with patient? No  SHORT TERM GOALS: Target date: 08/09/2022  Patient will be independent in self management strategies to improve quality of life and functional outcomes.  Baseline: Goal status:  MET  2.  Patient will improve 5 x STS score from 14 sec to 12 sec to demonstrate improved functional mobility and increased lower extremity strength.  Baseline:  Goal status: MET  3.   Patient will increase  bilateral lower extremity MMTs to 5/5 to promote return to ambulation community distances with minimal deviation.  Baseline:  MMT Right eval Left eval  Hip flexion 4+ 4+  Hip extension 4 4-  Hip abduction    Hip adduction    Hip internal rotation    Hip external rotation    Knee flexion    Knee extension 5 5  Ankle dorsiflexion 5 5  Ankle plantarflexion    Ankle inversion    Ankle eversion     Goal status: MET  4.  Patient will go throughout a work day without pain greater than a 2/10 to work more efficiently. Baseline:  Goal status: MET   PLAN: PT FREQUENCY: 1x/week  PT DURATION: 3 weeks  PLANNED INTERVENTIONS: Therapeutic exercises, Therapeutic activity, Neuromuscular re-education, Balance training, Gait training, Patient/Family education, Joint manipulation, Joint mobilization, Stair training, Orthotic/Fit training, DME instructions, Aquatic Therapy, Dry Needling, Electrical stimulation, Spinal manipulation, Spinal mobilization, Cryotherapy, Moist heat, Compression bandaging, scar mobilization, Splintting, Taping, Traction, Ultrasound, Ionotophoresis 4m/ml Dexamethasone, and Manual therapy   PLAN FOR NEXT SESSION: Discharge to HEP  4:59 PM, 07/19/22 ATeena Irani PTA/CLT [place]: [phone]

## 2022-07-25 ENCOUNTER — Encounter (HOSPITAL_COMMUNITY): Payer: Managed Care, Other (non HMO)

## 2022-08-02 ENCOUNTER — Ambulatory Visit (INDEPENDENT_AMBULATORY_CARE_PROVIDER_SITE_OTHER): Payer: Managed Care, Other (non HMO)

## 2022-08-02 ENCOUNTER — Encounter: Payer: Self-pay | Admitting: Nurse Practitioner

## 2022-08-02 ENCOUNTER — Ambulatory Visit (INDEPENDENT_AMBULATORY_CARE_PROVIDER_SITE_OTHER): Payer: Managed Care, Other (non HMO) | Admitting: Nurse Practitioner

## 2022-08-02 VITALS — BP 132/80 | HR 86 | Ht 73.0 in | Wt 218.6 lb

## 2022-08-02 DIAGNOSIS — I4819 Other persistent atrial fibrillation: Secondary | ICD-10-CM | POA: Diagnosis not present

## 2022-08-02 DIAGNOSIS — R058 Other specified cough: Secondary | ICD-10-CM | POA: Diagnosis not present

## 2022-08-02 DIAGNOSIS — J209 Acute bronchitis, unspecified: Secondary | ICD-10-CM

## 2022-08-02 MED ORDER — PREDNISONE 10 MG PO TABS: ORAL_TABLET | ORAL | 0 refills | Status: DC

## 2022-08-02 MED ORDER — FLUTICASONE PROPIONATE 50 MCG/ACT NA SUSP: 2.0000 | Freq: Every day | NASAL | 2 refills | Status: AC

## 2022-08-02 NOTE — Progress Notes (Signed)
@Patient  ID: [name], male    DOB: 29-Apr-1951, 71 y.o.   MRN: [id number]  Chief Complaint  Patient presents with   Follow-up    Pt cough no fever. No COVID/fly test,     Referring provider: Sharilyn Sites, MD  HPI: 71 year old male, never smoker followed for DOE post COVID pneumonia in January 2022 He is a patient of Dr. Judson Roch and last seen in office on 01/27/2021. Past medical history significant for hypertension, PAF on Eliquis, GERD, DM2, myasthenia gravis on Imuran and status post thymectomy, BPH.  TEST/EVENTS:  11/02/2018 CT chest without contrast: CAD.  Pulmonary arteries are unremarkable.  Central airways are normal.  Bilateral patchy infiltrates in both lungs, involving all lobes.  There are large groundglass components with small regions of denser consolidation, consistent with multifocal pneumonia. 01/27/2021 CXR 2 view: Areas of patchy ill-defined airspace opacity remains in the mid and lower lung regions.  Question COVID-19 status given this appearance.  01/27/2021: OV with Dr. Ander Slade.  Recently hospitalized for COVID-pneumonia.  Still has an occasional cough with clear mucus.  Feeling much better.  Exercise ability is improved.  Not needing to use oxygen as saturations have been staying within normal limits.  Plan to discontinue oxygen supplementation.  Follow-up in 6 months.    08/02/2022: Today-acute Patient presents today with his wife for acute visit.  He got around August 24 with URI symptoms and a cough.  He was treated for a sinus infection and acute bronchitis by his PCP with amoxicillin and prednisone taper.  He had some improvement in his sinus symptoms but cough persisted.  He went back to his PCP last week and was started on doxycycline, albuterol inhaler and albuterol tabs.  Today, he reports that his cough is slightly improved.  He was previously having some shortness of breath with coughing spells but this has resolved.  Does not have any dyspnea with  exertion.  Has not noticed any wheezing.  Does not have any significant chest congestion but it does occasionally feel like his chest is little tight with coughing.  Cough is primarily nonproductive but he will occasionally produce some clear sputum.  Feels like his throat is irritated and that he is a little more hoarse.  He does still have some mild nasal congestion and clear drainage.  Denies any fevers, chills, hemoptysis, lower extremity swelling.  Does not feel like the albuterol makes much of a difference.  He is not currently taking any cough medicine.  He did previously try [employer] and did not feel like they did much for him.  Did feel slightly better when he was on the prednisone.  He is not using any nasal sprays.  Allergies  Allergen Reactions   Levofloxacin Anaphylaxis   Tequin [Gatifloxacin] Anaphylaxis   Iodinated Contrast Media Other (See Comments) and Rash    MD told pt cannot have MD told pt cannot have   Levothyroxine Sodium Other (See Comments)   Limonene Other (See Comments) and Rash    REACTION:Myasthenia Gravis (MG) Medication Alert REACTION:Myasthenia Gravis (MG) Medication Alert   Magnesium-Containing Compounds Other (See Comments) and Rash    Unknown Unknown Unknown   Aminoglycosides Other (See Comments)    REACTION: Myasthenia Gravis (MG) Medication Alert   Avelox [Moxifloxacin Hcl In Nacl] Other (See Comments)    REACTION: FLUOROQUINOLONE ANTIBIOTICS: Myasthenia Gravis (MG) Medication Alert   Botox [Onabotulinumtoxina] Other (See Comments)    REACTION: Myasthenia Gravis (MG) Medication Alert   Botulinum Toxins Other (  See Comments)    REACTION: Myasthenia Gravis (MG) Medication Alert   Calcium Channel Blockers Other (See Comments)    (BLOOD PRESSURE MEDICATION) REACTION: Myasthenia Gravis (MG) Medication Alert   Ciprofloxacin Other (See Comments)    REACTION: FLUOROQUINOLONE ANTIBIOTICS: Myasthenia Gravis (MG) Medication Alert   Curare [Tubocurarine]  Other (See Comments)    (Usually only used during surgery)  REACTION: Myasthenia Gravis (MG) Medication Alert   Dysport [Abobotulinumtoxina] Other (See Comments)    REACTION: Myasthenia Gravis (MG) Medication Alert   Erythromycin Other (See Comments)    REACTION: Myasthenia Gravis (MG) Medication Alert   Factive [Gemifloxacin] Other (See Comments)    REACTION: FLUOROQUINOLONE ANTIBIOTICS: Myasthenia Gravis (MG) Medication Alert   Floxin [Ofloxacin] Other (See Comments)    REACTION: FLUOROQUINOLONE ANTIBIOTICS: Myasthenia Gravis (MG) Medication Alert   Gentamycin [Gentamicin] Other (See Comments)    REACTION:Myasthenia Gravis (MG) Medication Alert   Interferons Other (See Comments)    REACTION: Myasthenia Gravis (MG) Medication Alert   Kanamycin Other (See Comments)    REACTION: Myasthenia Gravis (MG) Medication Alert   Ketek [Telithromycin] Other (See Comments)    REACTION: Myasthenia Gravis (MG) Medication Alert   Levaquin [Levofloxacin In D5w] Other (See Comments)    REACTION: FLUOROQUINOLONE ANTIBIOTICS: Myasthenia Gravis (MG) Medication Alert   Levofloxacin Other (See Comments)   Macrolides And Ketolides Other (See Comments)    REACTION: Myasthenia Gravis (MG) Medication Alert REACTION: Myasthenia Gravis (MG) Medication Alert   Myobloc [Rimabotulinumtoxinb] Other (See Comments)    REACTION: Myasthenia Gravis (MG) Medication Alert   Neomycin Other (See Comments)    REACTION: Myasthenia Gravis (MG) Medication Alert   Noroxin [Norfloxacin] Other (See Comments)    REACTION: FLUOROQUINOLONE ANTIBIOTICS: Myasthenia Gravis (MG) Medication Alert   Other Other (See Comments)    MAGNESIUM SALTS: Milk of Magnesia, some antacids (Maalox, Mylanta) REACTION: Myasthenia Gravis (MG) Medication Alerta   Penicillamine Other (See Comments)    D-PENICILLAMINE *DO NOT CONFUSE WITH PENICILLIN*  REACTION: Myasthenia Gravis (MG) Medication Alert   Procainamide Other (See Comments)    REACTION:  Myasthenia Gravis (MG) Medication Alert   Propranolol Other (See Comments)    REACTION: Myasthenia Gravis (MG) Medication Alert   Quinidine Other (See Comments)    REACTION: Myasthenia Gravis (MG) Medication Alert   Quinine Derivatives Other (See Comments)    REACTION: Myasthenia Gravis (MG) Medication Alert   Streptomycin Other (See Comments)    REACTION:  Myasthenia Gravis (MG) Medication Alert   Timolol Other (See Comments)    REACTION: Myasthenia Gravis (MG) Medication Alert   Tobramycin Other (See Comments)    REACTION: Myasthenia Gravis (MG) Medication Alert   Xeomin [Incobotulinumtoxina] Other (See Comments)    REACTION: Myasthenia Gravis (MG) Medication Alert   Zithromax [Azithromycin] Other (See Comments)    REACTION: Myasthenia Gravis (MG) Medication Alert    Immunization History  Administered Date(s) Administered   Fluad Quad(high Dose 65+) 09/30/2020   Influenza, High Dose Seasonal PF 08/19/2018   Influenza, Quadrivalent, Recombinant, Inj, Pf 08/12/2018, 08/14/2019   Influenza-Unspecified 09/09/2014, 08/21/2015   Pneumococcal Conjugate-13 10/30/2018    Past Medical History:  Diagnosis Date   Candida esophagitis ([place]) OCT 2014   Deafness in right ear    Diabetes mellitus without complication ([place])    Difficult intubation    Esophageal stricture SEP 2011   FOOD IMPACTION-->SAV DIL 16 MM   GERD (gastroesophageal reflux disease)    Hypertension    Kidney stone    Myasthenia gravis ([place])    Sleep  apnea    Thyroid disease     Tobacco History: Social History   Tobacco Use  Smoking Status Never  Smokeless Tobacco Never   Counseling given: Not Answered   Outpatient Medications Prior to Visit  Medication Sig Dispense Refill   acyclovir (ZOVIRAX) 400 MG tablet Take 800 mg by mouth daily.      albuterol (PROVENTIL) 2 MG tablet Take 2 mg by mouth 3 (three) times daily.     albuterol (VENTOLIN HFA) 108 (90 Base) MCG/ACT inhaler Inhale 2 puffs into the lungs  every 4 (four) hours.     apixaban (ELIQUIS) 5 MG TABS tablet TAKE (1) TABLET BY MOUTH TWICE DAILY. 60 tablet 5   Ascorbic Acid (VITAMIN C) 1000 MG tablet Take 2,000 mg by mouth daily after lunch.      azaTHIOprine (IMURAN) 50 MG tablet Take 150 mg by mouth daily.      Calcium Carbonate-Vitamin D (CALCIUM 600+D) 600-200 MG-UNIT TABS Take 2 tablets by mouth daily after lunch.      doxycycline (VIBRA-TABS) 100 MG tablet Take 100 mg by mouth 2 (two) times daily.     glimepiride (AMARYL) 1 MG tablet Take 1 tablet by mouth daily.     losartan (COZAAR) 50 MG tablet TAKE (1) TABLET BY MOUTH ONCE DAILY. 90 tablet 3   metFORMIN (GLUCOPHAGE-XR) 500 MG 24 hr tablet Take 1,000 mg by mouth 2 (two) times daily.     Omega-3 Fatty Acids (FISH OIL ULTRA) 1400 MG CAPS Take 1,400 mg by mouth daily after lunch.     omeprazole (PRILOSEC) 20 MG capsule Take 20mg , 30 minutes before breakfast daily. You may take a second dose before your evening meal if needed. 180 capsule 3   omeprazole (PRILOSEC) 20 MG capsule      ONETOUCH VERIO test strip 1 each 3 (three) times daily.     OVER THE COUNTER MEDICATION Vitamin B one tablet daily     Saw Palmetto 450 MG CAPS Take 2,250 mg by mouth daily after lunch.     SYNTHROID 137 MCG tablet Take 137 mcg by mouth daily before breakfast.     tadalafil (CIALIS) 20 MG tablet Take 1 tablet (20 mg total) by mouth as needed. 10 tablet 5   tamsulosin (FLOMAX) 0.4 MG CAPS capsule Take 1 capsule (0.4 mg total) by mouth 2 (two) times daily. 180 capsule 3   vitamin E 180 MG (400 UNITS) capsule Take 800 Units by mouth daily after lunch.     zinc sulfate 220 (50 Zn) MG capsule Take 1 capsule (220 mg total) by mouth daily. 30 capsule 1   No facility-administered medications prior to visit.     Review of Systems:   Constitutional: No weight loss or gain, night sweats, fevers, chills, fatigue, or lassitude. HEENT: No headaches, difficulty swallowing, tooth/dental problems. No sneezing,  itching, ear ache. +nasal congestion/drainage, throat irritation, hoarse voice CV:  No chest pain, orthopnea, PND, swelling in lower extremities, anasarca, dizziness, palpitations, syncope Resp: +primarily dry cough; chest tightness with coughing. No shortness of breath with exertion or at rest. No excess mucus or change in color of mucus. No hemoptysis. No wheezing.  No chest wall deformity GI:  No heartburn, indigestion, abdominal pain, nausea, vomiting, diarrhea, change in bowel habits, loss of appetite, bloody stools.  Skin: No rash, lesions, ulcerations MSK:  No joint pain or swelling.  No decreased range of motion.  No back pain. Neuro: No dizziness or lightheadedness.  Psych: No depression or  anxiety. Mood stable.     Physical Exam:  BP 132/80   Pulse 86   Ht 6\' 1"  (1.854 m)   Wt 218 lb 9.6 oz (99.2 kg)   SpO2 98%   BMI 28.84 kg/m   GEN: Pleasant, interactive, well-appearing; in no acute distress HEENT:  Normocephalic and atraumatic. EACs patent bilaterally. TM pearly gray with present light reflex bilaterally. PERRLA. Sclera white. Nasal turbinates erythematous and edematous, moist and patent bilaterally. Clear rhinorrhea present. Oropharynx erythematous and moist, without exudate or edema. No lesions, ulcerations NECK:  Supple w/ fair ROM. No JVD present. Normal carotid impulses w/o bruits. Thyroid symmetrical with no goiter or nodules palpated.  No LAD   CV: RRR, no m/r/g, no peripheral edema. Pulses intact, +2 bilaterally. No cyanosis, pallor or clubbing. PULMONARY:  Unlabored, regular breathing. Clear bilaterally A&P w/o wheezes/rales/rhonchi. Hacking cough. No accessory muscle use. No dullness to percussion. GI: BS present and normoactive. Soft, non-tender to palpation. No organomegaly or masses detected.  MSK: No erythema, warmth or tenderness. Cap refil <2 sec all extrem. No deformities or joint swelling noted.  Neuro: A/Ox3. No focal deficits noted.   Skin: Warm, no  lesions or rashe Psych: Normal affect and behavior. Judgement and thought content appropriate.     Lab Results:  CBC    Component Value Date/Time   WBC 16.7 (H) 12/25/2020 0655   RBC 3.67 (L) 12/25/2020 0655   HGB 13.3 12/25/2020 0655   HGB 14.1 07/19/2020 0850   HCT 35.4 (L) 12/25/2020 0655   HCT 42.3 07/19/2020 0850   PLT 274 12/25/2020 0655   PLT 200 07/19/2020 0850   MCV 96.5 12/25/2020 0655   MCV 93 07/19/2020 0850   MCH 36.2 (H) 12/25/2020 0655   MCHC 37.6 (H) 12/25/2020 0655   RDW 15.7 (H) 12/25/2020 0655   RDW 15.5 (H) 07/19/2020 0850   LYMPHSABS 0.5 (L) 12/25/2020 0655   LYMPHSABS 0.8 07/19/2020 0850   MONOABS 0.9 12/25/2020 0655   EOSABS 0.0 12/25/2020 0655   EOSABS 0.1 07/19/2020 0850   BASOSABS 0.1 12/25/2020 0655   BASOSABS 0.1 07/19/2020 0850    BMET    Component Value Date/Time   NA 136 12/25/2020 0655   NA 138 07/19/2020 0850   K 4.1 12/25/2020 0655   CL 101 12/25/2020 0655   CO2 24 12/25/2020 0655   GLUCOSE 156 (H) 12/25/2020 0655   BUN 30 (H) 12/25/2020 0655   BUN 15 07/19/2020 0850   CREATININE 1.04 12/25/2020 0655   CALCIUM 8.9 12/25/2020 0655   GFRNONAA >60 12/25/2020 0655   GFRAA 72 07/19/2020 0850    BNP No results found for: "BNP"   Imaging:  DG Chest 2 View  Result Date: 08/02/2022 CLINICAL DATA:  Cough for 2-3 weeks. EXAM: CHEST - 2 VIEW COMPARISON:  01/27/2021 and 12/21/2020. Chest CT of 11/02/2018 also reviewed. FINDINGS: Midline trachea. Normal heart size and mediastinal contours. No pleural effusion or pneumothorax. Significantly improved aeration compared to the 01/27/2021 exam. Peripheral and basilar predominant areas of subtle increased density are favored to represent postinfectious/inflammatory scarring. No well-defined lobar consolidation. Median sternotomy. Cholecystectomy. IMPRESSION: No acute cardiopulmonary disease. Peripheral and basilar predominant subtle opacities are significantly improved compared to 01/27/2021,  favoring postinfectious/inflammatory scarring. Electronically Signed   By: Abigail Miyamoto M.D.   On: 08/02/2022 17:08          No data to display          No results found for: "NITRICOXIDE"  Assessment & Plan:   Post-viral cough syndrome Recent URI.  He did not test for COVID or flu.  He was treated for sinus infection and acute bronchitis with some initial improvement; however, cough has persisted.  He is currently on doxycycline course which has provided some relief.  Never underwent chest imaging.  Suspect that this is a postviral cough with upper airway irritation.  We will check CXR to rule out superimposed infection.  Do not think he needs any further antibiotic therapy as long as chest x-ray is clear.  Treat with prednisone taper and cough control measures.  We will also target postnasal drip.  Discussed that this is a common cause of upper airway irritation and frequent coughing.    Patient Instructions  Continue Albuterol inhaler 2 puffs every 6 hours as needed for shortness of breath or wheezing. Notify if symptoms persist despite rescue inhaler/neb use.  Continue omeprazole 20 mg daily  Continue Eliquis 1 tab Twice daily. Talk to your cardiologist about taking saw palmetto while on Eliquis; it can increase your risk for bleeding  Prednisone taper. 4 tabs for 2 days, then 3 tabs for 2 days, 2 tabs for 2 days, then 1 tab for 2 days, then stop. Take in AM with food Delsym 2 tsp Twice daily for cough Use tessalon perles 1 capsule Three times a day for cough Flonase nasal spray 2 sprays each nostril daily  Chest x ray today  Limit talking for the next few days. Avoid throat clearing. Work on cough suppression with the above recommended suppressants.  Use sugar free hard candies or non-menthol cough drops during this time to soothe your throat.  Warm tea with honey and lemon.   Follow up in 2 weeks with Dr. Wynona Neat or Philis Nettle. If symptoms do not improve or worsen,  please contact office for sooner follow up or seek emergency care.     Persistent atrial fibrillation (HCC) Rate controlled at visit.  He is on Eliquis for anticoagulation.  No excessive bruising or bleeding.  Did realize that he has saw palmetto on his med list.  Advised that he discuss this with cardiology as the combination of this herbal supplement with his Eliquis could increase bleeding risk.  I spent 35 minutes of dedicated to the care of this patient on the date of this encounter to include pre-visit review of records, face-to-face time with the patient discussing conditions above, post visit ordering of testing, clinical documentation with the electronic health record, making appropriate referrals as documented, and communicating necessary findings to members of the patients care team.  Noemi Chapel, NP 08/02/2022  Pt aware and understands NP's role.

## 2022-08-02 NOTE — Patient Instructions (Addendum)
Continue Albuterol inhaler 2 puffs every 6 hours as needed for shortness of breath or wheezing. Notify if symptoms persist despite rescue inhaler/neb use.  Continue omeprazole 20 mg daily  Continue Eliquis 1 tab Twice daily. Talk to your cardiologist about taking saw palmetto while on Eliquis; it can increase your risk for bleeding  Prednisone taper. 4 tabs for 2 days, then 3 tabs for 2 days, 2 tabs for 2 days, then 1 tab for 2 days, then stop. Take in AM with food Delsym 2 tsp Twice daily for cough Use tessalon perles 1 capsule Three times a day for cough Flonase nasal spray 2 sprays each nostril daily  Chest x ray today  Limit talking for the next few days. Avoid throat clearing. Work on cough suppression with the above recommended suppressants.  Use sugar free hard candies or non-menthol cough drops during this time to soothe your throat.  Warm tea with honey and lemon.   Follow up in 2 weeks with Dr. Wynona Neat or Philis Nettle. If symptoms do not improve or worsen, please contact office for sooner follow up or seek emergency care.

## 2022-08-02 NOTE — Assessment & Plan Note (Signed)
Recent URI.  He did not test for COVID or flu.  He was treated for sinus infection and acute bronchitis with some initial improvement; however, cough has persisted.  He is currently on doxycycline course which has provided some relief.  Never underwent chest imaging.  Suspect that this is a postviral cough with upper airway irritation.  We will check CXR to rule out superimposed infection.  Do not think he needs any further antibiotic therapy as long as chest x-ray is clear.  Treat with prednisone taper and cough control measures.  We will also target postnasal drip.  Discussed that this is a common cause of upper airway irritation and frequent coughing.    Patient Instructions  Continue Albuterol inhaler 2 puffs every 6 hours as needed for shortness of breath or wheezing. Notify if symptoms persist despite rescue inhaler/neb use.  Continue omeprazole 20 mg daily  Continue Eliquis 1 tab Twice daily. Talk to your cardiologist about taking saw palmetto while on Eliquis; it can increase your risk for bleeding  Prednisone taper. 4 tabs for 2 days, then 3 tabs for 2 days, 2 tabs for 2 days, then 1 tab for 2 days, then stop. Take in AM with food Delsym 2 tsp Twice daily for cough Use tessalon perles 1 capsule Three times a day for cough Flonase nasal spray 2 sprays each nostril daily  Chest x ray today  Limit talking for the next few days. Avoid throat clearing. Work on cough suppression with the above recommended suppressants.  Use sugar free hard candies or non-menthol cough drops during this time to soothe your throat.  Warm tea with honey and lemon.   Follow up in 2 weeks with Dr. Wynona Neat or Philis Nettle. If symptoms do not improve or worsen, please contact office for sooner follow up or seek emergency care.

## 2022-08-02 NOTE — Assessment & Plan Note (Signed)
Rate controlled at visit.  He is on Eliquis for anticoagulation.  No excessive bruising or bleeding.  Did realize that he has saw palmetto on his med list.  Advised that he discuss this with cardiology as the combination of this herbal supplement with his Eliquis could increase bleeding risk.

## 2022-08-03 NOTE — Progress Notes (Signed)
Please notify patient no evidence of pneumonia. Thanks!

## 2022-08-04 ENCOUNTER — Encounter: Payer: Self-pay | Admitting: Nurse Practitioner

## 2022-08-24 ENCOUNTER — Ambulatory Visit (INDEPENDENT_AMBULATORY_CARE_PROVIDER_SITE_OTHER): Payer: Managed Care, Other (non HMO) | Admitting: Pulmonary Disease

## 2022-08-24 ENCOUNTER — Encounter: Payer: Self-pay | Admitting: Pulmonary Disease

## 2022-08-24 VITALS — BP 130/80 | HR 77 | Temp 98.7°F | Ht 73.0 in | Wt 221.6 lb

## 2022-08-24 DIAGNOSIS — R053 Chronic cough: Secondary | ICD-10-CM | POA: Diagnosis not present

## 2022-08-24 NOTE — Patient Instructions (Signed)
I will see you back in about a year  Call us with any significant concerns  Continue using a nasal spray  Follow-up with Dr. Benjamine Mola -ENT

## 2022-08-24 NOTE — Progress Notes (Signed)
[name]    [id number]    [birth year]  Primary Care Physician:Golding, Jonny Ruiz, MD  Referring Physician: Assunta Found, MD [address] [place],  [place] [address]  Chief complaint:    Follow-up for cough, nasal stuffiness   HPI:  Recently saw [name]  On Flonase which has helped symptoms a little bit  Exercise capacity is improving  History of myasthenia gravis, status post thymectomy Continues to improve  Never smoker  Outpatient Encounter Medications as of 08/24/2022  Medication Sig   apixaban (ELIQUIS) 5 MG TABS tablet TAKE (1) TABLET BY MOUTH TWICE DAILY.   Ascorbic Acid (VITAMIN C) 1000 MG tablet Take 2,000 mg by mouth daily after lunch.    azaTHIOprine (IMURAN) 50 MG tablet Take 150 mg by mouth daily.    Calcium Carbonate-Vitamin D (CALCIUM 600+D) 600-200 MG-UNIT TABS Take 2 tablets by mouth daily after lunch.    fluticasone (FLONASE) 50 MCG/ACT nasal spray Place 2 sprays into both nostrils daily.   glimepiride (AMARYL) 1 MG tablet Take 1 tablet by mouth daily.   losartan (COZAAR) 50 MG tablet TAKE (1) TABLET BY MOUTH ONCE DAILY.   metFORMIN (GLUCOPHAGE-XR) 500 MG 24 hr tablet Take 1,000 mg by mouth 2 (two) times daily.   Omega-3 Fatty Acids (FISH OIL ULTRA) 1400 MG CAPS Take 1,400 mg by mouth daily after lunch.   omeprazole (PRILOSEC) 20 MG capsule Take 20mg , 30 minutes before breakfast daily. You may take a second dose before your evening meal if needed.   ONETOUCH VERIO test strip 1 each 3 (three) times daily.   OVER THE COUNTER MEDICATION Vitamin B one tablet daily   predniSONE (DELTASONE) 10 MG tablet 4 tabs for 2 days, then 3 tabs for 2 days, 2 tabs for 2 days, then 1 tab for 2 days, then stop   Saw Palmetto 450 MG CAPS Take 2,250 mg by mouth daily after lunch.   SYNTHROID 137 MCG tablet Take 137 mcg by mouth daily before breakfast.   tadalafil (CIALIS) 20 MG tablet Take 1 tablet (20 mg total) by mouth as needed.   tamsulosin (FLOMAX)  0.4 MG CAPS capsule Take 1 capsule (0.4 mg total) by mouth 2 (two) times daily.   vitamin E 180 MG (400 UNITS) capsule Take 800 Units by mouth daily after lunch.   acyclovir (ZOVIRAX) 400 MG tablet Take 800 mg by mouth daily.  (Patient not taking: Reported on 08/24/2022)   albuterol (PROVENTIL) 2 MG tablet Take 2 mg by mouth 3 (three) times daily. (Patient not taking: Reported on 08/24/2022)   albuterol (VENTOLIN HFA) 108 (90 Base) MCG/ACT inhaler Inhale 2 puffs into the lungs every 4 (four) hours. (Patient not taking: Reported on 08/24/2022)   doxycycline (VIBRA-TABS) 100 MG tablet Take 100 mg by mouth 2 (two) times daily. (Patient not taking: Reported on 08/24/2022)   omeprazole (PRILOSEC) 20 MG capsule  (Patient not taking: Reported on 08/24/2022)   zinc sulfate 220 (50 Zn) MG capsule Take 1 capsule (220 mg total) by mouth daily.   No facility-administered encounter medications on file as of 08/24/2022.    Allergies as of 08/24/2022 - Review Complete 08/24/2022  Allergen Reaction Noted   Levofloxacin Anaphylaxis 03/01/2011   Tequin [gatifloxacin] Anaphylaxis 06/07/2012   Iodinated contrast media Other (See Comments) and Rash 02/07/2018   Levothyroxine sodium Other (See Comments) 04/04/2021   Limonene Other (See Comments) and Rash 02/10/2015   Magnesium-containing compounds Other (See Comments) and Rash 04/01/2014  Aminoglycosides Other (See Comments) 06/07/2012   Avelox [moxifloxacin hcl in nacl] Other (See Comments) 06/07/2012   Botox [onabotulinumtoxina] Other (See Comments) 06/07/2012   Botulinum toxins Other (See Comments) 06/07/2012   Calcium channel blockers Other (See Comments) 06/07/2012   Ciprofloxacin Other (See Comments) 06/07/2012   Curare [tubocurarine] Other (See Comments) 06/07/2012   Dysport [abobotulinumtoxina] Other (See Comments) 06/07/2012   Erythromycin Other (See Comments) 06/07/2012   Factive [gemifloxacin] Other (See Comments) 06/07/2012   Floxin [ofloxacin] Other  (See Comments) 06/07/2012   Gentamycin [gentamicin] Other (See Comments) 06/07/2012   Interferons Other (See Comments) 06/07/2012   Kanamycin Other (See Comments) 06/07/2012   Ketek [telithromycin] Other (See Comments) 06/07/2012   Levaquin [levofloxacin in d5w] Other (See Comments) 06/07/2012   Levofloxacin Other (See Comments) 02/06/2022   Macrolides and ketolides Other (See Comments) 06/07/2012   Myobloc [rimabotulinumtoxinb] Other (See Comments) 06/07/2012   Neomycin Other (See Comments) 06/07/2012   Noroxin [norfloxacin] Other (See Comments) 06/07/2012   Other Other (See Comments) 06/07/2012   Penicillamine Other (See Comments) 06/07/2012   Procainamide Other (See Comments) 06/07/2012   Propranolol Other (See Comments) 06/07/2012   Quinidine Other (See Comments) 06/07/2012   Quinine derivatives Other (See Comments) 06/07/2012   Streptomycin Other (See Comments) 06/07/2012   Timolol Other (See Comments) 06/07/2012   Tobramycin Other (See Comments) 06/07/2012   Xeomin [incobotulinumtoxina] Other (See Comments) 06/07/2012   Zithromax [azithromycin] Other (See Comments) 06/07/2012    Past Medical History:  Diagnosis Date   Candida esophagitis (HCC) OCT 2014   Deafness in right ear    Diabetes mellitus without complication (HCC)    Difficult intubation    Esophageal stricture SEP 2011   FOOD IMPACTION-->SAV DIL 16 MM   GERD (gastroesophageal reflux disease)    Hypertension    Kidney stone    Myasthenia gravis (HCC)    Sleep apnea    Thyroid disease     Past Surgical History:  Procedure Laterality Date   ATRIAL FIBRILLATION ABLATION N/A 08/12/2020   Procedure: ATRIAL FIBRILLATION ABLATION;  Surgeon: Lanier Prude, MD;  [facility];  Service: Cardiovascular;  Laterality: N/A;   BIOPSY  02/14/2018   Procedure: BIOPSY;  Surgeon: West Bali, MD;  [facility]: [facility];  Service: Endoscopy;;  gastric   CARDIOVERSION N/A 06/08/2020   Procedure:  CARDIOVERSION;  Surgeon: Antoine Poche, MD;  [facility]: [facility];  Service: Endoscopy;  Laterality: N/A;   CARDIOVERSION N/A 09/16/2020   Procedure: CARDIOVERSION;  Surgeon: Meriam Sprague, MD;  Location: [facility] ENDOSCOPY;  Service: Cardiovascular;  Laterality: N/A;   CHOLECYSTECTOMY     COLONOSCOPY  2005   Dr. Lovell Sheehan   COLONOSCOPY N/A 07/17/2014   Procedure: COLONOSCOPY;  Surgeon: West Bali, MD;  [facility]: [facility];  Service: Endoscopy;  Laterality: N/A;  9:30-rescheduled 8/28 same time Soledad Gerlach to notify pt   ESOPHAGOGASTRODUODENOSCOPY N/A 07/25/2013   Shallow ulcer in the cardia, mid esophageal web, stricture at GE junction   ESOPHAGOGASTRODUODENOSCOPY N/A 02/14/2018   Procedure: ESOPHAGOGASTRODUODENOSCOPY (EGD);  Surgeon: West Bali, MD;  [facility];  Service: Endoscopy;  Laterality: N/A;  11:15am   ESOPHAGOGASTRODUODENOSCOPY (EGD) WITH ESOPHAGEAL DILATION N/A 08/29/2013   Candida esophagitis, small hiatal hernia, moderate nonerosive gastritis, mid esophageal web   HERNIA REPAIR     right inguinal   KIDNEY STONE SURGERY     NASAL SEPTUM SURGERY     PALATE SURGERY  SAVORY DILATION N/A 02/14/2018   Procedure: SAVORY DILATION;  Surgeon: West Bali, MD;  [facility]: [facility];  Service: Endoscopy;  Laterality: N/A;   TEE WITHOUT CARDIOVERSION N/A 08/10/2020   Procedure: TRANSESOPHAGEAL ECHOCARDIOGRAM (TEE);  Surgeon: Chilton Si, MD;  Location: [facility] ENDOSCOPY;  Service: Cardiovascular;  Laterality: N/A;   THYMECTOMY     TONSILLECTOMY     UPPER GASTROINTESTINAL ENDOSCOPY  SEP 2011   SAV DIL    Family History  Problem Relation Age of Onset   Liver cancer Father        age 65, deceased   Colon cancer Neg Hx     Social History   Socioeconomic History   Marital status: Married    Spouse name: Not on file   Number of children: Not on file   Years of education: Not on file   Highest education level: Not on file  Occupational  History   Not on file  Tobacco Use   Smoking status: Never   Smokeless tobacco: Never  Vaping Use   Vaping Use: Never used  Substance and Sexual Activity   Alcohol use: No   Drug use: No   Sexual activity: Yes    Partners: Female    Birth control/protection: None    Comment: spouse  Other Topics Concern   Not on file  Social History Narrative   Not on file   Social Determinants of Health   Financial Resource Strain: Not on file  Food Insecurity: Not on file  Transportation Needs: Not on file  Physical Activity: Not on file  Stress: Not on file  Social Connections: Not on file  Intimate Partner Violence: Not on file    Review of Systems  Constitutional: Negative.   HENT: Negative.    Eyes: Negative.   Respiratory:  Positive for cough. Negative for shortness of breath and wheezing.   Cardiovascular: Negative.   Gastrointestinal: Negative.   Psychiatric/Behavioral:  Positive for sleep disturbance.    Vitals:   08/24/22 1501  BP: 130/80  Pulse: 77  Temp: 98.7 F (37.1 C)  SpO2: 98%   Physical Exam Constitutional:      Appearance: He is well-developed.  HENT:     Head: Normocephalic and atraumatic.     Mouth/Throat:     Mouth: Mucous membranes are moist.  Eyes:     General:        Right eye: No discharge.        Left eye: No discharge.  Neck:     Thyroid: No thyromegaly.     Trachea: No tracheal deviation.  Cardiovascular:     Rate and Rhythm: Normal rate and regular rhythm.  Pulmonary:     Effort: Pulmonary effort is normal. No respiratory distress.     Breath sounds: Normal breath sounds. No stridor. No wheezing or rhonchi.  Musculoskeletal:     Cervical back: No rigidity or tenderness.  Neurological:     Mental Status: He is alert.  Psychiatric:        Mood and Affect: Mood normal.    Data Reviewed: Chest x-ray reviewed showing bibasal infiltrates  Assessment:  Nasal stuffiness and congestion is better -Continue Flonase  History of  hypoxemic respiratory failure following COVID infection -Resolved -Was able to wean off oxygen supplementation  History of myasthenia gravis -Has not had any recent exacerbations -Continues to follow-up with [facility]-Dr. Bess Harvest  Plan/Recommendations:  I will see you tentatively in a year  No testing to be done today  No changes to your treatment  Call us with significant concerns  Follow-up with Dr. Benjamine Mola as we discussed for ENT concerns   Sherrilyn Rist MD [facility] 08/24/2022, 3:23 PM  CC: Sharilyn Sites, MD

## 2022-10-16 ENCOUNTER — Ambulatory Visit (INDEPENDENT_AMBULATORY_CARE_PROVIDER_SITE_OTHER): Payer: Managed Care, Other (non HMO) | Admitting: Podiatry

## 2022-10-16 ENCOUNTER — Ambulatory Visit (INDEPENDENT_AMBULATORY_CARE_PROVIDER_SITE_OTHER): Payer: Managed Care, Other (non HMO)

## 2022-10-16 DIAGNOSIS — M7672 Peroneal tendinitis, left leg: Secondary | ICD-10-CM | POA: Diagnosis not present

## 2022-10-16 DIAGNOSIS — M722 Plantar fascial fibromatosis: Secondary | ICD-10-CM | POA: Diagnosis not present

## 2022-10-16 NOTE — Progress Notes (Unsigned)
Subjective:   Patient ID: [name], male   DOB: 70 y.o.   MRN: [id number]   HPI Chief Complaint  Patient presents with   Foot Pain    Left foot lateral side pain, Started 1 1/2 months ago, Rate of pain 6 out of 10, TX: Biofreeze X-Rays done today, Diabetic A1c- 6.8 BG- 147     71 y.o. male with the above concerns. He points the lateral aspect of the foot, pointing to the 5th metatarsal base. No treatment other than biofreeze that did not help. This does not limit his activities. Pain is random when it occurs.    Review of Systems  All other systems reviewed and are negative.  Past Medical History:  Diagnosis Date   Candida esophagitis (HCC) OCT 2014   Deafness in right ear    Diabetes mellitus without complication (HCC)    Difficult intubation    Esophageal stricture SEP 2011   FOOD IMPACTION-->SAV DIL 16 MM   GERD (gastroesophageal reflux disease)    Hypertension    Kidney stone    Myasthenia gravis (HCC)    Sleep apnea    Thyroid disease     Past Surgical History:  Procedure Laterality Date   ATRIAL FIBRILLATION ABLATION N/A 08/12/2020   Procedure: ATRIAL FIBRILLATION ABLATION;  Surgeon: Lanier Prude, MD;  [facility];  Service: Cardiovascular;  Laterality: N/A;   BIOPSY  02/14/2018   Procedure: BIOPSY;  Surgeon: West Bali, MD;  [facility];  Service: Endoscopy;;  gastric   CARDIOVERSION N/A 06/08/2020   Procedure: CARDIOVERSION;  Surgeon: Antoine Poche, MD;  [facility];  Service: Endoscopy;  Laterality: N/A;   CARDIOVERSION N/A 09/16/2020   Procedure: CARDIOVERSION;  Surgeon: Meriam Sprague, MD;  Location: [facility];  Service: Cardiovascular;  Laterality: N/A;   CHOLECYSTECTOMY     COLONOSCOPY  2005   Dr. Lovell Sheehan   COLONOSCOPY N/A 07/17/2014   Procedure: COLONOSCOPY;  Surgeon: West Bali, MD;  [facility];  Service: Endoscopy;  Laterality: N/A;  9:30-rescheduled 8/28 same time Soledad Gerlach to notify pt   ESOPHAGOGASTRODUODENOSCOPY N/A 07/25/2013   Shallow ulcer in the cardia, mid esophageal web, stricture at GE junction   ESOPHAGOGASTRODUODENOSCOPY N/A 02/14/2018   Procedure: ESOPHAGOGASTRODUODENOSCOPY (EGD);  Surgeon: West Bali, MD;  [facility];  Service: Endoscopy;  Laterality: N/A;  11:15am   ESOPHAGOGASTRODUODENOSCOPY (EGD) WITH ESOPHAGEAL DILATION N/A 08/29/2013   Candida esophagitis, small hiatal hernia, moderate nonerosive gastritis, mid esophageal web   HERNIA REPAIR     right inguinal   KIDNEY STONE SURGERY     NASAL SEPTUM SURGERY     PALATE SURGERY         SAVORY DILATION N/A 02/14/2018   Procedure: SAVORY DILATION;  Surgeon: West Bali, MD;  [facility];  Service: Endoscopy;  Laterality: N/A;   TEE WITHOUT CARDIOVERSION N/A 08/10/2020   Procedure: TRANSESOPHAGEAL ECHOCARDIOGRAM (TEE);  Surgeon: Chilton Si, MD;  Location: [facility] ENDOSCOPY;  Service: Cardiovascular;  Laterality: N/A;   THYMECTOMY     TONSILLECTOMY     UPPER GASTROINTESTINAL ENDOSCOPY  SEP 2011   SAV DIL     Current Outpatient Medications:    omeprazole (PRILOSEC) 20 MG capsule, , Disp: , Rfl:    acyclovir (ZOVIRAX) 400 MG tablet, Take 800 mg by mouth daily.  (Patient not taking: Reported on 08/24/2022), Disp: , Rfl:    albuterol (PROVENTIL) 2 MG tablet,  Take 2 mg by mouth 3 (three) times daily. (Patient not taking: Reported on 08/24/2022), Disp: , Rfl:    albuterol (VENTOLIN HFA) 108 (90 Base) MCG/ACT inhaler, Inhale 2 puffs into the lungs every 4 (four) hours. (Patient not taking: Reported on 08/24/2022), Disp: , Rfl:    apixaban (ELIQUIS) 5 MG TABS tablet, TAKE (1) TABLET BY MOUTH TWICE DAILY., Disp: 60 tablet, Rfl: 5   Ascorbic Acid (VITAMIN C) 1000 MG tablet, Take 2,000 mg by mouth daily after lunch. , Disp: , Rfl:    azaTHIOprine (IMURAN) 50 MG tablet, Take 150 mg by mouth daily. , Disp: , Rfl:    Calcium Carbonate-Vitamin D (CALCIUM 600+D) 600-200  MG-UNIT TABS, Take 2 tablets by mouth daily after lunch. , Disp: , Rfl:    doxycycline (VIBRA-TABS) 100 MG tablet, Take 100 mg by mouth 2 (two) times daily. (Patient not taking: Reported on 08/24/2022), Disp: , Rfl:    fluticasone (FLONASE) 50 MCG/ACT nasal spray, Place 2 sprays into both nostrils daily., Disp: 18.2 mL, Rfl: 2   glimepiride (AMARYL) 1 MG tablet, Take 1 tablet by mouth daily., Disp: , Rfl:    losartan (COZAAR) 50 MG tablet, TAKE (1) TABLET BY MOUTH ONCE DAILY., Disp: 90 tablet, Rfl: 3   metFORMIN (GLUCOPHAGE-XR) 500 MG 24 hr tablet, Take 1,000 mg by mouth 2 (two) times daily., Disp: , Rfl:    Omega-3 Fatty Acids (FISH OIL ULTRA) 1400 MG CAPS, Take 1,400 mg by mouth daily after lunch., Disp: , Rfl:    omeprazole (PRILOSEC) 20 MG capsule, Take 20mg , 30 minutes before breakfast daily. You may take a second dose before your evening meal if needed., Disp: 180 capsule, Rfl: 3   ONETOUCH VERIO test strip, 1 each 3 (three) times daily., Disp: , Rfl:    OVER THE COUNTER MEDICATION, Vitamin B one tablet daily, Disp: , Rfl:    predniSONE (DELTASONE) 10 MG tablet, 4 tabs for 2 days, then 3 tabs for 2 days, 2 tabs for 2 days, then 1 tab for 2 days, then stop, Disp: 20 tablet, Rfl: 0   Saw Palmetto 450 MG CAPS, Take 2,250 mg by mouth daily after lunch., Disp: , Rfl:    SYNTHROID 137 MCG tablet, Take 137 mcg by mouth daily before breakfast., Disp: , Rfl:    tadalafil (CIALIS) 20 MG tablet, Take 1 tablet (20 mg total) by mouth as needed., Disp: 10 tablet, Rfl: 5   tamsulosin (FLOMAX) 0.4 MG CAPS capsule, Take 1 capsule (0.4 mg total) by mouth 2 (two) times daily., Disp: 180 capsule, Rfl: 3   vitamin E 180 MG (400 UNITS) capsule, Take 800 Units by mouth daily after lunch., Disp: , Rfl:    zinc sulfate 220 (50 Zn) MG capsule, Take 1 capsule (220 mg total) by mouth daily., Disp: 30 capsule, Rfl: 1  Allergies  Allergen Reactions   Levofloxacin Anaphylaxis   Tequin [Gatifloxacin] Anaphylaxis    Iodinated Contrast Media Other (See Comments) and Rash    MD told pt cannot have MD told pt cannot have   Levothyroxine Sodium Other (See Comments)   Limonene Other (See Comments) and Rash    REACTION:Myasthenia Gravis (MG) Medication Alert REACTION:Myasthenia Gravis (MG) Medication Alert   Magnesium-Containing Compounds Other (See Comments) and Rash    Unknown Unknown Unknown   Aminoglycosides Other (See Comments)    REACTION: Myasthenia Gravis (MG) Medication Alert   Avelox [Moxifloxacin Hcl In Nacl] Other (See Comments)    REACTION: FLUOROQUINOLONE ANTIBIOTICS: Myasthenia Gravis (  MG) Medication Alert   Botox [Onabotulinumtoxina] Other (See Comments)    REACTION: Myasthenia Gravis (MG) Medication Alert   Botulinum Toxins Other (See Comments)    REACTION: Myasthenia Gravis (MG) Medication Alert   Calcium Channel Blockers Other (See Comments)    (BLOOD PRESSURE MEDICATION) REACTION: Myasthenia Gravis (MG) Medication Alert   Ciprofloxacin Other (See Comments)    REACTION: FLUOROQUINOLONE ANTIBIOTICS: Myasthenia Gravis (MG) Medication Alert   Curare [Tubocurarine] Other (See Comments)    (Usually only used during surgery)  REACTION: Myasthenia Gravis (MG) Medication Alert   Dysport [Abobotulinumtoxina] Other (See Comments)    REACTION: Myasthenia Gravis (MG) Medication Alert   Erythromycin Other (See Comments)    REACTION: Myasthenia Gravis (MG) Medication Alert   Factive [Gemifloxacin] Other (See Comments)    REACTION: FLUOROQUINOLONE ANTIBIOTICS: Myasthenia Gravis (MG) Medication Alert   Floxin [Ofloxacin] Other (See Comments)    REACTION: FLUOROQUINOLONE ANTIBIOTICS: Myasthenia Gravis (MG) Medication Alert   Gentamycin [Gentamicin] Other (See Comments)    REACTION:Myasthenia Gravis (MG) Medication Alert   Interferons Other (See Comments)    REACTION: Myasthenia Gravis (MG) Medication Alert   Kanamycin Other (See Comments)    REACTION: Myasthenia Gravis (MG) Medication Alert    Ketek [Telithromycin] Other (See Comments)    REACTION: Myasthenia Gravis (MG) Medication Alert   Levaquin [Levofloxacin In D5w] Other (See Comments)    REACTION: FLUOROQUINOLONE ANTIBIOTICS: Myasthenia Gravis (MG) Medication Alert   Levofloxacin Other (See Comments)   Macrolides And Ketolides Other (See Comments)    REACTION: Myasthenia Gravis (MG) Medication Alert REACTION: Myasthenia Gravis (MG) Medication Alert   Myobloc [Rimabotulinumtoxinb] Other (See Comments)    REACTION: Myasthenia Gravis (MG) Medication Alert   Neomycin Other (See Comments)    REACTION: Myasthenia Gravis (MG) Medication Alert   Noroxin [Norfloxacin] Other (See Comments)    REACTION: FLUOROQUINOLONE ANTIBIOTICS: Myasthenia Gravis (MG) Medication Alert   Other Other (See Comments)    MAGNESIUM SALTS: Milk of Magnesia, some antacids (Maalox, Mylanta) REACTION: Myasthenia Gravis (MG) Medication Alerta   Penicillamine Other (See Comments)    D-PENICILLAMINE *DO NOT CONFUSE WITH PENICILLIN*  REACTION: Myasthenia Gravis (MG) Medication Alert   Procainamide Other (See Comments)    REACTION: Myasthenia Gravis (MG) Medication Alert   Propranolol Other (See Comments)    REACTION: Myasthenia Gravis (MG) Medication Alert   Quinidine Other (See Comments)    REACTION: Myasthenia Gravis (MG) Medication Alert   Quinine Derivatives Other (See Comments)    REACTION: Myasthenia Gravis (MG) Medication Alert   Streptomycin Other (See Comments)    REACTION:  Myasthenia Gravis (MG) Medication Alert   Timolol Other (See Comments)    REACTION: Myasthenia Gravis (MG) Medication Alert   Tobramycin Other (See Comments)    REACTION: Myasthenia Gravis (MG) Medication Alert   Xeomin [Incobotulinumtoxina] Other (See Comments)    REACTION: Myasthenia Gravis (MG) Medication Alert   Zithromax [Azithromycin] Other (See Comments)    REACTION: Myasthenia Gravis (MG) Medication Alert           Objective:  Physical Exam  General:  AAO x3, NAD  Dermatological: Skin is warm, dry and supple bilateral.  There are no open sores, no preulcerative lesions, no rash or signs of infection present.  Vascular: Dorsalis Pedis artery and Posterior Tibial artery pedal pulses are 2/4 bilateral with immedate capillary fill time.  There is no pain with calf compression, swelling, warmth, erythema.   Neruologic: Grossly intact via light touch bilateral.   Musculoskeletal: Tenderness noted along the fifth metatarsal base  although minimal today.  No significant pain with peroneal tendon.  Clinically the tendon appears to be intact.  There is no edema, erythema.  Flexor, extensor tendons intact.  MMT 5/5.  Gait: Unassisted, Nonantalgic.       Assessment:   Fifth metatarsal base pain, insertional peroneal tendinitis left side     Plan:  -Treatment options discussed including all alternatives, risks, and complications -Etiology of symptoms were discussed -X-rays were obtained and reviewed with the patient.  3 views left foot were obtained.  No evidence of acute fracture noted.  Chronic avulsion noted off the fifth metatarsal base.  This is new compared to prior x-rays several years ago. -Discussed with conservative as well as surgical options.  Continue with now.  Discussed using good arch support.  He is taken himself off of blood thinners using ibuprofen for other issues.  Discussed Voltaren gel topically.  Follow-up with other doctors in regards to his blood thinners.   Vivi Barrack DPM

## 2022-10-16 NOTE — Patient Instructions (Signed)
Peroneal Tendinopathy Rehab Ask your health care provider which exercises are safe for you. Do exercises exactly as told by your health care provider and adjust them as directed. It is normal to feel mild stretching, pulling, tightness, or discomfort as you do these exercises. Stop right away if you feel sudden pain or your pain gets worse. Do not begin these exercises until told by your health care provider. Stretching and range-of-motion exercises These exercises warm up your muscles and joints. They can help improve the movement and flexibility of your ankle. They may also help to relieve pain and stiffness. Gastrocnemius and soleus stretch, standing This is an exercise in which you stand on a step and use your body weight to stretch your calf muscles. To do this exercise: Stand on the edge of a step on the ball of your left / right foot. The ball of your foot is on the walking surface, right under your toes. Keep your other foot firmly on the same step. Hold on to the wall, a railing, or a chair for balance. Slowly lift your other foot, allowing your body weight to press your left / right heel down over the edge of the step. You should feel a stretch in your left / right calf (gastrocnemius and soleus). Hold this position for __________ seconds. Return both feet to the step. Repeat this exercise with a slight bend in your left / right knee. Repeat __________ times with your left / right knee straight and __________ times with your left / right knee bent. Complete this exercise __________ times a day. Strengthening exercises These exercises build strength and endurance in your foot and ankle. Endurance is the ability to use your muscles for a long time, even after they get tired. Ankle dorsiflexion with band  Secure a rubber exercise band or tube to an object, such as a table leg, that will not move when the band is pulled. Secure the other end of the band around your left / right foot. Sit on  the floor. Face the object with your left / right leg extended. The band or tube should be slightly tense when your foot is relaxed. Slowly flex your left / right ankle and toes to bring your foot toward you (dorsiflexion). Hold this position for __________ seconds. Let the band or tube slowly pull your foot back to the starting position. Repeat __________ times. Complete this exercise __________ times a day. Ankle eversion  Sit on the floor with your legs straight out in front of you. Loop a rubber exercise band or tube around the ball of your left / right foot. The ball of your foot is on the walking surface, right under your toes. Hold the ends of the band in your hands. You can also secure the band to a stable object. The band or tube should be slightly tense when your foot is relaxed. Slowly push your foot outward, away from your other leg (eversion). Hold this position for __________ seconds. Slowly return your foot to the starting position. Repeat __________ times. Complete this exercise __________ times a day. Plantar flexion, standing This exercise is sometimes called a standing heel raise. Stand with your feet shoulder-width apart. Place your hands on a wall or table to steady yourself as needed. Try not to use it for support. Keep your weight spread evenly over the width of your feet while you slowly rise up on your toes (plantar flexion). If told by your health care provider: Shift your weight   toward your left / right leg until you feel challenged. Stand on your left / right leg only. Hold this position for __________ seconds. Repeat __________ times. Complete this exercise __________ times a day. Single leg stand  Without shoes, stand near a railing or in a doorway. You may hold on to the railing or doorframe as needed. Stand on your left / right foot. Keep your big toe down on the floor and try to keep your arch lifted. Do not roll to the outside of your foot. If this  exercise is too easy, you can try it with your eyes closed or while standing on a pillow. Hold this position for __________ seconds. Repeat __________ times. Complete this exercise __________ times a day. This information is not intended to replace advice given to you by your health care provider. Make sure you discuss any questions you have with your health care provider. Document Revised: 03/02/2022 Document Reviewed: 03/02/2022 Elsevier Patient Education  2023 Elsevier Inc.  

## 2022-10-20 ENCOUNTER — Other Ambulatory Visit: Payer: Self-pay | Admitting: Podiatry

## 2022-10-20 DIAGNOSIS — M722 Plantar fascial fibromatosis: Secondary | ICD-10-CM

## 2022-10-20 DIAGNOSIS — M7672 Peroneal tendinitis, left leg: Secondary | ICD-10-CM

## 2022-11-01 ENCOUNTER — Telehealth: Payer: Self-pay

## 2022-11-01 ENCOUNTER — Other Ambulatory Visit: Payer: Managed Care, Other (non HMO)

## 2022-11-01 DIAGNOSIS — N138 Other obstructive and reflux uropathy: Secondary | ICD-10-CM

## 2022-11-01 LAB — URINALYSIS, ROUTINE W REFLEX MICROSCOPIC
Bilirubin, UA: NEGATIVE
Glucose, UA: NEGATIVE
Nitrite, UA: NEGATIVE
Specific Gravity, UA: 1.01 (ref 1.005–1.030)
Urobilinogen, Ur: 0.2 mg/dL (ref 0.2–1.0)
pH, UA: 7 (ref 5.0–7.5)

## 2022-11-01 LAB — MICROSCOPIC EXAMINATION: WBC, UA: >30 /hpf — AB (ref 0–5)

## 2022-11-01 MED ORDER — CEFPODOXIME PROXETIL 200 MG PO TABS: 200.0000 mg | ORAL_TABLET | Freq: Two times a day (BID) | ORAL | 0 refills | Status: DC

## 2022-11-01 NOTE — Progress Notes (Signed)
Reviewed ua with Dr. Ronne Binning New order for vantin 200mg  po BID for 7 days sent to pharmacy. Patient aware of medication sent and culture pending.

## 2022-11-01 NOTE — Telephone Encounter (Signed)
Patient called advising he is having pain when urinating and frequent urination. Patient is concerned with having a possible UTI due to symptoms.

## 2022-11-01 NOTE — Telephone Encounter (Signed)
   Pre-operative Risk Assessment    Patient Name: [name]  DOB: 16-Feb-1951 MRN: [id number]      Request for Surgical Clearance    Procedure:   Right knee arthroscopy  Date of Surgery:  Clearance TBD                                 Surgeon:  Dr. Gean Birchwood Surgeon's Group or Practice Name:  [facility]  Phone number:  [phone] Fax number:  [phone]   Type of Clearance Requested:   - Medical  - Pharmacy:  Hold Apixaban (Eliquis)     Type of Anesthesia:  Spinal   Additional requests/questions:    SignedDorris Fetch   11/01/2022, 2:02 PM

## 2022-11-01 NOTE — Addendum Note (Signed)
Addended by: Ferdinand Lango on: 11/01/2022 10:15 AM   Modules accepted: Orders

## 2022-11-01 NOTE — Telephone Encounter (Signed)
Open in error

## 2022-11-02 ENCOUNTER — Telehealth: Payer: Self-pay

## 2022-11-02 NOTE — Telephone Encounter (Signed)
1st attempt to reach pt to schedule tele visit for clearance. Lvm

## 2022-11-02 NOTE — Telephone Encounter (Signed)
  Patient Consent for Virtual Visit         [name] has provided verbal consent on 11/02/2022 for a virtual visit (video or telephone).   CONSENT FOR VIRTUAL VISIT FOR:  [name]  By participating in this virtual visit I agree to the following:  I hereby voluntarily request, consent and authorize [facility] and its employed or contracted physicians, physician assistants, nurse practitioners or other licensed health care professionals (the Practitioner), to provide me with telemedicine health care services (the "Services") as deemed necessary by the treating Practitioner. I acknowledge and consent to receive the Services by the Practitioner via telemedicine. I understand that the telemedicine visit will involve communicating with the Practitioner through live audiovisual communication technology and the disclosure of certain medical information by electronic transmission. I acknowledge that I have been given the opportunity to request an in-person assessment or other available alternative prior to the telemedicine visit and am voluntarily participating in the telemedicine visit.  I understand that I have the right to withhold or withdraw my consent to the use of telemedicine in the course of my care at any time, without affecting my right to future care or treatment, and that the Practitioner or I may terminate the telemedicine visit at any time. I understand that I have the right to inspect all information obtained and/or recorded in the course of the telemedicine visit and may receive copies of available information for a reasonable fee.  I understand that some of the potential risks of receiving the Services via telemedicine include:  Delay or interruption in medical evaluation due to technological equipment failure or disruption; Information transmitted may not be sufficient (e.g. poor resolution of images) to allow for appropriate medical decision making by the Practitioner;  and/or  In rare instances, security protocols could fail, causing a breach of personal health information.  Furthermore, I acknowledge that it is my responsibility to provide information about my medical history, conditions and care that is complete and accurate to the best of my ability. I acknowledge that Practitioner's advice, recommendations, and/or decision may be based on factors not within their control, such as incomplete or inaccurate data provided by me or distortions of diagnostic images or specimens that may result from electronic transmissions. I understand that the practice of medicine is not an exact science and that Practitioner makes no warranties or guarantees regarding treatment outcomes. I acknowledge that a copy of this consent can be made available to me via my patient portal [facility] MyChart), or I can request a printed copy by calling the office of [facility].    I understand that my insurance will be billed for this visit.   I have read or had this consent read to me. I understand the contents of this consent, which adequately explains the benefits and risks of the Services being provided via telemedicine.  I have been provided ample opportunity to ask questions regarding this consent and the Services and have had my questions answered to my satisfaction. I give my informed consent for the services to be provided through the use of telemedicine in my medical care

## 2022-11-02 NOTE — Telephone Encounter (Signed)
Pt scheduled for a tele visit on 11/08/22 for clearance. Med rec and consent done.

## 2022-11-02 NOTE — Telephone Encounter (Signed)
Patient with diagnosis of afib on Eliquis for anticoagulation.    Procedure: right knee arthroscopy Date of procedure: TBD  CHA2DS2-VASc Score = 3  This indicates a 3.2% annual risk of stroke. The patient's score is based upon: CHF History: 0 HTN History: 1 Diabetes History: 1 Stroke History: 0 Vascular Disease History: 0 Age Score: 1 Gender Score: 0   CrCl 40mL/min Platelet count 203K  Per office protocol, patient can hold Eliquis for 3 days prior to procedure.    **This guidance is not considered finalized until pre-operative APP has relayed final recommendations.**

## 2022-11-02 NOTE — Telephone Encounter (Signed)
Primary Cardiologist:Suresh Purvis Sheffield, MD (Inactive)   Preoperative team, please contact this patient and set up a phone call appointment for further preoperative risk assessment. Please obtain consent and complete medication review. Thank you for your help.   I confirm that guidance regarding antiplatelet and oral anticoagulation therapy has been completed and, if necessary, noted below.   Levi Aland, NP-C  11/02/2022, 11:39 AM [address], [address] Office [phone] Fax [phone]

## 2022-11-07 LAB — URINE CULTURE

## 2022-11-07 NOTE — Progress Notes (Unsigned)
Virtual Visit via Telephone Note   Because of [name]'s co-morbid illnesses, he is at least at moderate risk for complications without adequate follow up.  This format is felt to be most appropriate for this patient at this time.  The patient did not have access to video technology/had technical difficulties with video requiring transitioning to audio format only (telephone).  All issues noted in this document were discussed and addressed.  No physical exam could be performed with this format.  Please refer to the patient's chart for his consent to telehealth for [facility].  Evaluation Performed:  Preoperative Cardiovascular Risk Assessment _____________   Date:  11/08/2022   Patient ID:  [name], DOB 03/04/1951, MRN [id number] Patient Location:  Home Provider location:   Office  Primary Care Provider:  Assunta Found, MD Primary Cardiologist:  Prentice Docker, MD (Inactive)  Chief Complaint / Patient Profile   [name] is a 71 y.o. year old male with a history of persistent atrial fibrillation s/p ablation in 07/2020 on Eliquis, non-ischemic cardiomyopathy with mildly reduced EF of 45-50% on Echo in 04/2020 but improved to 55-60% in 03/2021, hypertension, hypothyroidism, sleep apnea, GERD, esophageal stricture, myasthenia gravis who is pending a right knee arthroscopy and presents today for telephonic preoperative cardiovascular risk assessment.  Past Medical History    Past Medical History:  Diagnosis Date   Candida esophagitis (HCC) OCT 2014   Deafness in right ear    Diabetes mellitus without complication (HCC)    Difficult intubation    Esophageal stricture SEP 2011   FOOD IMPACTION-->SAV DIL 16 MM   GERD (gastroesophageal reflux disease)    Hypertension    Kidney stone    Myasthenia gravis (HCC)    Sleep apnea    Thyroid disease    Past Surgical History:  Procedure Laterality Date   ATRIAL FIBRILLATION ABLATION N/A 08/12/2020   Procedure:  ATRIAL FIBRILLATION ABLATION;  Surgeon: Lanier Prude, MD;  Location: [facility] INVASIVE CV LAB;  Service: Cardiovascular;  Laterality: N/A;   BIOPSY  02/14/2018   Procedure: BIOPSY;  Surgeon: West Bali, MD;  Location: [facility] ENDO SU[facility];  Service: Endoscopy;;  gastric   CARDIOVERSION N/A 06/08/2020   Procedure: CARDIOVERSION;  Surgeon: Antoine Poche, MD;  Location: [facility] ENDO [facility];  Service: Endoscopy;  Laterality: N/A;   CARDIOVERSION N/A 09/16/2020   Procedure: CARDIOVERSION;  Surgeon: Meriam Sprague, MD;  Location: [facility] ENDOSCOPY;  Service: Cardiovascular;  Laterality: N/A;   CHOLECYSTECTOMY     COLONOSCOPY  2005   Dr. Lovell Sheehan   COLONOSCOPY N/A 07/17/2014   Procedure: COLONOSCOPY;  Surgeon: West Bali, MD;  Location: [facility];  Service: Endoscopy;  Laterality: N/A;  9:30-rescheduled 8/28 same time Soledad Gerlach to notify pt   ESOPHAGOGASTRODUODENOSCOPY N/A 07/25/2013   Shallow ulcer in the cardia, mid esophageal web, stricture at GE junction   ESOPHAGOGASTRODUODENOSCOPY N/A 02/14/2018   Procedure: ESOPHAGOGASTRODUODENOSCOPY (EGD);  Surgeon: West Bali, MD;  Location: [facility];  Service: Endoscopy;  Laterality: N/A;  11:15am   ESOPHAGOGASTRODUODENOSCOPY (EGD) WITH ESOPHAGEAL DILATION N/A 08/29/2013   Candida esophagitis, small hiatal hernia, moderate nonerosive gastritis, mid esophageal web   HERNIA REPAIR     right inguinal   KIDNEY STONE SURGERY     NASAL SEPTUM SURGERY     PALATE SURGERY         SAVORY DILATION N/A 02/14/2018   Procedure: SAVORY DILATION;  Surgeon: West Bali, MD;  Location: [facility];  Service: Endoscopy;  Laterality: N/A;   TEE WITHOUT CARDIOVERSION N/A 08/10/2020   Procedure: TRANSESOPHAGEAL ECHOCARDIOGRAM (TEE);  Surgeon: Chilton Si, MD;  Location: [facility] ENDOSCOPY;  Service: Cardiovascular;  Laterality: N/A;   THYMECTOMY     TONSILLECTOMY     UPPER GASTROINTESTINAL ENDOSCOPY  SEP 2011   SAV DIL    Allergies  Allergies  Allergen  Reactions   Levofloxacin Anaphylaxis   Tequin [Gatifloxacin] Anaphylaxis   Iodinated Contrast Media Other (See Comments) and Rash    MD told pt cannot have MD told pt cannot have   Levothyroxine Sodium Other (See Comments)   Limonene Other (See Comments) and Rash    REACTION:Myasthenia Gravis (MG) Medication Alert REACTION:Myasthenia Gravis (MG) Medication Alert   Magnesium-Containing Compounds Other (See Comments) and Rash    Unknown Unknown Unknown   Aminoglycosides Other (See Comments)    REACTION: Myasthenia Gravis (MG) Medication Alert   Avelox [Moxifloxacin Hcl In Nacl] Other (See Comments)    REACTION: FLUOROQUINOLONE ANTIBIOTICS: Myasthenia Gravis (MG) Medication Alert   Botox [Onabotulinumtoxina] Other (See Comments)    REACTION: Myasthenia Gravis (MG) Medication Alert   Botulinum Toxins Other (See Comments)    REACTION: Myasthenia Gravis (MG) Medication Alert   Calcium Channel Blockers Other (See Comments)    (BLOOD PRESSURE MEDICATION) REACTION: Myasthenia Gravis (MG) Medication Alert   Ciprofloxacin Other (See Comments)    REACTION: FLUOROQUINOLONE ANTIBIOTICS: Myasthenia Gravis (MG) Medication Alert   Curare [Tubocurarine] Other (See Comments)    (Usually only used during surgery)  REACTION: Myasthenia Gravis (MG) Medication Alert   Dysport [Abobotulinumtoxina] Other (See Comments)    REACTION: Myasthenia Gravis (MG) Medication Alert   Erythromycin Other (See Comments)    REACTION: Myasthenia Gravis (MG) Medication Alert   Factive [Gemifloxacin] Other (See Comments)    REACTION: FLUOROQUINOLONE ANTIBIOTICS: Myasthenia Gravis (MG) Medication Alert   Floxin [Ofloxacin] Other (See Comments)    REACTION: FLUOROQUINOLONE ANTIBIOTICS: Myasthenia Gravis (MG) Medication Alert   Gentamycin [Gentamicin] Other (See Comments)    REACTION:Myasthenia Gravis (MG) Medication Alert   Interferons Other (See Comments)    REACTION: Myasthenia Gravis (MG) Medication Alert    Kanamycin Other (See Comments)    REACTION: Myasthenia Gravis (MG) Medication Alert   Ketek [Telithromycin] Other (See Comments)    REACTION: Myasthenia Gravis (MG) Medication Alert   Levaquin [Levofloxacin In D5w] Other (See Comments)    REACTION: FLUOROQUINOLONE ANTIBIOTICS: Myasthenia Gravis (MG) Medication Alert   Levofloxacin Other (See Comments)   Macrolides And Ketolides Other (See Comments)    REACTION: Myasthenia Gravis (MG) Medication Alert REACTION: Myasthenia Gravis (MG) Medication Alert   Myobloc [Rimabotulinumtoxinb] Other (See Comments)    REACTION: Myasthenia Gravis (MG) Medication Alert   Neomycin Other (See Comments)    REACTION: Myasthenia Gravis (MG) Medication Alert   Noroxin [Norfloxacin] Other (See Comments)    REACTION: FLUOROQUINOLONE ANTIBIOTICS: Myasthenia Gravis (MG) Medication Alert   Other Other (See Comments)    MAGNESIUM SALTS: Milk of Magnesia, some antacids (Maalox, Mylanta) REACTION: Myasthenia Gravis (MG) Medication Alerta   Penicillamine Other (See Comments)    D-PENICILLAMINE *DO NOT CONFUSE WITH PENICILLIN*  REACTION: Myasthenia Gravis (MG) Medication Alert   Procainamide Other (See Comments)    REACTION: Myasthenia Gravis (MG) Medication Alert   Propranolol Other (See Comments)    REACTION: Myasthenia Gravis (MG) Medication Alert   Quinidine Other (See Comments)    REACTION: Myasthenia Gravis (MG) Medication Alert   Quinine Derivatives Other (See Comments)    REACTION: Myasthenia Gravis (MG) Medication  Alert   Streptomycin Other (See Comments)    REACTION:  Myasthenia Gravis (MG) Medication Alert   Timolol Other (See Comments)    REACTION: Myasthenia Gravis (MG) Medication Alert   Tobramycin Other (See Comments)    REACTION: Myasthenia Gravis (MG) Medication Alert   Xeomin [Incobotulinumtoxina] Other (See Comments)    REACTION: Myasthenia Gravis (MG) Medication Alert   Zithromax [Azithromycin] Other (See Comments)    REACTION: Myasthenia  Gravis (MG) Medication Alert    History of Present Illness    Kellen Dutch is a 71 y.o. male who presents via audio/video conferencing for a telehealth visit today.  Patient was last seen in our office on 04/11/2022 by Dr. Lalla Brothers.  At that time, he was doing well from a cardiac standpoint. He is now pending procedure as outlined above. Since his last visit, he has done well from a cardiac standpoint. He denies any chest pain, shortness of breath, orthopnea/ PND, palpitations, lightheadedness, dizziness, or syncope. He is able to complete >4.0 METs without any anginal symptoms. He was recently diagnosed with a sinus infection and has a cough with this but was started on antibiotics yesterday.   Home Medications    Prior to Admission medications   Medication Sig Start Date End Date Taking? Authorizing Provider  acyclovir (ZOVIRAX) 400 MG tablet Take 800 mg by mouth daily.    [provider]  apixaban (ELIQUIS) 5 MG TABS tablet TAKE (1) TABLET BY MOUTH TWICE DAILY. 06/14/22   Lanier Prude, MD  Ascorbic Acid (VITAMIN C) 1000 MG tablet Take 2,000 mg by mouth daily after lunch.     [provider]  azaTHIOprine (IMURAN) 50 MG tablet Take 150 mg by mouth daily.     [provider]  Calcium Carbonate-Vitamin D (CALCIUM 600+D) 600-200 MG-UNIT TABS Take 2 tablets by mouth daily after lunch.     [provider]  cefpodoxime (VANTIN) 200 MG tablet Take 1 tablet (200 mg total) by mouth 2 (two) times daily. 11/01/22   McKenzie, Mardene Celeste, MD  fluticasone (FLONASE) 50 MCG/ACT nasal spray Place 2 sprays into both nostrils daily. 08/02/22   Cobb, Ruby Cola, NP  losartan (COZAAR) 50 MG tablet TAKE (1) TABLET BY MOUTH ONCE DAILY. 06/06/22   Lanier Prude, MD  Omega-3 Fatty Acids (FISH OIL ULTRA) 1400 MG CAPS Take 1,400 mg by mouth daily after lunch.    [provider]  omeprazole (PRILOSEC) 20 MG capsule     [provider]  ONETOUCH VERIO test strip  1 each 3 (three) times daily. 08/05/20   [provider]  OVER THE COUNTER MEDICATION Vitamin B one tablet daily    [provider]  Saw Palmetto 450 MG CAPS Take 2,250 mg by mouth daily after lunch.    [provider]  SYNTHROID 137 MCG tablet Take 137 mcg by mouth daily before breakfast. 07/03/13   [provider]  tadalafil (CIALIS) 20 MG tablet Take 1 tablet (20 mg total) by mouth as needed. 04/05/22   McKenzie, Mardene Celeste, MD  tamsulosin (FLOMAX) 0.4 MG CAPS capsule Take 1 capsule (0.4 mg total) by mouth 2 (two) times daily. 04/05/22   McKenzie, Mardene Celeste, MD  vitamin E 180 MG (400 UNITS) capsule Take 800 Units by mouth daily after lunch.    [provider]    Physical Exam    Vital Signs:  Shi Blankenship does not have vital signs available for review today.  Given telephonic nature of communication,  physical exam is limited. Alert and oriented x3. No acute distress. Normal affect. Speech and respirations are unlabored.  Accessory Clinical Findings    None  Assessment & Plan    Preoperative Cardiovascular Risk Assessment: Patient has an upcoming right knee arthroscopy planned. He is doing well from a cardiac standpoint as described above. He is able to complete >4.0 METS without an anginal symptoms. Per Revised Cardiac Risk Index Nedra Hai Criteria), considered low risk for an adverse cardiac event perioperatively. Therefore, based on ACC/AHA guidelines, patient would be at acceptable risk for the planned procedure without further cardiovascular testing. Per Pharmacy and office protocol, patient can hold Eliquis for 3 days prior to procedure. Please restart this as soon as safely possible afterwards.   A copy of this note will be routed to requesting surgeon.  Time:   Today, I have spent 5 minutes with the patient with telehealth technology discussing medical history, symptoms, and management plan.     Corrin Parker, PA-C  11/08/2022, 8:59  AM

## 2022-11-08 ENCOUNTER — Ambulatory Visit: Payer: Managed Care, Other (non HMO) | Attending: Cardiovascular Disease | Admitting: Student

## 2022-11-08 DIAGNOSIS — Z01810 Encounter for preprocedural cardiovascular examination: Secondary | ICD-10-CM | POA: Diagnosis not present

## 2023-02-02 DIAGNOSIS — M9903 Segmental and somatic dysfunction of lumbar region: Secondary | ICD-10-CM | POA: Diagnosis not present

## 2023-02-02 DIAGNOSIS — M9902 Segmental and somatic dysfunction of thoracic region: Secondary | ICD-10-CM | POA: Diagnosis not present

## 2023-02-02 DIAGNOSIS — M62830 Muscle spasm of back: Secondary | ICD-10-CM | POA: Diagnosis not present

## 2023-02-02 DIAGNOSIS — M9905 Segmental and somatic dysfunction of pelvic region: Secondary | ICD-10-CM | POA: Diagnosis not present

## 2023-02-07 DIAGNOSIS — M9903 Segmental and somatic dysfunction of lumbar region: Secondary | ICD-10-CM | POA: Diagnosis not present

## 2023-02-07 DIAGNOSIS — M9905 Segmental and somatic dysfunction of pelvic region: Secondary | ICD-10-CM | POA: Diagnosis not present

## 2023-02-07 DIAGNOSIS — M9902 Segmental and somatic dysfunction of thoracic region: Secondary | ICD-10-CM | POA: Diagnosis not present

## 2023-02-07 DIAGNOSIS — M62830 Muscle spasm of back: Secondary | ICD-10-CM | POA: Diagnosis not present

## 2023-02-09 ENCOUNTER — Other Ambulatory Visit: Payer: Self-pay | Admitting: Orthopedic Surgery

## 2023-02-12 ENCOUNTER — Telehealth: Payer: Self-pay

## 2023-02-12 NOTE — Telephone Encounter (Signed)
   Pre-operative Risk Assessment    Patient Name: [name]  DOB: [birth year] MRN: [id number]      Request for Surgical Clearance    Procedure:   RIGHT KNEE ARTHROSCOPY   Date of Surgery:  Clearance 03/12/23                                 Surgeon:  DR. Frederik Pear Surgeon's Group or Practice Name:  [facility] Phone number:  [phone] Fax number:  [phone]   Type of Clearance Requested:   - Pharmacy:  Hold Apixaban (Eliquis) INSTRUCTIONS ON WHEN TO HOLD   Type of Anesthesia:   CHOICE   Additional requests/questions:  This is a duplicate request but patient didn't have surgery.  Signed, Jacinta Shoe   02/12/2023, 10:04 AM

## 2023-02-12 NOTE — Telephone Encounter (Signed)
I left a message for the patient to call our office to schedule a tele visit for pre-op clearance.

## 2023-02-12 NOTE — Telephone Encounter (Signed)
Primary Cardiologist:Suresh Bronson Ing, MD (Inactive)   Preoperative team, please contact this patient and set up a phone call appointment for further preoperative risk assessment. Please obtain consent and complete medication review. Thank you for your help. He needs new primary cardiologist assigned.    Pending no changes in medical history, he may hold Eliquis for 3 days as previously approved on 11/02/22.   Emmaline Life, NP-C  02/12/2023, 11:20 AM [address] Office [phone] Fax [phone]

## 2023-02-13 ENCOUNTER — Telehealth: Payer: Self-pay | Admitting: *Deleted

## 2023-02-13 NOTE — Telephone Encounter (Signed)
S/w the pt and he has been scheduled for tele pre op appt 02/20/23 @ 9:20. Med rec and consent are done.      Patient Consent for Virtual Visit        [name] has provided verbal consent on 02/13/2023 for a virtual visit (video or telephone).   CONSENT FOR VIRTUAL VISIT FOR:  [name]  By participating in this virtual visit I agree to the following:  I hereby voluntarily request, consent and authorize [facility] and its employed or contracted physicians, physician assistants, nurse practitioners or other licensed health care professionals (the Practitioner), to provide me with telemedicine health care services (the "Services") as deemed necessary by the treating Practitioner. I acknowledge and consent to receive the Services by the Practitioner via telemedicine. I understand that the telemedicine visit will involve communicating with the Practitioner through live audiovisual communication technology and the disclosure of certain medical information by electronic transmission. I acknowledge that I have been given the opportunity to request an in-person assessment or other available alternative prior to the telemedicine visit and am voluntarily participating in the telemedicine visit.  I understand that I have the right to withhold or withdraw my consent to the use of telemedicine in the course of my care at any time, without affecting my right to future care or treatment, and that the Practitioner or I may terminate the telemedicine visit at any time. I understand that I have the right to inspect all information obtained and/or recorded in the course of the telemedicine visit and may receive copies of available information for a reasonable fee.  I understand that some of the potential risks of receiving the Services via telemedicine include:  Delay or interruption in medical evaluation due to technological equipment failure or disruption; Information transmitted may not be  sufficient (e.g. poor resolution of images) to allow for appropriate medical decision making by the Practitioner; and/or  In rare instances, security protocols could fail, causing a breach of personal health information.  Furthermore, I acknowledge that it is my responsibility to provide information about my medical history, conditions and care that is complete and accurate to the best of my ability. I acknowledge that Practitioner's advice, recommendations, and/or decision may be based on factors not within their control, such as incomplete or inaccurate data provided by me or distortions of diagnostic images or specimens that may result from electronic transmissions. I understand that the practice of medicine is not an exact science and that Practitioner makes no warranties or guarantees regarding treatment outcomes. I acknowledge that a copy of this consent can be made available to me via my patient portal ([facility]), or I can request a printed copy by calling the office of [facility].    I understand that my insurance will be billed for this visit.   I have read or had this consent read to me. I understand the contents of this consent, which adequately explains the benefits and risks of the Services being provided via telemedicine.  I have been provided ample opportunity to ask questions regarding this consent and the Services and have had my questions answered to my satisfaction. I give my informed consent for the services to be provided through the use of telemedicine in my medical care

## 2023-02-13 NOTE — Telephone Encounter (Signed)
2nd attempt to reach pt to schedule tele visit. Lvm

## 2023-02-13 NOTE — Telephone Encounter (Signed)
Patient returned Pre-op call. 

## 2023-02-13 NOTE — Telephone Encounter (Signed)
S/w the pt and he has been scheduled for tele pre op appt 02/20/23 @ 9:20. Med rec and consent are done.

## 2023-02-14 DIAGNOSIS — M62830 Muscle spasm of back: Secondary | ICD-10-CM | POA: Diagnosis not present

## 2023-02-14 DIAGNOSIS — M9903 Segmental and somatic dysfunction of lumbar region: Secondary | ICD-10-CM | POA: Diagnosis not present

## 2023-02-14 DIAGNOSIS — M9902 Segmental and somatic dysfunction of thoracic region: Secondary | ICD-10-CM | POA: Diagnosis not present

## 2023-02-14 DIAGNOSIS — M9905 Segmental and somatic dysfunction of pelvic region: Secondary | ICD-10-CM | POA: Diagnosis not present

## 2023-02-19 DIAGNOSIS — M9905 Segmental and somatic dysfunction of pelvic region: Secondary | ICD-10-CM | POA: Diagnosis not present

## 2023-02-19 DIAGNOSIS — M9902 Segmental and somatic dysfunction of thoracic region: Secondary | ICD-10-CM | POA: Diagnosis not present

## 2023-02-19 DIAGNOSIS — M62830 Muscle spasm of back: Secondary | ICD-10-CM | POA: Diagnosis not present

## 2023-02-19 DIAGNOSIS — M9903 Segmental and somatic dysfunction of lumbar region: Secondary | ICD-10-CM | POA: Diagnosis not present

## 2023-02-19 NOTE — Progress Notes (Unsigned)
Virtual Visit via Telephone Note   Because of [name]'s co-morbid illnesses, he is at least at moderate risk for complications without adequate follow up.  This format is felt to be most appropriate for this patient at this time.  The patient did not have access to video technology/had technical difficulties with video requiring transitioning to audio format only (telephone).  All issues noted in this document were discussed and addressed.  No physical exam could be performed with this format.  Please refer to the patient's chart for his consent to telehealth for [facility].  Evaluation Performed:  Preoperative cardiovascular risk assessment _____________   Date:  02/19/2023   Patient ID:  [name], DOB 1951/01/24, MRN [id number] Patient Location:  Home Provider location:   Office  Primary Care Provider:  Sharilyn Sites, MD Primary Cardiologist:  Kate Sable, MD (Inactive)  Chief Complaint / Patient Profile   72 y.o. y/o male with a h/o persistent atrial fibrillation s/p ablation in 07/2020 on Eliquis, non-ischemic cardiomyopathy with mildly reduced EF of 45-50% on Echo in 04/2020 but improved to 55-60% in 03/2021, hypertension, hypothyroidism, sleep apnea, GERD, esophageal stricture, myasthenia gravis  who is pending right knee arthroplasty and presents today for telephonic preoperative cardiovascular risk assessment.  History of Present Illness    [name] is a 72 y.o. male who presents via audio/video conferencing for a telehealth visit today.  Pt was last seen in cardiology clinic on 04/11/2022 by Dr. Quentin Ore.  At that time Argo Mccowin was doing well and was without any episodes of palpitations or chest discomfort..  The patient is now pending procedure as outlined above. Since his last visit, he reports that he has been doing well with no new cardiac complaints or bouts of atrial fibrillation.  He denies chest pain, shortness of breath, lower extremity  edema, fatigue, palpitations, melena, hematuria, hemoptysis, diaphoresis, weakness, presyncope, syncope, orthopnea, and PND.    Past Medical History    Past Medical History:  Diagnosis Date   Candida esophagitis ([place]) OCT 2014   Deafness in right ear    Diabetes mellitus without complication ([place])    Difficult intubation    Esophageal stricture SEP 2011   FOOD IMPACTION-->SAV DIL 16 MM   GERD (gastroesophageal reflux disease)    Hypertension    Kidney stone    Myasthenia gravis ([place])    Sleep apnea    Thyroid disease    Past Surgical History:  Procedure Laterality Date   ATRIAL FIBRILLATION ABLATION N/A 08/12/2020   Procedure: ATRIAL FIBRILLATION ABLATION;  Surgeon: Vickie Epley, MD;  Location: [facility];  Service: Cardiovascular;  Laterality: N/A;   BIOPSY  02/14/2018   Procedure: BIOPSY;  Surgeon: Danie Binder, MD;  Location: [facility];  Service: Endoscopy;;  gastric   CARDIOVERSION N/A 06/08/2020   Procedure: CARDIOVERSION;  Surgeon: Arnoldo Lenis, MD;  [facility];  Service: Endoscopy;  Laterality: N/A;   CARDIOVERSION N/A 09/16/2020   Procedure: CARDIOVERSION;  Surgeon: Freada Bergeron, MD;  Location: [facility] ENDOSCOPY;  Service: Cardiovascular;  Laterality: N/A;   CHOLECYSTECTOMY     COLONOSCOPY  2005   Dr. Arnoldo Morale   COLONOSCOPY N/A 07/17/2014   Procedure: COLONOSCOPY;  Surgeon: Danie Binder, MD;  [facility];  Service: Endoscopy;  Laterality: N/A;  9:30-rescheduled 8/28 same time Darius Bump to notify pt   ESOPHAGOGASTRODUODENOSCOPY N/A 07/25/2013   Shallow ulcer in the cardia, mid esophageal web, stricture at [place]  junction   ESOPHAGOGASTRODUODENOSCOPY N/A 02/14/2018   Procedure: ESOPHAGOGASTRODUODENOSCOPY (EGD);  Surgeon: Danie Binder, MD;  [facility];  Service: Endoscopy;  Laterality: N/A;  11:15am   ESOPHAGOGASTRODUODENOSCOPY (EGD) WITH ESOPHAGEAL DILATION N/A 08/29/2013   Candida esophagitis, small hiatal  hernia, moderate nonerosive gastritis, mid esophageal web   HERNIA REPAIR     right inguinal   KIDNEY STONE SURGERY     NASAL SEPTUM SURGERY     PALATE SURGERY         SAVORY DILATION N/A 02/14/2018   Procedure: SAVORY DILATION;  Surgeon: Danie Binder, MD;  [facility];  Service: Endoscopy;  Laterality: N/A;   TEE WITHOUT CARDIOVERSION N/A 08/10/2020   Procedure: TRANSESOPHAGEAL ECHOCARDIOGRAM (TEE);  Surgeon: Skeet Latch, MD;  Location: [facility] ENDOSCOPY;  Service: Cardiovascular;  Laterality: N/A;   THYMECTOMY     TONSILLECTOMY     UPPER GASTROINTESTINAL ENDOSCOPY  SEP 2011   SAV DIL    Allergies  Allergies  Allergen Reactions   Levofloxacin Anaphylaxis   Tequin [Gatifloxacin] Anaphylaxis   Iodinated Contrast Media Other (See Comments) and Rash    MD told pt cannot have MD told pt cannot have   Levothyroxine Sodium Other (See Comments)   Limonene Other (See Comments) and Rash    REACTION:Myasthenia Gravis (MG) Medication Alert REACTION:Myasthenia Gravis (MG) Medication Alert   Magnesium-Containing Compounds Other (See Comments) and Rash    Unknown Unknown Unknown   Aminoglycosides Other (See Comments)    REACTION: Myasthenia Gravis (MG) Medication Alert   Avelox [Moxifloxacin Hcl In Nacl] Other (See Comments)    REACTION: FLUOROQUINOLONE ANTIBIOTICS: Myasthenia Gravis (MG) Medication Alert   Botox [Onabotulinumtoxina] Other (See Comments)    REACTION: Myasthenia Gravis (MG) Medication Alert   Botulinum Toxins Other (See Comments)    REACTION: Myasthenia Gravis (MG) Medication Alert   Calcium Channel Blockers Other (See Comments)    (BLOOD PRESSURE MEDICATION) REACTION: Myasthenia Gravis (MG) Medication Alert   Ciprofloxacin Other (See Comments)    REACTION: FLUOROQUINOLONE ANTIBIOTICS: Myasthenia Gravis (MG) Medication Alert   Curare [Tubocurarine] Other (See Comments)    (Usually only used during surgery)  REACTION: Myasthenia Gravis (MG) Medication  Alert   Dysport [Abobotulinumtoxina] Other (See Comments)    REACTION: Myasthenia Gravis (MG) Medication Alert   Erythromycin Other (See Comments)    REACTION: Myasthenia Gravis (MG) Medication Alert   Factive [Gemifloxacin] Other (See Comments)    REACTION: FLUOROQUINOLONE ANTIBIOTICS: Myasthenia Gravis (MG) Medication Alert   Floxin [Ofloxacin] Other (See Comments)    REACTION: FLUOROQUINOLONE ANTIBIOTICS: Myasthenia Gravis (MG) Medication Alert   Gentamycin [Gentamicin] Other (See Comments)    REACTION:Myasthenia Gravis (MG) Medication Alert   Interferons Other (See Comments)    REACTION: Myasthenia Gravis (MG) Medication Alert   Kanamycin Other (See Comments)    REACTION: Myasthenia Gravis (MG) Medication Alert   Ketek [Telithromycin] Other (See Comments)    REACTION: Myasthenia Gravis (MG) Medication Alert   Levaquin [Levofloxacin In D5w] Other (See Comments)    REACTION: FLUOROQUINOLONE ANTIBIOTICS: Myasthenia Gravis (MG) Medication Alert   Levofloxacin Other (See Comments)   Macrolides And Ketolides Other (See Comments)    REACTION: Myasthenia Gravis (MG) Medication Alert REACTION: Myasthenia Gravis (MG) Medication Alert   Myobloc [Rimabotulinumtoxinb] Other (See Comments)    REACTION: Myasthenia Gravis (MG) Medication Alert   Neomycin Other (See Comments)    REACTION: Myasthenia Gravis (MG) Medication Alert   Noroxin [Norfloxacin] Other (See Comments)    REACTION: FLUOROQUINOLONE ANTIBIOTICS: Myasthenia Gravis (  MG) Medication Alert   Other Other (See Comments)    MAGNESIUM SALTS: Milk of Magnesia, some antacids (Maalox, Mylanta) REACTION: Myasthenia Gravis (MG) Medication Alerta   Penicillamine Other (See Comments)    D-PENICILLAMINE *DO NOT CONFUSE WITH PENICILLIN*  REACTION: Myasthenia Gravis (MG) Medication Alert   Procainamide Other (See Comments)    REACTION: Myasthenia Gravis (MG) Medication Alert   Propranolol Other (See Comments)    REACTION: Myasthenia Gravis  (MG) Medication Alert   Quinidine Other (See Comments)    REACTION: Myasthenia Gravis (MG) Medication Alert   Quinine Derivatives Other (See Comments)    REACTION: Myasthenia Gravis (MG) Medication Alert   Streptomycin Other (See Comments)    REACTION:  Myasthenia Gravis (MG) Medication Alert   Timolol Other (See Comments)    REACTION: Myasthenia Gravis (MG) Medication Alert   Tobramycin Other (See Comments)    REACTION: Myasthenia Gravis (MG) Medication Alert   Xeomin [Incobotulinumtoxina] Other (See Comments)    REACTION: Myasthenia Gravis (MG) Medication Alert   Zithromax [Azithromycin] Other (See Comments)    REACTION: Myasthenia Gravis (MG) Medication Alert    Home Medications    Prior to Admission medications   Medication Sig Start Date End Date Taking? Authorizing Provider  acyclovir (ZOVIRAX) 400 MG tablet Take 800 mg by mouth daily.    [provider]  apixaban (ELIQUIS) 5 MG TABS tablet TAKE (1) TABLET BY MOUTH TWICE DAILY. 06/14/22   Vickie Epley, MD  Ascorbic Acid (VITAMIN C) 1000 MG tablet Take 2,000 mg by mouth daily after lunch.     [provider]  azaTHIOprine (IMURAN) 50 MG tablet Take 150 mg by mouth daily.     [provider]  Calcium Carbonate-Vitamin D (CALCIUM 600+D) 600-200 MG-UNIT TABS Take 2 tablets by mouth daily after lunch.     [provider]  cefpodoxime (VANTIN) 200 MG tablet Take 1 tablet (200 mg total) by mouth 2 (two) times daily. 11/01/22   McKenzie, Candee Furbish, MD  fluticasone (FLONASE) 50 MCG/ACT nasal spray Place 2 sprays into both nostrils daily. 08/02/22   Cobb, Karie Schwalbe, NP  losartan (COZAAR) 50 MG tablet TAKE (1) TABLET BY MOUTH ONCE DAILY. 06/06/22   Vickie Epley, MD  Omega-3 Fatty Acids (FISH OIL ULTRA) 1400 MG CAPS Take 1,400 mg by mouth daily after lunch.    [provider]  omeprazole (PRILOSEC) 20 MG capsule     [provider]  ONETOUCH VERIO test strip 1 each 3 (three)  times daily. 08/05/20   [provider]  OVER THE COUNTER MEDICATION Vitamin B one tablet daily    [provider]  Saw Palmetto 450 MG CAPS Take 2,250 mg by mouth daily after lunch.    [provider]  SYNTHROID 137 MCG tablet Take 137 mcg by mouth daily before breakfast. 07/03/13   [provider]  tadalafil (CIALIS) 20 MG tablet Take 1 tablet (20 mg total) by mouth as needed. 04/05/22   McKenzie, Candee Furbish, MD  tamsulosin (FLOMAX) 0.4 MG CAPS capsule Take 1 capsule (0.4 mg total) by mouth 2 (two) times daily. 04/05/22   McKenzie, Candee Furbish, MD  vitamin E 180 MG (400 UNITS) capsule Take 800 Units by mouth daily after lunch.    [provider]    Physical Exam    Vital Signs:  Dmarius Kehl does not have vital signs available for review today.  Given telephonic nature of communication, physical exam is limited. AAOx3. NAD. Normal affect.  Speech and respirations are unlabored.  Accessory Clinical Findings    None  Assessment & Plan    1.  Preoperative Cardiovascular Risk Assessment:  Mr. Warr's perioperative risk of a major cardiac event is 0.9% according to the Revised Cardiac Risk Index (RCRI).  Therefore, he is at low risk for perioperative complications.   His functional capacity is good at 6.61 METs according to the [facility] Activity Status Index (DASI). Recommendations: According to ACC/AHA guidelines, no further cardiovascular testing needed.  The patient may proceed to surgery at acceptable risk.   Antiplatelet and/or Anticoagulation Recommendations:  Eliquis (Apixaban) can be held for 3 days prior to surgery.  Please resume post op when felt to be safe.      The patient was advised that if he develops new symptoms prior to surgery to contact our office to arrange for a follow-up visit, and he verbalized understanding.   A copy of this note will be routed to requesting surgeon.  Time:   Today, I have spent 7 minutes with the  patient with telehealth technology discussing medical history, symptoms, and management plan.     Mable Fill, Marissa Nestle, NP  02/19/2023, 10:31 AM

## 2023-02-20 ENCOUNTER — Telehealth: Payer: Self-pay | Admitting: Nurse Practitioner

## 2023-02-20 ENCOUNTER — Ambulatory Visit: Payer: Medicare Other | Attending: Cardiovascular Disease

## 2023-02-20 DIAGNOSIS — Z01810 Encounter for preprocedural cardiovascular examination: Secondary | ICD-10-CM

## 2023-02-20 NOTE — Telephone Encounter (Signed)
Patient was scheduled for 9:20 AM preoperative clearance exam and is unavailable at this time.  Detailed message left on voicemail to return call at earliest convenience.  Ambrose Pancoast, NP

## 2023-02-26 NOTE — Patient Instructions (Addendum)
SURGICAL WAITING ROOM VISITATION  Patients having surgery or a procedure may have no more than 2 support people in the waiting area - these visitors may rotate.    Children under the age of 30 must have an adult with them who is not the patient.  Due to an increase in RSV and influenza rates and associated hospitalizations, children ages 43 and under may not visit patients in [facility] hospitals.  If the patient needs to stay at the hospital during part of their recovery, the visitor guidelines for inpatient rooms apply. Pre-op nurse will coordinate an appropriate time for 1 support person to accompany patient in pre-op.  This support person may not rotate.    Please refer to the [facility] website for the visitor guidelines for Inpatients (after your surgery is over and you are in a regular room).       Your procedure is scheduled on: 03-12-23   Report to [facility] Main Entrance    Report to admitting at 10:45 AM   Call this number if you have problems the morning of surgery [phone]   Do not eat food :After Midnight.   After Midnight you may have the following liquids until 9:50 AM DAY OF SURGERY  Water Non-Citrus Juices (without pulp, NO RED-Apple, White grape, White cranberry) Black Coffee (NO MILK/CREAM OR CREAMERS, sugar ok)  Clear Tea (NO MILK/CREAM OR CREAMERS, sugar ok) regular and decaf                             Plain Jell-O (NO RED)                                           Fruit ices (not with fruit pulp, NO RED)                                     Popsicles (NO RED)                                                               Sports drinks like Gatorade (NO RED)                    The day of surgery:  Drink ONE (1) Pre-Surgery G2 at 9:50 AM the morning of surgery. Drink in one sitting. Do not sip.  This drink was given to you during your hospital  pre-op appointment visit. Nothing else to drink after completing the Pre-Surgery G2.           If you have questions, please contact your surgeon's office.   FOLLOW  ANY ADDITIONAL PRE OP INSTRUCTIONS YOU RECEIVED FROM YOUR SURGEON'S OFFICE!!!     Oral Hygiene is also important to reduce your risk of infection.                                    Remember - BRUSH YOUR TEETH THE MORNING OF SURGERY WITH YOUR REGULAR TOOTHPASTE  DENTURES  WILL BE REMOVED PRIOR TO SURGERY PLEASE DO NOT APPLY "Poly grip" OR ADHESIVES!!!   Do NOT smoke after Midnight   Take these medicines the morning of surgery with A SIP OF WATER:   Acyclovir  Azathioprine  Omeprazole  Synthroid  Tamsulosin  Zyrtec   Hold Eliquis 3 days before surgery   How to Manage Your Diabetes Before and After Surgery  Why is it important to control my blood sugar before and after surgery? Improving blood sugar levels before and after surgery helps healing and can limit problems. A way of improving blood sugar control is eating a healthy diet by:  Eating less sugar and carbohydrates  Increasing activity/exercise  Talking with your doctor about reaching your blood sugar goals High blood sugars (greater than 180 mg/dL) can raise your risk of infections and slow your recovery, so you will need to focus on controlling your diabetes during the weeks before surgery. Make sure that the doctor who takes care of your diabetes knows about your planned surgery including the date and location.  How do I manage my blood sugar before surgery? Check your blood sugar at least 4 times a day, starting 2 days before surgery, to make sure that the level is not too high or low. Check your blood sugar the morning of your surgery when you wake up and every 2 hours until you get to the Short Stay unit. If your blood sugar is less than 70 mg/dL, you will need to treat for low blood sugar: Do not take insulin. Treat a low blood sugar (less than 70 mg/dL) with  cup of clear juice (cranberry or apple), 4 glucose tablets, OR glucose gel. Recheck  blood sugar in 15 minutes after treatment (to make sure it is greater than 70 mg/dL). If your blood sugar is not greater than 70 mg/dL on recheck, call [phone] for further instructions. Report your blood sugar to the short stay nurse when you get to Short Stay.  If you are admitted to the hospital after surgery: Your blood sugar will be checked by the staff and you will probably be given insulin after surgery (instead of oral diabetes medicines) to make sure you have good blood sugar levels. The goal for blood sugar control after surgery is 80-180 mg/dL.   WHAT DO I DO ABOUT MY DIABETES MEDICATION?  Do not take oral diabetes medicines (pills) the morning of surgery.        Glimepiride - do not take evening dose the night before surgery  DO NOT TAKE THE FOLLOWING 7 DAYS PRIOR TO SURGERY: Ozempic, Wegovy, Rybelsus (Semaglutide), Byetta (exenatide), Bydureon (exenatide ER), Victoza, Saxenda (liraglutide), or Trulicity (dulaglutide) Mounjaro (Tirzepatide) Adlyxin (Lixisenatide), Polyethylene Glycol Loxenatide.  Reviewed and Endorsed by [facility] Patient Education Committee, August 2015  Bring CPAP mask and tubing day of surgery.                              You may not have any metal on your body including jewelry, and body piercing             Do not wear lotions, powders, cologne, or deodorant              Men may shave face and neck.   Do not bring valuables to the hospital. [facility] IS NOT RESPONSIBLE   FOR VALUABLES.   Contacts, glasses, dentures or bridgework may not be worn into surgery.  DO NOT BRING YOUR HOME MEDICATIONS TO THE HOSPITAL. PHARMACY WILL DISPENSE MEDICATIONS LISTED ON YOUR MEDICATION LIST TO YOU DURING YOUR ADMISSION IN THE HOSPITAL!    Patients discharged on the day of surgery will not be allowed to drive home.  Someone NEEDS to stay with you for the first 24 hours after anesthesia.   Special Instructions: Bring a copy of your healthcare power of  attorney and living will documents the day of surgery if you haven't scanned them before.              Please read over the following fact sheets you were given: IF YOU HAVE QUESTIONS ABOUT YOUR PRE-OP INSTRUCTIONS PLEASE CALL [phone] [facility]  If you received a COVID test during your pre-op visit  it is requested that you wear a mask when out in public, stay away from anyone that may not be feeling well and notify your surgeon if you develop symptoms. If you test positive for Covid or have been in contact with anyone that has tested positive in the last 10 days please notify you surgeon.    [place] - Preparing for Surgery Before surgery, you can play an important role.  Because skin is not sterile, your skin needs to be as free of germs as possible.  You can reduce the number of germs on your skin by washing with CHG (chlorahexidine gluconate) soap before surgery.  CHG is an antiseptic cleaner which kills germs and bonds with the skin to continue killing germs even after washing. Please DO NOT use if you have an allergy to CHG or antibacterial soaps.  If your skin becomes reddened/irritated stop using the CHG and inform your nurse when you arrive at Short Stay. Do not shave (including legs and underarms) for at least 48 hours prior to the first CHG shower.  You may shave your face/neck.  Please follow these instructions carefully:  1.  Shower with CHG Soap the night before surgery and the  morning of surgery.  2.  If you choose to wash your hair, wash your hair first as usual with your normal  shampoo.  3.  After you shampoo, rinse your hair and body thoroughly to remove the shampoo.                             4.  Use CHG as you would any other liquid soap.  You can apply chg directly to the skin and wash.  Gently with a scrungie or clean washcloth.  5.  Apply the CHG Soap to your body ONLY FROM THE NECK DOWN.   Do   not use on face/ open                           Wound or open sores. Avoid  contact with eyes, ears mouth and   genitals (private parts).                       Wash face,  Genitals (private parts) with your normal soap.             6.  Wash thoroughly, paying special attention to the area where your    surgery  will be performed.  7.  Thoroughly rinse your body with warm water from the neck down.  8.  DO NOT shower/wash with your normal soap after using and rinsing off  the CHG Soap.                9.  Pat yourself dry with a clean towel.            10.  Wear clean pajamas.            11.  Place clean sheets on your bed the night of your first shower and do not  sleep with pets. Day of Surgery : Do not apply any lotions/deodorants the morning of surgery.  Please wear clean clothes to the hospital/surgery center.  FAILURE TO FOLLOW THESE INSTRUCTIONS MAY RESULT IN THE CANCELLATION OF YOUR SURGERY  PATIENT SIGNATURE_________________________________  NURSE SIGNATURE__________________________________  ________________________________________________________________________  [name]  An incentive spirometer is a tool that can help keep your lungs clear and active. This tool measures how well you are filling your lungs with each breath. Taking long deep breaths may help reverse or decrease the chance of developing breathing (pulmonary) problems (especially infection) following: A long period of time when you are unable to move or be active. BEFORE THE PROCEDURE  If the spirometer includes an indicator to show your best effort, your nurse or respiratory therapist will set it to a desired goal. If possible, sit up straight or lean slightly forward. Try not to slouch. Hold the incentive spirometer in an upright position. INSTRUCTIONS FOR USE  Sit on the edge of your bed if possible, or sit up as far as you can in bed or on a chair. Hold the incentive spirometer in an upright position. Breathe out normally. Place the mouthpiece in your mouth and seal your  lips tightly around it. Breathe in slowly and as deeply as possible, raising the piston or the ball toward the top of the column. Hold your breath for 3-5 seconds or for as long as possible. Allow the piston or ball to fall to the bottom of the column. Remove the mouthpiece from your mouth and breathe out normally. Rest for a few seconds and repeat Steps 1 through 7 at least 10 times every 1-2 hours when you are awake. Take your time and take a few normal breaths between deep breaths. The spirometer may include an indicator to show your best effort. Use the indicator as a goal to work toward during each repetition. After each set of 10 deep breaths, practice coughing to be sure your lungs are clear. If you have an incision (the cut made at the time of surgery), support your incision when coughing by placing a pillow or rolled up towels firmly against it. Once you are able to get out of bed, walk around indoors and cough well. You may stop using the incentive spirometer when instructed by your caregiver.  RISKS AND COMPLICATIONS Take your time so you do not get dizzy or light-headed. If you are in pain, you may need to take or ask for pain medication before doing incentive spirometry. It is harder to take a deep breath if you are having pain. AFTER USE Rest and breathe slowly and easily. It can be helpful to keep track of a log of your progress. Your caregiver can provide you with a simple table to help with this. If you are using the spirometer at home, follow these instructions: SEEK MEDICAL CARE IF:  You are having difficultly using the spirometer. You have trouble using the spirometer as often as instructed. Your pain medication is not giving enough relief while using the spirometer. You develop fever of 100.5 F (  38.1 C) or higher. SEEK IMMEDIATE MEDICAL CARE IF:  You cough up bloody sputum that had not been present before. You develop fever of 102 F (38.9 C) or greater. You develop  worsening pain at or near the incision site. MAKE SURE YOU:  Understand these instructions. Will watch your condition. Will get help right away if you are not doing well or get worse. Document Released: 03/19/2007 Document Revised: 01/29/2012 Document Reviewed: 05/20/2007 [facility] Patient Information 2014 [employer], [place].   ________________________________________________________________________

## 2023-02-26 NOTE — Progress Notes (Addendum)
COVID Vaccine Completed:  No  Date of COVID positive in last 90 days:  No  PCP - Assunta Found, MD Cardiologist - Robin Searing, NP/Cameron Lalla Brothers, MD Neurologist -  Inez Catalina, MD Pulmonologist - Virl Diamond, MD  Cardiac Clearance in Epic dated 02-19-23 by Robin Searing, NP   Chest x-ray - 08-02-22 Epic EKG - 04-11-22 Epic Stress Test - N/A ECHO - 04-06-21 Epic Cardiac Cath - N/A Pacemaker/ICD device last checked: Spinal Cord Stimulator:N/A  Bowel Prep - N/A  Sleep Study - Yes, +sleep apnea.  Not an issue after weight loss CPAP - No  CBG 173 at PAT Fasting Blood Sugar - 80 to 200 Checks Blood Sugar - 2 times a day  Last dose of GLP1 agonist-  N/A GLP1 instructions:  N/A   Last dose of SGLT-2 inhibitors-  N/A SGLT-2 instructions: N/A  Blood Thinner Instructions:  Eliquis.  Hold 3 days before surgery (patient aware) Aspirin Instructions: Last Dose:  Activity level:  Can go up a flight of stairs and perform activities of daily living without stopping and without symptoms of chest pain or shortness of breath.  Anesthesia review: Afib with hx of ablation, HTN, myasthenia gravis  Patient denies shortness of breath, fever, cough and chest pain at PAT appointment  Patient verbalized understanding of instructions that were given to them at the PAT appointment. Patient was also instructed that they will need to review over the PAT instructions again at home before surgery.

## 2023-03-02 ENCOUNTER — Other Ambulatory Visit: Payer: Self-pay

## 2023-03-02 ENCOUNTER — Encounter (HOSPITAL_COMMUNITY): Payer: Self-pay

## 2023-03-02 ENCOUNTER — Encounter (HOSPITAL_COMMUNITY)
Admission: RE | Admit: 2023-03-02 | Discharge: 2023-03-02 | Disposition: A | Discharge status: Home / Self Care | Payer: Medicare Other | Source: Ambulatory Visit | Attending: Orthopedic Surgery | Admitting: Orthopedic Surgery

## 2023-03-02 VITALS — BP 147/84 | HR 66 | Temp 97.9°F | Resp 20 | Ht 73.0 in | Wt 217.0 lb

## 2023-03-02 DIAGNOSIS — S83241A Other tear of medial meniscus, current injury, right knee, initial encounter: Secondary | ICD-10-CM | POA: Diagnosis not present

## 2023-03-02 DIAGNOSIS — E119 Type 2 diabetes mellitus without complications: Secondary | ICD-10-CM | POA: Diagnosis not present

## 2023-03-02 DIAGNOSIS — G7000 Myasthenia gravis without (acute) exacerbation: Secondary | ICD-10-CM | POA: Diagnosis not present

## 2023-03-02 DIAGNOSIS — Z01818 Encounter for other preprocedural examination: Secondary | ICD-10-CM | POA: Insufficient documentation

## 2023-03-02 DIAGNOSIS — K219 Gastro-esophageal reflux disease without esophagitis: Secondary | ICD-10-CM | POA: Insufficient documentation

## 2023-03-02 DIAGNOSIS — I10 Essential (primary) hypertension: Secondary | ICD-10-CM | POA: Insufficient documentation

## 2023-03-02 DIAGNOSIS — G4730 Sleep apnea, unspecified: Secondary | ICD-10-CM | POA: Insufficient documentation

## 2023-03-02 DIAGNOSIS — I4891 Unspecified atrial fibrillation: Secondary | ICD-10-CM | POA: Diagnosis not present

## 2023-03-02 HISTORY — DX: Hypothyroidism, unspecified: E03.9

## 2023-03-02 HISTORY — DX: Personal history of urinary calculi: Z87.442

## 2023-03-02 LAB — BASIC METABOLIC PANEL
Anion gap: 8 (ref 5–15)
BUN: 13 mg/dL (ref 8–23)
CO2: 26 mmol/L (ref 22–32)
Calcium: 8.7 mg/dL — ABNORMAL LOW (ref 8.9–10.3)
Chloride: 105 mmol/L (ref 98–111)
Creatinine, Ser: 1 mg/dL (ref 0.61–1.24)
GFR, Estimated: >60 mL/min (ref >60)
Glucose, Bld: 173 mg/dL — ABNORMAL HIGH (ref 70–99)
Potassium: 4.5 mmol/L (ref 3.5–5.1)
Sodium: 139 mmol/L (ref 135–145)

## 2023-03-02 LAB — HEMOGLOBIN A1C
Hgb A1c MFr Bld: 6.6 % — ABNORMAL HIGH (ref 4.8–5.6)
Mean Plasma Glucose: 142.72 mg/dL

## 2023-03-02 LAB — GLUCOSE, CAPILLARY: Glucose-Capillary: 173 mg/dL — ABNORMAL HIGH (ref 70–99)

## 2023-03-05 NOTE — Progress Notes (Signed)
Anesthesia chart review   Case: [id number] Date/Time: 03/12/23 1243   Procedure: RIGHT KNEE ARTHROSCOPY (Right: Knee)   Anesthesia type: Choice   Pre-op diagnosis: right knee medial meniscus tear   Location: [facility] ROOM 07 / [facility]   Surgeons: Gean Birchwood, MD       DISCUSSION: 72 year old never smoker with history of GERD, DM, sleep apnea, HTN, atrial fibrillation status post ablation in 2021, NICM, myasthenia gravis status post thymectomy, right knee medial meniscus tear scheduled for above procedure 03/12/2023 with Dr. Susann Givens.  Difficult intubation per history.  Glidescope used 08/12/2020 with no complications.  Patient last seen by cardiology 02/20/2023.  Per office visit note, "Mr. Mulford's perioperative risk of a major cardiac event is 0.9% according to the Revised Cardiac Risk Index (RCRI).  Therefore, he is at low risk for perioperative complications.   His functional capacity is good at 6.61 METs according to the Duke Activity Status Index (DASI). Recommendations: According to ACC/AHA guidelines, no further cardiovascular testing needed.  The patient may proceed to surgery at acceptable risk.   Antiplatelet and/or Anticoagulation Recommendations:   Eliquis (Apixaban) can be held for 3 days prior to surgery.  Please resume post op when felt to be safe."  Patient last seen by neurology 09/13/2022.  Per office visit note, "Mr. 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 He remained essentially near-asymptomatic from MG standpoint with azathioprine  daily. We will continue the same treatment regimen."  1 year follow-up recommended.  Anticipate pt can proceed with planned procedure barring acute status change.    VS: BP (!) 147/84   Pulse 66   Temp 36.6 C (Oral)   Resp 20   Ht  (1.854 m)   Wt 98.4 kg   SpO2 98%   BMI  28.63 kg/m   PROVIDERS: Assunta Found, MD is PCP  Cardiologist - Robin Searing, NP/Cameron Lalla Brothers, MD Neurologist -  Inez Catalina, MD Pulmonologist - Virl Diamond, MD LABS: Labs reviewed: Acceptable for surgery. (all labs ordered are listed, but only abnormal results are displayed)  Labs Reviewed  HEMOGLOBIN A1C - Abnormal; Notable for the following components:      Result Value   Hgb A1c MFr Bld 6.6 (*)    All other components within normal limits  BASIC METABOLIC PANEL - Abnormal; Notable for the following components:   Glucose, Bld 173 (*)    Calcium 8.7 (*)    All other components within normal limits  GLUCOSE, CAPILLARY - Abnormal; Notable for the following components:   Glucose-Capillary 173 (*)    All other components within normal limits     IMAGES:   EKG:   CV: Echo 04/06/2021 1. Left ventricular ejection fraction, by estimation, is 55 to 60%. The  left ventricle has normal function. The left ventricle has no regional  wall motion abnormalities. There is mild left ventricular hypertrophy.  Left ventricular diastolic parameters  were normal.   2. Right ventricular systolic function is normal. The right ventricular  size is mildly enlarged. There is normal pulmonary artery systolic  pressure. The estimated right ventricular systolic pressure is 24.7 mmHg.   3. The mitral valve is normal in structure. Mild mitral valve  regurgitation. No evidence of mitral stenosis.   4. The aortic valve is normal in structure. Aortic valve regurgitation is  not visualized. No aortic stenosis is present.  5. The inferior vena cava is normal in size with greater than 50%  respiratory variability, suggesting right atrial pressure of 3 mmHg.   Past Medical History:  Diagnosis Date   Candida esophagitis 08/2013   Deafness in right ear    Diabetes mellitus without complication    Difficult intubation    Esophageal stricture 07/2010   FOOD IMPACTION-->SAV DIL 16 MM   GERD  (gastroesophageal reflux disease)    History of kidney stones    Hypertension    Hypothyroidism    Kidney stone    Myasthenia gravis    Sleep apnea    No CPAP   Thyroid disease     Past Surgical History:  Procedure Laterality Date   ATRIAL FIBRILLATION ABLATION N/A 08/12/2020   Procedure: ATRIAL FIBRILLATION ABLATION;  Surgeon: Lanier Prude, MD;  Location: [facility] INVASIVE CV LAB;  Service: Cardiovascular;  Laterality: N/A;   BIOPSY  02/14/2018   Procedure: BIOPSY;  Surgeon: West Bali, MD;  Location: [facility] ENDO SUITE;  Service: Endoscopy;;  gastric   CARDIOVERSION N/A 06/08/2020   Procedure: CARDIOVERSION;  Surgeon: Antoine Poche, MD;  Location: [facility] ENDO SU[facility];  Service: Endoscopy;  Laterality: N/A;   CARDIOVERSION N/A 09/16/2020   Procedure: CARDIOVERSION;  Surgeon: Meriam Sprague, MD;  Location: [facility] ENDOSCOPY;  Service: Cardiovascular;  Laterality: N/A;   CHOLECYSTECTOMY     COLONOSCOPY  2005   Dr. Lovell Sheehan   COLONOSCOPY N/A 07/17/2014   Procedure: COLONOSCOPY;  Surgeon: West Bali, MD;  Location: [facility];  Service: Endoscopy;  Laterality: N/A;  9:30-rescheduled 8/28 same time Soledad Gerlach to notify pt   ESOPHAGOGASTRODUODENOSCOPY N/A 07/25/2013   Shallow ulcer in the cardia, mid esophageal web, stricture at GE junction   ESOPHAGOGASTRODUODENOSCOPY N/A 02/14/2018   Procedure: ESOPHAGOGASTRODUODENOSCOPY (EGD);  Surgeon: West Bali, MD;  [facility];  Service: Endoscopy;  Laterality: N/A;  11:15am   ESOPHAGOGASTRODUODENOSCOPY (EGD) WITH ESOPHAGEAL DILATION N/A 08/29/2013   Candida esophagitis, small hiatal hernia, moderate nonerosive gastritis, mid esophageal web   HERNIA REPAIR     right inguinal   KIDNEY STONE SURGERY     NASAL SEPTUM SURGERY     PALATE SURGERY         SAVORY DILATION N/A 02/14/2018   Procedure: SAVORY DILATION;  Surgeon: West Bali, MD;  Location[facility];  Service: Endoscopy;  Laterality: N/A;   TEE WITHOUT  CARDIOVERSION N/A 08/10/2020   Procedure: TRANSESOPHAGEAL ECHOCARDIOGRAM (TEE);  Surgeon: Chilton Si, MD;  Location: [facility] ENDOSCOPY;  Service: Cardiovascular;  Laterality: N/A;   THYMECTOMY     TONSILLECTOMY     UPPER GASTROINTESTINAL ENDOSCOPY  SEP 2011   SAV DIL    MEDICATIONS:  acetaminophen (TYLENOL) 325 MG tablet   acyclovir (ZOVIRAX) 400 MG tablet   apixaban (ELIQUIS) 5 MG TABS tablet   Ascorbic Acid (VITAMIN C PO)   azaTHIOprine (IMURAN) 50 MG tablet   Calcium Carbonate-Vit D-Min (CALCIUM 600+D3 PLUS MINERALS) 600-800 MG-UNIT TABS   cefpodoxime (VANTIN) 200 MG tablet   cetirizine (ZYRTEC) 10 MG tablet   fluticasone (FLONASE) 50 MCG/ACT nasal spray   glimepiride (AMARYL) 1 MG tablet   ibuprofen (ADVIL) 200 MG tablet   losartan (COZAAR) 50 MG tablet   metFORMIN (GLUCOPHAGE-XR) 500 MG 24 hr tablet   Omega-3 Fatty Acids (FISH OIL ULTRA) 1400 MG CAPS   omeprazole (PRILOSEC) 20 MG capsule   ONETOUCH VERIO test strip   Saw Palmetto 450 MG CAPS  SYNTHROID 137 MCG tablet   tadalafil (CIALIS) 20 MG tablet   tamsulosin (FLOMAX) 0.4 MG CAPS capsule   vitamin E 180 MG (400 UNITS) capsule   No current facility-administered medications for this encounter.    Jodell Cipro Ward, PA-C [facility] Pre-Surgical Testing [phone]

## 2023-03-05 NOTE — Anesthesia Preprocedure Evaluation (Addendum)
Anesthesia Evaluation  Patient identified by MRN, date of birth, ID band Patient awake    Reviewed: Allergy & Precautions, NPO status , Patient's Chart, lab work & pertinent test results  Airway Mallampati: I  TM Distance: <3 FB Neck ROM: Full    Dental  (+) Teeth Intact, Dental Advisory Given   Pulmonary sleep apnea    breath sounds clear to auscultation       Cardiovascular hypertension, Pt. on medications  Rhythm:Regular Rate:Normal     Neuro/Psych  Neuromuscular disease    GI/Hepatic Neg liver ROS,GERD  Medicated,,  Endo/Other  diabetes, Type 2, Oral Hypoglycemic AgentsHypothyroidism    Renal/GU Renal disease     Musculoskeletal   Abdominal   Peds  Hematology   Anesthesia Other Findings   Reproductive/Obstetrics                             Anesthesia Physical Anesthesia Plan  ASA: 2  Anesthesia Plan: General   Post-op Pain Management: Tylenol PO (pre-op)*   Induction: Intravenous  PONV Risk Score and Plan: 3 and Ondansetron, Dexamethasone and Midazolam  Airway Management Planned: LMA  Additional Equipment: None  Intra-op Plan:   Post-operative Plan: Extubation in OR  Informed Consent: I have reviewed the patients History and Physical, chart, labs and discussed the procedure including the risks, benefits and alternatives for the proposed anesthesia with the patient or authorized representative who has indicated his/her understanding and acceptance.     Dental advisory given  Plan Discussed with: CRNA  Anesthesia Plan Comments: (See PAT note 03/02/2023)       Anesthesia Quick Evaluation

## 2023-03-09 DIAGNOSIS — S83241A Other tear of medial meniscus, current injury, right knee, initial encounter: Secondary | ICD-10-CM | POA: Diagnosis present

## 2023-03-09 NOTE — H&P (Signed)
[name] is an 72 y.o. male.   Chief Complaint: Right knee pain  HPI: [name] is here today for follow-up on right knee pain.  Recall, this patient has a prior history of moderate arthritis of the patellofemoral compartment and there is concerns for meniscal tear.  We referred the patient for an MRI and he is here today to review the results.  He continues to notice pain that interferes with work and chores.  He denies any fevers chills night sweats or other signs of infection.  He does mention that he is going on a trip in early January.  Past Medical History:  Diagnosis Date   Candida esophagitis 08/2013   Deafness in right ear    Diabetes mellitus without complication    Difficult intubation    Esophageal stricture 07/2010   FOOD IMPACTION-->SAV DIL 16 MM   GERD (gastroesophageal reflux disease)    History of kidney stones    Hypertension    Hypothyroidism    Kidney stone    Myasthenia gravis    Sleep apnea    No CPAP   Thyroid disease     Past Surgical History:  Procedure Laterality Date   ATRIAL FIBRILLATION ABLATION N/A 08/12/2020   Procedure: ATRIAL FIBRILLATION ABLATION;  Surgeon: Lanier Prude, MD;  Location: [facility];  Service: Cardiovascular;  Laterality: N/A;   BIOPSY  02/14/2018   Procedure: BIOPSY;  Surgeon: West Bali, MD;  Location: [facility];  Service: Endoscopy;;  gastric   CARDIOVERSION N/A 06/08/2020   Procedure: CARDIOVERSION;  Surgeon: Antoine Poche, MD;  Location: [facility];  Service: Endoscopy;  Laterality: N/A;   CARDIOVERSION N/A 09/16/2020   Procedure: CARDIOVERSION;  Surgeon: Meriam Sprague, MD;  Location: [facility] ENDOS[facility];  Service: Cardiovascular;  Laterality: N/A;   CHOLECYSTECTOMY     COLONOSCOPY  2005   Dr. Lovell Sheehan   COLONOSCOPY N/A 07/17/2014   Procedure: COLONOSCOPY;  Surgeon: West Bali, MD;  Location: [facility];  Service: Endoscopy;  Laterality: N/A;  9:30-rescheduled 8/28 same time Soledad Gerlach to  notify pt   ESOPHAGOGASTRODUODENOSCOPY N/A 07/25/2013   Shallow ulcer in the cardia, mid esophageal web, stricture at GE junction   ESOPHAGOGASTRODUODENOSCOPY N/A 02/14/2018   Procedure: ESOPHAGOGASTRODUODENOSCOPY (EGD);  Surgeon: West Bali, MD;  Location: [facility];  Service: Endoscopy;  Laterality: N/A;  11:15am   ESOPHAGOGASTRODUODENOSCOPY (EGD) WITH ESOPHAGEAL DILATION N/A 08/29/2013   Candida esophagitis, small hiatal hernia, moderate nonerosive gastritis, mid esophageal web   HERNIA REPAIR     right inguinal   KIDNEY STONE SURGERY     NASAL SEPTUM SURGERY     PALATE SURGERY         SAVORY DILATION N/A 02/14/2018   Procedure: SAVORY DILATION;  Surgeon: West Bali, MD;  Location: [facility];  Service: Endoscopy;  Laterality: N/A;   TEE WITHOUT CARDIOVERSION N/A 08/10/2020   Procedure: TRANSESOPHAGEAL ECHOCARDIOGRAM (TEE);  Surgeon: Chilton Si, MD;  Location: [facility] ENDOSCOPY;  Service: Cardiovascular;  Laterality: N/A;   THYMECTOMY     TONSILLECTOMY     UPPER GASTROINTESTINAL ENDOSCOPY  SEP 2011   SAV DIL    Family History  Problem Relation Age of Onset   Liver cancer Father        age 68, deceased   Colon cancer Neg Hx    Social History:  reports that he has never smoked. He has never used smokeless tobacco. He reports that he does not  drink alcohol and does not use drugs.  Allergies:  Allergies  Allergen Reactions   Levofloxacin Anaphylaxis   Tequin [Gatifloxacin] Anaphylaxis   Iodinated Contrast Media     MD told pt cannot have    Levothyroxine Sodium Other (See Comments)    Myasthenia Gravis (MG) Medication Alert   Limonene Other (See Comments) and Rash    REACTION:Myasthenia Gravis (MG) Medication Alert REACTION:Myasthenia Gravis (MG) Medication Alert   Magnesium-Containing Compounds Other (See Comments) and Rash    Unknown Unknown Unknown   Aminoglycosides Other (See Comments)    REACTION: Myasthenia Gravis (MG) Medication Alert   Avelox  [Moxifloxacin Hcl In Nacl] Other (See Comments)    REACTION: FLUOROQUINOLONE ANTIBIOTICS: Myasthenia Gravis (MG) Medication Alert   Botox [Onabotulinumtoxina] Other (See Comments)    REACTION: Myasthenia Gravis (MG) Medication Alert   Botulinum Toxins Other (See Comments)    REACTION: Myasthenia Gravis (MG) Medication Alert   Calcium Channel Blockers Other (See Comments)    (BLOOD PRESSURE MEDICATION) REACTION: Myasthenia Gravis (MG) Medication Alert   Ciprofloxacin Other (See Comments)    REACTION: FLUOROQUINOLONE ANTIBIOTICS: Myasthenia Gravis (MG) Medication Alert   Curare [Tubocurarine] Other (See Comments)    (Usually only used during surgery)  REACTION: Myasthenia Gravis (MG) Medication Alert   Dysport [Abobotulinumtoxina] Other (See Comments)    REACTION: Myasthenia Gravis (MG) Medication Alert   Erythromycin Other (See Comments)    REACTION: Myasthenia Gravis (MG) Medication Alert   Factive [Gemifloxacin] Other (See Comments)    REACTION: FLUOROQUINOLONE ANTIBIOTICS: Myasthenia Gravis (MG) Medication Alert   Floxin [Ofloxacin] Other (See Comments)    REACTION: FLUOROQUINOLONE ANTIBIOTICS: Myasthenia Gravis (MG) Medication Alert   Gentamycin [Gentamicin] Other (See Comments)    REACTION:Myasthenia Gravis (MG) Medication Alert   Interferons Other (See Comments)    REACTION: Myasthenia Gravis (MG) Medication Alert   Kanamycin Other (See Comments)    REACTION: Myasthenia Gravis (MG) Medication Alert   Ketek [Telithromycin] Other (See Comments)    REACTION: Myasthenia Gravis (MG) Medication Alert   Levaquin [Levofloxacin In D5w] Other (See Comments)    REACTION: FLUOROQUINOLONE ANTIBIOTICS: Myasthenia Gravis (MG) Medication Alert   Levofloxacin Other (See Comments)    Myasthenia Gravis (MG) Medication Alert   Macrolides And Ketolides Other (See Comments)    REACTION: Myasthenia Gravis (MG) Medication Alert REACTION: Myasthenia Gravis (MG) Medication Alert   Myobloc  [Rimabotulinumtoxinb] Other (See Comments)    REACTION: Myasthenia Gravis (MG) Medication Alert   Neomycin Other (See Comments)    REACTION: Myasthenia Gravis (MG) Medication Alert   Noroxin [Norfloxacin] Other (See Comments)    REACTION: FLUOROQUINOLONE ANTIBIOTICS: Myasthenia Gravis (MG) Medication Alert   Other Other (See Comments)    MAGNESIUM SALTS: Milk of Magnesia, some antacids (Maalox, Mylanta) REACTION: Myasthenia Gravis (MG) Medication Alerta   Penicillamine Other (See Comments)    D-PENICILLAMINE *DO NOT CONFUSE WITH PENICILLIN*  REACTION: Myasthenia Gravis (MG) Medication Alert   Procainamide Other (See Comments)    REACTION: Myasthenia Gravis (MG) Medication Alert   Propranolol Other (See Comments)    REACTION: Myasthenia Gravis (MG) Medication Alert   Quinidine Other (See Comments)    REACTION: Myasthenia Gravis (MG) Medication Alert   Quinine Derivatives Other (See Comments)    REACTION: Myasthenia Gravis (MG) Medication Alert   Streptomycin Other (See Comments)    REACTION:  Myasthenia Gravis (MG) Medication Alert   Timolol Other (See Comments)    REACTION: Myasthenia Gravis (MG) Medication Alert   Tobramycin Other (See Comments)    REACTION:  Myasthenia Gravis (MG) Medication Alert   Xeomin [Incobotulinumtoxina] Other (See Comments)    REACTION: Myasthenia Gravis (MG) Medication Alert   Zithromax [Azithromycin] Other (See Comments)    REACTION: Myasthenia Gravis (MG) Medication Alert    No medications prior to admission.    No results found for this or any previous visit (from the past 48 hour(s)). No results found.  Review of Systems  Constitutional: Negative.   HENT:  Positive for hearing loss and tinnitus.   Eyes: Negative.   Respiratory: Negative.    Cardiovascular: Negative.   Gastrointestinal: Negative.   Endocrine: Negative.        Blood sugar problem  Genitourinary: Negative.        ED  Musculoskeletal:  Positive for arthralgias.   Allergic/Immunologic: Negative.   Neurological: Negative.   Hematological: Negative.   Psychiatric/Behavioral: Negative.      There were no vitals taken for this visit. Physical Exam Constitutional:      Appearance: Normal appearance. He is normal weight.  HENT:     Head: Normocephalic and atraumatic.     Nose: Nose normal.  Eyes:     Pupils: Pupils are equal, round, and reactive to light.  Cardiovascular:     Pulses: Normal pulses.  Pulmonary:     Effort: Pulmonary effort is normal.  Musculoskeletal:     Cervical back: Normal range of motion and neck supple.     Comments: the patient has a range from 0-110.  Pain over the medial joint line.  Calves are soft and nontender.  McMurray's test continues to cause discomfort.  He does walk with a normal gait.  Skin:    General: Skin is warm and dry.  Neurological:     General: No focal deficit present.     Mental Status: He is alert and oriented to person, place, and time. Mental status is at baseline.  Psychiatric:        Mood and Affect: Mood normal.        Behavior: Behavior normal.        Thought Content: Thought content normal.        Judgment: Judgment normal.      Patient's MRI is reviewed and this does show a large horizontal tear of the posterior horn and body of the medial meniscus.  Patient also has a vertical tear of the lateral meniscus.  Assessment/Plan Assess: Right knee medial meniscus tear  Plan: Treatment options are discussed with the patient.  Today, the patient does wish to proceed with knee arthroscopy.  The benefits risks and potential complications of surgery are discussed.  He wishes to proceed.  A posting slip is completed and he will discuss timing with Sharyn Blitz our surgery scheduler.  We anticipate seeing this patient back at the time of surgical intervention.  No medications were asked for or given today.  He is to call with any issues.  Dannielle Burn, PA-C 03/09/2023, 11:06 AM

## 2023-03-12 ENCOUNTER — Encounter (HOSPITAL_COMMUNITY)
Admission: RE | Disposition: A | Discharge status: Home / Self Care | Payer: Self-pay | Source: Ambulatory Visit | Attending: Orthopedic Surgery

## 2023-03-12 ENCOUNTER — Other Ambulatory Visit: Payer: Self-pay

## 2023-03-12 ENCOUNTER — Encounter (HOSPITAL_COMMUNITY): Payer: Self-pay | Admitting: Orthopedic Surgery

## 2023-03-12 ENCOUNTER — Ambulatory Visit (HOSPITAL_COMMUNITY)
Admission: RE | Admit: 2023-03-12 | Discharge: 2023-03-12 | Disposition: A | Discharge status: Home / Self Care | Payer: Medicare Other | Source: Ambulatory Visit | Attending: Orthopedic Surgery | Admitting: Orthopedic Surgery

## 2023-03-12 ENCOUNTER — Ambulatory Visit (HOSPITAL_COMMUNITY): Payer: Medicare Other | Admitting: Physician Assistant

## 2023-03-12 ENCOUNTER — Ambulatory Visit (HOSPITAL_BASED_OUTPATIENT_CLINIC_OR_DEPARTMENT_OTHER): Payer: Medicare Other | Admitting: Certified Registered"

## 2023-03-12 DIAGNOSIS — E1149 Type 2 diabetes mellitus with other diabetic neurological complication: Secondary | ICD-10-CM | POA: Diagnosis not present

## 2023-03-12 DIAGNOSIS — E119 Type 2 diabetes mellitus without complications: Secondary | ICD-10-CM

## 2023-03-12 DIAGNOSIS — I10 Essential (primary) hypertension: Secondary | ICD-10-CM | POA: Diagnosis not present

## 2023-03-12 DIAGNOSIS — G4730 Sleep apnea, unspecified: Secondary | ICD-10-CM | POA: Diagnosis not present

## 2023-03-12 DIAGNOSIS — Z7984 Long term (current) use of oral hypoglycemic drugs: Secondary | ICD-10-CM

## 2023-03-12 DIAGNOSIS — S83241A Other tear of medial meniscus, current injury, right knee, initial encounter: Secondary | ICD-10-CM | POA: Diagnosis present

## 2023-03-12 DIAGNOSIS — S83281A Other tear of lateral meniscus, current injury, right knee, initial encounter: Secondary | ICD-10-CM | POA: Diagnosis not present

## 2023-03-12 DIAGNOSIS — M94261 Chondromalacia, right knee: Secondary | ICD-10-CM | POA: Insufficient documentation

## 2023-03-12 HISTORY — PX: KNEE ARTHROSCOPY: SHX127

## 2023-03-12 LAB — GLUCOSE, CAPILLARY
Glucose-Capillary: 206 mg/dL — ABNORMAL HIGH (ref 70–99)
Glucose-Capillary: 149 mg/dL — ABNORMAL HIGH (ref 70–99)

## 2023-03-12 SURGERY — ARTHROSCOPY, KNEE
Anesthesia: General | Site: Knee | Laterality: Right

## 2023-03-12 MED ORDER — SODIUM CHLORIDE 0.9 % IR SOLN
Status: DC | PRN
  Administered 2023-03-12: 6000 mL

## 2023-03-12 MED ORDER — MIDAZOLAM HCL 2 MG/2ML IJ SOLN
INTRAMUSCULAR | Status: DC | PRN
  Administered 2023-03-12: 2 mg via INTRAVENOUS

## 2023-03-12 MED ORDER — EPINEPHRINE PF 1 MG/ML IJ SOLN
INTRAMUSCULAR | Status: DC | PRN
  Administered 2023-03-12: 1 mg

## 2023-03-12 MED ORDER — CHLORHEXIDINE GLUCONATE 0.12 % MT SOLN
15.0000 mL | Freq: Once | OROMUCOSAL | Status: AC
  Administered 2023-03-12: 15 mL via OROMUCOSAL

## 2023-03-12 MED ORDER — LIDOCAINE 2% (20 MG/ML) 5 ML SYRINGE
INTRAMUSCULAR | Status: DC | PRN
  Administered 2023-03-12: 40 mg via INTRAVENOUS

## 2023-03-12 MED ORDER — FENTANYL CITRATE PF 50 MCG/ML IJ SOSY: 25.0000 ug | PREFILLED_SYRINGE | INTRAMUSCULAR | Status: DC | PRN

## 2023-03-12 MED ORDER — PROPOFOL 10 MG/ML IV BOLUS
INTRAVENOUS | Status: DC | PRN
  Administered 2023-03-12 (×2): 200 mg via INTRAVENOUS

## 2023-03-12 MED ORDER — CEFAZOLIN SODIUM-DEXTROSE 2-4 GM/100ML-% IV SOLN
2.0000 g | INTRAVENOUS | Status: AC
  Administered 2023-03-12: 2 g via INTRAVENOUS
  Filled 2023-03-12: qty 100

## 2023-03-12 MED ORDER — OXYCODONE HCL 5 MG PO TABS
5.0000 mg | ORAL_TABLET | Freq: Once | ORAL | Status: AC | PRN
  Administered 2023-03-12: 5 mg via ORAL

## 2023-03-12 MED ORDER — ORAL CARE MOUTH RINSE: 15.0000 mL | Freq: Once | OROMUCOSAL | Status: AC

## 2023-03-12 MED ORDER — ONDANSETRON HCL 4 MG/2ML IJ SOLN
INTRAMUSCULAR | Status: AC
  Filled 2023-03-12: qty 2

## 2023-03-12 MED ORDER — PROPOFOL 10 MG/ML IV BOLUS
INTRAVENOUS | Status: AC
  Filled 2023-03-12: qty 20

## 2023-03-12 MED ORDER — EPINEPHRINE PF 1 MG/ML IJ SOLN
INTRAMUSCULAR | Status: AC
  Filled 2023-03-12: qty 2

## 2023-03-12 MED ORDER — DEXAMETHASONE SODIUM PHOSPHATE 10 MG/ML IJ SOLN
INTRAMUSCULAR | Status: DC | PRN
  Administered 2023-03-12: 4 mg via INTRAVENOUS

## 2023-03-12 MED ORDER — LACTATED RINGERS IV SOLN: INTRAVENOUS | Status: DC

## 2023-03-12 MED ORDER — AMISULPRIDE (ANTIEMETIC) 5 MG/2ML IV SOLN: 10.0000 mg | Freq: Once | INTRAVENOUS | Status: DC | PRN

## 2023-03-12 MED ORDER — BUPIVACAINE-EPINEPHRINE (PF) 0.5% -1:200000 IJ SOLN
INTRAMUSCULAR | Status: AC
  Filled 2023-03-12: qty 30

## 2023-03-12 MED ORDER — ONDANSETRON HCL 4 MG/2ML IJ SOLN
INTRAMUSCULAR | Status: DC | PRN
  Administered 2023-03-12: 4 mg via INTRAVENOUS

## 2023-03-12 MED ORDER — EPHEDRINE 5 MG/ML INJ
INTRAVENOUS | Status: AC
  Filled 2023-03-12: qty 5

## 2023-03-12 MED ORDER — MIDAZOLAM HCL 2 MG/2ML IJ SOLN
INTRAMUSCULAR | Status: AC
  Filled 2023-03-12: qty 2

## 2023-03-12 MED ORDER — ACETAMINOPHEN 500 MG PO TABS
1000.0000 mg | ORAL_TABLET | Freq: Once | ORAL | Status: AC
  Administered 2023-03-12: 1000 mg via ORAL
  Filled 2023-03-12: qty 2

## 2023-03-12 MED ORDER — DEXAMETHASONE SODIUM PHOSPHATE 10 MG/ML IJ SOLN
INTRAMUSCULAR | Status: AC
  Filled 2023-03-12: qty 1

## 2023-03-12 MED ORDER — OXYCODONE HCL 5 MG PO TABS
ORAL_TABLET | ORAL | Status: AC
  Filled 2023-03-12: qty 1

## 2023-03-12 MED ORDER — BUPIVACAINE-EPINEPHRINE 0.5% -1:200000 IJ SOLN
INTRAMUSCULAR | Status: DC | PRN
  Administered 2023-03-12: 30 mL

## 2023-03-12 MED ORDER — TRAMADOL HCL 50 MG PO TABS: 50.0000 mg | ORAL_TABLET | Freq: Four times a day (QID) | ORAL | 0 refills | Status: DC | PRN

## 2023-03-12 MED ORDER — FENTANYL CITRATE (PF) 100 MCG/2ML IJ SOLN
INTRAMUSCULAR | Status: AC
  Filled 2023-03-12: qty 2

## 2023-03-12 MED ORDER — LIDOCAINE HCL (PF) 2 % IJ SOLN
INTRAMUSCULAR | Status: AC
  Filled 2023-03-12: qty 5

## 2023-03-12 MED ORDER — EPHEDRINE SULFATE-NACL 50-0.9 MG/10ML-% IV SOSY
PREFILLED_SYRINGE | INTRAVENOUS | Status: DC | PRN
  Administered 2023-03-12: 5 mg via INTRAVENOUS

## 2023-03-12 MED ORDER — FENTANYL CITRATE (PF) 100 MCG/2ML IJ SOLN
INTRAMUSCULAR | Status: DC | PRN
  Administered 2023-03-12: 50 ug via INTRAVENOUS
  Administered 2023-03-12: 25 ug via INTRAVENOUS

## 2023-03-12 MED ORDER — OXYCODONE HCL 5 MG/5ML PO SOLN: 5.0000 mg | Freq: Once | ORAL | Status: AC | PRN

## 2023-03-12 SURGICAL SUPPLY — 51 items
ANTIFOG SOL W/FOAM PAD STRL (MISCELLANEOUS) ×1
APL PRP STRL LF DISP 70% ISPRP (MISCELLANEOUS) ×1
BAG COUNTER SPONGE SURGICOUNT (BAG) IMPLANT
BAG SPEC THK2 15X12 ZIP CLS (MISCELLANEOUS) ×1
BAG SPNG CNTER NS LX DISP (BAG)
BAG ZIPLOCK 12X15 (MISCELLANEOUS) ×2 IMPLANT
BLADE SURG SZ11 CARB STEEL (BLADE) IMPLANT
BNDG CMPR 5X62 HK CLSR LF (GAUZE/BANDAGES/DRESSINGS) ×1
BNDG ELASTIC 6INX 5YD STR LF (GAUZE/BANDAGES/DRESSINGS) IMPLANT
CHLORAPREP W/TINT 26 (MISCELLANEOUS) ×2 IMPLANT
COVER SURGICAL LIGHT HANDLE (MISCELLANEOUS) ×2 IMPLANT
CUFF TOURN SGL QUICK 34 (TOURNIQUET CUFF) ×1
CUFF TRNQT CYL 34X4.125X (TOURNIQUET CUFF) ×2 IMPLANT
DISSECTOR  3.8MM X 13CM (MISCELLANEOUS) ×1
DISSECTOR 3.8MM X 13CM (MISCELLANEOUS) IMPLANT
DISSECTOR 4.0MM X 13CM (MISCELLANEOUS) ×2 IMPLANT
DRAPE FOOT SWITCH (DRAPES) ×2 IMPLANT
DRAPE SHEET LG 3/4 BI-LAMINATE (DRAPES) ×2 IMPLANT
GAUZE 4X4 16PLY ~~LOC~~+RFID DBL (SPONGE) ×2 IMPLANT
GAUZE PAD ABD 8X10 STRL (GAUZE/BANDAGES/DRESSINGS) ×2 IMPLANT
GAUZE SPONGE 4X4 12PLY STRL (GAUZE/BANDAGES/DRESSINGS) IMPLANT
GAUZE XEROFORM 1X8 LF (GAUZE/BANDAGES/DRESSINGS) IMPLANT
GLOVE BIO SURGEON STRL SZ7 (GLOVE) ×2 IMPLANT
GLOVE BIO SURGEON STRL SZ7.5 (GLOVE) ×2 IMPLANT
GLOVE BIO SURGEON STRL SZ8.5 (GLOVE) ×2 IMPLANT
GLOVE BIOGEL PI IND STRL 7.0 (GLOVE) ×2 IMPLANT
GLOVE BIOGEL PI IND STRL 8 (GLOVE) ×2 IMPLANT
GLOVE BIOGEL PI IND STRL 9 (GLOVE) ×2 IMPLANT
GLOVE INDICATOR 8.0 STRL GRN (GLOVE) ×2 IMPLANT
GOWN STRL REUS W/ TWL XL LVL3 (GOWN DISPOSABLE) ×4 IMPLANT
GOWN STRL REUS W/TWL XL LVL3 (GOWN DISPOSABLE) ×2
IRRIG SUCT STRYKERFLOW 2 WTIP (MISCELLANEOUS) ×1
IRRIGATION SUCT STRKRFLW 2 WTP (MISCELLANEOUS) ×2 IMPLANT
KIT BASIN OR (CUSTOM PROCEDURE TRAY) IMPLANT
KIT TURNOVER KIT A (KITS) IMPLANT
MANIFOLD NEPTUNE II (INSTRUMENTS) ×2 IMPLANT
NDL HYPO 22X1.5 SAFETY MO (MISCELLANEOUS) ×2 IMPLANT
NDL SAFETY ECLIP 18X1.5 (MISCELLANEOUS) ×2 IMPLANT
NDL SPNL 18GX3.5 QUINCKE PK (NEEDLE) ×2 IMPLANT
NEEDLE HYPO 22X1.5 SAFETY MO (MISCELLANEOUS) ×1 IMPLANT
NEEDLE SPNL 18GX3.5 QUINCKE PK (NEEDLE) ×1 IMPLANT
PACK ARTHROSCOPY WL (CUSTOM PROCEDURE TRAY) ×2 IMPLANT
PADDING CAST COTTON 6X4 STRL (CAST SUPPLIES) ×2 IMPLANT
PROTECTOR NERVE ULNAR (MISCELLANEOUS) ×2 IMPLANT
SOLUTION ANTFG W/FOAM PAD STRL (MISCELLANEOUS) IMPLANT
SPONGE T-LAP 4X18 ~~LOC~~+RFID (SPONGE) ×2 IMPLANT
SYR 20ML LL LF (SYRINGE) ×2 IMPLANT
SYR BULB IRRIG 60ML STRL (SYRINGE) ×2 IMPLANT
TOWEL OR 17X26 10 PK STRL BLUE (TOWEL DISPOSABLE) ×2 IMPLANT
TUBING ARTHROSCOPY IRRIG 16FT (MISCELLANEOUS) ×2 IMPLANT
WRAP KNEE MAXI GEL POST OP (GAUZE/BANDAGES/DRESSINGS) ×2 IMPLANT

## 2023-03-12 NOTE — Transfer of Care (Signed)
Immediate Anesthesia Transfer of Care Note  Patient: [name]  Procedure(s) Performed: RIGHT KNEE ARTHROSCOPY, MEDIAL AND LATERAL MENISECTOMY (Right: Knee)  Patient Location: PACU  Anesthesia Type:General  Level of Consciousness: awake, alert , oriented, and patient cooperative  Airway & Oxygen Therapy: Patient Spontanous Breathing and Patient connected to face mask oxygen  Post-op Assessment: Report given to RN, Post -op Vital signs reviewed and stable, and Patient moving all extremities  Post vital signs: Reviewed and stable  Last Vitals:  Vitals Value Taken Time  BP 128/75 03/12/23 1530  Temp    Pulse 82 03/12/23 1531  Resp 16 03/12/23 1531  SpO2 100 % 03/12/23 1531  Vitals shown include unvalidated device data.  Last Pain:  Vitals:   03/12/23 1122  TempSrc:   PainSc: 7       Patients Stated Pain Goal: 3 (03/12/23 1122)  Complications: No notable events documented.

## 2023-03-12 NOTE — Anesthesia Procedure Notes (Signed)
Procedure Name: LMA Insertion Date/Time: 03/12/2023 2:32 PM  Performed by: Sindy Guadeloupe, CRNAPre-anesthesia Checklist: Patient identified, Emergency Drugs available, Suction available, Patient being monitored and Timeout performed Patient Re-evaluated:Patient Re-evaluated prior to induction Oxygen Delivery Method: Circle system utilized Preoxygenation: Pre-oxygenation with 100% oxygen Induction Type: IV induction Ventilation: Mask ventilation without difficulty and Oral airway inserted - appropriate to patient size LMA: LMA inserted LMA Size: 4.0 Number of attempts: 1 Tube secured with: Tape Dental Injury: Teeth and Oropharynx as per pre-operative assessment

## 2023-03-12 NOTE — Interval H&P Note (Signed)
History and Physical Interval Note:  03/12/2023 2:13 PM  [name]  has presented today for surgery, with the diagnosis of right knee medial meniscus tear.  The various methods of treatment have been discussed with the patient and family. After consideration of risks, benefits and other options for treatment, the patient has consented to  Procedure(s): RIGHT KNEE ARTHROSCOPY (Right) as a surgical intervention.  The patient's history has been reviewed, patient examined, no change in status, stable for surgery.  I have reviewed the patient's chart and labs.  Questions were answered to the patient's satisfaction.     Nestor Lewandowsky

## 2023-03-12 NOTE — Op Note (Signed)
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 Turner Daniels M.D.  Assist: Tomi Likens. Gaylene Brooks  (present throughout entire procedure and necessary for timely completion of the procedure) Anes: General LMA  EBL: Minimal  Fluids: 800 cc   Indications: 72 year old male with catching popping and pain in his right knee.  MRI scan shows large posterior horn medial meniscal tear and smaller lateral meniscal tears.. Pt has failed conservative treatment with anti-inflammatory medicines, physical therapy, and modified activites but did get good temporarily from an intra-articular cortisone injection. Pain has recurred and patient desires elective arthroscopic evaluation and treatment of knee. Risks and benefits of surgery have been discussed and questions answered.  Procedure: Patient identified by arm band and taken to the operating room at the day surgery Center. The appropriate anesthetic monitors were attached, and General LMA anesthesia was induced without difficulty. Lateral post was applied to the table and the lower extremity was prepped and draped in usual sterile fashion from the ankle to the midthigh. Time out procedure was performed. We began the operation by making standard inferior lateral and inferior medial peripatellar portals with a #11 blade allowing introduction of the arthroscope through the inferior lateral portal and the out flow to the inferior medial portal. Pump pressure was set at 100 mmHg and diagnostic arthroscopy  revealed minimal chondromalacia of the patella.  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  Moving the medial compartment large double parrot-beak tear  of the posterior horn the medial meniscus was identified and removed using the dissector sucker shaver.  The remnant was then probed and found to be stable.  There was minimal chondromalacia noted to the tibia over the femur medially moving to the notch the ACL was 1+ lax but intact the PCL was intact.  Limited lateral compartment large complex tearing of the lateral and posterior horns of the lateral meniscus was identified and debrided back to a stable margin.  The gutters were cleared medially and laterally.. The knee was irrigated out normal saline solution. A dressing of xerofoam 4 x 4 dressing sponges, web roll and an Ace wrap was applied. The patient was awakened extubated and taken to the recovery without difficulty.    Signed: Nestor Lewandowsky, MD

## 2023-03-13 ENCOUNTER — Encounter (HOSPITAL_COMMUNITY): Payer: Self-pay | Admitting: Orthopedic Surgery

## 2023-03-13 NOTE — Anesthesia Postprocedure Evaluation (Signed)
Anesthesia Post Note  Patient: Burrel Legrand  Procedure(s) Performed: RIGHT KNEE ARTHROSCOPY, MEDIAL AND LATERAL MENISECTOMY (Right: Knee)     Patient location during evaluation: PACU Anesthesia Type: General Level of consciousness: awake and alert Pain management: pain level controlled Vital Signs Assessment: post-procedure vital signs reviewed and stable Respiratory status: spontaneous breathing, nonlabored ventilation and respiratory function stable Cardiovascular status: stable and blood pressure returned to baseline Anesthetic complications: no   No notable events documented.  Last Vitals:  Vitals:   03/12/23 1615 03/12/23 1630  BP: (!) 140/82 (!) 148/85  Pulse: 70 68  Resp: 20   Temp: (!) 36.4 C   SpO2: 99% 99%    Last Pain:  Vitals:   03/12/23 1630  TempSrc:   PainSc: 1                  Beryle Lathe

## 2023-03-20 DIAGNOSIS — M25661 Stiffness of right knee, not elsewhere classified: Secondary | ICD-10-CM | POA: Diagnosis not present

## 2023-03-20 DIAGNOSIS — R531 Weakness: Secondary | ICD-10-CM | POA: Diagnosis not present

## 2023-04-03 ENCOUNTER — Ambulatory Visit
Admission: EM | Admit: 2023-04-03 | Discharge: 2023-04-03 | Disposition: A | Discharge status: Home / Self Care | Payer: Medicare Other | Attending: Nurse Practitioner | Admitting: Nurse Practitioner

## 2023-04-03 DIAGNOSIS — N390 Urinary tract infection, site not specified: Secondary | ICD-10-CM | POA: Diagnosis not present

## 2023-04-03 LAB — POCT URINALYSIS DIP (MANUAL ENTRY)
Bilirubin, UA: NEGATIVE
Glucose, UA: NEGATIVE mg/dL
Ketones, POC UA: NEGATIVE mg/dL
Nitrite, UA: NEGATIVE
Spec Grav, UA: >=1.03 — AB (ref 1.010–1.025)
Urobilinogen, UA: 0.2 E.U./dL
pH, UA: 5.5 (ref 5.0–8.0)

## 2023-04-03 MED ORDER — CEFPODOXIME PROXETIL 200 MG PO TABS: 200.0000 mg | ORAL_TABLET | Freq: Two times a day (BID) | ORAL | 0 refills | Status: DC

## 2023-04-03 NOTE — ED Provider Notes (Signed)
[facility]    CSN: [id number] Arrival date & time: 04/03/23  1753      History   Chief Complaint No chief complaint on file.   HPI [name] is a 72 y.o. male.   Patient presents today with 1 day history of dysuria, cloudy urine, chills, and fever.  He denies urinary frequency, urinary urgency, voiding smaller amounts, hematuria, abdominal pain, and new back pain.  No nausea or vomiting.  No penile discharge.  Reports he took ibuprofen earlier today which did help with the fever.  Patient reports history of UTI, enlarged prostate.    Last UTI was approximately 6 months ago, was treated with Vantin.  Urine culture showed resistance to ampicillin, Zosyn, and tetracyclines.  Patient has history of myasthenia gravis and has significant contraindications for starting antibiotics including fluoroquinolones.  Patient reports he has a follow-up scheduled with Dr. Marcellina Millin urologist-tomorrow.    Past Medical History:  Diagnosis Date   Candida esophagitis (HCC) 08/2013   Deafness in right ear    Diabetes mellitus without complication (HCC)    Difficult intubation    Esophageal stricture 07/2010   FOOD IMPACTION-->SAV DIL 16 MM   GERD (gastroesophageal reflux disease)    History of kidney stones    Hypertension    Hypothyroidism    Kidney stone    Myasthenia gravis (HCC)    Sleep apnea    No CPAP   Thyroid disease     Patient Active Problem List   Diagnosis Date Noted   Acute medial meniscus tear of right knee 03/09/2023   Post-viral cough syndrome 08/02/2022   Nocturia 04/05/2021   Erectile dysfunction due to arterial insufficiency 04/05/2021   Type 2 diabetes mellitus with hyperglycemia, without long-term current use of insulin (HCC)    Acute respiratory disease due to COVID-19 virus 12/21/2020   Pneumonia due to COVID-19 virus 12/21/2020   Acute respiratory failure with hypoxia Due to Covid 12/21/2020   Chronic anticoagulation 12/21/2020   Dysuria  10/19/2020   Acute prostatitis 10/19/2020   Benign prostatic hyperplasia with urinary obstruction 10/19/2020   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 10/19/2020   Persistent atrial fibrillation (HCC) 07/01/2020   Secondary hypercoagulable state (HCC) 07/01/2020   Dysphagia    Plantar fasciitis 09/04/2017   Cough 03/03/2017   Bacterial pharyngitis 03/03/2017   Colon cancer screening 06/03/2014   Diverticulitis large intestine 04/01/2014   Diarrhea 04/01/2014   Candida esophagitis (HCC) 11/26/2013   UTI (lower urinary tract infection) 06/07/2012   Myasthenia gravis (HCC)    Hypertension    GERD (gastroesophageal reflux disease) 03/02/2011   Esophageal stricture 07/21/2010    Past Surgical History:  Procedure Laterality Date   ATRIAL FIBRILLATION ABLATION N/A 08/12/2020   Procedure: ATRIAL FIBRILLATION ABLATION;  Surgeon: Lanier Prude, MD;  Location: [facility];  Service: Cardiovascular;  Laterality: N/A;   BIOPSY  02/14/2018   Procedure: BIOPSY;  Surgeon: West Bali, MD;  Location: [facility];  Service: Endoscopy;;  gastric   CARDIOVERSION N/A 06/08/2020   Procedure: CARDIOVERSION;  Surgeon: Antoine Poche, MD;  Location: [facility];  Service: Endoscopy;  Laterality: N/A;   CARDIOVERSION N/A 09/16/2020   Procedure: CARDIOVERSION;  Surgeon: Meriam Sprague, MD;  Location: [facility] ENDOSCOPY;  Service: Cardiovascular;  Laterality: N/A;   CHOLECYSTECTOMY     COLONOSCOPY  2005   Dr. Lovell Sheehan   COLONOSCOPY N/A 07/17/2014   Procedure: COLONOSCOPY;  Surgeon: West Bali, MD;  Location: [facility];  Service: Endoscopy;  Laterality: N/A;  9:30-rescheduled 8/28 same time Soledad Gerlach to notify pt   ESOPHAGOGASTRODUODENOSCOPY N/A 07/25/2013   Shallow ulcer in the cardia, mid esophageal web, stricture at GE junction   ESOPHAGOGASTRODUODENOSCOPY N/A 02/14/2018   Procedure: ESOPHAGOGASTRODUODENOSCOPY (EGD);  Surgeon: West Bali, MD;  Location: [facility];   Service: Endoscopy;  Laterality: N/A;  11:15am   ESOPHAGOGASTRODUODENOSCOPY (EGD) WITH ESOPHAGEAL DILATION N/A 08/29/2013   Candida esophagitis, small hiatal hernia, moderate nonerosive gastritis, mid esophageal web   HERNIA REPAIR     right inguinal   KIDNEY STONE SURGERY     KNEE ARTHROSCOPY Right 03/12/2023   Procedure: RIGHT KNEE ARTHROSCOPY, MEDIAL AND LATERAL MENISECTOMY;  Surgeon: Gean Birchwood, MD;  Location: [facility];  Service: Orthopedics;  Laterality: Right;   NASAL SEPTUM SURGERY     PALATE SURGERY         SAVORY DILATION N/A 02/14/2018   Procedure: SAVORY DILATION;  Surgeon: West Bali, MD;  Location: [facility];  Service: Endoscopy;  Laterality: N/A;   TEE WITHOUT CARDIOVERSION N/A 08/10/2020   Procedure: TRANSESOPHAGEAL ECHOCARDIOGRAM (TEE);  Surgeon: Chilton Si, MD;  Location[facility];  Service: Cardiovascular;  Laterality: N/A;   THYMECTOMY     TONSILLECTOMY     UPPER GASTROINTESTINAL ENDOSCOPY  SEP 2011   SAV DIL       Home Medications    Prior to Admission medications   Medication Sig Start Date End Date Taking? Authorizing Provider  cefpodoxime (VANTIN) 200 MG tablet Take 1 tablet (200 mg total) by mouth 2 (two) times daily for 7 days. 04/03/23 04/10/23 Yes Valentino Nose, NP  acetaminophen (TYLENOL) 325 MG tablet Take 650 mg by mouth every 6 (six) hours as needed for moderate pain.    [provider]  acyclovir (ZOVIRAX) 400 MG tablet Take 800 mg by mouth daily.    [provider]  apixaban (ELIQUIS) 5 MG TABS tablet TAKE (1) TABLET BY MOUTH TWICE DAILY. 06/14/22   Lanier Prude, MD  Ascorbic Acid (VITAMIN C PO) Take 4,000 mg by mouth daily.    [provider]  azaTHIOprine (IMURAN) 50 MG tablet Take 150 mg by mouth daily.     [provider]  Calcium Carbonate-Vit D-Min (CALCIUM 600+D3 PLUS MINERALS) 600-800 MG-UNIT TABS Take 2 tablets by mouth daily.    [provider]  cetirizine (ZYRTEC) 10  MG tablet Take 10 mg by mouth daily.    [provider]  fluticasone (FLONASE) 50 MCG/ACT nasal spray Place 2 sprays into both nostrils daily. Patient not taking: Reported on 02/27/2023 08/02/22   Noemi Chapel, NP  glimepiride (AMARYL) 1 MG tablet Take 2 mg by mouth 2 (two) times daily.    [provider]  ibuprofen (ADVIL) 200 MG tablet Take 400 mg by mouth daily as needed for moderate pain.    [provider]  losartan (COZAAR) 50 MG tablet TAKE (1) TABLET BY MOUTH ONCE DAILY. 06/06/22   Lanier Prude, MD  metFORMIN (GLUCOPHAGE-XR) 500 MG 24 hr tablet Take 1,000 mg by mouth 2 (two) times daily.    [provider]  Omega-3 Fatty Acids (FISH OIL ULTRA) 1400 MG CAPS Take 1,400 mg by mouth daily after lunch.    [provider]  omeprazole (PRILOSEC) 20 MG capsule Take 20 mg by mouth daily.    [provider]  [facility] VERIO test strip 1 each 3 (three) times daily. 08/05/20   [provider]  Saw Palmetto 450 MG CAPS Take 2,250 mg by mouth daily after lunch.    [provider]  SYNTHROID 137 MCG tablet Take 137 mcg by mouth daily before breakfast. 07/03/13   [provider]  tadalafil (CIALIS) 20 MG tablet Take 1 tablet (20 mg total) by mouth as needed. 04/05/22   McKenzie, Mardene Celeste, MD  tamsulosin (FLOMAX) 0.4 MG CAPS capsule Take 1 capsule (0.4 mg total) by mouth 2 (two) times daily. 04/05/22   McKenzie, Mardene Celeste, MD  traMADol (ULTRAM) 50 MG tablet Take 1 tablet (50 mg total) by mouth every 6 (six) hours as needed. 03/12/23 03/11/24  Allena Katz, PA-C  vitamin E 180 MG (400 UNITS) capsule Take 800 Units by mouth daily after lunch.    [provider]    Family History Family History  Problem Relation Age of Onset   Liver cancer Father        age 55, deceased   Colon cancer Neg Hx     Social History Social History   Tobacco Use   Smoking status: Never   Smokeless tobacco: Never  Vaping Use    Vaping Use: Never used  Substance Use Topics   Alcohol use: No   Drug use: No     Allergies   Levofloxacin, Tequin [gatifloxacin], Iodinated contrast media, Levothyroxine sodium, Limonene, Magnesium-containing compounds, Aminoglycosides, Avelox [moxifloxacin hcl in nacl], Botox [onabotulinumtoxina], Botulinum toxins, Calcium channel blockers, Ciprofloxacin, Curare [tubocurarine], Dysport [abobotulinumtoxina], Erythromycin, Factive [gemifloxacin], Floxin [ofloxacin], Gentamycin [gentamicin], Interferons, Kanamycin, Ketek [telithromycin], Levaquin [levofloxacin in d5w], Levofloxacin, Macrolides and ketolides, Myobloc [rimabotulinumtoxinb], Neomycin, Noroxin [norfloxacin], Other, Penicillamine, Procainamide, Propranolol, Quinidine, Quinine derivatives, Streptomycin, Timolol, Tobramycin, Xeomin [incobotulinumtoxina], and Zithromax [azithromycin]   Review of Systems Review of Systems Per HPI  Physical Exam Triage Vital Signs ED Triage Vitals  Enc Vitals Group     BP 04/03/23 1904 (!) 182/74     Pulse Rate 04/03/23 1904 (!) 105     Resp 04/03/23 1904 18     Temp 04/03/23 1904 99.5 F (37.5 C)     Temp Source 04/03/23 1904 Oral     SpO2 04/03/23 1904 95 %     Weight --      Height --      Head Circumference --      Peak Flow --      Pain Score 04/03/23 1906 5     Pain Loc --      Pain Edu? --      Excl. in GC? --    No data found.  Updated Vital Signs BP (!) 182/74 (BP Location: Right Arm)   Pulse (!) 105   Temp 99.5 F (37.5 C) (Oral)   Resp 18   SpO2 95%   Visual Acuity Right Eye Distance:   Left Eye Distance:   Bilateral Distance:    Right Eye Near:   Left Eye Near:    Bilateral Near:     Physical Exam Vitals and nursing note reviewed.  Constitutional:      General: He is not in acute distress.    Appearance: Normal appearance. He is not toxic-appearing.  HENT:     Mouth/Throat:     Mouth: Mucous membranes are moist.     Pharynx: Oropharynx is clear.   Cardiovascular:     Rate and Rhythm: Normal rate and regular rhythm.  Pulmonary:     Effort: Pulmonary effort is normal. No respiratory distress.     Breath sounds:  Normal breath sounds. No wheezing, rhonchi or rales.  Abdominal:     General: Abdomen is flat. Bowel sounds are normal. There is no distension.     Palpations: Abdomen is soft.     Tenderness: There is no abdominal tenderness. There is no right CVA tenderness, left CVA tenderness or guarding.  Skin:    General: Skin is warm and dry.     Coloration: Skin is not jaundiced or pale.     Findings: No erythema.  Neurological:     Mental Status: He is alert and oriented to person, place, and time.  Psychiatric:        Behavior: Behavior is cooperative.      UC Treatments / Results  Labs (all labs ordered are listed, but only abnormal results are displayed) Labs Reviewed  POCT URINALYSIS DIP (MANUAL ENTRY) - Abnormal; Notable for the following components:      Result Value   Spec Grav, UA >=1.030 (*)    Blood, UA trace-intact (*)    Protein Ur, POC trace (*)    Leukocytes, UA Large (3+) (*)    All other components within normal limits  URINE CULTURE    EKG   Radiology No results found.  Procedures Procedures (including critical care time)  Medications Ordered in UC Medications - No data to display  Initial Impression / Assessment and Plan / UC Course  I have reviewed the triage vital signs and the nursing notes.  Pertinent labs & imaging results that were available during my care of the patient were reviewed by me and considered in my medical decision making (see chart for details).   Patient is well-appearing, afebrile, not tachypneic, oxygenating well on room air.  Patient is mildly hypertensive and tachycardic today in triage.  This is likely secondary to the UTI.  1. Complicated UTI (urinary tract infection) Treat with Vantin twice daily for 7 days Urine culture is pending Strict ER precautions  discussed with patient at length; patient states his wife will monitor him closely and he feels safe for discharge Patient has close follow-up arranged with urology  The patient was given the opportunity to ask questions.  All questions answered to their satisfaction.  The patient is in agreement to this plan.    Final Clinical Impressions(s) / UC Diagnoses   Final diagnoses:  Complicated UTI (urinary tract infection)     Discharge Instructions      The urine sample today is suggestive of a urinary tract infection.  Please take the cefpodoxime twice daily for 7 days to treat it.  We will call you if the urine culture shows we need to change the antibiotic.  Continue Tylenol as needed for fever/body aches or chills.  If you develop severe pain, nausea/vomiting and are unable to keep fluids down, or altered mental status, please go straight to the hospital or call 911.     ED Prescriptions     Medication Sig Dispense Auth. Provider   cefpodoxime (VANTIN) 200 MG tablet Take 1 tablet (200 mg total) by mouth 2 (two) times daily for 7 days. 14 tablet Valentino Nose, NP      PDMP not reviewed this encounter.   Valentino Nose, NP 04/03/23 1949

## 2023-04-03 NOTE — Discharge Instructions (Addendum)
The urine sample today is suggestive of a urinary tract infection.  Please take the cefpodoxime twice daily for 7 days to treat it.  We will call you if the urine culture shows we need to change the antibiotic.  Continue Tylenol as needed for fever/body aches or chills.  If you develop severe pain, nausea/vomiting and are unable to keep fluids down, or altered mental status, please go straight to the hospital or call 911.

## 2023-04-03 NOTE — ED Triage Notes (Signed)
Pt states he has UTI sx's pt states the sx's started this afternoon. Burning with urination, cloudy, back pain, chills fever

## 2023-04-04 ENCOUNTER — Ambulatory Visit: Payer: Medicare Other | Admitting: Urology

## 2023-04-04 ENCOUNTER — Telehealth: Payer: Self-pay | Admitting: Emergency Medicine

## 2023-04-04 VITALS — BP 147/83 | HR 98

## 2023-04-04 DIAGNOSIS — N411 Chronic prostatitis: Secondary | ICD-10-CM | POA: Diagnosis not present

## 2023-04-04 DIAGNOSIS — R351 Nocturia: Secondary | ICD-10-CM | POA: Diagnosis not present

## 2023-04-04 DIAGNOSIS — N138 Other obstructive and reflux uropathy: Secondary | ICD-10-CM | POA: Diagnosis not present

## 2023-04-04 DIAGNOSIS — N401 Enlarged prostate with lower urinary tract symptoms: Secondary | ICD-10-CM

## 2023-04-04 DIAGNOSIS — N5201 Erectile dysfunction due to arterial insufficiency: Secondary | ICD-10-CM

## 2023-04-04 MED ORDER — CEFPODOXIME PROXETIL 200 MG PO TABS: 200.0000 mg | ORAL_TABLET | Freq: Two times a day (BID) | ORAL | 0 refills | Status: AC

## 2023-04-04 MED ORDER — TADALAFIL 20 MG PO TABS: 20.0000 mg | ORAL_TABLET | ORAL | 5 refills | Status: DC | PRN

## 2023-04-04 MED ORDER — TAMSULOSIN HCL 0.4 MG PO CAPS: 0.4000 mg | ORAL_CAPSULE | Freq: Two times a day (BID) | ORAL | 3 refills | Status: DC

## 2023-04-04 NOTE — Progress Notes (Unsigned)
/  04/04/2023 1:54 PM   [name] 1951-06-04 [id number]  Referring provider: Assunta Found, MD [address] [place],  [place] [address]  Chief Complaint  Patient presents with   Follow-up    HPI: Mr Havner is a 71yo here for followup for chronic prostatitis, BPH and erectile dysfunction. He presented to the ER yesterday and was diagnosed with a UTI. He was started on Vantin.  IPSS 0 QOL 0 on flomax 0.4mg  BID. Erectile dysfunction is improved with tadalafil prn   PMH: Past Medical History:  Diagnosis Date   Candida esophagitis (HCC) 08/2013   Deafness in right ear    Diabetes mellitus without complication (HCC)    Difficult intubation    Esophageal stricture 07/2010   FOOD IMPACTION-->SAV DIL 16 MM   GERD (gastroesophageal reflux disease)    History of kidney stones    Hypertension    Hypothyroidism    Kidney stone    Myasthenia gravis (HCC)    Sleep apnea    No CPAP   Thyroid disease     Surgical History: Past Surgical History:  Procedure Laterality Date   ATRIAL FIBRILLATION ABLATION N/A 08/12/2020   Procedure: ATRIAL FIBRILLATION ABLATION;  Surgeon: Lanier Prude, MD;  Location: [facility];  Service: Cardiovascular;  Laterality: N/A;   BIOPSY  02/14/2018   Procedure: BIOPSY;  Surgeon: West Bali, MD;  Location: [facility];  Service: Endoscopy;;  gastric   CARDIOVERSION N/A 06/08/2020   Procedure: CARDIOVERSION;  Surgeon: Antoine Poche, MD;  Location: [facility];  Service: Endoscopy;  Laterality: N/A;   CARDIOVERSION N/A 09/16/2020   Procedure: CARDIOVERSION;  Surgeon: Meriam Sprague, MD;  Location: [facility] ENDOSCOPY;  Service: Cardiovascular;  Laterality: N/A;   CHOLECYSTECTOMY     COLONOSCOPY  2005   Dr. Lovell Sheehan   COLONOSCOPY N/A 07/17/2014   Procedure: COLONOSCOPY;  Surgeon: West Bali, MD;  Location: [facility];  Service: Endoscopy;  Laterality: N/A;  9:30-rescheduled 8/28 same time Soledad Gerlach to notify pt    ESOPHAGOGASTRODUODENOSCOPY N/A 07/25/2013   Shallow ulcer in the cardia, mid esophageal web, stricture at GE junction   ESOPHAGOGASTRODUODENOSCOPY N/A 02/14/2018   Procedure: ESOPHAGOGASTRODUODENOSCOPY (EGD);  Surgeon: West Bali, MD;  Location: [facility];  Service: Endoscopy;  Laterality: N/A;  11:15am   ESOPHAGOGASTRODUODENOSCOPY (EGD) WITH ESOPHAGEAL DILATION N/A 08/29/2013   Candida esophagitis, small hiatal hernia, moderate nonerosive gastritis, mid esophageal web   HERNIA REPAIR     right inguinal   KIDNEY STONE SURGERY     KNEE ARTHROSCOPY Right 03/12/2023   Procedure: RIGHT KNEE ARTHROSCOPY, MEDIAL AND LATERAL MENISECTOMY;  Surgeon: Gean Birchwood, MD;  Location: [facility];  Service: Orthopedics;  Laterality: Right;   NASAL SEPTUM SURGERY     PALATE SURGERY         SAVORY DILATION N/A 02/14/2018   Procedure: SAVORY DILATION;  Surgeon: West Bali, MD;  Location: [facility];  Service: Endoscopy;  Laterality: N/A;   TEE WITHOUT CARDIOVERSION N/A 08/10/2020   Procedure: TRANSESOPHAGEAL ECHOCARDIOGRAM (TEE);  Surgeon: Chilton Si, MD;  Location: [facility] ENDOSCOPY;  Service: Cardiovascular;  Laterality: N/A;   THYMECTOMY     TONSILLECTOMY     UPPER GASTROINTESTINAL ENDOSCOPY  SEP 2011   SAV DIL    Home Medications:  Allergies as of 04/04/2023       Reactions   Levofloxacin Anaphylaxis   Tequin [gatifloxacin] Anaphylaxis   Iodinated Contrast Media    MD told pt  cannot have   Levothyroxine Sodium Other (See Comments)   Myasthenia Gravis (MG) Medication Alert   Limonene Other (See Comments), Rash   REACTION:Myasthenia Gravis (MG) Medication Alert REACTION:Myasthenia Gravis (MG) Medication Alert   Magnesium-containing Compounds Other (See Comments), Rash   Unknown Unknown Unknown   Aminoglycosides Other (See Comments)   REACTION: Myasthenia Gravis (MG) Medication Alert   Avelox [moxifloxacin Hcl In Nacl] Other (See Comments)   REACTION: FLUOROQUINOLONE ANTIBIOTICS:  Myasthenia Gravis (MG) Medication Alert   Botox [onabotulinumtoxina] Other (See Comments)   REACTION: Myasthenia Gravis (MG) Medication Alert   Botulinum Toxins Other (See Comments)   REACTION: Myasthenia Gravis (MG) Medication Alert   Calcium Channel Blockers Other (See Comments)   (BLOOD PRESSURE MEDICATION) REACTION: Myasthenia Gravis (MG) Medication Alert   Ciprofloxacin Other (See Comments)   REACTION: FLUOROQUINOLONE ANTIBIOTICS: Myasthenia Gravis (MG) Medication Alert   Curare [tubocurarine] Other (See Comments)   (Usually only used during surgery)  REACTION: Myasthenia Gravis (MG) Medication Alert   Dysport [abobotulinumtoxina] Other (See Comments)   REACTION: Myasthenia Gravis (MG) Medication Alert   Erythromycin Other (See Comments)   REACTION: Myasthenia Gravis (MG) Medication Alert   Factive [gemifloxacin] Other (See Comments)   REACTION: FLUOROQUINOLONE ANTIBIOTICS: Myasthenia Gravis (MG) Medication Alert   Floxin [ofloxacin] Other (See Comments)   REACTION: FLUOROQUINOLONE ANTIBIOTICS: Myasthenia Gravis (MG) Medication Alert   Gentamycin [gentamicin] Other (See Comments)   REACTION:Myasthenia Gravis (MG) Medication Alert   Interferons Other (See Comments)   REACTION: Myasthenia Gravis (MG) Medication Alert   Kanamycin Other (See Comments)   REACTION: Myasthenia Gravis (MG) Medication Alert   Ketek [telithromycin] Other (See Comments)   REACTION: Myasthenia Gravis (MG) Medication Alert   Levaquin [levofloxacin In D5w] Other (See Comments)   REACTION: FLUOROQUINOLONE ANTIBIOTICS: Myasthenia Gravis (MG) Medication Alert   Levofloxacin Other (See Comments)   Myasthenia Gravis (MG) Medication Alert   Macrolides And Ketolides Other (See Comments)   REACTION: Myasthenia Gravis (MG) Medication Alert REACTION: Myasthenia Gravis (MG) Medication Alert   Myobloc [rimabotulinumtoxinb] Other (See Comments)   REACTION: Myasthenia Gravis (MG) Medication Alert   Neomycin Other (See  Comments)   REACTION: Myasthenia Gravis (MG) Medication Alert   Noroxin [norfloxacin] Other (See Comments)   REACTION: FLUOROQUINOLONE ANTIBIOTICS: Myasthenia Gravis (MG) Medication Alert   Other Other (See Comments)   MAGNESIUM SALTS: Milk of Magnesia, some antacids (Maalox, Mylanta) REACTION: Myasthenia Gravis (MG) Medication Alerta   Penicillamine Other (See Comments)   D-PENICILLAMINE *DO NOT CONFUSE WITH PENICILLIN*  REACTION: Myasthenia Gravis (MG) Medication Alert   Procainamide Other (See Comments)   REACTION: Myasthenia Gravis (MG) Medication Alert   Propranolol Other (See Comments)   REACTION: Myasthenia Gravis (MG) Medication Alert   Quinidine Other (See Comments)   REACTION: Myasthenia Gravis (MG) Medication Alert   Quinine Derivatives Other (See Comments)   REACTION: Myasthenia Gravis (MG) Medication Alert   Streptomycin Other (See Comments)   REACTION:  Myasthenia Gravis (MG) Medication Alert   Timolol Other (See Comments)   REACTION: Myasthenia Gravis (MG) Medication Alert   Tobramycin Other (See Comments)   REACTION: Myasthenia Gravis (MG) Medication Alert   Xeomin [incobotulinumtoxina] Other (See Comments)   REACTION: Myasthenia Gravis (MG) Medication Alert   Zithromax [azithromycin] Other (See Comments)   REACTION: Myasthenia Gravis (MG) Medication Alert        Medication List        Accurate as of Apr 04, 2023  1:54 PM. If you have any questions, ask your nurse or doctor.  acetaminophen 325 MG tablet Commonly known as: TYLENOL Take 650 mg by mouth every 6 (six) hours as needed for moderate pain.   acyclovir 400 MG tablet Commonly known as: ZOVIRAX Take 800 mg by mouth daily.   azaTHIOprine 50 MG tablet Commonly known as: IMURAN Take 150 mg by mouth daily.   Calcium 600+D3 Plus Minerals 600-800 MG-UNIT Tabs Take 2 tablets by mouth daily.   cefpodoxime 200 MG tablet Commonly known as: VANTIN Take 1 tablet (200 mg total) by mouth 2  (two) times daily for 7 days.   cetirizine 10 MG tablet Commonly known as: ZYRTEC Take 10 mg by mouth daily.   Eliquis 5 MG Tabs tablet Generic drug: apixaban TAKE (1) TABLET BY MOUTH TWICE DAILY.   Fish Oil Ultra 1400 MG Caps Take 1,400 mg by mouth daily after lunch.   fluticasone 50 MCG/ACT nasal spray Commonly known as: FLONASE Place 2 sprays into both nostrils daily.   glimepiride 1 MG tablet Commonly known as: AMARYL Take 2 mg by mouth 2 (two) times daily.   ibuprofen 200 MG tablet Commonly known as: ADVIL Take 400 mg by mouth daily as needed for moderate pain.   losartan 50 MG tablet Commonly known as: COZAAR TAKE (1) TABLET BY MOUTH ONCE DAILY.   metFORMIN 500 MG 24 hr tablet Commonly known as: GLUCOPHAGE-XR Take 1,000 mg by mouth 2 (two) times daily.   omeprazole 20 MG capsule Commonly known as: PRILOSEC Take 20 mg by mouth daily.   OneTouch Verio test strip Generic drug: glucose blood 1 each 3 (three) times daily.   Saw Palmetto 450 MG Caps Take 2,250 mg by mouth daily after lunch.   Synthroid 137 MCG tablet Generic drug: levothyroxine Take 137 mcg by mouth daily before breakfast.   tadalafil 20 MG tablet Commonly known as: CIALIS Take 1 tablet (20 mg total) by mouth as needed.   tamsulosin 0.4 MG Caps capsule Commonly known as: FLOMAX Take 1 capsule (0.4 mg total) by mouth 2 (two) times daily.   traMADol 50 MG tablet Commonly known as: Ultram Take 1 tablet (50 mg total) by mouth every 6 (six) hours as needed.   VITAMIN C PO Take 4,000 mg by mouth daily.   vitamin E 180 MG (400 UNITS) capsule Take 800 Units by mouth daily after lunch.        Allergies:  Allergies  Allergen Reactions   Levofloxacin Anaphylaxis   Tequin [Gatifloxacin] Anaphylaxis   Iodinated Contrast Media     MD told pt cannot have    Levothyroxine Sodium Other (See Comments)    Myasthenia Gravis (MG) Medication Alert   Limonene Other (See Comments) and Rash     REACTION:Myasthenia Gravis (MG) Medication Alert REACTION:Myasthenia Gravis (MG) Medication Alert   Magnesium-Containing Compounds Other (See Comments) and Rash    Unknown Unknown Unknown   Aminoglycosides Other (See Comments)    REACTION: Myasthenia Gravis (MG) Medication Alert   Avelox [Moxifloxacin Hcl In Nacl] Other (See Comments)    REACTION: FLUOROQUINOLONE ANTIBIOTICS: Myasthenia Gravis (MG) Medication Alert   Botox [Onabotulinumtoxina] Other (See Comments)    REACTION: Myasthenia Gravis (MG) Medication Alert   Botulinum Toxins Other (See Comments)    REACTION: Myasthenia Gravis (MG) Medication Alert   Calcium Channel Blockers Other (See Comments)    (BLOOD PRESSURE MEDICATION) REACTION: Myasthenia Gravis (MG) Medication Alert   Ciprofloxacin Other (See Comments)    REACTION: FLUOROQUINOLONE ANTIBIOTICS: Myasthenia Gravis (MG) Medication Alert   Curare [Tubocurarine] Other (See Comments)    (  Usually only used during surgery)  REACTION: Myasthenia Gravis (MG) Medication Alert   Dysport [Abobotulinumtoxina] Other (See Comments)    REACTION: Myasthenia Gravis (MG) Medication Alert   Erythromycin Other (See Comments)    REACTION: Myasthenia Gravis (MG) Medication Alert   Factive [Gemifloxacin] Other (See Comments)    REACTION: FLUOROQUINOLONE ANTIBIOTICS: Myasthenia Gravis (MG) Medication Alert   Floxin [Ofloxacin] Other (See Comments)    REACTION: FLUOROQUINOLONE ANTIBIOTICS: Myasthenia Gravis (MG) Medication Alert   Gentamycin [Gentamicin] Other (See Comments)    REACTION:Myasthenia Gravis (MG) Medication Alert   Interferons Other (See Comments)    REACTION: Myasthenia Gravis (MG) Medication Alert   Kanamycin Other (See Comments)    REACTION: Myasthenia Gravis (MG) Medication Alert   Ketek [Telithromycin] Other (See Comments)    REACTION: Myasthenia Gravis (MG) Medication Alert   Levaquin [Levofloxacin In D5w] Other (See Comments)    REACTION: FLUOROQUINOLONE ANTIBIOTICS:  Myasthenia Gravis (MG) Medication Alert   Levofloxacin Other (See Comments)    Myasthenia Gravis (MG) Medication Alert   Macrolides And Ketolides Other (See Comments)    REACTION: Myasthenia Gravis (MG) Medication Alert REACTION: Myasthenia Gravis (MG) Medication Alert   Myobloc [Rimabotulinumtoxinb] Other (See Comments)    REACTION: Myasthenia Gravis (MG) Medication Alert   Neomycin Other (See Comments)    REACTION: Myasthenia Gravis (MG) Medication Alert   Noroxin [Norfloxacin] Other (See Comments)    REACTION: FLUOROQUINOLONE ANTIBIOTICS: Myasthenia Gravis (MG) Medication Alert   Other Other (See Comments)    MAGNESIUM SALTS: Milk of Magnesia, some antacids (Maalox, Mylanta) REACTION: Myasthenia Gravis (MG) Medication Alerta   Penicillamine Other (See Comments)    D-PENICILLAMINE *DO NOT CONFUSE WITH PENICILLIN*  REACTION: Myasthenia Gravis (MG) Medication Alert   Procainamide Other (See Comments)    REACTION: Myasthenia Gravis (MG) Medication Alert   Propranolol Other (See Comments)    REACTION: Myasthenia Gravis (MG) Medication Alert   Quinidine Other (See Comments)    REACTION: Myasthenia Gravis (MG) Medication Alert   Quinine Derivatives Other (See Comments)    REACTION: Myasthenia Gravis (MG) Medication Alert   Streptomycin Other (See Comments)    REACTION:  Myasthenia Gravis (MG) Medication Alert   Timolol Other (See Comments)    REACTION: Myasthenia Gravis (MG) Medication Alert   Tobramycin Other (See Comments)    REACTION: Myasthenia Gravis (MG) Medication Alert   Xeomin [Incobotulinumtoxina] Other (See Comments)    REACTION: Myasthenia Gravis (MG) Medication Alert   Zithromax [Azithromycin] Other (See Comments)    REACTION: Myasthenia Gravis (MG) Medication Alert    Family History: Family History  Problem Relation Age of Onset   Liver cancer Father        age 80, deceased   Colon cancer Neg Hx     Social History:  reports that he has never smoked. He has  never used smokeless tobacco. He reports that he does not drink alcohol and does not use drugs.  ROS: All other review of systems were reviewed and are negative except what is noted above in HPI  Physical Exam: BP (!) 147/83   Pulse 98   Constitutional:  Alert and oriented, No acute distress. HEENT: Advance AT, moist mucus membranes.  Trachea midline, no masses. Cardiovascular: No clubbing, cyanosis, or edema. Respiratory: Normal respiratory effort, no increased work of breathing. GI: Abdomen is soft, nontender, nondistended, no abdominal masses GU: No CVA tenderness.  Lymph: No cervical or inguinal lymphadenopathy. Skin: No rashes, bruises or suspicious lesions. Neurologic: Grossly intact, no focal deficits, moving all 4  extremities. Psychiatric: Normal mood and affect.  Laboratory Data: Lab Results  Component Value Date   WBC 16.7 (H) 12/25/2020   HGB 13.3 12/25/2020   HCT 35.4 (L) 12/25/2020   MCV 96.5 12/25/2020   PLT 274 12/25/2020    Lab Results  Component Value Date   CREATININE 1.00 03/02/2023    No results found for: "PSA"  No results found for: "TESTOSTERONE"  Lab Results  Component Value Date   HGBA1C 6.6 (H) 03/02/2023    Urinalysis    Component Value Date/Time   COLORURINE AMBER (A) 09/14/2018 1251   APPEARANCEUR Cloudy (A) 11/01/2022 0842   LABSPEC 1.027 09/14/2018 1251   PHURINE 6.0 09/14/2018 1251   GLUCOSEU Negative 11/01/2022 0842   HGBUR NEGATIVE 09/14/2018 1251   BILIRUBINUR negative 04/03/2023 1916   BILIRUBINUR Negative 11/01/2022 0842   KETONESUR negative 04/03/2023 1916   KETONESUR 20 (A) 09/14/2018 1251   PROTEINUR trace (A) 04/03/2023 1916   PROTEINUR Trace 11/01/2022 0842   PROTEINUR 30 (A) 09/14/2018 1251   UROBILINOGEN 0.2 04/03/2023 1916   UROBILINOGEN 0.2 04/01/2014 2010   NITRITE Negative 04/03/2023 1916   NITRITE Negative 11/01/2022 0842   NITRITE NEGATIVE 09/14/2018 1251   LEUKOCYTESUR Large (3+) (A) 04/03/2023 1916    LEUKOCYTESUR 2+ (A) 11/01/2022 0842    Lab Results  Component Value Date   LABMICR See below: 11/01/2022   WBCUA >30 (A) 11/01/2022   LABEPIT 0-10 11/01/2022   BACTERIA Moderate (A) 11/01/2022    Pertinent Imaging:  Results for orders placed during the hospital encounter of 07/16/09  DG Abd 1 View  Narrative Clinical Data: Left ureteral calculus.  ABDOMEN - 1 VIEW 07/16/2009:  Comparison: One-view abdomen x-ray 07/14/2009 and 06/30/2009.  CT abdomen and pelvis 05/19/2009.  Findings: Stable appearance to the 2 adjacent calculi in the distal left ureter.  These are adjacent to and phleboliths in the lower left pelvis as noted on the prior CT.  Phleboliths are also present on the right side of the pelvis.  No visible upper urinary tract calculi on either side.  Bowel gas pattern unremarkable. Degenerative changes again noted throughout the lumbar spine.  IMPRESSION: Stable appearance of the 2 adjacent distal left ureteral calculi.  Provider: Samule Ohm  No results found for this or any previous visit.  No results found for this or any previous visit.  No results found for this or any previous visit.  No results found for this or any previous visit.  No valid procedures specified. No results found for this or any previous visit.  Results for orders placed during the hospital encounter of 06/01/17  CT Renal Stone Study  Narrative CLINICAL DATA:  Left flank pain  EXAM: CT ABDOMEN AND PELVIS WITHOUT CONTRAST  TECHNIQUE: Multidetector CT imaging of the abdomen and pelvis was performed following the standard protocol without IV contrast.  COMPARISON:  06/10/2014  FINDINGS: Lower chest: Lung bases are essentially clear.  Hepatobiliary: Mild hepatic steatosis.  Status post cholecystectomy. No intrahepatic or extrahepatic duct dilatation.  Pancreas: Within normal limits.  Spleen: Within normal limits. 15 mm splenule in the left  upper abdomen.  Adrenals/Urinary Tract: Adrenal glands are within normal limits.  Kidneys are within normal limits. 2 mm nonobstructing right lower pole renal calculus (series 2/ image 48). 3 mm nonobstructing left lower pole renal calculus (series 2/image 52).  No ureteral or bladder calculi.  No hydronephrosis.  Bladder is within normal limits.  Stomach/Bowel: Stomach is within normal limits.  No evidence  of bowel obstruction.  Normal appendix (series 2/image 63).  Left colonic diverticulosis, without evidence of diverticulitis.  Vascular/Lymphatic: No evidence of abdominal aortic aneurysm.  No suspicious abdominopelvic lymphadenopathy.  Reproductive: Prostate is unremarkable.  Other: No abdominopelvic ascites.  Small fat containing left inguinal hernia (series 2/image 93).  Musculoskeletal: Degenerative changes of the visualized thoracolumbar spine.  IMPRESSION: 2 mm nonobstructing right lower pole renal calculus. 3 mm nonobstructing left lower pole renal calculus. No ureteral or bladder calculi. No hydronephrosis.  Sigmoid diverticulosis, without evidence of diverticulitis.  Mild hepatic steatosis.   Electronically Signed By: Charline Bills M.D. On: 06/01/2017 15:33   Assessment & Plan:    1. Benign prostatic hyperplasia with urinary obstruction -continue flomax 0.4mg  BID - Urinalysis, Routine w reflex microscopic  2. Erectile dysfunction due to arterial insufficiency -tadalafil 20mg  prn  3. 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 -continue vantin for 2 weeks   4. Nocturia -flomax 0.4mg  BID   No follow-ups on file.  Wilkie Aye, MD  [facility] [place]

## 2023-04-04 NOTE — Telephone Encounter (Signed)
Pt spouse called and reported pt unable to pick up prescription last night and is requesting prescription be moved to [place] this morning. Prescription electronically sent to [facility].

## 2023-04-05 ENCOUNTER — Encounter: Payer: Self-pay | Admitting: Urology

## 2023-04-05 LAB — URINE CULTURE: Culture: >=100000 — AB

## 2023-04-05 NOTE — Patient Instructions (Signed)

## 2023-04-12 DIAGNOSIS — B078 Other viral warts: Secondary | ICD-10-CM | POA: Diagnosis not present

## 2023-04-12 DIAGNOSIS — L821 Other seborrheic keratosis: Secondary | ICD-10-CM | POA: Diagnosis not present

## 2023-04-12 DIAGNOSIS — D1801 Hemangioma of skin and subcutaneous tissue: Secondary | ICD-10-CM | POA: Diagnosis not present

## 2023-04-12 DIAGNOSIS — L814 Other melanin hyperpigmentation: Secondary | ICD-10-CM | POA: Diagnosis not present

## 2023-04-12 DIAGNOSIS — L918 Other hypertrophic disorders of the skin: Secondary | ICD-10-CM | POA: Diagnosis not present

## 2023-04-12 DIAGNOSIS — L570 Actinic keratosis: Secondary | ICD-10-CM | POA: Diagnosis not present

## 2023-04-19 DIAGNOSIS — I10 Essential (primary) hypertension: Secondary | ICD-10-CM | POA: Diagnosis not present

## 2023-04-19 DIAGNOSIS — I4891 Unspecified atrial fibrillation: Secondary | ICD-10-CM | POA: Diagnosis not present

## 2023-04-19 DIAGNOSIS — E7800 Pure hypercholesterolemia, unspecified: Secondary | ICD-10-CM | POA: Diagnosis not present

## 2023-04-19 DIAGNOSIS — E1165 Type 2 diabetes mellitus with hyperglycemia: Secondary | ICD-10-CM | POA: Diagnosis not present

## 2023-04-19 DIAGNOSIS — E069 Thyroiditis, unspecified: Secondary | ICD-10-CM | POA: Diagnosis not present

## 2023-04-19 DIAGNOSIS — E039 Hypothyroidism, unspecified: Secondary | ICD-10-CM | POA: Diagnosis not present

## 2023-04-25 DIAGNOSIS — M9902 Segmental and somatic dysfunction of thoracic region: Secondary | ICD-10-CM | POA: Diagnosis not present

## 2023-04-25 DIAGNOSIS — M9905 Segmental and somatic dysfunction of pelvic region: Secondary | ICD-10-CM | POA: Diagnosis not present

## 2023-04-25 DIAGNOSIS — M9903 Segmental and somatic dysfunction of lumbar region: Secondary | ICD-10-CM | POA: Diagnosis not present

## 2023-04-25 DIAGNOSIS — M62830 Muscle spasm of back: Secondary | ICD-10-CM | POA: Diagnosis not present

## 2023-06-12 DIAGNOSIS — M1712 Unilateral primary osteoarthritis, left knee: Secondary | ICD-10-CM | POA: Diagnosis not present

## 2023-06-19 ENCOUNTER — Other Ambulatory Visit: Payer: Self-pay | Admitting: Cardiology

## 2023-08-27 ENCOUNTER — Other Ambulatory Visit: Payer: Self-pay | Admitting: Gastroenterology

## 2023-08-27 DIAGNOSIS — N401 Enlarged prostate with lower urinary tract symptoms: Secondary | ICD-10-CM

## 2023-09-03 DIAGNOSIS — M9902 Segmental and somatic dysfunction of thoracic region: Secondary | ICD-10-CM | POA: Diagnosis not present

## 2023-09-03 DIAGNOSIS — M9905 Segmental and somatic dysfunction of pelvic region: Secondary | ICD-10-CM | POA: Diagnosis not present

## 2023-09-03 DIAGNOSIS — M25552 Pain in left hip: Secondary | ICD-10-CM | POA: Diagnosis not present

## 2023-09-03 DIAGNOSIS — M9903 Segmental and somatic dysfunction of lumbar region: Secondary | ICD-10-CM | POA: Diagnosis not present

## 2023-09-05 DIAGNOSIS — M25552 Pain in left hip: Secondary | ICD-10-CM | POA: Diagnosis not present

## 2023-09-05 DIAGNOSIS — M9905 Segmental and somatic dysfunction of pelvic region: Secondary | ICD-10-CM | POA: Diagnosis not present

## 2023-09-05 DIAGNOSIS — M9903 Segmental and somatic dysfunction of lumbar region: Secondary | ICD-10-CM | POA: Diagnosis not present

## 2023-09-05 DIAGNOSIS — M9902 Segmental and somatic dysfunction of thoracic region: Secondary | ICD-10-CM | POA: Diagnosis not present

## 2023-09-12 DIAGNOSIS — B078 Other viral warts: Secondary | ICD-10-CM | POA: Diagnosis not present

## 2023-09-12 DIAGNOSIS — C4441 Basal cell carcinoma of skin of scalp and neck: Secondary | ICD-10-CM | POA: Diagnosis not present

## 2023-09-12 DIAGNOSIS — L570 Actinic keratosis: Secondary | ICD-10-CM | POA: Diagnosis not present

## 2023-09-12 DIAGNOSIS — L820 Inflamed seborrheic keratosis: Secondary | ICD-10-CM | POA: Diagnosis not present

## 2023-09-12 DIAGNOSIS — L821 Other seborrheic keratosis: Secondary | ICD-10-CM | POA: Diagnosis not present

## 2023-09-18 ENCOUNTER — Other Ambulatory Visit: Payer: Self-pay | Admitting: Cardiology

## 2023-09-19 DIAGNOSIS — C4441 Basal cell carcinoma of skin of scalp and neck: Secondary | ICD-10-CM | POA: Diagnosis not present

## 2023-10-10 DIAGNOSIS — G7000 Myasthenia gravis without (acute) exacerbation: Secondary | ICD-10-CM | POA: Diagnosis not present

## 2023-10-11 ENCOUNTER — Encounter: Payer: Self-pay | Admitting: Internal Medicine

## 2023-10-11 DIAGNOSIS — Z23 Encounter for immunization: Secondary | ICD-10-CM | POA: Diagnosis not present

## 2023-10-22 DIAGNOSIS — E039 Hypothyroidism, unspecified: Secondary | ICD-10-CM | POA: Diagnosis not present

## 2023-10-22 DIAGNOSIS — E7800 Pure hypercholesterolemia, unspecified: Secondary | ICD-10-CM | POA: Diagnosis not present

## 2023-10-22 DIAGNOSIS — E1165 Type 2 diabetes mellitus with hyperglycemia: Secondary | ICD-10-CM | POA: Diagnosis not present

## 2023-10-23 ENCOUNTER — Encounter: Payer: Self-pay | Admitting: Gastroenterology

## 2023-10-23 ENCOUNTER — Ambulatory Visit: Payer: Medicare Other | Admitting: Gastroenterology

## 2023-10-23 VITALS — BP 148/80 | HR 70 | Temp 97.6°F | Ht 73.0 in | Wt 224.6 lb

## 2023-10-23 DIAGNOSIS — D6869 Other thrombophilia: Secondary | ICD-10-CM | POA: Diagnosis not present

## 2023-10-23 DIAGNOSIS — Z0001 Encounter for general adult medical examination with abnormal findings: Secondary | ICD-10-CM | POA: Diagnosis not present

## 2023-10-23 DIAGNOSIS — Z8739 Personal history of other diseases of the musculoskeletal system and connective tissue: Secondary | ICD-10-CM

## 2023-10-23 DIAGNOSIS — K219 Gastro-esophageal reflux disease without esophagitis: Secondary | ICD-10-CM

## 2023-10-23 DIAGNOSIS — K222 Esophageal obstruction: Secondary | ICD-10-CM

## 2023-10-23 DIAGNOSIS — G7000 Myasthenia gravis without (acute) exacerbation: Secondary | ICD-10-CM | POA: Diagnosis not present

## 2023-10-23 DIAGNOSIS — E1165 Type 2 diabetes mellitus with hyperglycemia: Secondary | ICD-10-CM | POA: Diagnosis not present

## 2023-10-23 NOTE — Progress Notes (Unsigned)
GI Office Note    Referring Provider: Assunta Found, MD Primary Care Physician:  Assunta Found, MD  Primary Gastroenterologist: Hennie Duos. Marletta Lor, DO   Chief Complaint   Chief Complaint  Patient presents with   Follow-up    History of Present Illness   [name] is a 72 y.o. male presenting today for follow up. History of myasthenia gravis diagnosed in 2002.Patient has history of esophageal strictures/food impaction requiring multiple EGDs with esophageal dilation. Last EGD in 2019. Thought to have silent reflux given recurrent strictures and no significant heartburn.  Patient last seen in January 2023.  Presents today for 2-year follow-up.  Patient doing well. Really having no issues with swallowing. No abdominal pain. BMs regular. No melena, brbpr. Weight is stable. Never had significant heartburn, still with no symptoms. Compliant with PPI.    EGD March 2019 -Benign-appearing esophageal stricture. Dilated. -Non-bleeding Cameron's gastric ulcer with no stigmata of bleeding. Biopsied. -A few gastric polyps. Resected and retrieved. -Small hiatal hernia. -Mild Gastritis. -Path showed chronic active gastritis, negative for H.pylori, fundic gland polyp.    Colonoscopy August 2015 -Moderate diverticulosis in the descending colon and sigmoid colon -The colon mucosa was otherwise normal -Moderate sized internal hemorrhoids -The LEFT colon IS redundant -next colonoscopy in 2025.    Medications   Current Outpatient Medications  Medication Sig Dispense Refill   acyclovir (ZOVIRAX) 400 MG tablet Take 800 mg by mouth daily.     apixaban (ELIQUIS) 5 MG TABS tablet TAKE (1) TABLET BY MOUTH TWICE DAILY. 60 tablet 5   Ascorbic Acid (VITAMIN C PO) Take 4,000 mg by mouth daily.     azaTHIOprine (IMURAN) 50 MG tablet Take 150 mg by mouth daily.      Calcium Carbonate-Vit D-Min (CALCIUM 600+D3 PLUS MINERALS) 600-800 MG-UNIT TABS Take 2 tablets by mouth daily.     cetirizine  (ZYRTEC) 10 MG tablet Take 10 mg by mouth daily.     fluorouracil (EFUDEX) 5 % cream Apply topically 2 (two) times daily.     glimepiride (AMARYL) 1 MG tablet Take 2 mg by mouth 2 (two) times daily.     ibuprofen (ADVIL) 200 MG tablet Take 400 mg by mouth daily as needed for moderate pain.     losartan (COZAAR) 50 MG tablet TAKE (1) TABLET BY MOUTH ONCE DAILY. 90 tablet 0   metFORMIN (GLUCOPHAGE-XR) 500 MG 24 hr tablet Take 1,000 mg by mouth 2 (two) times daily.     Omega-3 Fatty Acids (FISH OIL ULTRA) 1400 MG CAPS Take 1,400 mg by mouth daily after lunch.     omeprazole (PRILOSEC) 20 MG capsule TAKE (1) CAPSULE BY MOUTH TWICE DAILY. TAKE 1 CAPSULE 30 MINUTES BEFORE BREAKFAST, MAY TAKE ADDITIONAL ONE BEFORE EVENING MEAL IF NEEDED. 180 capsule 3   ONETOUCH VERIO test strip 1 each 3 (three) times daily.     Saw Palmetto 450 MG CAPS Take 2,250 mg by mouth daily after lunch.     SYNTHROID 137 MCG tablet Take 137 mcg by mouth daily before breakfast.     tadalafil (CIALIS) 20 MG tablet Take 1 tablet (20 mg total) by mouth as needed. 30 tablet 5   tamsulosin (FLOMAX) 0.4 MG CAPS capsule Take 1 capsule (0.4 mg total) by mouth 2 (two) times daily. 180 capsule 3   vitamin E 180 MG (400 UNITS) capsule Take 800 Units by mouth daily after lunch.     fluticasone (FLONASE) 50 MCG/ACT nasal spray Place 2 sprays into  both nostrils daily. (Patient not taking: Reported on 02/27/2023) 18.2 mL 2   No current facility-administered medications for this visit.    Allergies   Allergies as of 10/23/2023 - Review Complete 10/23/2023  Allergen Reaction Noted   Levofloxacin Anaphylaxis 03/01/2011   Tequin [gatifloxacin] Anaphylaxis 06/07/2012   Iodinated contrast media  02/07/2018   Levothyroxine sodium Other (See Comments) 04/04/2021   Limonene Other (See Comments) and Rash 02/10/2015   Magnesium-containing compounds Other (See Comments) and Rash 04/01/2014   Aminoglycosides Other (See Comments) 06/07/2012   Avelox  [moxifloxacin hcl in nacl] Other (See Comments) 06/07/2012   Botox [onabotulinumtoxina] Other (See Comments) 06/07/2012   Botulinum toxins Other (See Comments) 06/07/2012   Calcium channel blockers Other (See Comments) 06/07/2012   Ciprofloxacin Other (See Comments) 06/07/2012   Curare [tubocurarine] Other (See Comments) 06/07/2012   Dysport [abobotulinumtoxina] Other (See Comments) 06/07/2012   Erythromycin Other (See Comments) 06/07/2012   Factive [gemifloxacin] Other (See Comments) 06/07/2012   Floxin [ofloxacin] Other (See Comments) 06/07/2012   Gentamycin [gentamicin] Other (See Comments) 06/07/2012   Interferons Other (See Comments) 06/07/2012   Kanamycin Other (See Comments) 06/07/2012   Ketek [telithromycin] Other (See Comments) 06/07/2012   Levaquin [levofloxacin in d5w] Other (See Comments) 06/07/2012   Levofloxacin Other (See Comments) 02/06/2022   Macrolides and ketolides Other (See Comments) 06/07/2012   Myobloc [rimabotulinumtoxinb] Other (See Comments) 06/07/2012   Neomycin Other (See Comments) 06/07/2012   Noroxin [norfloxacin] Other (See Comments) 06/07/2012   Other Other (See Comments) 06/07/2012   Penicillamine Other (See Comments) 06/07/2012   Procainamide Other (See Comments) 06/07/2012   Propranolol Other (See Comments) 06/07/2012   Quinidine Other (See Comments) 06/07/2012   Quinine derivatives Other (See Comments) 06/07/2012   Streptomycin Other (See Comments) 06/07/2012   Timolol Other (See Comments) 06/07/2012   Tobramycin Other (See Comments) 06/07/2012   Xeomin [incobotulinumtoxina] Other (See Comments) 06/07/2012   Zithromax [azithromycin] Other (See Comments) 06/07/2012       Review of Systems   General: Negative for anorexia, weight loss, fever, chills, fatigue, weakness. ENT: Negative for hoarseness, difficulty swallowing , nasal congestion. CV: Negative for chest pain, angina, palpitations, dyspnea on exertion, peripheral edema.  Respiratory:  Negative for dyspnea at rest, dyspnea on exertion, cough, sputum, wheezing.  GI: See history of present illness. GU:  Negative for dysuria, hematuria, urinary incontinence, urinary frequency, nocturnal urination.  Endo: Negative for unusual weight change.     Physical Exam   BP (!) 148/80 (BP Location: Right Arm, Patient Position: Sitting, Cuff Size: Large)   Pulse 70   Temp 97.6 F (36.4 C) (Oral)   Ht 6\' 1"  (1.854 m)   Wt 224 lb 9.6 oz (101.9 kg)   SpO2 96%   BMI 29.63 kg/m    General: Well-nourished, well-developed in no acute distress.  Eyes: No icterus. Mouth: Oropharyngeal mucosa moist and pink   Lungs: Clear to auscultation bilaterally.  Heart: Regular rate and rhythm, no murmurs rubs or gallops.  Abdomen: Bowel sounds are normal, nontender, nondistended, no hepatosplenomegaly or masses,  no abdominal bruits or hernia , no rebound or guarding.  Rectal: not performed  Extremities: No lower extremity edema. No clubbing or deformities. Neuro: Alert and oriented x 4   Skin: Warm and dry, no jaundice.   Psych: Alert and cooperative, normal mood and affect.  Labs   Labs from November 2024: Hemoglobin 15.1, platelets 212,000, white blood cell count 6700, sodium 138, creatinine 1.2, AST 33, ALT 34, total bilirubin 1,  alk phos 58, albumin 4.2. Imaging Studies   No results found.  Assessment/Plan:   GERD/recurrent esophageal strictures: -Silent reflux or with no history of heartburn -Continues to do well on PPI, for the most part takes once daily -No swallowing issues  Colon cancer screening: -Due colonoscopy in 2025 -He will call to schedule when he is ready, given his history of myasthenia gravis he has multiple medications listed as things to avoid because of myasthenia gravis.  We will take this into consideration when determining which bowel prep he will get because of magnesium salts being listed  Leanna Battles. Melvyn Neth, MHS, PA-C [facility]

## 2023-10-23 NOTE — Patient Instructions (Signed)
Continue omeprazole 20mg  daily for silent reflux and recurrent esophageal strictures.   Call when you are ready to schedule your next colonoscopy, you are due anytime between 11/2023 and 06/2024.   Return office visit in two years or sooner if needed.

## 2023-10-29 DIAGNOSIS — E119 Type 2 diabetes mellitus without complications: Secondary | ICD-10-CM | POA: Diagnosis not present

## 2023-10-29 DIAGNOSIS — E039 Hypothyroidism, unspecified: Secondary | ICD-10-CM | POA: Diagnosis not present

## 2023-10-29 DIAGNOSIS — E1165 Type 2 diabetes mellitus with hyperglycemia: Secondary | ICD-10-CM | POA: Diagnosis not present

## 2023-10-29 DIAGNOSIS — I10 Essential (primary) hypertension: Secondary | ICD-10-CM | POA: Diagnosis not present

## 2023-10-29 DIAGNOSIS — E069 Thyroiditis, unspecified: Secondary | ICD-10-CM | POA: Diagnosis not present

## 2023-10-29 DIAGNOSIS — G7000 Myasthenia gravis without (acute) exacerbation: Secondary | ICD-10-CM | POA: Diagnosis not present

## 2023-10-29 DIAGNOSIS — E7800 Pure hypercholesterolemia, unspecified: Secondary | ICD-10-CM | POA: Diagnosis not present

## 2023-10-29 DIAGNOSIS — I4891 Unspecified atrial fibrillation: Secondary | ICD-10-CM | POA: Diagnosis not present

## 2023-11-29 DIAGNOSIS — R6889 Other general symptoms and signs: Secondary | ICD-10-CM | POA: Diagnosis not present

## 2023-11-29 DIAGNOSIS — J329 Chronic sinusitis, unspecified: Secondary | ICD-10-CM | POA: Diagnosis not present

## 2023-12-25 ENCOUNTER — Other Ambulatory Visit: Payer: Self-pay | Admitting: Cardiology

## 2024-01-04 ENCOUNTER — Other Ambulatory Visit: Payer: Self-pay | Admitting: Cardiology

## 2024-01-21 ENCOUNTER — Encounter (INDEPENDENT_AMBULATORY_CARE_PROVIDER_SITE_OTHER): Payer: Self-pay

## 2024-01-21 ENCOUNTER — Ambulatory Visit (INDEPENDENT_AMBULATORY_CARE_PROVIDER_SITE_OTHER)

## 2024-01-21 VITALS — BP 157/73 | HR 70 | Ht 73.0 in | Wt 212.0 lb

## 2024-01-21 DIAGNOSIS — H90A22 Sensorineural hearing loss, unilateral, left ear, with restricted hearing on the contralateral side: Secondary | ICD-10-CM

## 2024-01-21 DIAGNOSIS — H66001 Acute suppurative otitis media without spontaneous rupture of ear drum, right ear: Secondary | ICD-10-CM

## 2024-01-21 DIAGNOSIS — H66011 Acute suppurative otitis media with spontaneous rupture of ear drum, right ear: Secondary | ICD-10-CM

## 2024-01-21 DIAGNOSIS — H903 Sensorineural hearing loss, bilateral: Secondary | ICD-10-CM

## 2024-01-21 MED ORDER — CIPROFLOXACIN-DEXAMETHASONE 0.3-0.1 % OT SUSP: 4.0000 [drp] | Freq: Two times a day (BID) | OTIC | 5 refills | Status: AC

## 2024-01-23 DIAGNOSIS — H66011 Acute suppurative otitis media with spontaneous rupture of ear drum, right ear: Secondary | ICD-10-CM | POA: Insufficient documentation

## 2024-01-23 DIAGNOSIS — H903 Sensorineural hearing loss, bilateral: Secondary | ICD-10-CM | POA: Insufficient documentation

## 2024-01-23 NOTE — Progress Notes (Unsigned)
  Electrophysiology Office Note:   Date:  01/24/2024  ID:  [name], DOB [birth year], MRN [id number]  Primary Cardiologist: Prentice Docker, MD (Inactive) Electrophysiologist: Lanier Prude, MD      History of Present Illness:   [name] is a 73 y.o. male with h/o persistent AF s/p ablation, NICM, and HTN seen today for routine electrophysiology followup.   Since last being seen in our clinic the patient reports doing very well. Overall,  he denies chest pain, palpitations, dyspnea, PND, orthopnea, nausea, vomiting, dizziness, syncope, edema, weight gain, or early satiety.   Review of systems complete and found to be negative unless listed in HPI.   EP Information / Studies Reviewed:    EKG is ordered today. Personal review as below.  EKG Interpretation Date/Time:  Thursday January 24 2024 08:38:58 EST Ventricular Rate:  78 PR Interval:  162 QRS Duration:  94 QT Interval:  378 QTC Calculation: 430 R Axis:   47  Text Interpretation: Normal sinus rhythm Normal ECG When compared with ECG of 21-Dec-2020 09:44, PREVIOUS ECG IS PRESENT Confirmed by Maxine Glenn [phone]) on 01/24/2024 8:46:07 AM    Arrhythmia/Device History S/p PVI and posterior wall ablation 07/2020   Echo 03/2021 LVEF 55-60%  Physical Exam:   VS:  BP (!) 140/72   Pulse 78   Ht 6\' 1"  (1.854 m)   Wt 168 lb 3.2 oz (76.3 kg)   SpO2 95%   BMI 22.19 kg/m    Wt Readings from Last 3 Encounters:  01/24/24 168 lb 3.2 oz (76.3 kg)  01/21/24 212 lb (96.2 kg)  10/23/23 224 lb 9.6 oz (101.9 kg)     GEN: No acute distress NECK: No JVD; No carotid bruits CARDIAC: Regular rate and rhythm, no murmurs, rubs, gallops RESPIRATORY:  Clear to auscultation without rales, wheezing or rhonchi  ABDOMEN: Soft, non-tender, non-distended EXTREMITIES:  No edema; No deformity   ASSESSMENT AND PLAN:    Persistent AF EKG today shows NSR with stable intervals S/p ablation 07/2020 Continue eliquis 5 mg BID for  CHA2DS2/VASc of at least 3 Labs stable for dosing 09/2023 (also would not change by weight or age)  NICM Continue GDMT NYHA II symptoms  HTN Stable on current regimen    Follow up with Dr. Lalla Brothers in 12 months  Signed, Graciella Freer, PA-C

## 2024-01-23 NOTE — Progress Notes (Signed)
 Patient ID: [name], male   DOB: 05/29/1951, 73 y.o.   MRN: [id number]  CC: Right ear drainage, right ear pain  HPI:  [name] is a 73 y.o. male who presents today for evaluation of his right ear drainage and right ear pain.  According to the patient, he first noted the right ear drainage 1 week ago.  He has resulted in persistent discomfort in the right ear.  He denies any left ear difficulty.  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  He is not on any otologic medication at this time.  Past Medical History:  Diagnosis Date   Candida esophagitis (HCC) 08/2013   Deafness in right ear    Diabetes mellitus without complication (HCC)    Difficult intubation    Esophageal stricture 07/2010   FOOD IMPACTION-->SAV DIL 16 MM   GERD (gastroesophageal reflux disease)    History of kidney stones    Hypertension    Hypothyroidism    Kidney stone    Myasthenia gravis (HCC)    Sleep apnea    No CPAP   Thyroid disease     Past Surgical History:  Procedure Laterality Date   ATRIAL FIBRILLATION ABLATION N/A 08/12/2020   Procedure: ATRIAL FIBRILLATION ABLATION;  Surgeon: Lanier Prude, MD;  Location: [facility];  Service: Cardiovascular;  Laterality: N/A;   BIOPSY  02/14/2018   Procedure: BIOPSY;  Surgeon: West Bali, MD;  Location: [facility];  Service: Endoscopy;;  gastric   CARDIOVERSION N/A 06/08/2020   Procedure: CARDIOVERSION;  Surgeon: Antoine Poche, MD;  Location: [facility];  Service: Endoscopy;  Laterality: N/A;   CARDIOVERSION N/A 09/16/2020   Procedure: CARDIOVERSION;  Surgeon: Meriam Sprague, MD;  Location: [facility] ENDOSCOPY;  Service: Cardiovascular;  Laterality: N/A;   CHOLECYSTECTOMY     COLONOSCOPY  2005   Dr. Lovell Sheehan   COLONOSCOPY N/A 07/17/2014   Procedure: COLONOSCOPY;  Surgeon: West Bali, MD;  Location: [facility];  Service: Endoscopy;  Laterality: N/A;   9:30-rescheduled 8/28 same time Soledad Gerlach to notify pt   ESOPHAGOGASTRODUODENOSCOPY N/A 07/25/2013   Shallow ulcer in the cardia, mid esophageal web, stricture at GE junction   ESOPHAGOGASTRODUODENOSCOPY N/A 02/14/2018   Procedure: ESOPHAGOGASTRODUODENOSCOPY (EGD);  Surgeon: West Bali, MD;  Location: [facility];  Service: Endoscopy;  Laterality: N/A;  11:15am   ESOPHAGOGASTRODUODENOSCOPY (EGD) WITH ESOPHAGEAL DILATION N/A 08/29/2013   Candida esophagitis, small hiatal hernia, moderate nonerosive gastritis, mid esophageal web   HERNIA REPAIR     right inguinal   KIDNEY STONE SURGERY     KNEE ARTHROSCOPY Right 03/12/2023   Procedure: RIGHT KNEE ARTHROSCOPY, MEDIAL AND LATERAL MENISECTOMY;  Surgeon: Gean Birchwood, MD;  Location: [facility];  Service: Orthopedics;  Laterality: Right;   NASAL SEPTUM SURGERY     PALATE SURGERY         SAVORY DILATION N/A 02/14/2018   Procedure: SAVORY DILATION;  Surgeon: West Bali, MD;  Location: [facility];  Service: Endoscopy;  Laterality: N/A;   TEE WITHOUT CARDIOVERSION N/A 08/10/2020   Procedure: TRANSESOPHAGEAL ECHOCARDIOGRAM (TEE);  Surgeon: Chilton Si, MD;  Location: [facility] ENDOSCOPY;  Service: Cardiovascular;  Laterality: N/A;   THYMECTOMY     TONSILLECTOMY     UPPER GASTROINTESTINAL ENDOSCOPY  SEP 2011   SAV DIL    Family History  Problem Relation Age of Onset   Liver cancer Father  age 72, deceased   Colon cancer Neg Hx     Social History:  reports that he has never smoked. He has never used smokeless tobacco. He reports that he does not drink alcohol and does not use drugs.  Allergies:  Allergies  Allergen Reactions   Levofloxacin Anaphylaxis   Tequin [Gatifloxacin] Anaphylaxis   Iodinated Contrast Media     MD told pt cannot have    Levothyroxine Sodium Other (See Comments)    Myasthenia Gravis (MG) Medication Alert   Limonene Other (See Comments) and Rash    REACTION:Myasthenia Gravis (MG) Medication  Alert REACTION:Myasthenia Gravis (MG) Medication Alert   Magnesium-Containing Compounds Other (See Comments) and Rash    Unknown Unknown Unknown   Aminoglycosides Other (See Comments)    REACTION: Myasthenia Gravis (MG) Medication Alert   Avelox [Moxifloxacin Hcl In Nacl] Other (See Comments)    REACTION: FLUOROQUINOLONE ANTIBIOTICS: Myasthenia Gravis (MG) Medication Alert   Botox [Onabotulinumtoxina] Other (See Comments)    REACTION: Myasthenia Gravis (MG) Medication Alert   Botulinum Toxins Other (See Comments)    REACTION: Myasthenia Gravis (MG) Medication Alert   Calcium Channel Blockers Other (See Comments)    (BLOOD PRESSURE MEDICATION) REACTION: Myasthenia Gravis (MG) Medication Alert   Ciprofloxacin Other (See Comments)    REACTION: FLUOROQUINOLONE ANTIBIOTICS: Myasthenia Gravis (MG) Medication Alert   Curare [Tubocurarine] Other (See Comments)    (Usually only used during surgery)  REACTION: Myasthenia Gravis (MG) Medication Alert   Dysport [Abobotulinumtoxina] Other (See Comments)    REACTION: Myasthenia Gravis (MG) Medication Alert   Erythromycin Other (See Comments)    REACTION: Myasthenia Gravis (MG) Medication Alert   Factive [Gemifloxacin] Other (See Comments)    REACTION: FLUOROQUINOLONE ANTIBIOTICS: Myasthenia Gravis (MG) Medication Alert   Floxin [Ofloxacin] Other (See Comments)    REACTION: FLUOROQUINOLONE ANTIBIOTICS: Myasthenia Gravis (MG) Medication Alert   Gentamycin [Gentamicin] Other (See Comments)    REACTION:Myasthenia Gravis (MG) Medication Alert   Interferons Other (See Comments)    REACTION: Myasthenia Gravis (MG) Medication Alert   Kanamycin Other (See Comments)    REACTION: Myasthenia Gravis (MG) Medication Alert   Ketek [Telithromycin] Other (See Comments)    REACTION: Myasthenia Gravis (MG) Medication Alert   Levaquin [Levofloxacin In D5w] Other (See Comments)    REACTION: FLUOROQUINOLONE ANTIBIOTICS: Myasthenia Gravis (MG) Medication Alert    Levofloxacin Other (See Comments)    Myasthenia Gravis (MG) Medication Alert   Macrolides And Ketolides Other (See Comments)    REACTION: Myasthenia Gravis (MG) Medication Alert REACTION: Myasthenia Gravis (MG) Medication Alert   Myobloc [Rimabotulinumtoxinb] Other (See Comments)    REACTION: Myasthenia Gravis (MG) Medication Alert   Neomycin Other (See Comments)    REACTION: Myasthenia Gravis (MG) Medication Alert   Noroxin [Norfloxacin] Other (See Comments)    REACTION: FLUOROQUINOLONE ANTIBIOTICS: Myasthenia Gravis (MG) Medication Alert   Other Other (See Comments)    MAGNESIUM SALTS: Milk of Magnesia, some antacids (Maalox, Mylanta) REACTION: Myasthenia Gravis (MG) Medication Alerta   Penicillamine Other (See Comments)    D-PENICILLAMINE *DO NOT CONFUSE WITH PENICILLIN*  REACTION: Myasthenia Gravis (MG) Medication Alert   Procainamide Other (See Comments)    REACTION: Myasthenia Gravis (MG) Medication Alert   Propranolol Other (See Comments)    REACTION: Myasthenia Gravis (MG) Medication Alert   Quinidine Other (See Comments)    REACTION: Myasthenia Gravis (MG) Medication Alert   Quinine Derivatives Other (See Comments)    REACTION: Myasthenia Gravis (MG) Medication Alert   Streptomycin Other (See Comments)  REACTION:  Myasthenia Gravis (MG) Medication Alert   Timolol Other (See Comments)    REACTION: Myasthenia Gravis (MG) Medication Alert   Tobramycin Other (See Comments)    REACTION: Myasthenia Gravis (MG) Medication Alert   Xeomin [Incobotulinumtoxina] Other (See Comments)    REACTION: Myasthenia Gravis (MG) Medication Alert   Zithromax [Azithromycin] Other (See Comments)    REACTION: Myasthenia Gravis (MG) Medication Alert    Prior to Admission medications   Medication Sig Start Date End Date Taking? Authorizing Provider  acyclovir (ZOVIRAX) 400 MG tablet Take 800 mg by mouth daily.   Yes [provider]  apixaban (ELIQUIS) 5 MG TABS tablet TAKE (1) TABLET  BY MOUTH TWICE DAILY. 06/14/22  Yes Lanier Prude, MD  Ascorbic Acid (VITAMIN C PO) Take 4,000 mg by mouth daily.   Yes [provider]  azaTHIOprine (IMURAN) 50 MG tablet Take 150 mg by mouth daily.    Yes [provider]  Calcium Carbonate-Vit D-Min (CALCIUM 600+D3 PLUS MINERALS) 600-800 MG-UNIT TABS Take 2 tablets by mouth daily.   Yes [provider]  cetirizine (ZYRTEC) 10 MG tablet Take 10 mg by mouth daily.   Yes [provider]  ciprofloxacin-dexamethasone (CIPRODEX) OTIC suspension Place 4 drops into both ears 2 (two) times daily for 10 days. 01/21/24 01/31/24 Yes Newman Pies, MD  fluorouracil (EFUDEX) 5 % cream Apply topically 2 (two) times daily. 09/12/23  Yes [provider]  fluticasone (FLONASE) 50 MCG/ACT nasal spray Place 2 sprays into both nostrils daily. 08/02/22  Yes Cobb, Ruby Cola, NP  glimepiride (AMARYL) 1 MG tablet Take 2 mg by mouth 2 (two) times daily.   Yes [provider]  ibuprofen (ADVIL) 200 MG tablet Take 400 mg by mouth daily as needed for moderate pain.   Yes [provider]  losartan (COZAAR) 50 MG tablet TAKE (1) TABLET BY MOUTH ONCE DAILY. 01/04/24  Yes Lanier Prude, MD  metFORMIN (GLUCOPHAGE-XR) 500 MG 24 hr tablet Take 1,000 mg by mouth 2 (two) times daily.   Yes [provider]  Omega-3 Fatty Acids (FISH OIL ULTRA) 1400 MG CAPS Take 1,400 mg by mouth daily after lunch.   Yes [provider]  omeprazole (PRILOSEC) 20 MG capsule TAKE (1) CAPSULE BY MOUTH TWICE DAILY. TAKE 1 CAPSULE 30 MINUTES BEFORE BREAKFAST, MAY TAKE ADDITIONAL ONE BEFORE EVENING MEAL IF NEEDED. 08/27/23  Yes Rourk, Gerrit Friends, MD  [facility] VERIO test strip 1 each 3 (three) times daily. 08/05/20  Yes [provider]  Saw Palmetto 450 MG CAPS Take 2,250 mg by mouth daily after lunch.   Yes [provider]  SYNTHROID 137 MCG tablet Take 137 mcg by mouth daily before breakfast. 07/03/13  Yes [provider]  tadalafil (CIALIS) 20 MG tablet Take 1 tablet (20 mg total) by mouth as needed. 04/04/23  Yes McKenzie, Mardene Celeste, MD  tamsulosin (FLOMAX) 0.4 MG CAPS capsule Take 1 capsule (0.4 mg total) by mouth 2 (two) times daily. 04/04/23  Yes McKenzie, Mardene Celeste, MD  vitamin E 180 MG (400 UNITS) capsule Take 800 Units by mouth daily after lunch.   Yes [provider]    Blood pressure (!) 157/73, pulse 70, height 6\' 1"  (1.854 m), weight 212 lb (96.2 kg), SpO2 95%. Exam: General: Communicates without difficulty, well nourished, no acute distress. Head: Normocephalic, no evidence injury, no tenderness, facial buttresses intact without stepoff. Face/sinus: No tenderness to palpation and percussion. Facial movement is normal and symmetric. Eyes:  PERRL, EOMI. No scleral icterus, conjunctivae clear. Neuro: CN II exam reveals vision grossly intact.  No nystagmus at any point of gaze. Ears: Auricles well formed without lesions.  Purulent drainage is noted within the right ear canal.  Under the operating microscope, the right ear canal is debrided with a suction catheter.  The right tympanic membrane is significantly inflamed.  The left ear canal and tympanic membrane are normal.  Nose: External evaluation reveals normal support and skin without lesions.  Dorsum is intact.  Anterior rhinoscopy reveals congested mucosa over anterior aspect of inferior turbinates and intact septum.  No purulence noted. Oral:  Oral cavity and oropharynx are intact, symmetric, without erythema or edema.  Mucosa is moist without lesions. Neck: Full range of motion without pain.  There is no significant lymphadenopathy.  No masses palpable.  Thyroid bed within normal limits to palpation.  Parotid glands and submandibular glands equal bilaterally without mass.  Trachea is midline. Neuro:  CN 2-12 grossly intact.   Assessment: 1.  Acute right otitis media with purulent otorrhea. 2.  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  Plan: 1.  Otomicroscopy with debridement of the right ear canal. 2.  Ciprodex eardrops 4 drops right ear twice daily for 10 days. 3.  The patient will return for reevaluation in 2 weeks.  Hayly Litsey W Hubert Derstine 01/23/2024, 10:50 AM

## 2024-01-24 ENCOUNTER — Encounter: Payer: Self-pay | Admitting: Student

## 2024-01-24 ENCOUNTER — Ambulatory Visit: Payer: Medicare Other | Attending: Student | Admitting: Student

## 2024-01-24 VITALS — BP 140/72 | HR 78 | Ht 73.0 in | Wt 168.2 lb

## 2024-01-24 DIAGNOSIS — I10 Essential (primary) hypertension: Secondary | ICD-10-CM | POA: Diagnosis not present

## 2024-01-24 DIAGNOSIS — I4819 Other persistent atrial fibrillation: Secondary | ICD-10-CM

## 2024-01-24 DIAGNOSIS — I428 Other cardiomyopathies: Secondary | ICD-10-CM | POA: Diagnosis not present

## 2024-01-24 NOTE — Patient Instructions (Signed)
Medication Instructions:  Your physician recommends that you continue on your current medications as directed. Please refer to the Current Medication list given to you today.  *If you need a refill on your cardiac medications before your next appointment, please call your pharmacy*  Lab Work: None ordered If you have labs (blood work) drawn today and your tests are completely normal, you will receive your results only by: MyChart Message (if you have MyChart) OR A paper copy in the mail If you have any lab test that is abnormal or we need to change your treatment, we will call you to review the results.  Follow-Up: At [facility], you and your health needs are our priority.  As part of our continuing mission to provide you with exceptional heart care, we have created designated Provider Care Teams.  These Care Teams include your primary Cardiologist (physician) and Advanced Practice Providers (APPs -  Physician Assistants and Nurse Practitioners) who all work together to provide you with the care you need, when you need it.  Your next appointment:   1 year(s)  Provider:   Steffanie Dunn, MD or Doreatha Martin, PA-C

## 2024-01-25 ENCOUNTER — Other Ambulatory Visit: Payer: Self-pay | Admitting: Cardiology

## 2024-02-05 DIAGNOSIS — I482 Chronic atrial fibrillation, unspecified: Secondary | ICD-10-CM | POA: Diagnosis not present

## 2024-02-05 DIAGNOSIS — J0100 Acute maxillary sinusitis, unspecified: Secondary | ICD-10-CM | POA: Diagnosis not present

## 2024-02-05 DIAGNOSIS — E1165 Type 2 diabetes mellitus with hyperglycemia: Secondary | ICD-10-CM | POA: Diagnosis not present

## 2024-02-05 DIAGNOSIS — G7000 Myasthenia gravis without (acute) exacerbation: Secondary | ICD-10-CM | POA: Diagnosis not present

## 2024-02-06 ENCOUNTER — Telehealth (INDEPENDENT_AMBULATORY_CARE_PROVIDER_SITE_OTHER): Payer: Self-pay | Admitting: Otolaryngology

## 2024-02-06 NOTE — Telephone Encounter (Signed)
 Reminder Call:  Date: 02/07/2024 Status: Sch  Time: 1:40 PM Confirmed time and location- [address] [place], [place] [address]

## 2024-02-07 ENCOUNTER — Ambulatory Visit (INDEPENDENT_AMBULATORY_CARE_PROVIDER_SITE_OTHER): Admitting: Otolaryngology

## 2024-02-07 ENCOUNTER — Encounter (INDEPENDENT_AMBULATORY_CARE_PROVIDER_SITE_OTHER): Payer: Self-pay

## 2024-02-07 VITALS — BP 163/73 | HR 76 | Ht 73.0 in | Wt 214.0 lb

## 2024-02-07 DIAGNOSIS — H903 Sensorineural hearing loss, bilateral: Secondary | ICD-10-CM | POA: Diagnosis not present

## 2024-02-07 DIAGNOSIS — H7291 Unspecified perforation of tympanic membrane, right ear: Secondary | ICD-10-CM

## 2024-02-07 DIAGNOSIS — H7201 Central perforation of tympanic membrane, right ear: Secondary | ICD-10-CM

## 2024-02-09 DIAGNOSIS — H7201 Central perforation of tympanic membrane, right ear: Secondary | ICD-10-CM | POA: Insufficient documentation

## 2024-02-09 NOTE — Progress Notes (Signed)
 Patient ID: [name], male   DOB: [birth year], 73 y.o.   MRN: [id number]  Follow-up: Right ear pain, right ear drainage  HPI: The patient is a 73 year old male who returns today for his follow-up evaluation.  He was last seen 2 weeks ago.  At that time, he was noted to have an acute right otitis media with purulent otorrhea.  He was treated with debridement and Ciprodex eardrops.  The patient returns today reporting resolution of his right ear pain and right ear drainage.  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  He denies any recent change in his hearing.  Exam: General: Communicates without difficulty, well nourished, no acute distress. Head: Normocephalic, no evidence injury, no tenderness, facial buttresses intact without stepoff. Face/sinus: No tenderness to palpation and percussion. Facial movement is normal and symmetric. Eyes: PERRL, EOMI. No scleral icterus, conjunctivae clear. Neuro: CN II exam reveals vision grossly intact.  No nystagmus at any point of gaze. Ears: Auricles well formed without lesions.  A right tympanic membrane perforation is noted.  No drainage is noted today.  The left ear canal and tympanic membrane are normal.  Nose: External evaluation reveals normal support and skin without lesions.  Dorsum is intact.  Anterior rhinoscopy reveals congested mucosa over anterior aspect of inferior turbinates and intact septum.  No purulence noted. Oral:  Oral cavity and oropharynx are intact, symmetric, without erythema or edema.  Mucosa is moist without lesions. Neck: Full range of motion without pain.  There is no significant lymphadenopathy.  No masses palpable.  Thyroid bed within normal limits to palpation.  Parotid glands and submandibular glands equal bilaterally without mass.  Trachea is midline. Neuro:  CN 2-12 grossly intact.   Assessment: 1.  The patient's acute right otitis media has resolved. 2.  He is noted to have a  right tympanic membrane perforation. 3.  The left ear canal and tympanic membrane are normal. 4.  History of right ear deafness and left ear high-frequency sensorineural hearing loss.  Plan: 1.  The physical exam findings are reviewed with the patient. 2.  Dry ear precautions on the right side. 3.  The patient will return for reevaluation in 6 months.

## 2024-03-25 DIAGNOSIS — E039 Hypothyroidism, unspecified: Secondary | ICD-10-CM | POA: Diagnosis not present

## 2024-03-25 DIAGNOSIS — E7800 Pure hypercholesterolemia, unspecified: Secondary | ICD-10-CM | POA: Diagnosis not present

## 2024-03-25 DIAGNOSIS — E1165 Type 2 diabetes mellitus with hyperglycemia: Secondary | ICD-10-CM | POA: Diagnosis not present

## 2024-04-01 DIAGNOSIS — G7000 Myasthenia gravis without (acute) exacerbation: Secondary | ICD-10-CM | POA: Diagnosis not present

## 2024-04-01 DIAGNOSIS — E7800 Pure hypercholesterolemia, unspecified: Secondary | ICD-10-CM | POA: Diagnosis not present

## 2024-04-01 DIAGNOSIS — E069 Thyroiditis, unspecified: Secondary | ICD-10-CM | POA: Diagnosis not present

## 2024-04-01 DIAGNOSIS — E1165 Type 2 diabetes mellitus with hyperglycemia: Secondary | ICD-10-CM | POA: Diagnosis not present

## 2024-04-01 DIAGNOSIS — E039 Hypothyroidism, unspecified: Secondary | ICD-10-CM | POA: Diagnosis not present

## 2024-04-01 DIAGNOSIS — I4891 Unspecified atrial fibrillation: Secondary | ICD-10-CM | POA: Diagnosis not present

## 2024-04-01 DIAGNOSIS — I10 Essential (primary) hypertension: Secondary | ICD-10-CM | POA: Diagnosis not present

## 2024-04-02 ENCOUNTER — Encounter: Payer: Self-pay | Admitting: Urology

## 2024-04-02 ENCOUNTER — Ambulatory Visit: Payer: Medicare Other | Admitting: Urology

## 2024-04-02 VITALS — BP 125/71 | HR 85

## 2024-04-02 DIAGNOSIS — N401 Enlarged prostate with lower urinary tract symptoms: Secondary | ICD-10-CM

## 2024-04-02 DIAGNOSIS — N5201 Erectile dysfunction due to arterial insufficiency: Secondary | ICD-10-CM

## 2024-04-02 DIAGNOSIS — R351 Nocturia: Secondary | ICD-10-CM

## 2024-04-02 DIAGNOSIS — N138 Other obstructive and reflux uropathy: Secondary | ICD-10-CM

## 2024-04-02 DIAGNOSIS — N411 Chronic prostatitis: Secondary | ICD-10-CM

## 2024-04-02 LAB — URINALYSIS, ROUTINE W REFLEX MICROSCOPIC
Bilirubin, UA: NEGATIVE
Glucose, UA: NEGATIVE
Ketones, UA: NEGATIVE
Leukocytes,UA: NEGATIVE
Nitrite, UA: NEGATIVE
RBC, UA: NEGATIVE
Specific Gravity, UA: 1.025 (ref 1.005–1.030)
Urobilinogen, Ur: 1 mg/dL (ref 0.2–1.0)
pH, UA: 6 (ref 5.0–7.5)

## 2024-04-02 MED ORDER — TAMSULOSIN HCL 0.4 MG PO CAPS
0.4000 mg | ORAL_CAPSULE | Freq: Two times a day (BID) | ORAL | 3 refills | Status: AC
Start: 2024-04-02 — End: ?

## 2024-04-02 MED ORDER — TADALAFIL 20 MG PO TABS: 20.0000 mg | ORAL_TABLET | ORAL | 5 refills | Status: DC | PRN

## 2024-04-02 NOTE — Patient Instructions (Signed)

## 2024-04-02 NOTE — Progress Notes (Signed)
 04/02/2024 2:29 PM   [name] 1951/07/13 [id number]  Referring provider: Minus Amel, MD [address] [place],  [place] [address]  FOLLOWUP BPH   HPI: Mr Ally is a 72yo here for followup for BPH, chronic prostatitis and erectile dysfunction. IPSS 9 QOl 1 on flomax  BID. Urine stream strong. No straining to urinate. Nocturia 1-2x. He has had 2 flares of prostatitis since last visit. UA today is normal. He uses tadalfil 20mg  with mixed results.    PMH: Past Medical History:  Diagnosis Date   Candida esophagitis (HCC) 08/2013   Deafness in right ear    Diabetes mellitus without complication (HCC)    Difficult intubation    Esophageal stricture 07/2010   FOOD IMPACTION-->SAV DIL 16 MM   GERD (gastroesophageal reflux disease)    History of kidney stones    Hypertension    Hypothyroidism    Kidney stone    Myasthenia gravis (HCC)    Sleep apnea    No CPAP   Thyroid  disease     Surgical History: Past Surgical History:  Procedure Laterality Date   ATRIAL FIBRILLATION ABLATION N/A 08/12/2020   Procedure: ATRIAL FIBRILLATION ABLATION;  Surgeon: Boyce Byes, MD;  Location: [facility];  Service: Cardiovascular;  Laterality: N/A;   BIOPSY  02/14/2018   Procedure: BIOPSY;  Surgeon: Alyce Jubilee, MD;  Location: [facility];  Service: Endoscopy;;  gastric   CARDIOVERSION N/A 06/08/2020   Procedure: CARDIOVERSION;  Surgeon: Laurann Pollock, MD;  Location: [facility];  Service: Endoscopy;  Laterality: N/A;   CARDIOVERSION N/A 09/16/2020   Procedure: CARDIOVERSION;  Surgeon: Sonny Dust, MD;  Location: [facility] ENDOSCOPY;  Service: Cardiovascular;  Laterality: N/A;   CHOLECYSTECTOMY     COLONOSCOPY  2005   Dr. Larrie Po   COLONOSCOPY N/A 07/17/2014   Procedure: COLONOSCOPY;  Surgeon: Alyce Jubilee, MD;  Location: [facility];  Service: Endoscopy;  Laterality: N/A;  9:30-rescheduled 8/28 same time Hugo Maes to notify pt    ESOPHAGOGASTRODUODENOSCOPY N/A 07/25/2013   Shallow ulcer in the cardia, mid esophageal web, stricture at GE junction   ESOPHAGOGASTRODUODENOSCOPY N/A 02/14/2018   Procedure: ESOPHAGOGASTRODUODENOSCOPY (EGD);  Surgeon: Alyce Jubilee, MD;  Location: [facility];  Service: Endoscopy;  Laterality: N/A;  11:15am   ESOPHAGOGASTRODUODENOSCOPY (EGD) WITH ESOPHAGEAL DILATION N/A 08/29/2013   Candida esophagitis, small hiatal hernia, moderate nonerosive gastritis, mid esophageal web   HERNIA REPAIR     right inguinal   KIDNEY STONE SURGERY     KNEE ARTHROSCOPY Right 03/12/2023   Procedure: RIGHT KNEE ARTHROSCOPY, MEDIAL AND LATERAL MENISECTOMY;  Surgeon: Wendolyn Hamburger, MD;  Location: [facility];  Service: Orthopedics;  Laterality: Right;   NASAL SEPTUM SURGERY     PALATE SURGERY         SAVORY DILATION N/A 02/14/2018   Procedure: SAVORY DILATION;  Surgeon: Alyce Jubilee, MD;  Location: [facility];  Service: Endoscopy;  Laterality: N/A;   TEE WITHOUT CARDIOVERSION N/A 08/10/2020   Procedure: TRANSESOPHAGEAL ECHOCARDIOGRAM (TEE);  Surgeon: Maudine Sos, MD;  Location: [facility] ENDOSCOPY;  Service: Cardiovascular;  Laterality: N/A;   THYMECTOMY     TONSILLECTOMY     UPPER GASTROINTESTINAL ENDOSCOPY  SEP 2011   SAV DIL    Home Medications:  Allergies as of 04/02/2024       Reactions   Levofloxacin Anaphylaxis   Tequin [gatifloxacin] Anaphylaxis   Iodinated Contrast Media    MD told pt cannot have  Levothyroxine  Sodium Other (See Comments)   Myasthenia Gravis (MG) Medication Alert   Limonene Other (See Comments), Rash   REACTION:Myasthenia Gravis (MG) Medication Alert REACTION:Myasthenia Gravis (MG) Medication Alert   Magnesium-containing Compounds Other (See Comments), Rash   Unknown Unknown Unknown   Aminoglycosides Other (See Comments)   REACTION: Myasthenia Gravis (MG) Medication Alert   Avelox [moxifloxacin Hcl In Nacl] Other (See Comments)   REACTION: FLUOROQUINOLONE ANTIBIOTICS:  Myasthenia Gravis (MG) Medication Alert   Botox [onabotulinumtoxina] Other (See Comments)   REACTION: Myasthenia Gravis (MG) Medication Alert   Botulinum Toxins Other (See Comments)   REACTION: Myasthenia Gravis (MG) Medication Alert   Calcium  Channel Blockers Other (See Comments)   (BLOOD PRESSURE MEDICATION) REACTION: Myasthenia Gravis (MG) Medication Alert   Ciprofloxacin  Other (See Comments)   REACTION: FLUOROQUINOLONE ANTIBIOTICS: Myasthenia Gravis (MG) Medication Alert   Curare [tubocurarine] Other (See Comments)   (Usually only used during surgery)  REACTION: Myasthenia Gravis (MG) Medication Alert   Dysport [abobotulinumtoxina] Other (See Comments)   REACTION: Myasthenia Gravis (MG) Medication Alert   Erythromycin Other (See Comments)   REACTION: Myasthenia Gravis (MG) Medication Alert   Factive [gemifloxacin] Other (See Comments)   REACTION: FLUOROQUINOLONE ANTIBIOTICS: Myasthenia Gravis (MG) Medication Alert   Floxin [ofloxacin] Other (See Comments)   REACTION: FLUOROQUINOLONE ANTIBIOTICS: Myasthenia Gravis (MG) Medication Alert   Gentamycin [gentamicin] Other (See Comments)   REACTION:Myasthenia Gravis (MG) Medication Alert   Interferons Other (See Comments)   REACTION: Myasthenia Gravis (MG) Medication Alert   Kanamycin Other (See Comments)   REACTION: Myasthenia Gravis (MG) Medication Alert   Ketek [telithromycin] Other (See Comments)   REACTION: Myasthenia Gravis (MG) Medication Alert   Levaquin [levofloxacin In D5w] Other (See Comments)   REACTION: FLUOROQUINOLONE ANTIBIOTICS: Myasthenia Gravis (MG) Medication Alert   Levofloxacin Other (See Comments)   Myasthenia Gravis (MG) Medication Alert   Macrolides And Ketolides Other (See Comments)   REACTION: Myasthenia Gravis (MG) Medication Alert REACTION: Myasthenia Gravis (MG) Medication Alert   Myobloc [rimabotulinumtoxinb] Other (See Comments)   REACTION: Myasthenia Gravis (MG) Medication Alert   Neomycin Other (See  Comments)   REACTION: Myasthenia Gravis (MG) Medication Alert   Noroxin [norfloxacin] Other (See Comments)   REACTION: FLUOROQUINOLONE ANTIBIOTICS: Myasthenia Gravis (MG) Medication Alert   Other Other (See Comments)   MAGNESIUM SALTS: Milk of Magnesia, some antacids (Maalox, Mylanta) REACTION: Myasthenia Gravis (MG) Medication Alerta   Penicillamine Other (See Comments)   D-PENICILLAMINE *DO NOT CONFUSE WITH PENICILLIN*  REACTION: Myasthenia Gravis (MG) Medication Alert   Procainamide Other (See Comments)   REACTION: Myasthenia Gravis (MG) Medication Alert   Propranolol Other (See Comments)   REACTION: Myasthenia Gravis (MG) Medication Alert   Quinidine Other (See Comments)   REACTION: Myasthenia Gravis (MG) Medication Alert   Quinine Derivatives Other (See Comments)   REACTION: Myasthenia Gravis (MG) Medication Alert   Streptomycin Other (See Comments)   REACTION:  Myasthenia Gravis (MG) Medication Alert   Timolol Other (See Comments)   REACTION: Myasthenia Gravis (MG) Medication Alert   Tobramycin Other (See Comments)   REACTION: Myasthenia Gravis (MG) Medication Alert   Xeomin [incobotulinumtoxina] Other (See Comments)   REACTION: Myasthenia Gravis (MG) Medication Alert   Zithromax [azithromycin] Other (See Comments)   REACTION: Myasthenia Gravis (MG) Medication Alert        Medication List        Accurate as of Apr 02, 2024  2:29 PM. If you have any questions, ask your nurse or doctor.  acyclovir  400 MG tablet Commonly known as: ZOVIRAX  Take 800 mg by mouth daily.   azaTHIOprine  50 MG tablet Commonly known as: IMURAN  Take 150 mg by mouth daily.   Calcium  600+D3 Plus Minerals 600-800 MG-UNIT Tabs Take 2 tablets by mouth daily.   cetirizine 10 MG tablet Commonly known as: ZYRTEC Take 10 mg by mouth daily.   Eliquis  5 MG Tabs tablet Generic drug: apixaban  TAKE (1) TABLET BY MOUTH TWICE DAILY.   Fish Oil Ultra 1400 MG Caps Take 1,400 mg by mouth  daily after lunch.   fluorouracil 5 % cream Commonly known as: EFUDEX Apply topically 2 (two) times daily.   fluticasone  50 MCG/ACT nasal spray Commonly known as: FLONASE  Place 2 sprays into both nostrils daily.   glimepiride 1 MG tablet Commonly known as: AMARYL Take 2 mg by mouth 2 (two) times daily.   ibuprofen 200 MG tablet Commonly known as: ADVIL Take 400 mg by mouth daily as needed for moderate pain.   losartan  50 MG tablet Commonly known as: COZAAR  TAKE (1) TABLET BY MOUTH ONCE DAILY.   metFORMIN 500 MG 24 hr tablet Commonly known as: GLUCOPHAGE-XR Take 1,000 mg by mouth 2 (two) times daily.   omeprazole  20 MG capsule Commonly known as: PRILOSEC TAKE (1) CAPSULE BY MOUTH TWICE DAILY. TAKE 1 CAPSULE 30 MINUTES BEFORE BREAKFAST, MAY TAKE ADDITIONAL ONE BEFORE EVENING MEAL IF NEEDED.   OneTouch Verio test strip Generic drug: glucose blood 1 each 3 (three) times daily.   Saw Palmetto 450 MG Caps Take 2,250 mg by mouth daily after lunch.   Synthroid  137 MCG tablet Generic drug: levothyroxine  Take 137 mcg by mouth daily before breakfast.   tadalafil  20 MG tablet Commonly known as: CIALIS  Take 1 tablet (20 mg total) by mouth as needed.   tamsulosin  0.4 MG Caps capsule Commonly known as: FLOMAX  Take 1 capsule (0.4 mg total) by mouth 2 (two) times daily.   VITAMIN C PO Take 4,000 mg by mouth daily.   vitamin E  180 MG (400 UNITS) capsule Take 800 Units by mouth daily after lunch.        Allergies:  Allergies  Allergen Reactions   Levofloxacin Anaphylaxis   Tequin [Gatifloxacin] Anaphylaxis   Iodinated Contrast Media     MD told pt cannot have    Levothyroxine  Sodium Other (See Comments)    Myasthenia Gravis (MG) Medication Alert   Limonene Other (See Comments) and Rash    REACTION:Myasthenia Gravis (MG) Medication Alert REACTION:Myasthenia Gravis (MG) Medication Alert   Magnesium-Containing Compounds Other (See Comments) and Rash     Unknown Unknown Unknown   Aminoglycosides Other (See Comments)    REACTION: Myasthenia Gravis (MG) Medication Alert   Avelox [Moxifloxacin Hcl In Nacl] Other (See Comments)    REACTION: FLUOROQUINOLONE ANTIBIOTICS: Myasthenia Gravis (MG) Medication Alert   Botox [Onabotulinumtoxina] Other (See Comments)    REACTION: Myasthenia Gravis (MG) Medication Alert   Botulinum Toxins Other (See Comments)    REACTION: Myasthenia Gravis (MG) Medication Alert   Calcium  Channel Blockers Other (See Comments)    (BLOOD PRESSURE MEDICATION) REACTION: Myasthenia Gravis (MG) Medication Alert   Ciprofloxacin  Other (See Comments)    REACTION: FLUOROQUINOLONE ANTIBIOTICS: Myasthenia Gravis (MG) Medication Alert   Curare [Tubocurarine] Other (See Comments)    (Usually only used during surgery)  REACTION: Myasthenia Gravis (MG) Medication Alert   Dysport [Abobotulinumtoxina] Other (See Comments)    REACTION: Myasthenia Gravis (MG) Medication Alert   Erythromycin Other (See Comments)  REACTION: Myasthenia Gravis (MG) Medication Alert   Factive [Gemifloxacin] Other (See Comments)    REACTION: FLUOROQUINOLONE ANTIBIOTICS: Myasthenia Gravis (MG) Medication Alert   Floxin [Ofloxacin] Other (See Comments)    REACTION: FLUOROQUINOLONE ANTIBIOTICS: Myasthenia Gravis (MG) Medication Alert   Gentamycin [Gentamicin] Other (See Comments)    REACTION:Myasthenia Gravis (MG) Medication Alert   Interferons Other (See Comments)    REACTION: Myasthenia Gravis (MG) Medication Alert   Kanamycin Other (See Comments)    REACTION: Myasthenia Gravis (MG) Medication Alert   Ketek [Telithromycin] Other (See Comments)    REACTION: Myasthenia Gravis (MG) Medication Alert   Levaquin [Levofloxacin In D5w] Other (See Comments)    REACTION: FLUOROQUINOLONE ANTIBIOTICS: Myasthenia Gravis (MG) Medication Alert   Levofloxacin Other (See Comments)    Myasthenia Gravis (MG) Medication Alert   Macrolides And Ketolides Other (See  Comments)    REACTION: Myasthenia Gravis (MG) Medication Alert REACTION: Myasthenia Gravis (MG) Medication Alert   Myobloc [Rimabotulinumtoxinb] Other (See Comments)    REACTION: Myasthenia Gravis (MG) Medication Alert   Neomycin Other (See Comments)    REACTION: Myasthenia Gravis (MG) Medication Alert   Noroxin [Norfloxacin] Other (See Comments)    REACTION: FLUOROQUINOLONE ANTIBIOTICS: Myasthenia Gravis (MG) Medication Alert   Other Other (See Comments)    MAGNESIUM SALTS: Milk of Magnesia, some antacids (Maalox, Mylanta) REACTION: Myasthenia Gravis (MG) Medication Alerta   Penicillamine Other (See Comments)    D-PENICILLAMINE *DO NOT CONFUSE WITH PENICILLIN*  REACTION: Myasthenia Gravis (MG) Medication Alert   Procainamide Other (See Comments)    REACTION: Myasthenia Gravis (MG) Medication Alert   Propranolol Other (See Comments)    REACTION: Myasthenia Gravis (MG) Medication Alert   Quinidine Other (See Comments)    REACTION: Myasthenia Gravis (MG) Medication Alert   Quinine Derivatives Other (See Comments)    REACTION: Myasthenia Gravis (MG) Medication Alert   Streptomycin Other (See Comments)    REACTION:  Myasthenia Gravis (MG) Medication Alert   Timolol Other (See Comments)    REACTION: Myasthenia Gravis (MG) Medication Alert   Tobramycin Other (See Comments)    REACTION: Myasthenia Gravis (MG) Medication Alert   Xeomin [Incobotulinumtoxina] Other (See Comments)    REACTION: Myasthenia Gravis (MG) Medication Alert   Zithromax [Azithromycin] Other (See Comments)    REACTION: Myasthenia Gravis (MG) Medication Alert    Family History: Family History  Problem Relation Age of Onset   Liver cancer Father        age 57, deceased   Colon cancer Neg Hx     Social History:  reports that he has never smoked. He has never used smokeless tobacco. He reports that he does not drink alcohol and does not use drugs.  ROS: All other review of systems were reviewed and are negative  except what is noted above in HPI  Physical Exam: BP 125/71   Pulse 85   Constitutional:  Alert and oriented, No acute distress. HEENT: [place] AT, moist mucus membranes.  Trachea midline, no masses. Cardiovascular: No clubbing, cyanosis, or edema. Respiratory: Normal respiratory effort, no increased work of breathing. GI: Abdomen is soft, nontender, nondistended, no abdominal masses GU: No CVA tenderness.  Lymph: No cervical or inguinal lymphadenopathy. Skin: No rashes, bruises or suspicious lesions. Neurologic: Grossly intact, no focal deficits, moving all 4 extremities. Psychiatric: Normal mood and affect.  Laboratory Data: Lab Results  Component Value Date   WBC 16.7 (H) 12/25/2020   HGB 13.3 12/25/2020   HCT 35.4 (L) 12/25/2020   MCV 96.5 12/25/2020  PLT 274 12/25/2020    Lab Results  Component Value Date   CREATININE 1.00 03/02/2023    No results found for: "PSA"  No results found for: "TESTOSTERONE"  Lab Results  Component Value Date   HGBA1C 6.6 (H) 03/02/2023    Urinalysis    Component Value Date/Time   COLORURINE AMBER (A) 09/14/2018 1251   APPEARANCEUR Cloudy (A) 11/01/2022 0842   LABSPEC 1.027 09/14/2018 1251   PHURINE 6.0 09/14/2018 1251   GLUCOSEU Negative 11/01/2022 0842   HGBUR NEGATIVE 09/14/2018 1251   BILIRUBINUR negative 04/03/2023 1916   BILIRUBINUR Negative 11/01/2022 0842   KETONESUR negative 04/03/2023 1916   KETONESUR 20 (A) 09/14/2018 1251   PROTEINUR trace (A) 04/03/2023 1916   PROTEINUR Trace 11/01/2022 0842   PROTEINUR 30 (A) 09/14/2018 1251   UROBILINOGEN 0.2 04/03/2023 1916   UROBILINOGEN 0.2 04/01/2014 2010   NITRITE Negative 04/03/2023 1916   NITRITE Negative 11/01/2022 0842   NITRITE NEGATIVE 09/14/2018 1251   LEUKOCYTESUR Large (3+) (A) 04/03/2023 1916   LEUKOCYTESUR 2+ (A) 11/01/2022 0842    Lab Results  Component Value Date   LABMICR See below: 11/01/2022   WBCUA >30 (A) 11/01/2022   LABEPIT 0-10 11/01/2022    BACTERIA Moderate (A) 11/01/2022    Pertinent Imaging:  Results for orders placed during the hospital encounter of 07/16/09  DG Abd 1 View  Narrative Clinical Data: Left ureteral calculus.  ABDOMEN - 1 VIEW 07/16/2009:  Comparison: One-view abdomen x-ray 07/14/2009 and 06/30/2009.  CT abdomen and pelvis 05/19/2009.  Findings: Stable appearance to the 2 adjacent calculi in the distal left ureter.  These are adjacent to and phleboliths in the lower left pelvis as noted on the prior CT.  Phleboliths are also present on the right side of the pelvis.  No visible upper urinary tract calculi on either side.  Bowel gas pattern unremarkable. Degenerative changes again noted throughout the lumbar spine.  IMPRESSION: Stable appearance of the 2 adjacent distal left ureteral calculi.  Provider: Ranee Button  No results found for this or any previous visit.  No results found for this or any previous visit.  No results found for this or any previous visit.  No results found for this or any previous visit.  No results found for this or any previous visit.  No results found for this or any previous visit.  Results for orders placed during the hospital encounter of 06/01/17  CT Renal Stone Study  Narrative CLINICAL DATA:  Left flank pain  EXAM: CT ABDOMEN AND PELVIS WITHOUT CONTRAST  TECHNIQUE: Multidetector CT imaging of the abdomen and pelvis was performed following the standard protocol without IV contrast.  COMPARISON:  06/10/2014  FINDINGS: Lower chest: Lung bases are essentially clear.  Hepatobiliary: Mild hepatic steatosis.  Status post cholecystectomy. No intrahepatic or extrahepatic duct dilatation.  Pancreas: Within normal limits.  Spleen: Within normal limits. 15 mm splenule in the left upper abdomen.  Adrenals/Urinary Tract: Adrenal glands are within normal limits.  Kidneys are within normal limits. 2 mm nonobstructing right lower pole renal calculus  (series 2/ image 48). 3 mm nonobstructing left lower pole renal calculus (series 2/image 52).  No ureteral or bladder calculi.  No hydronephrosis.  Bladder is within normal limits.  Stomach/Bowel: Stomach is within normal limits.  No evidence of bowel obstruction.  Normal appendix (series 2/image 63).  Left colonic diverticulosis, without evidence of diverticulitis.  Vascular/Lymphatic: No evidence of abdominal aortic aneurysm.  No suspicious abdominopelvic lymphadenopathy.  Reproductive: Prostate  is unremarkable.  Other: No abdominopelvic ascites.  Small fat containing left inguinal hernia (series 2/image 93).  Musculoskeletal: Degenerative changes of the visualized thoracolumbar spine.  IMPRESSION: 2 mm nonobstructing right lower pole renal calculus. 3 mm nonobstructing left lower pole renal calculus. No ureteral or bladder calculi. No hydronephrosis.  Sigmoid diverticulosis, without evidence of diverticulitis.  Mild hepatic steatosis.   Electronically Signed By: Zadie Herter M.D. On: 06/01/2017 15:33   Assessment & Plan:    1. 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 Benign prostatic hyperplasia with urinary obstruction -continue flomax  0.4mg  BID  3. Nocturia Continue flomax  0.4mg  BID  4. Erectile dysfunction due to arterial insufficiency Tadalafil  20mg  prn   No follow-ups on file.  Johnie Nailer, MD  [facility] [place]

## 2024-04-08 ENCOUNTER — Ambulatory Visit (INDEPENDENT_AMBULATORY_CARE_PROVIDER_SITE_OTHER): Admitting: Audiology

## 2024-04-08 ENCOUNTER — Encounter (INDEPENDENT_AMBULATORY_CARE_PROVIDER_SITE_OTHER): Payer: Self-pay | Admitting: Otolaryngology

## 2024-04-08 ENCOUNTER — Ambulatory Visit (INDEPENDENT_AMBULATORY_CARE_PROVIDER_SITE_OTHER): Admitting: Otolaryngology

## 2024-04-08 VITALS — BP 172/71 | HR 84

## 2024-04-08 DIAGNOSIS — H7291 Unspecified perforation of tympanic membrane, right ear: Secondary | ICD-10-CM | POA: Diagnosis not present

## 2024-04-08 DIAGNOSIS — H6982 Other specified disorders of Eustachian tube, left ear: Secondary | ICD-10-CM

## 2024-04-08 DIAGNOSIS — H903 Sensorineural hearing loss, bilateral: Secondary | ICD-10-CM

## 2024-04-08 DIAGNOSIS — H7201 Central perforation of tympanic membrane, right ear: Secondary | ICD-10-CM

## 2024-04-09 DIAGNOSIS — H6982 Other specified disorders of Eustachian tube, left ear: Secondary | ICD-10-CM | POA: Insufficient documentation

## 2024-04-09 NOTE — Progress Notes (Signed)
 Patient ID: [name], male   DOB: [birth year], 73 y.o.   MRN: [id number]  New complaint: Sudden left ear hearing loss Follow-up: Right tympanic membrane perforation, right ear deafness  HPI: The patient is a 73 year old male who presents today complaining of sudden left ear hearing loss for the past 2 days.  He has a popping and crackling sensation in the left ear.  The patient was previously seen for his right ear deafness and left ear high-frequency sensorineural hearing loss.  He also has a right tympanic membrane perforation.  Currently he denies any otalgia, otorrhea, facial pain, fever, or visual change.  Exam: General: Communicates without difficulty, well nourished, no acute distress. Head: Normocephalic, no evidence injury, no tenderness, facial buttresses intact without stepoff. Face/sinus: No tenderness to palpation and percussion. Facial movement is normal and symmetric. Eyes: PERRL, EOMI. No scleral icterus, conjunctivae clear. Neuro: CN II exam reveals vision grossly intact.  No nystagmus at any point of gaze. Ears: Auricles well formed without lesions.  A right tympanic membrane perforation is noted.  No drainage is noted today.  The left ear canal and tympanic membrane are normal.  Nose: External evaluation reveals normal support and skin without lesions.  Dorsum is intact.  Anterior rhinoscopy reveals congested mucosa over anterior aspect of inferior turbinates and intact septum.  No purulence noted. Oral:  Oral cavity and oropharynx are intact, symmetric, without erythema or edema.  Mucosa is moist without lesions. Neck: Full range of motion without pain.  There is no significant lymphadenopathy.  No masses palpable.  Thyroid  bed within normal limits to palpation.  Parotid glands and submandibular glands equal bilaterally without mass.  Trachea is midline. Neuro:  CN 2-12 grossly intact.   His hearing test shows stable left ear high-frequency sensorineural hearing loss and right ear  deafness.  Assessment: 1.  Stable left ear high-frequency sensorineural hearing loss.  He also has right ear deafness. 2.  The patient continues to have a right tympanic membrane perforation. 3.  His symptoms may be secondary to left ear eustachian tube dysfunction.  Plan: 1.  The physical exam findings and the hearing test results are reviewed with the patient. 2.  Continue dry ear precautions on the right side. 3.  Flonase  nasal spray 2 sprays each nostril daily.  Valsalva exercises encouraged. 4.  The patient is encouraged to call with any questions or concerns.

## 2024-04-09 NOTE — Progress Notes (Signed)
  [address], [address] [place], [place] [address] [phone]  Audiological Evaluation    Name: [name]     DOB:   07/01/1951      MRN:   [id number]                                                                                     Service Date: 04/09/2024     Accompanied by: unaccompanied   Patient comes today after Dr. Darlin Ehrlich, ENT sent a referral for a hearing evaluation due to concerns with sudden hearing loss.   Symptoms Yes Details  Hearing loss  [x]  Noticed Sunday sounds quality changed- noticed even without his aid. Known profound loss in the right ear for years. Previous audiogram was completed at Dr,. [facility].  Tinnitus  [x]  Both ears  Ear pain/ infections/pressure  []    Balance problems  [x]  Vertigo about 30 years when he had the sudden right hearing change.   Noise exposure history  []    Previous ear surgeries  []    Family history of hearing loss  []    Amplification  []    Other  []      Otoscopy: Right ear: Abnormal eardrum appearance. Left ear:  Clear external ear canals and notable landmarks visualized on the tympanic membrane.  Tympanometry: Right ear: Type B- Normal external ear canal volume with no middle ear pressure peak or tympanic membrane compliance. Left ear: Type Ad- Normal external ear canal volume with normal middle ear pressure and high tympanic membrane compliance.   Pure tone Audiometry: Right ear- Profound sensorineural hearing loss from 125 Hz - 8000 Hz ( no responses obtained) Left ear-  Mild hearing loss from 125-250 Hz ( slight air-bone gap at 250 Hz), then borderline normal hearing from [phone] Hz, then moderate to severe sensorineural hearing loss from 3000 Hz - 8000 Hz. Of note, 125 was not tested previously and 250 Hz shows a decline when compared to the last audiogram completed at [facility].   Speech Audiometry: Right ear- Speech Awareness Threshold (SAT) - No response at maximum equipment levels with  contralateral masking. Left ear-Speech Reception Threshold (SRT) was obtained at 25 dBHL.   Word Recognition Score Tested using NU-6 (MLV) Right ear: Could not test due to degree of hearing loss. Left ear: 96% was obtained at a presentation level of 70 dBHL with contralateral masking which is deemed as  excellent.   The hearing test results were completed under headphones and re-checked with inserts and results are deemed to be of good reliability. Test technique:  conventional       Recommendations: Follow up with ENT as scheduled for today., Return for a hearing evaluation if concerns with hearing changes arise or per MD recommendation. Repeat audiogram after medical care. Consider tinnitus evaluation at [facility]inn[facility].  Lyman Balingit MARIE LEROUX-MARTINEZ, AUD

## 2024-04-15 ENCOUNTER — Encounter: Payer: Self-pay | Admitting: Audiology

## 2024-05-19 DIAGNOSIS — L814 Other melanin hyperpigmentation: Secondary | ICD-10-CM | POA: Diagnosis not present

## 2024-05-19 DIAGNOSIS — D2372 Other benign neoplasm of skin of left lower limb, including hip: Secondary | ICD-10-CM | POA: Diagnosis not present

## 2024-05-19 DIAGNOSIS — L570 Actinic keratosis: Secondary | ICD-10-CM | POA: Diagnosis not present

## 2024-05-19 DIAGNOSIS — L821 Other seborrheic keratosis: Secondary | ICD-10-CM | POA: Diagnosis not present

## 2024-05-19 DIAGNOSIS — Z85828 Personal history of other malignant neoplasm of skin: Secondary | ICD-10-CM | POA: Diagnosis not present

## 2024-05-19 DIAGNOSIS — L820 Inflamed seborrheic keratosis: Secondary | ICD-10-CM | POA: Diagnosis not present

## 2024-05-19 DIAGNOSIS — B078 Other viral warts: Secondary | ICD-10-CM | POA: Diagnosis not present

## 2024-05-19 DIAGNOSIS — D1801 Hemangioma of skin and subcutaneous tissue: Secondary | ICD-10-CM | POA: Diagnosis not present

## 2024-05-19 DIAGNOSIS — D225 Melanocytic nevi of trunk: Secondary | ICD-10-CM | POA: Diagnosis not present

## 2024-05-29 ENCOUNTER — Encounter (INDEPENDENT_AMBULATORY_CARE_PROVIDER_SITE_OTHER): Payer: Self-pay | Admitting: *Deleted

## 2024-06-20 ENCOUNTER — Other Ambulatory Visit: Payer: Self-pay

## 2024-06-20 DIAGNOSIS — N5201 Erectile dysfunction due to arterial insufficiency: Secondary | ICD-10-CM

## 2024-06-20 MED ORDER — TADALAFIL 20 MG PO TABS
20.0000 mg | ORAL_TABLET | ORAL | 5 refills | Status: AC | PRN
Start: 2024-06-20 — End: ?

## 2024-07-06 NOTE — H&P (View-Only) (Signed)
 GI Office Note    Referring Provider: Marvine Rush, MD Primary Care Physician:  Marvine Rush, MD  Primary Gastroenterologist: Carlin POUR. Cindie, DO   Chief Complaint   No chief complaint on file.    History of Present Illness   [name] is a 73 y.o. male presenting today for follow-up.  He was last seen December.  He has a history of myasthenia gravis diagnosed in 2002.  He has had multiple esophageal strictures/food impactions requiring multiple EGDs with esophageal dilation.  Last EGD in 2019.  Thought to have silent reflux.    Prior Data   EGD March 2019 -Benign-appearing esophageal stricture. Dilated. -Non-bleeding Cameron's gastric ulcer with no stigmata of bleeding. Biopsied. -A few gastric polyps. Resected and retrieved. -Small hiatal hernia. -Mild Gastritis. -Path showed chronic active gastritis, negative for H.pylori, fundic gland polyp.    Colonoscopy August 2015 -Moderate diverticulosis in the descending colon and sigmoid colon -The colon mucosa was otherwise normal -Moderate sized internal hemorrhoids -The LEFT colon IS redundant -next colonoscopy in 2025.  Medications   Current Outpatient Medications  Medication Sig Dispense Refill   acyclovir  (ZOVIRAX ) 400 MG tablet Take 800 mg by mouth daily.     apixaban  (ELIQUIS ) 5 MG TABS tablet TAKE (1) TABLET BY MOUTH TWICE DAILY. 60 tablet 5   Ascorbic Acid  (VITAMIN C PO) Take 4,000 mg by mouth daily.     azaTHIOprine  (IMURAN ) 50 MG tablet Take 150 mg by mouth daily.      Calcium  Carbonate-Vit D-Min (CALCIUM  600+D3 PLUS MINERALS) 600-800 MG-UNIT TABS Take 2 tablets by mouth daily.     cetirizine (ZYRTEC) 10 MG tablet Take 10 mg by mouth daily.     fluorouracil (EFUDEX) 5 % cream Apply topically 2 (two) times daily.     fluticasone  (FLONASE ) 50 MCG/ACT nasal spray Place 2 sprays into both nostrils daily. 18.2 mL 2   glimepiride (AMARYL) 1 MG tablet Take 2 mg by mouth 2 (two) times daily.     ibuprofen  (ADVIL) 200 MG tablet Take 400 mg by mouth daily as needed for moderate pain.     losartan  (COZAAR ) 50 MG tablet TAKE (1) TABLET BY MOUTH ONCE DAILY. 90 tablet 3   metFORMIN (GLUCOPHAGE-XR) 500 MG 24 hr tablet Take 1,000 mg by mouth 2 (two) times daily.     Omega-3 Fatty Acids (FISH OIL ULTRA) 1400 MG CAPS Take 1,400 mg by mouth daily after lunch.     omeprazole  (PRILOSEC) 20 MG capsule TAKE (1) CAPSULE BY MOUTH TWICE DAILY. TAKE 1 CAPSULE 30 MINUTES BEFORE BREAKFAST, MAY TAKE ADDITIONAL ONE BEFORE EVENING MEAL IF NEEDED. 180 capsule 3   ONETOUCH VERIO test strip 1 each 3 (three) times daily.     Saw Palmetto 450 MG CAPS Take 2,250 mg by mouth daily after lunch.     SYNTHROID  137 MCG tablet Take 137 mcg by mouth daily before breakfast.     tadalafil  (CIALIS ) 20 MG tablet Take 1 tablet (20 mg total) by mouth as needed. 30 tablet 5   tamsulosin  (FLOMAX ) 0.4 MG CAPS capsule Take 1 capsule (0.4 mg total) by mouth 2 (two) times daily. 180 capsule 3   vitamin E  180 MG (400 UNITS) capsule Take 800 Units by mouth daily after lunch.     No current facility-administered medications for this visit.    Allergies   Allergies as of 07/07/2024 - Review Complete 04/08/2024  Allergen Reaction Noted   Levofloxacin Anaphylaxis 03/01/2011   Tequin [  gatifloxacin] Anaphylaxis 06/07/2012   Iodinated contrast media  02/07/2018   Levothyroxine  sodium Other (See Comments) 04/04/2021   Limonene Other (See Comments) and Rash 02/10/2015   Magnesium-containing compounds Other (See Comments) and Rash 04/01/2014   Aminoglycosides Other (See Comments) 06/07/2012   Avelox [moxifloxacin hcl in nacl] Other (See Comments) 06/07/2012   Botox [onabotulinumtoxina] Other (See Comments) 06/07/2012   Botulinum toxins Other (See Comments) 06/07/2012   Calcium  channel blockers Other (See Comments) 06/07/2012   Ciprofloxacin  Other (See Comments) 06/07/2012   Curare [tubocurarine] Other (See Comments) 06/07/2012   Dysport  [abobotulinumtoxina] Other (See Comments) 06/07/2012   Erythromycin Other (See Comments) 06/07/2012   Factive [gemifloxacin] Other (See Comments) 06/07/2012   Floxin [ofloxacin] Other (See Comments) 06/07/2012   Gentamycin [gentamicin] Other (See Comments) 06/07/2012   Interferons Other (See Comments) 06/07/2012   Kanamycin Other (See Comments) 06/07/2012   Ketek [telithromycin] Other (See Comments) 06/07/2012   Levaquin [levofloxacin in d5w] Other (See Comments) 06/07/2012   Levofloxacin Other (See Comments) 02/06/2022   Macrolides and ketolides Other (See Comments) 06/07/2012   Myobloc [rimabotulinumtoxinb] Other (See Comments) 06/07/2012   Neomycin Other (See Comments) 06/07/2012   Noroxin [norfloxacin] Other (See Comments) 06/07/2012   Other Other (See Comments) 06/07/2012   Penicillamine Other (See Comments) 06/07/2012   Procainamide Other (See Comments) 06/07/2012   Propranolol Other (See Comments) 06/07/2012   Quinidine Other (See Comments) 06/07/2012   Quinine derivatives Other (See Comments) 06/07/2012   Streptomycin Other (See Comments) 06/07/2012   Timolol Other (See Comments) 06/07/2012   Tobramycin Other (See Comments) 06/07/2012   Xeomin [incobotulinumtoxina] Other (See Comments) 06/07/2012   Zithromax [azithromycin] Other (See Comments) 06/07/2012    Past Medical History   Past Medical History:  Diagnosis Date   Candida esophagitis (HCC) 08/2013   Deafness in right ear    Diabetes mellitus without complication (HCC)    Difficult intubation    Esophageal stricture 07/2010   FOOD IMPACTION-->SAV DIL 16 MM   GERD (gastroesophageal reflux disease)    History of kidney stones    Hypertension    Hypothyroidism    Kidney stone    Myasthenia gravis (HCC)    Sleep apnea    No CPAP   Thyroid  disease     Past Surgical History   Past Surgical History:  Procedure Laterality Date   ATRIAL FIBRILLATION ABLATION N/A 08/12/2020   Procedure: ATRIAL FIBRILLATION  ABLATION;  Surgeon: Cindie Ole DASEN, MD;  Location: [facility];  Service: Cardiovascular;  Laterality: N/A;   BIOPSY  02/14/2018   Procedure: BIOPSY;  Surgeon: Harvey Margo CROME, MD;  Location: [facility];  Service: Endoscopy;;  gastric   CARDIOVERSION N/A 06/08/2020   Procedure: CARDIOVERSION;  Surgeon: Alvan Dorn FALCON, MD;  Location: [facility];  Service: Endoscopy;  Laterality: N/A;   CARDIOVERSION N/A 09/16/2020   Procedure: CARDIOVERSION;  Surgeon: Hobart Powell BRAVO, MD;  Location: [facility] ENDOSCOPY;  Service: Cardiovascular;  Laterality: N/A;   CHOLECYSTECTOMY     COLONOSCOPY  2005   Dr. Mavis   COLONOSCOPY N/A 07/17/2014   Procedure: COLONOSCOPY;  Surgeon: Margo CROME Harvey, MD;  Location: [facility];  Service: Endoscopy;  Laterality: N/A;  9:30-rescheduled 8/28 same time Anette Caldron to notify pt   ESOPHAGOGASTRODUODENOSCOPY N/A 07/25/2013   Shallow ulcer in the cardia, mid esophageal web, stricture at GE junction   ESOPHAGOGASTRODUODENOSCOPY N/A 02/14/2018   Procedure: ESOPHAGOGASTRODUODENOSCOPY (EGD);  Surgeon: Harvey Margo CROME, MD;  Location: [facility];  Service:  Endoscopy;  Laterality: N/A;  11:15am   ESOPHAGOGASTRODUODENOSCOPY (EGD) WITH ESOPHAGEAL DILATION N/A 08/29/2013   Candida esophagitis, small hiatal hernia, moderate nonerosive gastritis, mid esophageal web   HERNIA REPAIR     right inguinal   KIDNEY STONE SURGERY     KNEE ARTHROSCOPY Right 03/12/2023   Procedure: RIGHT KNEE ARTHROSCOPY, MEDIAL AND LATERAL MENISECTOMY;  Surgeon: Liam Lerner, MD;  Location: [facility];  Service: Orthopedics;  Laterality: Right;   NASAL SEPTUM SURGERY     PALATE SURGERY         SAVORY DILATION N/A 02/14/2018   Procedure: SAVORY DILATION;  Surgeon: Harvey Margo CROME, MD;  Location: [facility];  Service: Endoscopy;  Laterality: N/A;   TEE WITHOUT CARDIOVERSION N/A 08/10/2020   Procedure: TRANSESOPHAGEAL ECHOCARDIOGRAM (TEE);  Surgeon: Raford Riggs, MD;  Location: [facility] ENDOSCOPY;   Service: Cardiovascular;  Laterality: N/A;   THYMECTOMY     TONSILLECTOMY     UPPER GASTROINTESTINAL ENDOSCOPY  SEP 2011   SAV DIL    Past Family History   Family History  Problem Relation Age of Onset   Liver cancer Father        age 88, deceased   Colon cancer Neg Hx     Past Social History   Social History   Socioeconomic History   Marital status: Married    Spouse name: Not on file   Number of children: Not on file   Years of education: Not on file   Highest education level: Not on file  Occupational History   Not on file  Tobacco Use   Smoking status: Never   Smokeless tobacco: Never  Vaping Use   Vaping status: Never Used  Substance and Sexual Activity   Alcohol use: No   Drug use: No   Sexual activity: Yes    Partners: Female    Birth control/protection: None    Comment: spouse  Other Topics Concern   Not on file  Social History Narrative   Not on file   Social Drivers of Health   Financial Resource Strain: Not on file  Food Insecurity: Not on file  Transportation Needs: Not on file  Physical Activity: Not on file  Stress: Not on file  Social Connections: Unknown (04/03/2022)   [facility]   Social Network    Social Network: Not on file  Intimate Partner Violence: Unknown (02/23/2022)   Received from [facility]   HITS    Physically Hurt: Not on file    Insult or Talk Down To: Not on file    Threaten Physical Harm: Not on file    Scream or Curse: Not on file    Review of Systems   General: Negative for anorexia, weight loss, fever, chills, fatigue, weakness. Eyes: Negative for vision changes.  ENT: Negative for hoarseness, difficulty swallowing , nasal congestion. CV: Negative for chest pain, angina, palpitations, dyspnea on exertion, peripheral edema.  Respiratory: Negative for dyspnea at rest, dyspnea on exertion, cough, sputum, wheezing.  GI: See history of present illness. GU:  Negative for dysuria, hematuria, urinary  incontinence, urinary frequency, nocturnal urination.  MS: Negative for joint pain, low back pain.  Derm: Negative for rash or itching.  Neuro: Negative for weakness, abnormal sensation, seizure, frequent headaches, memory loss,  confusion.  Psych: Negative for anxiety, depression, suicidal ideation, hallucinations.  Endo: Negative for unusual weight change.  Heme: Negative for bruising or bleeding. Allergy: Negative for rash or hives.  Physical Exam   There  were no vitals taken for this visit.   General: Well-nourished, well-developed in no acute distress.  Head: Normocephalic, atraumatic.   Eyes: Conjunctiva pink, no icterus. Mouth: Oropharyngeal mucosa moist and pink  Neck: Supple without thyromegaly, masses, or lymphadenopathy.  Lungs: Clear to auscultation bilaterally.  Heart: Regular rate and rhythm, no murmurs rubs or gallops.  Abdomen: Bowel sounds are normal, nontender, nondistended, no hepatosplenomegaly or masses,  no abdominal bruits or hernia, no rebound or guarding.   Rectal: not performed Extremities: No lower extremity edema. No clubbing or deformities.  Neuro: Alert and oriented x 4 , grossly normal neurologically.  Skin: Warm and dry, no rash or jaundice.   Psych: Alert and cooperative, normal mood and affect.  Labs   *** Imaging Studies   No results found.  Assessment/Plan:          Sonny RAMAN. Ezzard, MHS, PA-C [facility]

## 2024-07-06 NOTE — Progress Notes (Unsigned)
 GI Office Note    Referring Provider: Marvine Rush, MD Primary Care Physician:  Marvine Rush, MD  Primary Gastroenterologist: Carlin POUR. Cindie, DO   Chief Complaint   No chief complaint on file.    History of Present Illness   [name] is a 73 y.o. male presenting today for follow-up.  He was last seen December.  He has a history of myasthenia gravis diagnosed in 2002.  He has had multiple esophageal strictures/food impactions requiring multiple EGDs with esophageal dilation.  Last EGD in 2019.  Thought to have silent reflux.    Prior Data   EGD March 2019 -Benign-appearing esophageal stricture. Dilated. -Non-bleeding Cameron's gastric ulcer with no stigmata of bleeding. Biopsied. -A few gastric polyps. Resected and retrieved. -Small hiatal hernia. -Mild Gastritis. -Path showed chronic active gastritis, negative for H.pylori, fundic gland polyp.    Colonoscopy August 2015 -Moderate diverticulosis in the descending colon and sigmoid colon -The colon mucosa was otherwise normal -Moderate sized internal hemorrhoids -The LEFT colon IS redundant -next colonoscopy in 2025.  Medications   Current Outpatient Medications  Medication Sig Dispense Refill   acyclovir  (ZOVIRAX ) 400 MG tablet Take 800 mg by mouth daily.     apixaban  (ELIQUIS ) 5 MG TABS tablet TAKE (1) TABLET BY MOUTH TWICE DAILY. 60 tablet 5   Ascorbic Acid  (VITAMIN C PO) Take 4,000 mg by mouth daily.     azaTHIOprine  (IMURAN ) 50 MG tablet Take 150 mg by mouth daily.      Calcium  Carbonate-Vit D-Min (CALCIUM  600+D3 PLUS MINERALS) 600-800 MG-UNIT TABS Take 2 tablets by mouth daily.     cetirizine (ZYRTEC) 10 MG tablet Take 10 mg by mouth daily.     fluorouracil (EFUDEX) 5 % cream Apply topically 2 (two) times daily.     fluticasone  (FLONASE ) 50 MCG/ACT nasal spray Place 2 sprays into both nostrils daily. 18.2 mL 2   glimepiride (AMARYL) 1 MG tablet Take 2 mg by mouth 2 (two) times daily.     ibuprofen  (ADVIL) 200 MG tablet Take 400 mg by mouth daily as needed for moderate pain.     losartan  (COZAAR ) 50 MG tablet TAKE (1) TABLET BY MOUTH ONCE DAILY. 90 tablet 3   metFORMIN (GLUCOPHAGE-XR) 500 MG 24 hr tablet Take 1,000 mg by mouth 2 (two) times daily.     Omega-3 Fatty Acids (FISH OIL ULTRA) 1400 MG CAPS Take 1,400 mg by mouth daily after lunch.     omeprazole  (PRILOSEC) 20 MG capsule TAKE (1) CAPSULE BY MOUTH TWICE DAILY. TAKE 1 CAPSULE 30 MINUTES BEFORE BREAKFAST, MAY TAKE ADDITIONAL ONE BEFORE EVENING MEAL IF NEEDED. 180 capsule 3   ONETOUCH VERIO test strip 1 each 3 (three) times daily.     Saw Palmetto 450 MG CAPS Take 2,250 mg by mouth daily after lunch.     SYNTHROID  137 MCG tablet Take 137 mcg by mouth daily before breakfast.     tadalafil  (CIALIS ) 20 MG tablet Take 1 tablet (20 mg total) by mouth as needed. 30 tablet 5   tamsulosin  (FLOMAX ) 0.4 MG CAPS capsule Take 1 capsule (0.4 mg total) by mouth 2 (two) times daily. 180 capsule 3   vitamin E  180 MG (400 UNITS) capsule Take 800 Units by mouth daily after lunch.     No current facility-administered medications for this visit.    Allergies   Allergies as of 07/07/2024 - Review Complete 04/08/2024  Allergen Reaction Noted   Levofloxacin Anaphylaxis 03/01/2011   Tequin [  gatifloxacin] Anaphylaxis 06/07/2012   Iodinated contrast media  02/07/2018   Levothyroxine  sodium Other (See Comments) 04/04/2021   Limonene Other (See Comments) and Rash 02/10/2015   Magnesium-containing compounds Other (See Comments) and Rash 04/01/2014   Aminoglycosides Other (See Comments) 06/07/2012   Avelox [moxifloxacin hcl in nacl] Other (See Comments) 06/07/2012   Botox [onabotulinumtoxina] Other (See Comments) 06/07/2012   Botulinum toxins Other (See Comments) 06/07/2012   Calcium  channel blockers Other (See Comments) 06/07/2012   Ciprofloxacin  Other (See Comments) 06/07/2012   Curare [tubocurarine] Other (See Comments) 06/07/2012   Dysport  [abobotulinumtoxina] Other (See Comments) 06/07/2012   Erythromycin Other (See Comments) 06/07/2012   Factive [gemifloxacin] Other (See Comments) 06/07/2012   Floxin [ofloxacin] Other (See Comments) 06/07/2012   Gentamycin [gentamicin] Other (See Comments) 06/07/2012   Interferons Other (See Comments) 06/07/2012   Kanamycin Other (See Comments) 06/07/2012   Ketek [telithromycin] Other (See Comments) 06/07/2012   Levaquin [levofloxacin in d5w] Other (See Comments) 06/07/2012   Levofloxacin Other (See Comments) 02/06/2022   Macrolides and ketolides Other (See Comments) 06/07/2012   Myobloc [rimabotulinumtoxinb] Other (See Comments) 06/07/2012   Neomycin Other (See Comments) 06/07/2012   Noroxin [norfloxacin] Other (See Comments) 06/07/2012   Other Other (See Comments) 06/07/2012   Penicillamine Other (See Comments) 06/07/2012   Procainamide Other (See Comments) 06/07/2012   Propranolol Other (See Comments) 06/07/2012   Quinidine Other (See Comments) 06/07/2012   Quinine derivatives Other (See Comments) 06/07/2012   Streptomycin Other (See Comments) 06/07/2012   Timolol Other (See Comments) 06/07/2012   Tobramycin Other (See Comments) 06/07/2012   Xeomin [incobotulinumtoxina] Other (See Comments) 06/07/2012   Zithromax [azithromycin] Other (See Comments) 06/07/2012    Past Medical History   Past Medical History:  Diagnosis Date   Candida esophagitis (HCC) 08/2013   Deafness in right ear    Diabetes mellitus without complication (HCC)    Difficult intubation    Esophageal stricture 07/2010   FOOD IMPACTION-->SAV DIL 16 MM   GERD (gastroesophageal reflux disease)    History of kidney stones    Hypertension    Hypothyroidism    Kidney stone    Myasthenia gravis (HCC)    Sleep apnea    No CPAP   Thyroid  disease     Past Surgical History   Past Surgical History:  Procedure Laterality Date   ATRIAL FIBRILLATION ABLATION N/A 08/12/2020   Procedure: ATRIAL FIBRILLATION  ABLATION;  Surgeon: Cindie Ole DASEN, MD;  Location: [facility];  Service: Cardiovascular;  Laterality: N/A;   BIOPSY  02/14/2018   Procedure: BIOPSY;  Surgeon: Harvey Margo CROME, MD;  Location: [facility];  Service: Endoscopy;;  gastric   CARDIOVERSION N/A 06/08/2020   Procedure: CARDIOVERSION;  Surgeon: Alvan Dorn FALCON, MD;  Location: [facility];  Service: Endoscopy;  Laterality: N/A;   CARDIOVERSION N/A 09/16/2020   Procedure: CARDIOVERSION;  Surgeon: Hobart Powell BRAVO, MD;  Location: [facility] ENDOSCOPY;  Service: Cardiovascular;  Laterality: N/A;   CHOLECYSTECTOMY     COLONOSCOPY  2005   Dr. Mavis   COLONOSCOPY N/A 07/17/2014   Procedure: COLONOSCOPY;  Surgeon: Margo CROME Harvey, MD;  Location: [facility];  Service: Endoscopy;  Laterality: N/A;  9:30-rescheduled 8/28 same time Anette Caldron to notify pt   ESOPHAGOGASTRODUODENOSCOPY N/A 07/25/2013   Shallow ulcer in the cardia, mid esophageal web, stricture at GE junction   ESOPHAGOGASTRODUODENOSCOPY N/A 02/14/2018   Procedure: ESOPHAGOGASTRODUODENOSCOPY (EGD);  Surgeon: Harvey Margo CROME, MD;  Location: [facility];  Service:  Endoscopy;  Laterality: N/A;  11:15am   ESOPHAGOGASTRODUODENOSCOPY (EGD) WITH ESOPHAGEAL DILATION N/A 08/29/2013   Candida esophagitis, small hiatal hernia, moderate nonerosive gastritis, mid esophageal web   HERNIA REPAIR     right inguinal   KIDNEY STONE SURGERY     KNEE ARTHROSCOPY Right 03/12/2023   Procedure: RIGHT KNEE ARTHROSCOPY, MEDIAL AND LATERAL MENISECTOMY;  Surgeon: Liam Lerner, MD;  Location: [facility];  Service: Orthopedics;  Laterality: Right;   NASAL SEPTUM SURGERY     PALATE SURGERY         SAVORY DILATION N/A 02/14/2018   Procedure: SAVORY DILATION;  Surgeon: Harvey Margo CROME, MD;  Location: [facility];  Service: Endoscopy;  Laterality: N/A;   TEE WITHOUT CARDIOVERSION N/A 08/10/2020   Procedure: TRANSESOPHAGEAL ECHOCARDIOGRAM (TEE);  Surgeon: Raford Riggs, MD;  Location: [facility] ENDOSCOPY;   Service: Cardiovascular;  Laterality: N/A;   THYMECTOMY     TONSILLECTOMY     UPPER GASTROINTESTINAL ENDOSCOPY  SEP 2011   SAV DIL    Past Family History   Family History  Problem Relation Age of Onset   Liver cancer Father        age 88, deceased   Colon cancer Neg Hx     Past Social History   Social History   Socioeconomic History   Marital status: Married    Spouse name: Not on file   Number of children: Not on file   Years of education: Not on file   Highest education level: Not on file  Occupational History   Not on file  Tobacco Use   Smoking status: Never   Smokeless tobacco: Never  Vaping Use   Vaping status: Never Used  Substance and Sexual Activity   Alcohol use: No   Drug use: No   Sexual activity: Yes    Partners: Female    Birth control/protection: None    Comment: spouse  Other Topics Concern   Not on file  Social History Narrative   Not on file   Social Drivers of Health   Financial Resource Strain: Not on file  Food Insecurity: Not on file  Transportation Needs: Not on file  Physical Activity: Not on file  Stress: Not on file  Social Connections: Unknown (04/03/2022)   [facility]   Social Network    Social Network: Not on file  Intimate Partner Violence: Unknown (02/23/2022)   Received from [facility]   HITS    Physically Hurt: Not on file    Insult or Talk Down To: Not on file    Threaten Physical Harm: Not on file    Scream or Curse: Not on file    Review of Systems   General: Negative for anorexia, weight loss, fever, chills, fatigue, weakness. Eyes: Negative for vision changes.  ENT: Negative for hoarseness, difficulty swallowing , nasal congestion. CV: Negative for chest pain, angina, palpitations, dyspnea on exertion, peripheral edema.  Respiratory: Negative for dyspnea at rest, dyspnea on exertion, cough, sputum, wheezing.  GI: See history of present illness. GU:  Negative for dysuria, hematuria, urinary  incontinence, urinary frequency, nocturnal urination.  MS: Negative for joint pain, low back pain.  Derm: Negative for rash or itching.  Neuro: Negative for weakness, abnormal sensation, seizure, frequent headaches, memory loss,  confusion.  Psych: Negative for anxiety, depression, suicidal ideation, hallucinations.  Endo: Negative for unusual weight change.  Heme: Negative for bruising or bleeding. Allergy: Negative for rash or hives.  Physical Exam   There  were no vitals taken for this visit.   General: Well-nourished, well-developed in no acute distress.  Head: Normocephalic, atraumatic.   Eyes: Conjunctiva pink, no icterus. Mouth: Oropharyngeal mucosa moist and pink  Neck: Supple without thyromegaly, masses, or lymphadenopathy.  Lungs: Clear to auscultation bilaterally.  Heart: Regular rate and rhythm, no murmurs rubs or gallops.  Abdomen: Bowel sounds are normal, nontender, nondistended, no hepatosplenomegaly or masses,  no abdominal bruits or hernia, no rebound or guarding.   Rectal: not performed Extremities: No lower extremity edema. No clubbing or deformities.  Neuro: Alert and oriented x 4 , grossly normal neurologically.  Skin: Warm and dry, no rash or jaundice.   Psych: Alert and cooperative, normal mood and affect.  Labs   *** Imaging Studies   No results found.  Assessment/Plan:          Sonny RAMAN. Ezzard, MHS, PA-C [facility]

## 2024-07-07 ENCOUNTER — Ambulatory Visit (INDEPENDENT_AMBULATORY_CARE_PROVIDER_SITE_OTHER): Admitting: Gastroenterology

## 2024-07-07 ENCOUNTER — Encounter: Payer: Self-pay | Admitting: Gastroenterology

## 2024-07-07 VITALS — BP 129/73 | HR 88 | Temp 98.4°F | Ht 73.0 in | Wt 217.4 lb

## 2024-07-07 DIAGNOSIS — G7000 Myasthenia gravis without (acute) exacerbation: Secondary | ICD-10-CM

## 2024-07-07 DIAGNOSIS — Z09 Encounter for follow-up examination after completed treatment for conditions other than malignant neoplasm: Secondary | ICD-10-CM | POA: Diagnosis not present

## 2024-07-07 DIAGNOSIS — Z8719 Personal history of other diseases of the digestive system: Secondary | ICD-10-CM | POA: Diagnosis not present

## 2024-07-07 DIAGNOSIS — Z1211 Encounter for screening for malignant neoplasm of colon: Secondary | ICD-10-CM

## 2024-07-07 DIAGNOSIS — K222 Esophageal obstruction: Secondary | ICD-10-CM

## 2024-07-07 DIAGNOSIS — Z8711 Personal history of peptic ulcer disease: Secondary | ICD-10-CM | POA: Diagnosis not present

## 2024-07-07 DIAGNOSIS — K219 Gastro-esophageal reflux disease without esophagitis: Secondary | ICD-10-CM

## 2024-07-07 NOTE — Patient Instructions (Signed)
 We will be in touch in couple of days to get you scheduled after discussing bowel prep and possible upper endoscopy along with your colonoscopy with Dr. Cindie.

## 2024-07-08 ENCOUNTER — Telehealth: Payer: Self-pay | Admitting: Gastroenterology

## 2024-07-08 NOTE — Telephone Encounter (Signed)
 Please let patient know that Dr. Cindie agrees with also doing EGD at time of colonoscopy.   Please schedule colonoscopy/EGD+/-esophageal dilation  Dx: screen colonoscopy, history of esophageal strictures, history of silent reflux, h/o gastric ulcers.  ASA 3. Room 3.  PATIENT CANNOT HAVE MAGNESIUM BASED PREPS. PLEASE GIVE HIM TRILYTE  AND TAP WATER  ENEMA  Hold vit E for 10 days.  Day of prep: glimepiride am only, metformin am only AM of procedures: hold above

## 2024-07-09 MED ORDER — PEG 3350-KCL-NA BICARB-NACL 420 G PO SOLR: 4000.0000 mL | ORAL | 0 refills | Status: DC

## 2024-07-09 NOTE — Telephone Encounter (Signed)
 Spoke with pt. He has been scheduled for 9/3. Aware will send instructions and pre-op appt via mychart. Rx for prep to be sent to pharmacy. Aware when to start holding vit e

## 2024-07-09 NOTE — Addendum Note (Signed)
 Addended by: JEANELL GRAEME RAMAN on: 07/09/2024 02:30 PM   Modules accepted: Orders

## 2024-07-10 ENCOUNTER — Encounter: Payer: Self-pay | Admitting: *Deleted

## 2024-07-18 ENCOUNTER — Encounter (HOSPITAL_COMMUNITY)

## 2024-07-18 NOTE — Patient Instructions (Signed)
 [name]  07/18/2024     @PREFPERIOPPHARMACY @   Your procedure is scheduled on  07/23/2024.   Report to [name] at  0730  A.M.   Call this number if you have problems the morning of surgery:  [phone]  If you experience any cold or flu symptoms such as cough, fever, chills, shortness of breath, etc. between now and your scheduled surgery, please notify us  at the above number.   Remember:  Follow the diet and prep instructions given to you by the office.    You may drink clear liquids until  0530 am on 07/23/2024.    Clear liquids allowed are:                    Water , Juice (No red color; non-citric and without pulp; diabetics please choose diet or no sugar options), Carbonated beverages (diabetics please choose diet or no sugar options), Clear Tea (No creamer, milk, or cream, including half & half and powdered creamer), Black Coffee Only (No creamer, milk or cream, including half & half and powdered creamer), and Clear Sports drink (No red color; diabetics please choose diet or no sugar options)    Take these medicines the morning of surgery with A SIP OF WATER                       Azathioprine , omeprazole , synthroid , tamsulosin .    Do not wear jewelry, make-up or nail polish, including gel polish,  artificial nails, or any other type of covering on natural nails (fingers and  toes).  Do not wear lotions, powders, or perfumes, or deodorant.  Do not shave 48 hours prior to surgery.  Men may shave face and neck.  Do not bring valuables to the hospital.  [facility] is not responsible for any belongings or valuables.  Contacts, dentures or bridgework may not be worn into surgery.  Leave your suitcase in the car.  After surgery it may be brought to your room.  For patients admitted to the hospital, discharge time will be determined by your treatment team.  Patients discharged the day of surgery will not be allowed to drive home and must have someone with  them for 24 hours.    Special instructions:   DO NOT smoke tobacco or vape for 24 hours before your procedure.  Please read over the following fact sheets that you were given. Anesthesia Post-op Instructions and Care and Recovery After Surgery      Upper Endoscopy, Adult, Care After After the procedure, it is common to have a sore throat. It is also common to have: Mild stomach pain or discomfort. Bloating. Nausea. Follow these instructions at home: The instructions below may help you care for yourself at home. Your health care provider may give you more instructions. If you have questions, ask your health care provider. If you were given a sedative during the procedure, it can affect you for several hours. Do not drive or operate machinery until your health care provider says that it is safe. If you will be going home right after the procedure, plan to have a responsible adult: Take you home from the hospital or clinic. You will not be allowed to drive. Care for you for the time you are told. Follow instructions from your health care provider about what you may eat and drink. Return to your normal activities as told by your health care  provider. Ask your health care provider what activities are safe for you. Take over-the-counter and prescription medicines only as told by your health care provider. Contact a health care provider if you: Have a sore throat that lasts longer than one day. Have trouble swallowing. Have a fever. Get help right away if you: Vomit blood or your vomit looks like coffee grounds. Have bloody, black, or tarry stools. Have a very bad sore throat or you cannot swallow. Have difficulty breathing or very bad pain in your chest or abdomen. These symptoms may be an emergency. Get help right away. Call 911. Do not wait to see if the symptoms will go away. Do not drive yourself to the hospital. Summary After the procedure, it is common to have a sore throat, mild  stomach discomfort, bloating, and nausea. If you were given a sedative during the procedure, it can affect you for several hours. Do not drive until your health care provider says that it is safe. Follow instructions from your health care provider about what you may eat and drink. Return to your normal activities as told by your health care provider. This information is not intended to replace advice given to you by your health care provider. Make sure you discuss any questions you have with your health care provider. Document Revised: 02/15/2022 Document Reviewed: 02/15/2022 Elsevier Patient Education  2024 [employer]Esophageal Dilatation Esophageal dilatation, or dilation, is done to stretch a blocked or narrowed part of your esophagus. The esophagus is the part of your body that moves food from your mouth to your stomach. You may need to have it stretched if: You have a lot of scar tissue and it makes it hard or painful to swallow. You have cancer of the esophagus. There's a problem with how food moves through your esophagus. In some cases, you may need to have this procedure done more than once. Tell a health care provider about: Any allergies you have. All medicines you're taking, including vitamins, herbs, eye drops, creams, and over-the-counter medicines. Any problems you or family members have had with anesthesia. Any bleeding problems you have. Any surgeries you've had. Any medical conditions you have. Whether you're pregnant or may be pregnant. What are the risks? Your health care provider will talk with you about risks. These may include: Bleeding. A hole or tear in your esophagus. What happens before the procedure? When to stop eating and drinking Follow instructions from your provider about what you may eat and drink. These may include: 8 hours before your procedure Stop eating most foods. Do not eat meat, fried foods, or fatty foods. Eat only light foods, such as toast  or crackers. All liquids are okay except energy drinks and alcohol. 6 hours before your procedure Stop eating. Drink only clear liquids, such as water , clear fruit juice, black coffee, plain tea, and sports drinks. Do not drink energy drinks or alcohol. 2 hours before your procedure Stop drinking all liquids. You may be allowed to take medicines with small sips of water . If you don't follow your provider's instructions, your procedure may be delayed or canceled. Medicines Ask your provider about: Changing or stopping your regular medicines. These include any diabetes medicines or blood thinners you take. Taking medicines such as aspirin and ibuprofen. These medicines can thin your blood. Do not take them unless your provider tells you to. Taking over-the-counter medicines, vitamins, herbs, and supplements. General instructions If you'll be going home right after the procedure, plan to have a responsible  adult: Take you home from the hospital or clinic. You won't be allowed to drive. Care for you for the time you're told. What happens during the procedure? You may be given: A sedative. This helps you relax. Anesthesia. This keeps you from feeling pain. It will numb certain areas of your body. The stretching may be done with: Simple dilators. These are tools put in your esophagus to stretch it. Guide wires. These wires are put in using a tube called an endoscope. A dilator is put over the wires to stretch your esophagus. Then the wires are taken out. A balloon. The balloon is on the end of a tube. It's inflated to stretch your esophagus. The procedure may vary among providers and hospitals. What can I expect after the procedure? Your blood pressure, heart rate, breathing rate, and blood oxygen level will be monitored until you leave the hospital or clinic. Your throat may feel sore and numb. This will get better over time. You won't be allowed to eat or drink until your throat is no  longer numb. You may be able to go home when you can: Drink. Pee. Sit on the edge of the bed without nausea or dizziness. Follow these instructions at home: Activity If you were given a sedative during the procedure, it can affect you for several hours. Do not drive or operate machinery until your provider says it's safe. Return to your normal activities as told by your provider. Ask your provider what activities are safe for you. General instructions Take over-the-counter and prescription medicines only as told by your provider. Follow instructions from your provider about what you may eat and drink. Do not use any products that contain nicotine or tobacco. These products include cigarettes, chewing tobacco, and vaping devices, such as e-cigarettes. If you need help quitting, ask your provider. Keep all follow-up visits. Your provider will make sure the procedure worked. Where to find more information American Society for Gastrointestinal Endoscopy (ASGE): asge.org Contact a health care provider if: You have trouble swallowing. You have a fever. Your pain doesn't get better with medicine. Get help right away if: You have chest pain. You have trouble breathing. You vomit blood. Your poop is: Black. Tarry. Bloody. These symptoms may be an emergency. Get help right away. Call 911. Do not wait to see if the symptoms will go away. Do not drive yourself to the hospital. This information is not intended to replace advice given to you by your health care provider. Make sure you discuss any questions you have with your health care provider. Document Revised: 02/02/2023 Document Reviewed: 02/02/2023 Elsevier Patient Education  2024 [employer].Colonoscopy, Adult, Care After The following information offers guidance on how to care for yourself after your procedure. Your health care provider may also give you more specific instructions. If you have problems or questions, contact your health  care provider. What can I expect after the procedure? After the procedure, it is common to have: A small amount of blood in your stool for 24 hours after the procedure. Some gas. Mild cramping or bloating of your abdomen. Follow these instructions at home: Eating and drinking  Drink enough fluid to keep your urine pale yellow. Follow instructions from your health care provider about eating or drinking restrictions. Resume your normal diet as told by your health care provider. Avoid heavy or fried foods that are hard to digest. Activity Rest as told by your health care provider. Avoid sitting for a long time without moving. Get  up to take short walks every 1-2 hours. This is important to improve blood flow and breathing. Ask for help if you feel weak or unsteady. Return to your normal activities as told by your health care provider. Ask your health care provider what activities are safe for you. Managing cramping and bloating  Try walking around when you have cramps or feel bloated. If directed, apply heat to your abdomen as told by your health care provider. Use the heat source that your health care provider recommends, such as a moist heat pack or a heating pad. Place a towel between your skin and the heat source. Leave the heat on for 20-30 minutes. Remove the heat if your skin turns bright red. This is especially important if you are unable to feel pain, heat, or cold. You have a greater risk of getting burned. General instructions If you were given a sedative during the procedure, it can affect you for several hours. Do not drive or operate machinery until your health care provider says that it is safe. For the first 24 hours after the procedure: Do not sign important documents. Do not drink alcohol. Do your regular daily activities at a slower pace than normal. Eat soft foods that are easy to digest. Take over-the-counter and prescription medicines only as told by your health care  provider. Keep all follow-up visits. This is important. Contact a health care provider if: You have blood in your stool 2-3 days after the procedure. Get help right away if: You have more than a small spotting of blood in your stool. You have large blood clots in your stool. You have swelling of your abdomen. You have nausea or vomiting. You have a fever. You have increasing pain in your abdomen that is not relieved with medicine. These symptoms may be an emergency. Get help right away. Call 911. Do not wait to see if the symptoms will go away. Do not drive yourself to the hospital. Summary After the procedure, it is common to have a small amount of blood in your stool. You may also have mild cramping and bloating of your abdomen. If you were given a sedative during the procedure, it can affect you for several hours. Do not drive or operate machinery until your health care provider says that it is safe. Get help right away if you have a lot of blood in your stool, nausea or vomiting, a fever, or increased pain in your abdomen. This information is not intended to replace advice given to you by your health care provider. Make sure you discuss any questions you have with your health care provider. Document Revised: 12/19/2022 Document Reviewed: 06/29/2021 Elsevier Patient Education  2024 Else[employer] Inc.General Anesthesia, Adult, Care After The following information offers guidance on how to care for yourself after your procedure. Your health care provider may also give you more specific instructions. If you have problems or questions, contact your health care provider. What can I expect after the procedure? After the procedure, it is common for people to: Have pain or discomfort at the IV site. Have nausea or vomiting. Have a sore throat or hoarseness. Have trouble concentrating. Feel cold or chills. Feel weak, sleepy, or tired (fatigue). Have soreness and body aches. These can affect parts  of the body that were not involved in surgery. Follow these instructions at home: For the time period you were told by your health care provider:  Rest. Do not participate in activities where you could fall or become  injured. Do not drive or use machinery. Do not drink alcohol. Do not take sleeping pills or medicines that cause drowsiness. Do not make important decisions or sign legal documents. Do not take care of children on your own. General instructions Drink enough fluid to keep your urine pale yellow. If you have sleep apnea, surgery and certain medicines can increase your risk for breathing problems. Follow instructions from your health care provider about wearing your sleep device: Anytime you are sleeping, including during daytime naps. While taking prescription pain medicines, sleeping medicines, or medicines that make you drowsy. Return to your normal activities as told by your health care provider. Ask your health care provider what activities are safe for you. Take over-the-counter and prescription medicines only as told by your health care provider. Do not use any products that contain nicotine or tobacco. These products include cigarettes, chewing tobacco, and vaping devices, such as e-cigarettes. These can delay incision healing after surgery. If you need help quitting, ask your health care provider. Contact a health care provider if: You have nausea or vomiting that does not get better with medicine. You vomit every time you eat or drink. You have pain that does not get better with medicine. You cannot urinate or have bloody urine. You develop a skin rash. You have a fever. Get help right away if: You have trouble breathing. You have chest pain. You vomit blood. These symptoms may be an emergency. Get help right away. Call 911. Do not wait to see if the symptoms will go away. Do not drive yourself to the hospital. Summary After the procedure, it is common to have a  sore throat, hoarseness, nausea, vomiting, or to feel weak, sleepy, or fatigue. For the time period you were told by your health care provider, do not drive or use machinery. Get help right away if you have difficulty breathing, have chest pain, or vomit blood. These symptoms may be an emergency. This information is not intended to replace advice given to you by your health care provider. Make sure you discuss any questions you have with your health care provider. Document Revised: 02/03/2022 Document Reviewed: 02/03/2022 Elsevier Patient Education  2024 [employer].

## 2024-07-22 ENCOUNTER — Encounter (HOSPITAL_COMMUNITY)
Admission: RE | Admit: 2024-07-22 | Discharge: 2024-07-22 | Disposition: A | Discharge status: Home / Self Care | Source: Ambulatory Visit | Attending: Internal Medicine | Admitting: Internal Medicine

## 2024-07-22 VITALS — BP 129/73 | HR 88 | Resp 18 | Ht 73.0 in | Wt 217.4 lb

## 2024-07-22 DIAGNOSIS — E1165 Type 2 diabetes mellitus with hyperglycemia: Secondary | ICD-10-CM | POA: Insufficient documentation

## 2024-07-22 DIAGNOSIS — Z01812 Encounter for preprocedural laboratory examination: Secondary | ICD-10-CM | POA: Insufficient documentation

## 2024-07-22 LAB — BASIC METABOLIC PANEL WITH GFR
Anion gap: 10 (ref 5–15)
BUN: 15 mg/dL (ref 8–23)
CO2: 23 mmol/L (ref 22–32)
Calcium: 9 mg/dL (ref 8.9–10.3)
Chloride: 103 mmol/L (ref 98–111)
Creatinine, Ser: 1.19 mg/dL (ref 0.61–1.24)
GFR, Estimated: >60 mL/min (ref >60)
Glucose, Bld: 204 mg/dL — ABNORMAL HIGH (ref 70–99)
Potassium: 4.4 mmol/L (ref 3.5–5.1)
Sodium: 136 mmol/L (ref 135–145)

## 2024-07-23 ENCOUNTER — Ambulatory Visit (HOSPITAL_BASED_OUTPATIENT_CLINIC_OR_DEPARTMENT_OTHER): Admitting: Anesthesiology

## 2024-07-23 ENCOUNTER — Other Ambulatory Visit: Payer: Self-pay

## 2024-07-23 ENCOUNTER — Ambulatory Visit (HOSPITAL_COMMUNITY): Admitting: Anesthesiology

## 2024-07-23 ENCOUNTER — Ambulatory Visit (HOSPITAL_COMMUNITY)
Admission: RE | Admit: 2024-07-23 | Discharge: 2024-07-23 | Disposition: A | Discharge status: Home / Self Care | Attending: Internal Medicine | Admitting: Internal Medicine

## 2024-07-23 ENCOUNTER — Encounter (HOSPITAL_COMMUNITY): Payer: Self-pay | Admitting: Internal Medicine

## 2024-07-23 ENCOUNTER — Encounter (HOSPITAL_COMMUNITY)
Admission: RE | Disposition: A | Discharge status: Home / Self Care | Payer: Self-pay | Source: Home / Self Care | Attending: Internal Medicine

## 2024-07-23 DIAGNOSIS — Z79899 Other long term (current) drug therapy: Secondary | ICD-10-CM | POA: Insufficient documentation

## 2024-07-23 DIAGNOSIS — Z7984 Long term (current) use of oral hypoglycemic drugs: Secondary | ICD-10-CM | POA: Insufficient documentation

## 2024-07-23 DIAGNOSIS — K2289 Other specified disease of esophagus: Secondary | ICD-10-CM | POA: Insufficient documentation

## 2024-07-23 DIAGNOSIS — I10 Essential (primary) hypertension: Secondary | ICD-10-CM | POA: Diagnosis not present

## 2024-07-23 DIAGNOSIS — N289 Disorder of kidney and ureter, unspecified: Secondary | ICD-10-CM | POA: Diagnosis not present

## 2024-07-23 DIAGNOSIS — G709 Myoneural disorder, unspecified: Secondary | ICD-10-CM | POA: Diagnosis not present

## 2024-07-23 DIAGNOSIS — K2950 Unspecified chronic gastritis without bleeding: Secondary | ICD-10-CM | POA: Diagnosis not present

## 2024-07-23 DIAGNOSIS — G7000 Myasthenia gravis without (acute) exacerbation: Secondary | ICD-10-CM | POA: Insufficient documentation

## 2024-07-23 DIAGNOSIS — K648 Other hemorrhoids: Secondary | ICD-10-CM | POA: Insufficient documentation

## 2024-07-23 DIAGNOSIS — K449 Diaphragmatic hernia without obstruction or gangrene: Secondary | ICD-10-CM | POA: Insufficient documentation

## 2024-07-23 DIAGNOSIS — D120 Benign neoplasm of cecum: Secondary | ICD-10-CM | POA: Insufficient documentation

## 2024-07-23 DIAGNOSIS — K635 Polyp of colon: Secondary | ICD-10-CM | POA: Insufficient documentation

## 2024-07-23 DIAGNOSIS — Z1211 Encounter for screening for malignant neoplasm of colon: Secondary | ICD-10-CM | POA: Diagnosis not present

## 2024-07-23 DIAGNOSIS — G4730 Sleep apnea, unspecified: Secondary | ICD-10-CM | POA: Diagnosis not present

## 2024-07-23 DIAGNOSIS — Z13810 Encounter for screening for upper gastrointestinal disorder: Secondary | ICD-10-CM | POA: Diagnosis not present

## 2024-07-23 DIAGNOSIS — K5730 Diverticulosis of large intestine without perforation or abscess without bleeding: Secondary | ICD-10-CM | POA: Insufficient documentation

## 2024-07-23 DIAGNOSIS — E119 Type 2 diabetes mellitus without complications: Secondary | ICD-10-CM | POA: Diagnosis not present

## 2024-07-23 DIAGNOSIS — D122 Benign neoplasm of ascending colon: Secondary | ICD-10-CM | POA: Insufficient documentation

## 2024-07-23 DIAGNOSIS — K2960 Other gastritis without bleeding: Secondary | ICD-10-CM

## 2024-07-23 DIAGNOSIS — E039 Hypothyroidism, unspecified: Secondary | ICD-10-CM | POA: Diagnosis not present

## 2024-07-23 DIAGNOSIS — Z8719 Personal history of other diseases of the digestive system: Secondary | ICD-10-CM | POA: Diagnosis not present

## 2024-07-23 DIAGNOSIS — K2100 Gastro-esophageal reflux disease with esophagitis, without bleeding: Secondary | ICD-10-CM | POA: Diagnosis not present

## 2024-07-23 DIAGNOSIS — K5140 Inflammatory polyps of colon without complications: Secondary | ICD-10-CM | POA: Diagnosis not present

## 2024-07-23 HISTORY — PX: COLONOSCOPY: SHX5424

## 2024-07-23 HISTORY — PX: ESOPHAGOGASTRODUODENOSCOPY: SHX5428

## 2024-07-23 LAB — KOH PREP

## 2024-07-23 LAB — GLUCOSE, CAPILLARY: Glucose-Capillary: 163 mg/dL — ABNORMAL HIGH (ref 70–99)

## 2024-07-23 SURGERY — COLONOSCOPY
Anesthesia: General

## 2024-07-23 MED ORDER — PROPOFOL 10 MG/ML IV BOLUS
INTRAVENOUS | Status: DC | PRN
  Administered 2024-07-23 (×5): 50 mg via INTRAVENOUS
  Administered 2024-07-23: 100 mg via INTRAVENOUS
  Administered 2024-07-23: 50 mg via INTRAVENOUS

## 2024-07-23 MED ORDER — LACTATED RINGERS IV SOLN: INTRAVENOUS | Status: DC

## 2024-07-23 MED ORDER — LIDOCAINE HCL (CARDIAC) PF 100 MG/5ML IV SOSY
PREFILLED_SYRINGE | INTRAVENOUS | Status: DC | PRN
  Administered 2024-07-23: 100 mg via INTRAVENOUS

## 2024-07-23 NOTE — Transfer of Care (Signed)
 Immediate Anesthesia Transfer of Care Note  Patient: [name]  Procedure(s) Performed: COLONOSCOPY EGD (ESOPHAGOGASTRODUODENOSCOPY)  Patient Location: Short Stay  Anesthesia Type:General  Level of Consciousness: drowsy, patient cooperative, and responds to stimulation  Airway & Oxygen Therapy: Patient Spontanous Breathing  Post-op Assessment: Report given to RN and Post -op Vital signs reviewed and stable  Post vital signs: Reviewed and stable  Last Vitals:  Vitals Value Taken Time  BP 105/52 07/23/24 10:13  Temp 36.4 C 07/23/24 10:13  Pulse 65 07/23/24 10:13  Resp    SpO2 99 % 07/23/24 10:13    Last Pain:  Vitals:   07/23/24 1013  TempSrc: Oral  PainSc:          Complications: No notable events documented.

## 2024-07-23 NOTE — Discharge Instructions (Addendum)
 EGD Discharge instructions Please read the instructions outlined below and refer to this sheet in the next few weeks. These discharge instructions provide you with general information on caring for yourself after you leave the hospital. Your doctor may also give you specific instructions. While your treatment has been planned according to the most current medical practices available, unavoidable complications occasionally occur. If you have any problems or questions after discharge, please call your doctor. ACTIVITY You may resume your regular activity but move at a slower pace for the next 24 hours.  Take frequent rest periods for the next 24 hours.  Walking will help expel (get rid of) the air and reduce the bloated feeling in your abdomen.  No driving for 24 hours (because of the anesthesia (medicine) used during the test).  You may shower.  Do not sign any important legal documents or operate any machinery for 24 hours (because of the anesthesia used during the test).  NUTRITION Drink plenty of fluids.  You may resume your normal diet.  Begin with a light meal and progress to your normal diet.  Avoid alcoholic beverages for 24 hours or as instructed by your caregiver.  MEDICATIONS You may resume your normal medications unless your caregiver tells you otherwise.  WHAT YOU CAN EXPECT TODAY You may experience abdominal discomfort such as a feeling of fullness or "gas" pains.  FOLLOW-UP Your doctor will discuss the results of your test with you.  SEEK IMMEDIATE MEDICAL ATTENTION IF ANY OF THE FOLLOWING OCCUR: Excessive nausea (feeling sick to your stomach) and/or vomiting.  Severe abdominal pain and distention (swelling).  Trouble swallowing.  Temperature over 101 F (37.8 C).  Rectal bleeding or vomiting of blood.     Colonoscopy Discharge Instructions  Read the instructions outlined below and refer to this sheet in the next few weeks. These discharge instructions provide you with  general information on caring for yourself after you leave the hospital. Your doctor may also give you specific instructions. While your treatment has been planned according to the most current medical practices available, unavoidable complications occasionally occur.   ACTIVITY You may resume your regular activity, but move at a slower pace for the next 24 hours.  Take frequent rest periods for the next 24 hours.  Walking will help get rid of the air and reduce the bloated feeling in your belly (abdomen).  No driving for 24 hours (because of the medicine (anesthesia) used during the test).   Do not sign any important legal documents or operate any machinery for 24 hours (because of the anesthesia used during the test).  NUTRITION Drink plenty of fluids.  You may resume your normal diet as instructed by your doctor.  Begin with a light meal and progress to your normal diet. Heavy or fried foods are harder to digest and may make you feel sick to your stomach (nauseated).  Avoid alcoholic beverages for 24 hours or as instructed.  MEDICATIONS You may resume your normal medications unless your doctor tells you otherwise.  WHAT YOU CAN EXPECT TODAY Some feelings of bloating in the abdomen.  Passage of more gas than usual.  Spotting of blood in your stool or on the toilet paper.  IF YOU HAD POLYPS REMOVED DURING THE COLONOSCOPY: No aspirin products for 7 days or as instructed.  No alcohol for 7 days or as instructed.  Eat a soft diet for the next 24 hours.  FINDING OUT THE RESULTS OF YOUR TEST Not all test results are  available during your visit. If your test results are not back during the visit, make an appointment with your caregiver to find out the results. Do not assume everything is normal if you have not heard from your caregiver or the medical facility. It is important for you to follow up on all of your test results.  SEEK IMMEDIATE MEDICAL ATTENTION IF: You have more than a spotting of  blood in your stool.  Your belly is swollen (abdominal distention).  You are nauseated or vomiting.  You have a temperature over 101.  You have abdominal pain or discomfort that is severe or gets worse throughout the day.   Your EGD revealed mild amount inflammation in your stomach.  I took biopsies of this to rule out infection with a bacteria called H. pylori.  You also may have a yeast infection of your esophagus.  I took samples of this as well.  Await pathology results, my office will contact you.  Small hiatal hernia noted.  Small bowel appeared normal.  Your colonoscopy revealed 2 polyp(s) which I removed successfully.  We will call with these results as well.  Given your age, I do not think you need further colonoscopies for polyp surveillance purposes.  You also have diverticulosis and internal hemorrhoids. I would recommend increasing fiber in your diet or adding OTC Benefiber/Metamucil. Be sure to drink at least 4 to 6 glasses of water  daily.   Follow-up with GI in 3 months   I hope you have a great rest of your week!  Carlin POUR. Cindie, D.O. Gastroenterology and Hepatology [facility]

## 2024-07-23 NOTE — Op Note (Signed)
 [facility] Patient Name: [name] Procedure Date: 07/23/2024 9:35 AM MRN: [id number] Date of Birth: [birth year] Attending MD: Carlin POUR. Cindie , [place], [id number] CSN: [id number] Age: 73 Admit Type: Outpatient Procedure:                Colonoscopy Indications:              Screening for colorectal malignant neoplasm Providers:                Carlin POUR. Cindie, DO, Devere Lodge, Jon Loge Referring MD:              Medicines:                See the Anesthesia note for documentation of the                            administered medications Complications:            No immediate complications. Estimated Blood Loss:     Estimated blood loss was minimal. Procedure:                Pre-Anesthesia Assessment:                           - The anesthesia plan was to use monitored                            anesthesia care (MAC).                           After obtaining informed consent, the colonoscope                            was passed under direct vision. Throughout the                            procedure, the patient's blood pressure, pulse, and                            oxygen saturations were monitored continuously. The                            [id number]-HQ190L ([id number]) Peds Colon was introduced                            through the anus and advanced to the the cecum,                            identified by appendiceal orifice and ileocecal                            valve. The colonoscopy was performed without                            difficulty. The patient tolerated the procedure                            well. The  quality of the bowel preparation was                            evaluated using the BBPS [facility] Bowel Preparation                            Scale) with scores of: Right Colon = 3, Transverse                            Colon = 3 and Left Colon = 3 (entire mucosa seen                            well with no residual staining, small fragments of                             stool or opaque liquid). The total BBPS score                            equals 9. Scope In: 9:56:24 AM Scope Out: 10:10:22 AM Scope Withdrawal Time: 0 hours 12 minutes 26 seconds  Total Procedure Duration: 0 hours 13 minutes 58 seconds  Findings:      Non-bleeding internal hemorrhoids were found.      Multiple medium-mouthed and small-mouthed diverticula were found in the       sigmoid colon, descending colon and transverse colon.      Two sessile polyps were found in the ascending colon and cecum. The       polyps were 5 to 7 mm in size. These polyps were removed with a cold       snare. Resection and retrieval were complete.      The exam was otherwise without abnormality. Impression:               - Non-bleeding internal hemorrhoids.                           - Diverticulosis in the sigmoid colon, in the                            descending colon and in the transverse colon.                           - Two 5 to 7 mm polyps in the ascending colon and                            in the cecum, removed with a cold snare. Resected                            and retrieved.                           - The examination was otherwise normal. Moderate Sedation:      Per Anesthesia Care Recommendation:           - Patient has a contact number available for  emergencies. The signs and symptoms of potential                            delayed complications were discussed with the                            patient. Return to normal activities tomorrow.                            Written discharge instructions were provided to the                            patient.                           - Resume previous diet.                           - Continue present medications.                           - Await pathology results.                           - No repeat colonoscopy due to age.                           - Return to GI clinic in 3  months. Procedure Code(s):        --- Professional ---                           [phone], Colonoscopy, flexible; with removal of                            tumor(s), polyp(s), or other lesion(s) by snare                            technique Diagnosis Code(s):        --- Professional ---                           Z12.11, Encounter for screening for malignant                            neoplasm of colon                           K64.8, Other hemorrhoids                           D12.2, Benign neoplasm of ascending colon                           D12.0, Benign neoplasm of cecum                           K57.30, Diverticulosis of large intestine without  perforation or abscess without bleeding CPT copyright 2022 American Medical Association. All rights reserved. The codes documented in this report are preliminary and upon coder review may  be revised to meet current compliance requirements. Carlin POUR. Cindie, DO Carlin POUR. Ilia Engelbert, DO 07/23/2024 10:13:19 AM This report has been signed electronically. Number of Addenda: 0

## 2024-07-23 NOTE — Anesthesia Postprocedure Evaluation (Signed)
 Anesthesia Post Note  Patient: [name]  Procedure(s) Performed: COLONOSCOPY EGD (ESOPHAGOGASTRODUODENOSCOPY)  Patient location during evaluation: Phase II Anesthesia Type: General Level of consciousness: awake and alert Pain management: pain level controlled Vital Signs Assessment: post-procedure vital signs reviewed and stable Respiratory status: spontaneous breathing, nonlabored ventilation and respiratory function stable Cardiovascular status: stable Anesthetic complications: no   There were no known notable events for this encounter.   Last Vitals:  Vitals:   07/23/24 1013 07/23/24 1021  BP: (!) 105/52 (!) 112/59  Pulse: 65 61  Resp:  17  Temp: 36.4 C   SpO2: 99% 99%    Last Pain:  Vitals:   07/23/24 1021  TempSrc:   PainSc: 0-No pain                 Samaiyah Howes L Kiyan Burmester

## 2024-07-23 NOTE — Interval H&P Note (Signed)
 History and Physical Interval Note:  07/23/2024 9:13 AM  [name]  has presented today for surgery, with the diagnosis of screen colonoscopy, history of esophageal strictures, history of silent reflux, h/o gastric ulcers.  The various methods of treatment have been discussed with the patient and family. After consideration of risks, benefits and other options for treatment, the patient has consented to  Procedure(s) with comments: COLONOSCOPY (N/A) - 945am, asa 3 EGD (ESOPHAGOGASTRODUODENOSCOPY) (N/A) DILATION, ESOPHAGUS (N/A) as a surgical intervention.  The patient's history has been reviewed, patient examined, no change in status, stable for surgery.  I have reviewed the patient's chart and labs.  Questions were answered to the patient's satisfaction.     [name]

## 2024-07-23 NOTE — Anesthesia Preprocedure Evaluation (Addendum)
 Anesthesia Evaluation  Patient identified by MRN, date of birth, ID band Patient awake    Reviewed: Allergy & Precautions, NPO status , Patient's Chart, lab work & pertinent test results  Airway Mallampati: I  TM Distance: <3 FB Neck ROM: Full    Dental no notable dental hx. (+) Teeth Intact, Dental Advisory Given, Caps Veneer left upper front incisor:   Pulmonary sleep apnea    breath sounds clear to auscultation       Cardiovascular hypertension, Pt. on medications Normal cardiovascular exam Rhythm:Regular Rate:Normal     Neuro/Psych  Neuromuscular disease    GI/Hepatic Neg liver ROS,GERD  Medicated,,  Endo/Other  diabetes, Poorly Controlled, Type 2, Oral Hypoglycemic AgentsHypothyroidism    Renal/GU Renal disease     Musculoskeletal   Abdominal   Peds  Hematology   Anesthesia Other Findings Myasthenia gravis  Reproductive/Obstetrics                              Anesthesia Physical Anesthesia Plan  ASA: 3  Anesthesia Plan: General   Post-op Pain Management: Minimal or no pain anticipated   Induction: Intravenous  PONV Risk Score and Plan: Propofol  infusion  Airway Management Planned: Nasal Cannula and Natural Airway  Additional Equipment: None  Intra-op Plan:   Post-operative Plan:   Informed Consent: I have reviewed the patients History and Physical, chart, labs and discussed the procedure including the risks, benefits and alternatives for the proposed anesthesia with the patient or authorized representative who has indicated his/her understanding and acceptance.     Dental advisory given  Plan Discussed with: CRNA  Anesthesia Plan Comments:         Anesthesia Quick Evaluation

## 2024-07-23 NOTE — Op Note (Signed)
 [facility] Patient Name: [name] Procedure Date: 07/23/2024 9:36 AM MRN: [id number] Date of Birth: September 03, 1951 Attending MD: Carlin POUR. Cindie , [place], [id number] CSN: [id number] Age: 73 Admit Type: Outpatient Procedure:                Upper GI endoscopy Indications:              Screening for Barrett's esophagus in patient at                            risk for this condition Providers:                Carlin POUR. Cindie, DO, Devere Lodge, Jon Loge Referring MD:              Medicines:                See the Anesthesia note for documentation of the                            administered medications Complications:            No immediate complications. Estimated Blood Loss:     Estimated blood loss was minimal. Procedure:                Pre-Anesthesia Assessment:                           - The anesthesia plan was to use monitored                            anesthesia care (MAC).                           After obtaining informed consent, the endoscope was                            passed under direct vision. Throughout the                            procedure, the patient's blood pressure, pulse, and                            oxygen saturations were monitored continuously. The                            [id number]-[id number] ([id number]) Upper was introduced through                            the mouth, and advanced to the second part of                            duodenum. The upper GI endoscopy was accomplished                            without difficulty. The patient tolerated the  procedure well. Scope In: 9:46:06 AM Scope Out: 9:51:36 AM Total Procedure Duration: 0 hours 5 minutes 30 seconds  Findings:      White nummular lesions were noted in the middle third of the esophagus.       Cells for cytology were obtained by brushing.      A small hiatal hernia was present.      There were esophageal mucosal changes suspicious for short-segment        Barrett's esophagus present at the gastroesophageal junction. The       maximum longitudinal extent of these mucosal changes was 1 cm in length.       Mucosa was biopsied with a cold forceps for histology. One specimen       bottle was sent to pathology.      Diffuse mild inflammation characterized by erythema was found in the       entire examined stomach. Biopsies were taken with a cold forceps for       Helicobacter pylori testing.      The duodenal bulb, first portion of the duodenum and second portion of       the duodenum were normal. Impression:               - White nummular lesions in esophageal mucosa.                            Cells for cytology obtained.                           - Small hiatal hernia.                           - Esophageal mucosal changes suspicious for                            short-segment Barrett's esophagus. Biopsied.                           - Gastritis. Biopsied.                           - Normal duodenal bulb, first portion of the                            duodenum and second portion of the duodenum. Moderate Sedation:      Per Anesthesia Care Recommendation:           - Patient has a contact number available for                            emergencies. The signs and symptoms of potential                            delayed complications were discussed with the                            patient. Return to normal activities tomorrow.  Written discharge instructions were provided to the                            patient.                           - Resume previous diet.                           - Continue present medications.                           - Await pathology results.                           - Repeat upper endoscopy in 5 years for                            surveillance based on pathology results.                           - Return to GI clinic in 3 months.                           - Use a proton pump  inhibitor PO BID.                           - Treat for candidal esophagitis if cytology                            positive Procedure Code(s):        --- Professional ---                           [phone], Esophagogastroduodenoscopy, flexible,                            transoral; with biopsy, single or multiple Diagnosis Code(s):        --- Professional ---                           K22.89, Other specified disease of esophagus                           K44.9, Diaphragmatic hernia without obstruction or                            gangrene                           K29.70, Gastritis, unspecified, without bleeding                           Z13.810, Encounter for screening for upper                            gastrointestinal disorder CPT copyright 2022 American Medical Association. All rights reserved.  The codes documented in this report are preliminary and upon coder review may  be revised to meet current compliance requirements. Carlin POUR. Cindie, DO Carlin POUR. Cindie, DO 07/23/2024 9:55:22 AM This report has been signed electronically. Number of Addenda: 0

## 2024-07-24 LAB — SURGICAL PATHOLOGY

## 2024-07-24 NOTE — Progress Notes (Signed)
 Pt reported he had no issues, doing well and back at work today.

## 2024-07-28 ENCOUNTER — Encounter (HOSPITAL_COMMUNITY): Payer: Self-pay | Admitting: Internal Medicine

## 2024-07-31 ENCOUNTER — Ambulatory Visit: Payer: Self-pay | Admitting: Internal Medicine

## 2024-07-31 MED ORDER — FLUCONAZOLE 200 MG PO TABS: ORAL_TABLET | ORAL | 0 refills | Status: DC

## 2024-08-11 ENCOUNTER — Ambulatory Visit (INDEPENDENT_AMBULATORY_CARE_PROVIDER_SITE_OTHER): Admitting: Otolaryngology

## 2024-08-18 ENCOUNTER — Telehealth: Payer: Self-pay

## 2024-08-18 ENCOUNTER — Ambulatory Visit

## 2024-08-18 DIAGNOSIS — R300 Dysuria: Secondary | ICD-10-CM | POA: Diagnosis not present

## 2024-08-18 DIAGNOSIS — N401 Enlarged prostate with lower urinary tract symptoms: Secondary | ICD-10-CM

## 2024-08-18 DIAGNOSIS — R32 Unspecified urinary incontinence: Secondary | ICD-10-CM

## 2024-08-18 DIAGNOSIS — N138 Other obstructive and reflux uropathy: Secondary | ICD-10-CM | POA: Diagnosis not present

## 2024-08-18 LAB — MICROSCOPIC EXAMINATION

## 2024-08-18 LAB — URINALYSIS, ROUTINE W REFLEX MICROSCOPIC
Bilirubin, UA: NEGATIVE
Glucose, UA: NEGATIVE
Leukocytes,UA: NEGATIVE
Nitrite, UA: NEGATIVE
RBC, UA: NEGATIVE
Specific Gravity, UA: 1.025 (ref 1.005–1.030)
Urobilinogen, Ur: 4 mg/dL — ABNORMAL HIGH (ref 0.2–1.0)
pH, UA: 5.5 (ref 5.0–7.5)

## 2024-08-18 NOTE — Progress Notes (Signed)
 Patient presents today with complaints of  Burning, 7/10 pain, and incontinence.  UA and Culture done today.  Dr. Nieves reviewed results and  Awaiting Pending culture .  Patient aware of MD recommendations and that we will reach out with culture results.      Dyjwpvlj, CMA

## 2024-08-18 NOTE — Telephone Encounter (Signed)
   Urologic History:  Any Recent Urologic Surgeries or Procedures:no Recurrent UTI's:no Cystitis: no  Prostatitis:no Kidney or Bladder Stones: yes in the past 6 times Plan: Walk-in Clinic: no Appointment w/Physician: [no Lab visit scheduled for urine drop off: Yes Advice given:  Do you take on daily medications for UTI suppression No

## 2024-08-18 NOTE — Telephone Encounter (Signed)
 Dysuria  Patient called with c/o dysuria x 3-5 days days.  Pain: burning  Severity:7/10  Associated Signs and Symptoms:  Fever: yesTemp.102 Chills: yes Hematuria: no Urgency: no Frequency: no Hesitancy:no Incontinence: yes Nausea: no Vomiting: no  Please advise.

## 2024-08-20 ENCOUNTER — Telehealth: Payer: Self-pay

## 2024-08-20 DIAGNOSIS — N411 Chronic prostatitis: Secondary | ICD-10-CM

## 2024-08-20 DIAGNOSIS — N401 Enlarged prostate with lower urinary tract symptoms: Secondary | ICD-10-CM

## 2024-08-20 LAB — URINE CULTURE

## 2024-08-20 NOTE — Telephone Encounter (Signed)
 Pt wife called to get results was advised a message/ results would be sent to MD for his review

## 2024-08-21 NOTE — Telephone Encounter (Signed)
 Wife calling back about urine results. States he is pretty sick and needs something [phone]

## 2024-08-21 NOTE — Telephone Encounter (Signed)
 Return call to pt, making him aware his UA/culture was routed to MD, McKenzie for review and someone will reach out with MD recommendation. Verbalized understanding

## 2024-08-21 NOTE — Telephone Encounter (Signed)
 Pt called again today about UA/culture results. Pt state he had a fever of 102, pt was advised to go to the hospital or urgent care. Pt state both have long wait time to be seen. Pt is aware I will address his concerns with DR. McKenzie first thing tomorrow morning. Pt voiced understanding.

## 2024-08-26 MED ORDER — CEFPODOXIME PROXETIL 200 MG PO TABS: 200.0000 mg | ORAL_TABLET | Freq: Two times a day (BID) | ORAL | Status: DC

## 2024-08-26 NOTE — Telephone Encounter (Signed)
 Called pt back to relay MD orders. Verbalized understanding.

## 2024-08-26 NOTE — Addendum Note (Signed)
 Addended by: GRETTA MASTERS R on: 08/26/2024 03:33 PM   Modules accepted: Orders

## 2024-08-26 NOTE — Telephone Encounter (Signed)
 Per Dr. Sherrilee Please send vantin  200mg  BID for 14 days Pt is made aware and verbalized understanding.

## 2024-08-27 MED ORDER — CEFPODOXIME PROXETIL 200 MG PO TABS: 200.0000 mg | ORAL_TABLET | Freq: Two times a day (BID) | ORAL | 0 refills | Status: DC

## 2024-08-27 NOTE — Telephone Encounter (Signed)
 Patient left message that his RX was not received by pharmacy [facility]

## 2024-08-27 NOTE — Addendum Note (Signed)
 Addended by: SAMMIE EXIE HERO on: 08/27/2024 09:03 AM   Modules accepted: Orders

## 2024-08-27 NOTE — Telephone Encounter (Signed)
 Called pt to let him know Rx was corrected and sent to correct pharmacy

## 2024-09-02 ENCOUNTER — Encounter (INDEPENDENT_AMBULATORY_CARE_PROVIDER_SITE_OTHER): Payer: Self-pay | Admitting: Otolaryngology

## 2024-09-02 ENCOUNTER — Ambulatory Visit (INDEPENDENT_AMBULATORY_CARE_PROVIDER_SITE_OTHER): Admitting: Otolaryngology

## 2024-09-02 VITALS — BP 131/71 | HR 82 | Temp 98.0°F | Ht 73.0 in | Wt 206.0 lb

## 2024-09-02 DIAGNOSIS — H9319 Tinnitus, unspecified ear: Secondary | ICD-10-CM

## 2024-09-02 DIAGNOSIS — H6982 Other specified disorders of Eustachian tube, left ear: Secondary | ICD-10-CM

## 2024-09-02 DIAGNOSIS — H9042 Sensorineural hearing loss, unilateral, left ear, with unrestricted hearing on the contralateral side: Secondary | ICD-10-CM | POA: Diagnosis not present

## 2024-09-02 DIAGNOSIS — H903 Sensorineural hearing loss, bilateral: Secondary | ICD-10-CM

## 2024-09-02 DIAGNOSIS — H7291 Unspecified perforation of tympanic membrane, right ear: Secondary | ICD-10-CM | POA: Diagnosis not present

## 2024-09-02 DIAGNOSIS — H7201 Central perforation of tympanic membrane, right ear: Secondary | ICD-10-CM

## 2024-09-02 DIAGNOSIS — H6121 Impacted cerumen, right ear: Secondary | ICD-10-CM | POA: Diagnosis not present

## 2024-09-02 NOTE — Progress Notes (Signed)
 Patient ID: [name], male   DOB: [birth year], 73 y.o.   MRN: [id number]  Follow-up: Right tympanic membrane perforation, right ear deafness, left ear eustachian tube dysfunction  Discussed the use of AI scribe software for clinical note transcription with the patient, who gave verbal consent to proceed.  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 At his last visit in May 2025, he was noted to have left ear eustachian tube dysfunction and was treated with Flonase  nasal spray and Valsalva exercise.  He experiences tinnitus in his right ear, which has been getting louder and interferes with his normal hearing. He describes it as a 'ringing' noise created by his brain. He wears a hearing aid, which is helpful, but the tinnitus persists when he is awake.  He does not hear as well as he used to, especially in situations with background noise such as wind noise while driving or fan motors running at home.  No significant changes in hearing over the past few months. Reports increased tinnitus in the right ear.  Exam: General: Communicates without difficulty, well nourished, no acute distress. Head: Normocephalic, no evidence injury, no tenderness, facial buttresses intact without stepoff. Face/sinus: No tenderness to palpation and percussion. Facial movement is normal and symmetric. Eyes: PERRL, EOMI. No scleral icterus, conjunctivae clear. Neuro: CN II exam reveals vision grossly intact.  No nystagmus at any point of gaze. Ears: Auricles well formed without lesions.  Right ear cerumen impaction.  The left ear canal and tympanic membrane are normal.  Nose: External evaluation reveals normal support and skin without lesions.  Dorsum is intact.  Anterior rhinoscopy  reveals congested mucosa over anterior aspect of inferior turbinates and intact septum.  No purulence noted. Oral:  Oral cavity and oropharynx are intact, symmetric, without erythema or edema.  Mucosa is moist without lesions. Neck: Full range of motion without pain.  There is no significant lymphadenopathy.  No masses palpable.  Thyroid  bed within normal limits to palpation.  Parotid glands and submandibular glands equal bilaterally without mass.  Trachea is midline. Neuro:  CN 2-12 grossly intact.   Procedure: Right ear cerumen disimpaction Anesthesia: None Description: Under the operating microscope, the cerumen is carefully removed with a combination of cerumen currette, alligator forceps, and suction catheters.  After the cerumen is removed, a right TM perforation is noted.  The patient tolerated the procedure well.    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 Increasing tinnitus volume interferes with normal hearing, likely exacerbated by existing hearing loss. Wax buildup was cleaned during the visit. - Advise on the use of hearing aids to manage tinnitus and hearing loss. - Educate on potential need for hearing aid adjustment if current amplification is insufficient.  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 No significant changes in hearing since last evaluation. Reports difficulty hearing certain sounds, such as wind noise and fan motors, related to hearing loss. - Continue management with Flonase  nasal spray and Valsalva exercises. - Consider referral to audiologist for hearing aid adjustment if current settings are inadequate.  Right ear cerumen impaction. - Otomicroscopy with right  ear cerumen disimpaction. - The patient  will return for reevaluation in 6 months.

## 2024-09-04 ENCOUNTER — Ambulatory Visit (INDEPENDENT_AMBULATORY_CARE_PROVIDER_SITE_OTHER): Payer: Self-pay | Admitting: Audiology

## 2024-09-04 DIAGNOSIS — H903 Sensorineural hearing loss, bilateral: Secondary | ICD-10-CM

## 2024-09-04 NOTE — Progress Notes (Signed)
  [address] [place], [place] [address] [phone]  Hearing Aid Check     [name] comes for a scheduled appointment for a hearing aid check.  Accompanied ab:lwjrrnfejwpzi   Left  Hearing aid manufacturer [name] [facility] [id number]  Hearing aid style Receiver in the canal  Hearing aid battery   Receiver   Dome/ custom earpiece Vented large domes  Retention wire   Warranty expiration date 08-30-2022  Loss and Damage expired  Additional accessories Expiration date   Initial fitting date 06-17-2020  Device was fit at: [facility]  *No hearing in the right ear.*  Chief complaint: Patient reports is aware of his tinnitus a little more than before and that it affected what he can understand form people's speech.  Actions taken: Cleaned hearing aid. Reprogrammed using live speech mapping after switching dome from a medium open dome to a vented large dome (did not have power domes available) and running the feedback anger., The feedback manager looks better. LSM looks go after making gain changes to match NAL-NL2 settings.     Services fee: $30 was paid at checkout.     Recommend: Return for a hearing aid check , as needed. Return for a hearing evaluation and to see an ENT, if concerns with hearing changes arise.    [name] MARIE LEROUX-MARTINEZ, AUD

## 2024-09-10 ENCOUNTER — Telehealth (INDEPENDENT_AMBULATORY_CARE_PROVIDER_SITE_OTHER): Payer: Self-pay

## 2024-09-10 NOTE — Telephone Encounter (Signed)
 Pt called and LVM for you to call him back at your earliest convenience Pt did not state why.

## 2024-09-15 ENCOUNTER — Ambulatory Visit (INDEPENDENT_AMBULATORY_CARE_PROVIDER_SITE_OTHER): Payer: Self-pay | Admitting: Audiology

## 2024-09-15 DIAGNOSIS — H903 Sensorineural hearing loss, bilateral: Secondary | ICD-10-CM

## 2024-09-15 NOTE — Progress Notes (Signed)
  [address] [place], [place] [address] [phone]  Hearing Aid Check     [name] comes for a scheduled appointment for a hearing aid check.  Accompanied ab:lwjrrnfejwpzi     Left  Hearing aid manufacturer [name] [facility] [id number]  Hearing aid style Receiver in the canal  Hearing aid battery    Receiver    Dome/ custom earpiece Medium vented domes  Retention wire    Warranty expiration date 08-30-2022  Loss and Damage expired  Additional accessories Expiration date    Initial fitting date 06-17-2020  Device was fit at: Dr[facility] Roje[facility] clinic    Chief complaint: Patient reports the large vented domes make his ear feel sore. Patient reports that sounds have been perceived as more crisp since the previous visit.  Actions taken: Patient is aware that smaller earpieces may increase the risk of feedback. No changes were made to the device settings, and the feedback test was not re-run in order to preserve the current configuration.   Services fee: $0 was paid at checkout.   Recommend: Return for a hearing aid check , as needed. Return for a hearing evaluation and to see an ENT, if concerns with hearing changes arise.    Dino Borntreger MARIE LEROUX-MARTINEZ, AUD

## 2024-09-19 ENCOUNTER — Other Ambulatory Visit: Payer: Self-pay

## 2024-09-19 ENCOUNTER — Encounter (HOSPITAL_COMMUNITY): Payer: Self-pay | Admitting: Emergency Medicine

## 2024-09-19 ENCOUNTER — Emergency Department (HOSPITAL_COMMUNITY)
Admission: EM | Admit: 2024-09-19 | Discharge: 2024-09-19 | Disposition: A | Discharge status: Home / Self Care | Attending: Emergency Medicine | Admitting: Emergency Medicine

## 2024-09-19 ENCOUNTER — Emergency Department (HOSPITAL_COMMUNITY)

## 2024-09-19 DIAGNOSIS — Z7984 Long term (current) use of oral hypoglycemic drugs: Secondary | ICD-10-CM | POA: Insufficient documentation

## 2024-09-19 DIAGNOSIS — Z79899 Other long term (current) drug therapy: Secondary | ICD-10-CM | POA: Insufficient documentation

## 2024-09-19 DIAGNOSIS — I10 Essential (primary) hypertension: Secondary | ICD-10-CM | POA: Insufficient documentation

## 2024-09-19 DIAGNOSIS — E039 Hypothyroidism, unspecified: Secondary | ICD-10-CM | POA: Diagnosis not present

## 2024-09-19 DIAGNOSIS — R509 Fever, unspecified: Secondary | ICD-10-CM | POA: Insufficient documentation

## 2024-09-19 DIAGNOSIS — R112 Nausea with vomiting, unspecified: Secondary | ICD-10-CM | POA: Insufficient documentation

## 2024-09-19 DIAGNOSIS — R059 Cough, unspecified: Secondary | ICD-10-CM | POA: Diagnosis not present

## 2024-09-19 DIAGNOSIS — R110 Nausea: Secondary | ICD-10-CM

## 2024-09-19 LAB — LACTIC ACID, PLASMA: Lactic Acid, Venous: 1.7 mmol/L (ref 0.5–1.9)

## 2024-09-19 LAB — CBC WITH DIFFERENTIAL/PLATELET
Abs Immature Granulocytes: 0.06 K/uL (ref 0.00–0.07)
Basophils Absolute: 0 K/uL (ref 0.0–0.1)
Basophils Relative: 1 %
Eosinophils Absolute: 0.1 K/uL (ref 0.0–0.5)
Eosinophils Relative: 1 %
HCT: 39.5 % (ref 39.0–52.0)
Hemoglobin: 13.6 g/dL (ref 13.0–17.0)
Immature Granulocytes: 1 %
Lymphocytes Relative: 8 %
Lymphs Abs: 0.5 K/uL — ABNORMAL LOW (ref 0.7–4.0)
MCH: 33.2 pg (ref 26.0–34.0)
MCHC: 34.4 g/dL (ref 30.0–36.0)
MCV: 96.3 fL (ref 80.0–100.0)
Monocytes Absolute: 0.9 K/uL (ref 0.1–1.0)
Monocytes Relative: 16 %
Neutro Abs: 4.3 K/uL (ref 1.7–7.7)
Neutrophils Relative %: 73 %
Platelets: 137 K/uL — ABNORMAL LOW (ref 150–400)
RBC: 4.1 MIL/uL — ABNORMAL LOW (ref 4.22–5.81)
RDW: 14.8 % (ref 11.5–15.5)
WBC: 5.9 K/uL (ref 4.0–10.5)
nRBC: 0 % (ref 0.0–0.2)

## 2024-09-19 LAB — URINALYSIS, W/ REFLEX TO CULTURE (INFECTION SUSPECTED)
Bacteria, UA: NONE SEEN
Bilirubin Urine: NEGATIVE
Glucose, UA: NEGATIVE mg/dL
Hgb urine dipstick: NEGATIVE
Ketones, ur: NEGATIVE mg/dL
Leukocytes,Ua: NEGATIVE
Nitrite: NEGATIVE
Protein, ur: 100 mg/dL — AB
Specific Gravity, Urine: 1.016 (ref 1.005–1.030)
pH: 5 (ref 5.0–8.0)

## 2024-09-19 LAB — COMPREHENSIVE METABOLIC PANEL WITH GFR
ALT: 21 U/L (ref 0–44)
AST: 23 U/L (ref 15–41)
Albumin: 4.4 g/dL (ref 3.5–5.0)
Alkaline Phosphatase: 66 U/L (ref 38–126)
Anion gap: 13 (ref 5–15)
BUN: 11 mg/dL (ref 8–23)
CO2: 24 mmol/L (ref 22–32)
Calcium: 9 mg/dL (ref 8.9–10.3)
Chloride: 101 mmol/L (ref 98–111)
Creatinine, Ser: 1.08 mg/dL (ref 0.61–1.24)
GFR, Estimated: >60 mL/min (ref >60)
Glucose, Bld: 205 mg/dL — ABNORMAL HIGH (ref 70–99)
Potassium: 3.9 mmol/L (ref 3.5–5.1)
Sodium: 138 mmol/L (ref 135–145)
Total Bilirubin: 0.9 mg/dL (ref 0.0–1.2)
Total Protein: 6.8 g/dL (ref 6.5–8.1)

## 2024-09-19 LAB — LIPASE, BLOOD: Lipase: 48 U/L (ref 11–51)

## 2024-09-19 LAB — RESP PANEL BY RT-PCR (RSV, FLU A&B, COVID)  RVPGX2
Influenza A by PCR: NEGATIVE
Influenza B by PCR: NEGATIVE
Resp Syncytial Virus by PCR: NEGATIVE
SARS Coronavirus 2 by RT PCR: NEGATIVE

## 2024-09-19 LAB — TROPONIN T, HIGH SENSITIVITY: Troponin T High Sensitivity: <15 ng/L (ref 0–19)

## 2024-09-19 MED ORDER — FLUCONAZOLE 200 MG PO TABS: ORAL_TABLET | ORAL | 0 refills | Status: DC

## 2024-09-19 NOTE — Discharge Instructions (Signed)
 Your workup was overall reassuring today.  Urine does not show infection.  There have been some culture sent that you will be notified if they come back positive.  However I would start back on the Diflucan .  Call GI about follow-up.

## 2024-09-19 NOTE — ED Provider Notes (Signed)
 [place] AT [facility] Provider Note   CSN: [id number] Arrival date & time: 09/19/24  9244     Patient presents with: Gastroesophageal Reflux   [name] is a 73 y.o. male.    Gastroesophageal Reflux  Patient presents with vomiting.  Vomited last night again today.  States that soon as he ate the food it came back up.  No diarrhea.  Has a history of esophageal stricture but states this feels different.  Also recently treated within the last month and a half for candidal esophagitis.  States has had a little bit of a cough.  Had recently been treated for UTI a couple months ago.  States temperatures up to 99 at home.  States his temperature runs low so 99 is a fever for him.  He is on some immunosuppression for his myasthenia gravis.  Not having any urinary symptoms but states with the previous UTI did not have symptoms either.    Past Medical History:  Diagnosis Date   Candida esophagitis (HCC) 08/2013   Deafness in right ear    Diabetes mellitus without complication (HCC)    Difficult intubation    Esophageal stricture 07/2010   FOOD IMPACTION-->SAV DIL 16 MM   GERD (gastroesophageal reflux disease)    History of kidney stones    Hypertension    Hypothyroidism    Kidney stone    Myasthenia gravis (HCC)    Sleep apnea    No CPAP   Thyroid  disease     Prior to Admission medications   Medication Sig Start Date End Date Taking? Authorizing Provider  acyclovir  (ZOVIRAX ) 400 MG tablet Take 800 mg by mouth daily.    [provider]  Ascorbic Acid  (VITAMIN C PO) Take 4,000 mg by mouth daily.    [provider]  azaTHIOprine  (IMURAN ) 50 MG tablet Take 150 mg by mouth daily.     [provider]  Calcium  Carbonate-Vit D-Min (CALCIUM  600+D3 PLUS MINERALS) 600-800 MG-UNIT TABS Take 2 tablets by mouth daily.    [provider]  cefpodoxime  (VANTIN ) 200 MG tablet Take 1 tablet (200 mg total) by mouth 2 (two)  times daily. 08/27/24   McKenzie, Belvie CROME, MD  fluconazole  (DIFLUCAN ) 200 MG tablet Take 2 tablets on day 1 then 1 tablet daily thereafter for a total of 14 days. 09/19/24   Patsey Lot, MD  fluticasone  (FLONASE ) 50 MCG/ACT nasal spray Place 2 sprays into both nostrils daily. 08/02/22   Cobb, Comer GAILS, NP  glimepiride (AMARYL) 1 MG tablet Take 2 mg by mouth 2 (two) times daily.    [provider]  ibuprofen (ADVIL) 200 MG tablet Take 400 mg by mouth daily as needed for moderate pain.    [provider]  losartan  (COZAAR ) 50 MG tablet TAKE (1) TABLET BY MOUTH ONCE DAILY. 01/25/24   Cindie Ole DASEN, MD  metFORMIN (GLUCOPHAGE-XR) 500 MG 24 hr tablet Take 1,000 mg by mouth 2 (two) times daily.    [provider]  Omega-3 Fatty Acids (FISH OIL ULTRA) 1400 MG CAPS Take 1,400 mg by mouth daily after lunch.    [provider]  omeprazole  (PRILOSEC) 20 MG capsule TAKE (1) CAPSULE BY MOUTH TWICE DAILY. TAKE 1 CAPSULE 30 MINUTES BEFORE BREAKFAST, MAY TAKE ADDITIONAL ONE BEFORE EVENING MEAL IF NEEDED. 08/27/23   Rourk, Lamar HERO, MD  [facility] VERIO test strip 1 each 3 (three) times daily. 08/05/20   [provider]  Saw Palmetto 450  MG CAPS Take 2,250 mg by mouth daily after lunch.    [provider]  SYNTHROID  137 MCG tablet Take 137 mcg by mouth daily before breakfast. 07/03/13   [provider]  tadalafil  (CIALIS ) 20 MG tablet Take 1 tablet (20 mg total) by mouth as needed. 06/20/24   McKenzie, Belvie CROME, MD  tamsulosin  (FLOMAX ) 0.4 MG CAPS capsule Take 1 capsule (0.4 mg total) by mouth 2 (two) times daily. 04/02/24   McKenzie, Belvie CROME, MD  vitamin E  180 MG (400 UNITS) capsule Take 800 Units by mouth daily after lunch.    [provider]    Allergies: Levofloxacin, Tequin [gatifloxacin], Iodinated contrast media, Levothyroxine  sodium, Limonene, Magnesium-containing compounds, Aminoglycosides, Avelox [moxifloxacin hcl in nacl], Botox  [onabotulinumtoxina], Botulinum toxins, Calcium  channel blockers, Ciprofloxacin , Curare [tubocurarine], Dysport [abobotulinumtoxina], Erythromycin, Factive [gemifloxacin], Floxin [ofloxacin], Gentamycin [gentamicin], Interferons, Kanamycin, Ketek [telithromycin], Levaquin [levofloxacin in d5w], Levofloxacin, Macrolides and ketolides, Myobloc [rimabotulinumtoxinb], Neomycin, Noroxin [norfloxacin], Other, Penicillamine, Procainamide, Propranolol, Quinidine, Quinine derivatives, Streptomycin, Timolol, Tobramycin, Xeomin [incobotulinumtoxina], and Zithromax [azithromycin]    Review of Systems  Updated Vital Signs BP (!) 151/84   Pulse 74   Temp 99.1 F (37.3 C) (Oral)   Resp 19   SpO2 91%   Physical Exam Vitals and nursing note reviewed.  Cardiovascular:     Rate and Rhythm: Normal rate.  Chest:     Chest wall: No tenderness.  Abdominal:     Tenderness: There is no abdominal tenderness.  Musculoskeletal:     Right lower leg: No edema.     Left lower leg: No edema.  Skin:    Capillary Refill: Capillary refill takes less than 2 seconds.  Neurological:     Mental Status: He is alert.     (all labs ordered are listed, but only abnormal results are displayed) Labs Reviewed  COMPREHENSIVE METABOLIC PANEL WITH GFR - Abnormal; Notable for the following components:      Result Value   Glucose, Bld 205 (*)    All other components within normal limits  CBC WITH DIFFERENTIAL/PLATELET - Abnormal; Notable for the following components:   RBC 4.10 (*)    Platelets 137 (*)    Lymphs Abs 0.5 (*)    All other components within normal limits  URINALYSIS, W/ REFLEX TO CULTURE (INFECTION SUSPECTED) - Abnormal; Notable for the following components:   Protein, ur 100 (*)    All other components within normal limits  RESP PANEL BY RT-PCR (RSV, FLU A&B, COVID)  [id number]  CULTURE, BLOOD (ROUTINE X 2)  CULTURE, BLOOD (ROUTINE X 2)  LIPASE, BLOOD  LACTIC ACID, PLASMA  TROPONIN T, HIGH SENSITIVITY     EKG: EKG Interpretation Date/Time:  Friday September 19 2024 08:14:46 EDT Ventricular Rate:  88 PR Interval:  166 QRS Duration:  90 QT Interval:  352 QTC Calculation: 426 R Axis:   54  Text Interpretation: Sinus rhythm Borderline low voltage, extremity leads Nonspecific T abnormalities, lateral leads Confirmed by Patsey Lot [phone]) on 09/19/2024 9:43:09 AM  Radiology: [facility] Chest 2 View Result Date: 09/19/2024 EXAM: 2 VIEW(S) XRAY OF THE CHEST 09/19/2024 08:53:00 AM COMPARISON: 08/02/2022 CLINICAL HISTORY: chest pain chest pain FINDINGS: LUNGS AND PLEURA: Probable minimal bibasilar scarring. No focal pulmonary opacity. No pulmonary edema. No pleural effusion. No pneumothorax. HEART AND MEDIASTINUM: No acute abnormality of the cardiac and mediastinal silhouettes. BONES AND SOFT TISSUES: Sternotomy wires are noted stable. No acute osseous abnormality. IMPRESSION: 1. No acute cardiopulmonary process. 2. Stable minimal bibasilar scarring.  Electronically signed by: Lynwood Seip MD 09/19/2024 09:31 AM EDT RP Workstation: [id number]     Procedures   Medications Ordered in the ED - No data to display                                  Medical Decision Making Amount and/or Complexity of Data Reviewed Labs: ordered. Radiology: ordered.  Risk Prescription drug management.   Patient with vomiting.  Reported fevers at home.  Differential diagnosis does include causes such as gastroenteritis, however no diarrhea.  Benign abdominal exam.  Reviewing previous GI notes did have recent candidal esophagitis.  Potentially could be a cause to particulate since he been on antibiotics for UTI.  However UTI/prostatitis also considered.  With immunosuppression will get more blood work.  Will get chest x-ray.  Chest x-ray reassuring.  UTI reassuring.  Able to ambulate without hypoxia.  Feeling better.  Will discharge with outpatient follow-up.     Final diagnoses:  Nausea    ED Discharge  Orders          Ordered    fluconazole  (DIFLUCAN ) 200 MG tablet        09/19/24 1012               Patsey Lot, MD 09/19/24 1453

## 2024-09-19 NOTE — ED Notes (Signed)
 Pt ambulatory to waiting room. Pt verbalized understanding of discharge instructions.

## 2024-09-19 NOTE — ED Triage Notes (Signed)
 Pt reports when he eats food he has indigestion and is unable to eat. Pt did vomit 1 time today. Pt also reports fever that started on Wednesday.

## 2024-09-19 NOTE — ED Notes (Signed)
 Pt ambulated in the hallway without assistance. Pt did two labs around the nursing station. O2 stats drop from 97-93%. Pt reports no SHOB and appears fine and calm at this time.

## 2024-09-24 LAB — CULTURE, BLOOD (ROUTINE X 2)
Culture: NO GROWTH
Culture: NO GROWTH
Special Requests: ADEQUATE
Special Requests: ADEQUATE

## 2024-10-14 ENCOUNTER — Encounter: Payer: Self-pay | Admitting: Gastroenterology

## 2024-10-21 ENCOUNTER — Other Ambulatory Visit

## 2024-10-21 ENCOUNTER — Other Ambulatory Visit: Payer: Self-pay | Admitting: Urology

## 2024-10-21 ENCOUNTER — Telehealth: Payer: Self-pay

## 2024-10-21 ENCOUNTER — Other Ambulatory Visit: Payer: Self-pay

## 2024-10-21 DIAGNOSIS — R300 Dysuria: Secondary | ICD-10-CM

## 2024-10-21 DIAGNOSIS — N411 Chronic prostatitis: Secondary | ICD-10-CM

## 2024-10-21 LAB — URINALYSIS, ROUTINE W REFLEX MICROSCOPIC
Bilirubin, UA: NEGATIVE
Glucose, UA: NEGATIVE
Nitrite, UA: NEGATIVE
Specific Gravity, UA: 1.02 (ref 1.005–1.030)
Urobilinogen, Ur: 1 mg/dL (ref 0.2–1.0)
pH, UA: 6 (ref 5.0–7.5)

## 2024-10-21 LAB — MICROSCOPIC EXAMINATION
RBC, Urine: >30 /HPF — AB (ref 0–2)
WBC, UA: >30 /HPF — AB (ref 0–5)

## 2024-10-21 MED ORDER — AMOXICILLIN 875 MG PO TABS: 875.0000 mg | ORAL_TABLET | Freq: Two times a day (BID) | ORAL | 0 refills | Status: DC

## 2024-10-21 NOTE — Telephone Encounter (Signed)
-----   Message from Garnette HERO Dahlstedt sent at 10/21/2024  2:14 PM EST ----- I received a urinalysis on this man.  It looks infected.  I see that it has been cultured.  Tell him I sent antibiotic in.

## 2024-10-21 NOTE — Telephone Encounter (Signed)
 Called pt to given him UA results er MD Dahlstedt pt voiced his understanding

## 2024-10-21 NOTE — Telephone Encounter (Signed)
 Dysuria  Patient called with c/o dysuria x 24 hours days.  Pain: constant  Severity:7/10  Associated Signs and Symptoms:  Fever: yesTemp.Pt did check temp Chills: yes Hematuria: no Urgency: yes Frequency: yes Hesitancy:no Incontinence: yes Nausea: no Vomiting: no  Urologic History:  Any Recent Urologic Surgeries or Procedures:no Recurrent UTI's:yes Cystitis: no  Prostatitis:no Kidney or Bladder Stones: yes (6) Plan: Walk-in Clinic: no Appointment w/Physician: [no Lab visit scheduled for urine drop off: Yes Advice given:  Do you take on daily medications for UTI suppression No

## 2024-10-24 LAB — URINE CULTURE

## 2024-10-26 ENCOUNTER — Ambulatory Visit: Payer: Self-pay | Admitting: Urology

## 2024-10-29 NOTE — Telephone Encounter (Signed)
 Please advise pt

## 2024-11-04 ENCOUNTER — Telehealth: Payer: Self-pay

## 2024-11-04 NOTE — Telephone Encounter (Signed)
 Next available okay or nurse visit ?

## 2024-11-04 NOTE — Telephone Encounter (Signed)
 Called pt to schedule PVR per MD McKenzie recommendation, pt states he went to see his PCP and was advised his PSA levels are elevated pt believes due to the elevated Psa that is the cause of his urinary symptoms pt would like for Md McKenzie to review. His labs before having a PVR completed pt was advised to have labs either faxed over or send them via mychart pt voiced his understanding and will send over labs

## 2024-11-04 NOTE — Telephone Encounter (Signed)
 Pt called to let us  know that MD Marvine has forwarded his PSA results pt was advised they will be routed to MD McKenzie pt voiced his understanding

## 2024-11-07 ENCOUNTER — Other Ambulatory Visit

## 2024-11-10 ENCOUNTER — Telehealth: Payer: Self-pay | Admitting: Urology

## 2024-11-10 NOTE — Telephone Encounter (Signed)
 Called patient and he state's he woke up in pain and started taking cefeodixime which is not helping. Pt is aware a message will be sent to MD. Voiced understanding

## 2024-11-10 NOTE — Telephone Encounter (Signed)
 Left message prostate problem is worse and needs to be seen today [phone]

## 2024-11-11 ENCOUNTER — Telehealth: Payer: Self-pay

## 2024-11-11 DIAGNOSIS — N411 Chronic prostatitis: Secondary | ICD-10-CM

## 2024-11-11 MED ORDER — DOXYCYCLINE HYCLATE 100 MG PO CAPS: 100.0000 mg | ORAL_CAPSULE | Freq: Two times a day (BID) | ORAL | 0 refills | Status: DC

## 2024-11-11 NOTE — Addendum Note (Signed)
 Addended by: SAMMIE EXIE HERO on: 11/11/2024 09:52 AM   Modules accepted: Orders

## 2024-11-11 NOTE — Telephone Encounter (Signed)
 Per verbal from MD McKenzie,  have pt try doxycycline  100mg  BID for 28 days. Rx sent to confirmed pharmacy pt made aware

## 2024-11-11 NOTE — Telephone Encounter (Signed)
 Pt called stating he is having pain in prostate region he has been taking some leftover abx (vantin ) which seems to be helping pt states this issue needs to be addressed because he does not have any left pt advised a message would be sent to MD regarding his prostatitis and MD will possibly proscribe something until his ISO PSA results return pt voiced his understanding

## 2024-11-17 ENCOUNTER — Other Ambulatory Visit: Payer: Self-pay | Admitting: Internal Medicine

## 2024-11-17 DIAGNOSIS — N401 Enlarged prostate with lower urinary tract symptoms: Secondary | ICD-10-CM

## 2024-11-19 ENCOUNTER — Telehealth: Payer: Self-pay

## 2024-11-19 NOTE — Telephone Encounter (Signed)
 Patient called and made aware of normal Iso Psa results per Dr. Sherrilee. Patient voiced understanding.

## 2024-12-02 ENCOUNTER — Ambulatory Visit: Admitting: Gastroenterology

## 2024-12-02 DIAGNOSIS — B3781 Candidal esophagitis: Secondary | ICD-10-CM

## 2024-12-02 DIAGNOSIS — K219 Gastro-esophageal reflux disease without esophagitis: Secondary | ICD-10-CM

## 2024-12-02 MED ORDER — FLUCONAZOLE 200 MG PO TABS: ORAL_TABLET | ORAL | 0 refills | Status: AC

## 2024-12-02 NOTE — Progress Notes (Unsigned)
 "    GI Office Note    Referring Provider: Marvine Rush, MD Primary Care Physician:  Marvine Rush, MD  Primary Gastroenterologist: Carlin POUR. Cindie, DO   Chief Complaint   Chief Complaint  Patient presents with   Follow-up    Follow up on GERD. Pt is doing ok    History of Present Illness   [name] is a 74 y.o. male presenting today for follow up. Last seen in 06/2024. He has a history of myasthenia gravis diagnosed in 2002. He has had multiple esophageal strictures/food impactions requiring multiple EGDs with esophageal dilation, suspect silent reflux. He has history of nonbleeding gastric ulcer noted on EGD 2019 (no h.pylori).  He has a history of myasthenia gravis for nearly 25 years, which he describes as well-regulated but exacerbated by heat. Certain medications and treatments are contraindicated due to his condition, including magnesium-based compounds and contrast fluids for imaging.  See extensive list of other allergies/contraindications.    Since his last ov, he has completed EGD/colonoscopy as outlined below.  Discussed the use of AI scribe software for clinical note transcription with the patient, who gave verbal consent to proceed.  History of Present Illness   He has had two recent episodes of esophageal candidiasis. The first followed antibiotics for presumed UTI and was treated with a 10-day course of oral Diflucan  with symptom resolution. After a 22-day course of doxycycline  he developed acute dysphagia with food impaction on biscuit. Cardiac evaluation in the hospital was negative and he was started on a 14-day course of Diflucan  with loading dose. He later self-treated another similar episode around [place] with leftover Diflucan  and has been asymptomatic since. He currently notes only an intermittent sensation of something in his throat without heartburn.  He has silent reflux and esophageal stricture with last dilation in 2019. He takes nightly  omeprazole  and has not needed repeat dilation. He denies current heartburn or recent ENT or throat procedures.  He recently adopted a carnivore diet without sugar or carbohydrates and attributes improved bowel movements and weight loss to this, with current weight 200.2 lb. He takes daily azathioprine  for immunosuppression and is concerned about managing these issues during upcoming travel. He does not use inhalers. Recent hospital evaluation was described as ruling out cardiac disease in the context of his dysphagia episode.     Prior Data     Results   Colonoscopy 07/2024: - Nonbleeding internal hemorrhoids - Diverticulosis in the sigmoid colon, descending colon, transverse colon -two 5 to 7 mm polyps in the ascending colon and cecum removed, tubular adenoma, hyperplastic polyp - Further colonoscopy for polyp surveillance not advised due to age  EGD September 2025: -White nodular lesions of the esophageal mucosa, cells for cell Callergy - Small hiatal hernia - Esophageal mucosal changes suspicious for short-segment Barrett's esophagus status post biopsy, negative for Barrett's, consistent with reflux - Gastritis status post biopsy, reactive gastritis with no H. pylori - Normal duodenal bulb, first portion of duodenum and second portion duodenum   Medications   Current Outpatient Medications  Medication Sig Dispense Refill   acyclovir  (ZOVIRAX ) 400 MG tablet Take 800 mg by mouth daily.     Ascorbic Acid  (VITAMIN C PO) Take 4,000 mg by mouth daily.     azaTHIOprine  (IMURAN ) 50 MG tablet Take 150 mg by mouth daily.      Calcium  Carbonate-Vit D-Min (CALCIUM  600+D3 PLUS MINERALS) 600-800 MG-UNIT TABS Take 2 tablets by mouth daily.     fluticasone  (FLONASE )  50 MCG/ACT nasal spray Place 2 sprays into both nostrils daily. 18.2 mL 2   glimepiride (AMARYL) 1 MG tablet Take 2 mg by mouth 2 (two) times daily.     ibuprofen (ADVIL) 200 MG tablet Take 400 mg by mouth daily as needed for  moderate pain.     losartan  (COZAAR ) 50 MG tablet TAKE (1) TABLET BY MOUTH ONCE DAILY. 90 tablet 3   metFORMIN (GLUCOPHAGE-XR) 500 MG 24 hr tablet Take 1,000 mg by mouth 2 (two) times daily.     Omega-3 Fatty Acids (FISH OIL ULTRA) 1400 MG CAPS Take 1,400 mg by mouth daily after lunch.     omeprazole  (PRILOSEC) 20 MG capsule Take one capsule 30 minutes before breakfast, may take second dose before evening meal if needed. 180 capsule 3   ONETOUCH VERIO test strip 1 each 3 (three) times daily.     Saw Palmetto 450 MG CAPS Take 2,250 mg by mouth daily after lunch.     SYNTHROID  137 MCG tablet Take 137 mcg by mouth daily before breakfast.     tadalafil  (CIALIS ) 20 MG tablet Take 1 tablet (20 mg total) by mouth as needed. 30 tablet 5   tamsulosin  (FLOMAX ) 0.4 MG CAPS capsule Take 1 capsule (0.4 mg total) by mouth 2 (two) times daily. 180 capsule 3   vitamin E  180 MG (400 UNITS) capsule Take 800 Units by mouth daily after lunch.     No current facility-administered medications for this visit.    Allergies   Allergies as of 12/02/2024 - Review Complete 12/02/2024  Allergen Reaction Noted   Levofloxacin Anaphylaxis 03/01/2011   Tequin [gatifloxacin] Anaphylaxis 06/07/2012   Iodinated contrast media  02/07/2018   Levothyroxine  sodium Other (See Comments) 04/04/2021   Limonene Other (See Comments) and Rash 02/10/2015   Magnesium-containing compounds Rash and Other (See Comments) 04/01/2014   Aminoglycosides Other (See Comments) 06/07/2012   Avelox [moxifloxacin hcl in nacl] Other (See Comments) 06/07/2012   Botox [onabotulinumtoxina] Other (See Comments) 06/07/2012   Botulinum toxins Other (See Comments) 06/07/2012   Calcium  channel blockers Other (See Comments) 06/07/2012   Ciprofloxacin  Other (See Comments) 06/07/2012   Curare [tubocurarine] Other (See Comments) 06/07/2012   Dysport [abobotulinumtoxina] Other (See Comments) 06/07/2012   Erythromycin Other (See Comments) 06/07/2012   Factive  [gemifloxacin] Other (See Comments) 06/07/2012   Floxin [ofloxacin] Other (See Comments) 06/07/2012   Gentamycin [gentamicin] Other (See Comments) 06/07/2012   Interferons Other (See Comments) 06/07/2012   Kanamycin Other (See Comments) 06/07/2012   Ketek [telithromycin] Other (See Comments) 06/07/2012   Levaquin [levofloxacin in d5w] Other (See Comments) 06/07/2012   Levofloxacin Other (See Comments) 02/06/2022   Macrolides and ketolides Other (See Comments) 06/07/2012   Myobloc [rimabotulinumtoxinb] Other (See Comments) 06/07/2012   Neomycin Other (See Comments) 06/07/2012   Noroxin [norfloxacin] Other (See Comments) 06/07/2012   Other Other (See Comments) 06/07/2012   Penicillamine Other (See Comments) 06/07/2012   Procainamide Other (See Comments) 06/07/2012   Propranolol Other (See Comments) 06/07/2012   Quinidine Other (See Comments) 06/07/2012   Quinine derivatives Other (See Comments) 06/07/2012   Streptomycin Other (See Comments) 06/07/2012   Timolol Other (See Comments) 06/07/2012   Tobramycin Other (See Comments) 06/07/2012   Xeomin [incobotulinumtoxina] Other (See Comments) 06/07/2012   Zithromax [azithromycin] Other (See Comments) 06/07/2012     Past Medical History   Past Medical History:  Diagnosis Date   Candida esophagitis (HCC) 08/2013   Deafness in right ear    Diabetes mellitus without  complication (HCC)    Difficult intubation    Esophageal stricture 07/2010   FOOD IMPACTION-->SAV DIL 16 MM   GERD (gastroesophageal reflux disease)    History of kidney stones    Hypertension    Hypothyroidism    Kidney stone    Myasthenia gravis (HCC)    Sleep apnea    No CPAP   Thyroid  disease     Past Surgical History   Past Surgical History:  Procedure Laterality Date   ATRIAL FIBRILLATION ABLATION N/A 08/12/2020   Procedure: ATRIAL FIBRILLATION ABLATION;  Surgeon: Cindie Ole DASEN, MD;  Location: [facility];  Service: Cardiovascular;  Laterality:  N/A;   BIOPSY  02/14/2018   Procedure: BIOPSY;  Surgeon: Harvey Margo CROME, MD;  Location: [facility];  Service: Endoscopy;;  gastric   CARDIOVERSION N/A 06/08/2020   Procedure: CARDIOVERSION;  Surgeon: Alvan Dorn FALCON, MD;  Location: [facility];  Service: Endoscopy;  Laterality: N/A;   CARDIOVERSION N/A 09/16/2020   Procedure: CARDIOVERSION;  Surgeon: Hobart Powell BRAVO, MD;  Location: [facility] ENDOSCOPY;  Service: Cardiovascular;  Laterality: N/A;   CHOLECYSTECTOMY     COLONOSCOPY  2005   Dr. Mavis   COLONOSCOPY N/A 07/17/2014   Procedure: COLONOSCOPY;  Surgeon: Margo CROME Harvey, MD;  Location: [facility];  Service: Endoscopy;  Laterality: N/A;  9:30-rescheduled 8/28 same time Anette Caldron to notify pt   COLONOSCOPY N/A 07/23/2024   Procedure: COLONOSCOPY;  Surgeon: Cindie Carlin POUR, DO;  Location: [facility];  Service: Endoscopy;  Laterality: N/A;  945am, asa 3   ESOPHAGOGASTRODUODENOSCOPY N/A 07/25/2013   Shallow ulcer in the cardia, mid esophageal web, stricture at GE junction   ESOPHAGOGASTRODUODENOSCOPY N/A 02/14/2018   Procedure: ESOPHAGOGASTRODUODENOSCOPY (EGD);  Surgeon: Harvey Margo CROME, MD;  Location: [facility];  Service: Endoscopy;  Laterality: N/A;  11:15am   ESOPHAGOGASTRODUODENOSCOPY N/A 07/23/2024   Procedure: EGD (ESOPHAGOGASTRODUODENOSCOPY);  Surgeon: Cindie Carlin POUR, DO;  Location: [facility];  Service: Endoscopy;  Laterality: N/A;   ESOPHAGOGASTRODUODENOSCOPY (EGD) WITH ESOPHAGEAL DILATION N/A 08/29/2013   Candida esophagitis, small hiatal hernia, moderate nonerosive gastritis, mid esophageal web   HERNIA REPAIR     right inguinal   KIDNEY STONE SURGERY     KNEE ARTHROSCOPY Right 03/12/2023   Procedure: RIGHT KNEE ARTHROSCOPY, MEDIAL AND LATERAL MENISECTOMY;  Surgeon: Liam Lerner, MD;  Location: [facility];  Service: Orthopedics;  Laterality: Right;   NASAL SEPTUM SURGERY     PALATE SURGERY         SAVORY DILATION N/A 02/14/2018   Procedure: SAVORY DILATION;   Surgeon: Harvey Margo CROME, MD;  Location: [facility];  Service: Endoscopy;  Laterality: N/A;   TEE WITHOUT CARDIOVERSION N/A 08/10/2020   Procedure: TRANSESOPHAGEAL ECHOCARDIOGRAM (TEE);  Surgeon: Raford Riggs, MD;  Location: [facility] ENDOSCOPY;  Service: Cardiovascular;  Laterality: N/A;   THYMECTOMY     TONSILLECTOMY     UPPER GASTROINTESTINAL ENDOSCOPY  SEP 2011   SAV DIL    Past Family History   Family History  Problem Relation Age of Onset   Liver cancer Father        age 38, deceased   Colon cancer Neg Hx     Past Social History   Social History   Socioeconomic History   Marital status: Married    Spouse name: Not on file   Number of children: Not on file   Years of education: Not on file   Highest education level: Not on file  Occupational History   Not on file  Tobacco Use   Smoking status: Never   Smokeless tobacco: Never  Vaping Use   Vaping status: Never Used  Substance and Sexual Activity   Alcohol use: No   Drug use: No   Sexual activity: Yes    Partners: Female    Birth control/protection: None    Comment: spouse  Other Topics Concern   Not on file  Social History Narrative   Not on file   Social Drivers of Health   Tobacco Use: Low Risk (09/19/2024)   Patient History    Smoking Tobacco Use: Never    Smokeless Tobacco Use: Never    Passive Exposure: Not on file  Financial Resource Strain: Not on file  Food Insecurity: Not on file  Transportation Needs: Not on file  Physical Activity: Not on file  Stress: Not on file  Social Connections: Unknown (04/03/2022)   Received from [facility]   Social Network    Social Network: Not on file  Intimate Partner Violence: Unknown (02/23/2022)   Received from [facility]   HITS    Physically Hurt: Not on file    Insult or Talk Down To: Not on file    Threaten Physical Harm: Not on file    Scream or Curse: Not on file  Depression (EYV7-0): Not on file  Alcohol Screen: Not on file  Housing:  Unknown (10/13/2024)   Received from [facility]   Epic    Unable to Pay for Housing in the Last Year: Not on file    Number of Times Moved in the Last Year: Not on file    At any time in the past 12 months, were you homeless or living in a shelter (including now)?: No  Utilities: Not on file  Health Literacy: Not on file    Review of Systems   General: Negative for anorexia, weight loss, fever, chills, fatigue, weakness. ENT: Negative for hoarseness, difficulty swallowing , nasal congestion. CV: Negative for chest pain, angina, palpitations, dyspnea on exertion, peripheral edema.  Respiratory: Negative for dyspnea at rest, dyspnea on exertion, cough, sputum, wheezing.  GI: See history of present illness. GU:  Negative for dysuria, hematuria, urinary incontinence, urinary frequency, nocturnal urination.  Endo: Negative for unusual weight change.     Physical Exam   BP 128/72   Pulse 82   Temp 98.4 F (36.9 C)   Ht 6' 1 (1.854 m)   Wt 208 lb 9.6 oz (94.6 kg)   BMI 27.52 kg/m    General: Well-nourished, well-developed in no acute distress.  Eyes: No icterus. Mouth: Oropharyngeal mucosa moist and pink   Lungs: Clear to auscultation bilaterally.  Heart: Regular rate and rhythm, no murmurs rubs or gallops.  Abdomen: Bowel sounds are normal, nontender, nondistended, no hepatosplenomegaly or masses,  no abdominal bruits or hernia , no rebound or guarding.  Rectal: not performed Extremities: No lower extremity edema. No clubbing or deformities. Neuro: Alert and oriented x 4   Skin: Warm and dry, no jaundice.   Psych: Alert and cooperative, normal mood and affect.  Labs   *** Imaging Studies   No results found.  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 Elevated recurrence risk during travel. Previously tolerated  fluconazole . - Prescribed fluconazole  for symptomatic recurrence during travel. - Instructed to initiate fluconazole  if symptoms recur and notify upon return if  used. - Sent prescription to [facility].  Gastroesophageal reflux disease Chronic GERD, asymptomatic, managed with nightly omeprazole . - Continue nightly omeprazole .  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 - Reinforced education on infection risk due to azathioprine .  Recording duration: 15 minutes   ***no egd/t cs in 5 years        Sonny RAMAN. Ezzard, MHS, PA-C [facility]  "

## 2024-12-02 NOTE — Patient Instructions (Signed)
 Continue omeprazole  daily.   I have sent in RX for fluconazole  to have on hand in case you develop recurrent yeast infection in your esophagus. If you have to use it, please let me know.   Return to the office as needed.

## 2024-12-10 ENCOUNTER — Inpatient Hospital Stay (HOSPITAL_COMMUNITY)
Admission: EM | Admit: 2024-12-10 | Discharge: 2024-12-12 | DRG: 872 | Disposition: A | Discharge status: Home / Self Care | Attending: Hospitalist | Admitting: Hospitalist

## 2024-12-10 ENCOUNTER — Other Ambulatory Visit: Payer: Self-pay

## 2024-12-10 ENCOUNTER — Emergency Department (HOSPITAL_COMMUNITY)

## 2024-12-10 ENCOUNTER — Telehealth: Payer: Self-pay

## 2024-12-10 ENCOUNTER — Encounter (HOSPITAL_COMMUNITY): Payer: Self-pay | Admitting: *Deleted

## 2024-12-10 ENCOUNTER — Telehealth: Payer: Self-pay | Admitting: Gastroenterology

## 2024-12-10 DIAGNOSIS — E1165 Type 2 diabetes mellitus with hyperglycemia: Secondary | ICD-10-CM | POA: Diagnosis present

## 2024-12-10 DIAGNOSIS — N401 Enlarged prostate with lower urinary tract symptoms: Secondary | ICD-10-CM | POA: Diagnosis present

## 2024-12-10 DIAGNOSIS — B961 Klebsiella pneumoniae [K. pneumoniae] as the cause of diseases classified elsewhere: Secondary | ICD-10-CM | POA: Diagnosis present

## 2024-12-10 DIAGNOSIS — Z7901 Long term (current) use of anticoagulants: Secondary | ICD-10-CM

## 2024-12-10 DIAGNOSIS — Z7984 Long term (current) use of oral hypoglycemic drugs: Secondary | ICD-10-CM | POA: Diagnosis not present

## 2024-12-10 DIAGNOSIS — H9191 Unspecified hearing loss, right ear: Secondary | ICD-10-CM | POA: Diagnosis present

## 2024-12-10 DIAGNOSIS — K219 Gastro-esophageal reflux disease without esophagitis: Secondary | ICD-10-CM | POA: Diagnosis present

## 2024-12-10 DIAGNOSIS — Z79899 Other long term (current) drug therapy: Secondary | ICD-10-CM | POA: Diagnosis not present

## 2024-12-10 DIAGNOSIS — R000 Tachycardia, unspecified: Secondary | ICD-10-CM | POA: Diagnosis present

## 2024-12-10 DIAGNOSIS — N390 Urinary tract infection, site not specified: Secondary | ICD-10-CM | POA: Diagnosis present

## 2024-12-10 DIAGNOSIS — A4159 Other Gram-negative sepsis: Principal | ICD-10-CM | POA: Diagnosis present

## 2024-12-10 DIAGNOSIS — Z79890 Hormone replacement therapy (postmenopausal): Secondary | ICD-10-CM | POA: Diagnosis not present

## 2024-12-10 DIAGNOSIS — I10 Essential (primary) hypertension: Secondary | ICD-10-CM | POA: Diagnosis present

## 2024-12-10 DIAGNOSIS — N138 Other obstructive and reflux uropathy: Secondary | ICD-10-CM | POA: Diagnosis present

## 2024-12-10 DIAGNOSIS — Z881 Allergy status to other antibiotic agents status: Secondary | ICD-10-CM

## 2024-12-10 DIAGNOSIS — Z8679 Personal history of other diseases of the circulatory system: Secondary | ICD-10-CM

## 2024-12-10 DIAGNOSIS — Z87440 Personal history of urinary (tract) infections: Secondary | ICD-10-CM

## 2024-12-10 DIAGNOSIS — Z888 Allergy status to other drugs, medicaments and biological substances status: Secondary | ICD-10-CM | POA: Diagnosis not present

## 2024-12-10 DIAGNOSIS — Z91041 Radiographic dye allergy status: Secondary | ICD-10-CM | POA: Diagnosis not present

## 2024-12-10 DIAGNOSIS — R10A3 Flank pain, bilateral: Principal | ICD-10-CM

## 2024-12-10 DIAGNOSIS — N400 Enlarged prostate without lower urinary tract symptoms: Secondary | ICD-10-CM

## 2024-12-10 DIAGNOSIS — G7000 Myasthenia gravis without (acute) exacerbation: Secondary | ICD-10-CM | POA: Diagnosis present

## 2024-12-10 DIAGNOSIS — A419 Sepsis, unspecified organism: Secondary | ICD-10-CM | POA: Diagnosis not present

## 2024-12-10 DIAGNOSIS — I4819 Other persistent atrial fibrillation: Secondary | ICD-10-CM | POA: Diagnosis present

## 2024-12-10 DIAGNOSIS — E039 Hypothyroidism, unspecified: Secondary | ICD-10-CM | POA: Diagnosis present

## 2024-12-10 DIAGNOSIS — Z9049 Acquired absence of other specified parts of digestive tract: Secondary | ICD-10-CM | POA: Diagnosis not present

## 2024-12-10 LAB — CBC WITH DIFFERENTIAL/PLATELET
Abs Immature Granulocytes: 0.09 K/uL — ABNORMAL HIGH (ref 0.00–0.07)
Basophils Absolute: 0 K/uL (ref 0.0–0.1)
Basophils Relative: 0 %
Eosinophils Absolute: 0 K/uL (ref 0.0–0.5)
Eosinophils Relative: 0 %
HCT: 41.9 % (ref 39.0–52.0)
Hemoglobin: 14.5 g/dL (ref 13.0–17.0)
Immature Granulocytes: 1 %
Lymphocytes Relative: 4 %
Lymphs Abs: 0.3 K/uL — ABNORMAL LOW (ref 0.7–4.0)
MCH: 33 pg (ref 26.0–34.0)
MCHC: 34.6 g/dL (ref 30.0–36.0)
MCV: 95.4 fL (ref 80.0–100.0)
Monocytes Absolute: 0.3 K/uL (ref 0.1–1.0)
Monocytes Relative: 3 %
Neutro Abs: 7.6 K/uL (ref 1.7–7.7)
Neutrophils Relative %: 92 %
Platelets: 215 K/uL (ref 150–400)
RBC: 4.39 MIL/uL (ref 4.22–5.81)
RDW: 14.3 % (ref 11.5–15.5)
WBC: 8.4 K/uL (ref 4.0–10.5)
nRBC: 0 % (ref 0.0–0.2)

## 2024-12-10 LAB — URINALYSIS, ROUTINE W REFLEX MICROSCOPIC
Bilirubin Urine: NEGATIVE
Glucose, UA: NEGATIVE mg/dL
Hgb urine dipstick: NEGATIVE
Ketones, ur: NEGATIVE mg/dL
Leukocytes,Ua: NEGATIVE
Nitrite: POSITIVE — AB
Protein, ur: 30 mg/dL — AB
Specific Gravity, Urine: 1.016 (ref 1.005–1.030)
pH: 5 (ref 5.0–8.0)

## 2024-12-10 LAB — COMPREHENSIVE METABOLIC PANEL WITH GFR
ALT: 29 U/L (ref 0–44)
AST: 25 U/L (ref 15–41)
Albumin: 4.6 g/dL (ref 3.5–5.0)
Alkaline Phosphatase: 76 U/L (ref 38–126)
Anion gap: 18 — ABNORMAL HIGH (ref 5–15)
BUN: 26 mg/dL — ABNORMAL HIGH (ref 8–23)
CO2: 21 mmol/L — ABNORMAL LOW (ref 22–32)
Calcium: 9.9 mg/dL (ref 8.9–10.3)
Chloride: 103 mmol/L (ref 98–111)
Creatinine, Ser: 1.2 mg/dL (ref 0.61–1.24)
GFR, Estimated: >60 mL/min
Glucose, Bld: 112 mg/dL — ABNORMAL HIGH (ref 70–99)
Potassium: 4.2 mmol/L (ref 3.5–5.1)
Sodium: 142 mmol/L (ref 135–145)
Total Bilirubin: 0.6 mg/dL (ref 0.0–1.2)
Total Protein: 7.2 g/dL (ref 6.5–8.1)

## 2024-12-10 LAB — GLUCOSE, CAPILLARY: Glucose-Capillary: 122 mg/dL — ABNORMAL HIGH (ref 70–99)

## 2024-12-10 LAB — PROTIME-INR
INR: 0.9 (ref 0.8–1.2)
Prothrombin Time: 13 s (ref 11.4–15.2)

## 2024-12-10 LAB — LACTIC ACID, PLASMA: Lactic Acid, Venous: 1.4 mmol/L (ref 0.5–1.9)

## 2024-12-10 LAB — MAGNESIUM: Magnesium: 1 mg/dL — ABNORMAL LOW (ref 1.7–2.4)

## 2024-12-10 LAB — PHOSPHORUS: Phosphorus: 2.5 mg/dL (ref 2.5–4.6)

## 2024-12-10 MED ORDER — INSULIN ASPART 100 UNIT/ML IJ SOLN
0.0000 [IU] | Freq: Three times a day (TID) | INTRAMUSCULAR | Status: DC
  Administered 2024-12-11 (×2): 2 [IU] via SUBCUTANEOUS
  Administered 2024-12-12 (×2): 3 [IU] via SUBCUTANEOUS
  Filled 2024-12-10 (×4): qty 1

## 2024-12-10 MED ORDER — ACETAMINOPHEN 325 MG PO TABS: 650.0000 mg | ORAL_TABLET | Freq: Four times a day (QID) | ORAL | Status: DC | PRN

## 2024-12-10 MED ORDER — AZATHIOPRINE 50 MG PO TABS
150.0000 mg | ORAL_TABLET | Freq: Every day | ORAL | Status: DC
  Filled 2024-12-10 (×2): qty 3

## 2024-12-10 MED ORDER — SODIUM CHLORIDE 0.9 % IV SOLN
2.0000 g | Freq: Once | INTRAVENOUS | Status: AC
  Administered 2024-12-10: 2 g via INTRAVENOUS
  Filled 2024-12-10: qty 20

## 2024-12-10 MED ORDER — LACTATED RINGERS IV BOLUS
1000.0000 mL | Freq: Once | INTRAVENOUS | Status: AC
  Administered 2024-12-10: 1000 mL via INTRAVENOUS

## 2024-12-10 MED ORDER — PANTOPRAZOLE SODIUM 40 MG PO TBEC
40.0000 mg | DELAYED_RELEASE_TABLET | Freq: Every day | ORAL | Status: DC
  Administered 2024-12-11 – 2024-12-12 (×2): 40 mg via ORAL
  Filled 2024-12-10 (×2): qty 1

## 2024-12-10 MED ORDER — ACETAMINOPHEN 650 MG RE SUPP: 650.0000 mg | Freq: Four times a day (QID) | RECTAL | Status: DC | PRN

## 2024-12-10 MED ORDER — ACETAMINOPHEN 500 MG PO TABS
1000.0000 mg | ORAL_TABLET | Freq: Once | ORAL | Status: AC
  Administered 2024-12-10: 1000 mg via ORAL
  Filled 2024-12-10: qty 2

## 2024-12-10 MED ORDER — LACTATED RINGERS IV BOLUS
500.0000 mL | Freq: Once | INTRAVENOUS | Status: AC
  Administered 2024-12-10: 500 mL via INTRAVENOUS

## 2024-12-10 MED ORDER — ONDANSETRON HCL 4 MG PO TABS: 4.0000 mg | ORAL_TABLET | Freq: Four times a day (QID) | ORAL | Status: DC | PRN

## 2024-12-10 MED ORDER — LEVOTHYROXINE SODIUM 137 MCG PO TABS
137.0000 ug | ORAL_TABLET | Freq: Every day | ORAL | Status: DC
  Administered 2024-12-11 – 2024-12-12 (×2): 137 ug via ORAL
  Filled 2024-12-10 (×2): qty 1

## 2024-12-10 MED ORDER — ONDANSETRON HCL 4 MG/2ML IJ SOLN: 4.0000 mg | Freq: Four times a day (QID) | INTRAMUSCULAR | Status: DC | PRN

## 2024-12-10 MED ORDER — LOSARTAN POTASSIUM 50 MG PO TABS
50.0000 mg | ORAL_TABLET | Freq: Every day | ORAL | Status: DC
  Administered 2024-12-11 – 2024-12-12 (×2): 50 mg via ORAL
  Filled 2024-12-10 (×2): qty 1

## 2024-12-10 MED ORDER — SODIUM CHLORIDE 0.9 % IV SOLN
1.0000 g | INTRAVENOUS | Status: DC
  Filled 2024-12-10: qty 10

## 2024-12-10 MED ORDER — TAMSULOSIN HCL 0.4 MG PO CAPS
0.4000 mg | ORAL_CAPSULE | Freq: Every day | ORAL | Status: DC
  Administered 2024-12-11 – 2024-12-12 (×2): 0.4 mg via ORAL
  Filled 2024-12-10 (×2): qty 1

## 2024-12-10 MED ORDER — ENOXAPARIN SODIUM 40 MG/0.4ML IJ SOSY
40.0000 mg | PREFILLED_SYRINGE | INTRAMUSCULAR | Status: DC
  Administered 2024-12-10 – 2024-12-11 (×2): 40 mg via SUBCUTANEOUS
  Filled 2024-12-10 (×2): qty 0.4

## 2024-12-10 NOTE — Telephone Encounter (Signed)
 I will cancel the NIC for the colonoscopy in 5 yrs

## 2024-12-10 NOTE — ED Provider Notes (Signed)
 " [facility] AT [facility] Provider Note   CSN: [id number] Arrival date & time: 12/10/24  1538     Patient presents with: Flank Pain   [name] is a 74 y.o. male.    Flank Pain       [name] is a 74 y.o. male with past medical history of myasthenia gravis, hypertension, persistent atrial fibrillation, type 2 diabetes, GERD, kidney stones and prior prostatitis who presents to the Emergency Department complaining of sudden onset of shaking chills and low back pain.  Pain onset approximately 1 hour ago.  Describes having uncontrollable shaking chills, unknown fever.  He took 800 mg of ibuprofen prior to ER arrival.  Pain currently improved.  Denies any nausea vomiting.  He endorses some dysuria symptoms 2 days ago, felt as though he may be developing a urinary tract infection.  He has had some intermittent urinary incontinence.  History of prostatitis and kidney stones but states his current symptoms do not feel similar.  He denies any known injury nausea vomiting, abdominal pain, or pain numbness or weakness of his lower extremities, no saddle anesthesias.  He does take an immunosuppressive for his myasthenia gravis  Prior to Admission medications  Medication Sig Start Date End Date Taking? Authorizing Provider  acyclovir  (ZOVIRAX ) 400 MG tablet Take 800 mg by mouth daily.    [provider]  Ascorbic Acid  (VITAMIN C PO) Take 4,000 mg by mouth daily.    [provider]  azaTHIOprine  (IMURAN ) 50 MG tablet Take 150 mg by mouth daily.     [provider]  Calcium  Carbonate-Vit D-Min (CALCIUM  600+D3 PLUS MINERALS) 600-800 MG-UNIT TABS Take 2 tablets by mouth daily.    [provider]  fluconazole  (DIFLUCAN ) 200 MG tablet Take 2 tablets on day 1, then take 1 tablet daily for 13 days. 12/02/24   Ezzard Sonny RAMAN, PA-C  fluticasone  (FLONASE ) 50 MCG/ACT nasal spray Place 2 sprays into both nostrils daily. 08/02/22   Cobb,  Comer GAILS, NP  glimepiride (AMARYL) 1 MG tablet Take 2 mg by mouth 2 (two) times daily.    [provider]  ibuprofen (ADVIL) 200 MG tablet Take 400 mg by mouth daily as needed for moderate pain.    [provider]  losartan  (COZAAR ) 50 MG tablet TAKE (1) TABLET BY MOUTH ONCE DAILY. 01/25/24   Cindie Ole DASEN, MD  metFORMIN (GLUCOPHAGE-XR) 500 MG 24 hr tablet Take 1,000 mg by mouth 2 (two) times daily.    [provider]  Omega-3 Fatty Acids (FISH OIL ULTRA) 1400 MG CAPS Take 1,400 mg by mouth daily after lunch.    [provider]  omeprazole  (PRILOSEC) 20 MG capsule Take one capsule 30 minutes before breakfast, may take second dose before evening meal if needed. 11/19/24   Ezzard Sonny RAMAN, PA-C  ONETOUCH VERIO test strip 1 each 3 (three) times daily. 08/05/20   [provider]  Saw Palmetto 450 MG CAPS Take 2,250 mg by mouth daily after lunch.    [provider]  SYNTHROID  137 MCG tablet Take 137 mcg by mouth daily before breakfast. 07/03/13   [provider]  tadalafil  (CIALIS ) 20 MG tablet Take 1 tablet (20 mg total) by mouth as needed. 06/20/24   McKenzie, Belvie CROME, MD  tamsulosin  (FLOMAX ) 0.4 MG CAPS capsule Take 1 capsule (0.4 mg total) by mouth 2 (two) times daily. 04/02/24   McKenzie, Belvie CROME, MD  vitamin E  180 MG (400 UNITS)  capsule Take 800 Units by mouth daily after lunch.    [provider]    Allergies: Levofloxacin, Tequin [gatifloxacin], Iodinated contrast media, Levothyroxine  sodium, Limonene, Magnesium-containing compounds, Aminoglycosides, Avelox [moxifloxacin hcl in nacl], Botox [onabotulinumtoxina], Botulinum toxins, Calcium  channel blockers, Ciprofloxacin , Curare [tubocurarine], Dysport [abobotulinumtoxina], Erythromycin, Factive [gemifloxacin], Floxin [ofloxacin], Gentamycin [gentamicin], Interferons, Kanamycin, Ketek [telithromycin], Levaquin [levofloxacin in d5w], Levofloxacin, Macrolides and ketolides,  Myobloc [rimabotulinumtoxinb], Neomycin, Noroxin [norfloxacin], Other, Penicillamine, Procainamide, Propranolol, Quinidine, Quinine derivatives, Streptomycin, Timolol, Tobramycin, Xeomin [incobotulinumtoxina], and Zithromax [azithromycin]    Review of Systems  Constitutional:  Positive for chills.  Genitourinary:  Positive for dysuria and flank pain.  Musculoskeletal:  Positive for back pain.    Updated Vital Signs BP (!) 168/95 (BP Location: Right Arm)   Pulse (!) 125   Temp 98.9 F (37.2 C) (Oral)   Resp 20   Ht 6' 1 (1.854 m)   Wt 90.7 kg   SpO2 100%   BMI 26.39 kg/m   Physical Exam Vitals and nursing note reviewed.  HENT:     Mouth/Throat:     Mouth: Mucous membranes are moist.  Eyes:     Conjunctiva/sclera: Conjunctivae normal.  Cardiovascular:     Rate and Rhythm: Regular rhythm. Tachycardia present.     Pulses: Normal pulses.  Pulmonary:     Effort: Pulmonary effort is normal.     Breath sounds: Normal breath sounds.  Abdominal:     General: There is no distension.     Palpations: Abdomen is soft.     Tenderness: There is no abdominal tenderness. There is no right CVA tenderness or left CVA tenderness.  Musculoskeletal:     Lumbar back: Negative right straight leg raise test and negative left straight leg raise test.     Right lower leg: No edema.     Left lower leg: No edema.  Skin:    General: Skin is warm.     Capillary Refill: Capillary refill takes less than 2 seconds.  Neurological:     General: No focal deficit present.     Mental Status: He is alert.     Sensory: No sensory deficit.     Motor: No weakness.     (all labs ordered are listed, but only abnormal results are displayed) Labs Reviewed  URINALYSIS, ROUTINE W REFLEX MICROSCOPIC - Abnormal; Notable for the following components:      Result Value   Protein, ur 30 (*)    Nitrite POSITIVE (*)    Bacteria, UA RARE (*)    All other components within normal limits  CBC WITH  DIFFERENTIAL/PLATELET - Abnormal; Notable for the following components:   Lymphs Abs 0.3 (*)    Abs Immature Granulocytes 0.09 (*)    All other components within normal limits  COMPREHENSIVE METABOLIC PANEL WITH GFR - Abnormal; Notable for the following components:   CO2 21 (*)    Glucose, Bld 112 (*)    BUN 26 (*)    Anion gap 18 (*)    All other components within normal limits  CULTURE, BLOOD (ROUTINE X 2)  CULTURE, BLOOD (ROUTINE X 2)  URINE CULTURE  LACTIC ACID, PLASMA  PROTIME-INR    EKG: EKG Interpretation Date/Time:  Wednesday December 10 2024 18:04:13 EST Ventricular Rate:  109 PR Interval:  162 QRS Duration:  97 QT Interval:  298 QTC Calculation: 402 R Axis:   66  Text Interpretation: Sinus tachycardia Low voltage, precordial leads RSR' in V1 or V2, probably normal variant Confirmed  by Garrick Charleston [phone]) on 12/10/2024 6:21:34 PM  Radiology: CT ABDOMEN PELVIS WO CONTRAST Result Date: 12/10/2024 EXAM: CT ABDOMEN AND PELVIS WITHOUT CONTRAST 12/10/2024 05:00:14 PM TECHNIQUE: CT of the abdomen and pelvis was performed without the administration of intravenous contrast. Multiplanar reformatted images are provided for review. Automated exposure control, iterative reconstruction, and/or weight-based adjustment of the mA/kV was utilized to reduce the radiation dose to as low as reasonably achievable. COMPARISON: 05/13/2018 CLINICAL HISTORY: Abdominal/flank pain, stone suspected, sepsis, flank pain, low back pain, urinary tract infection (UTI), history of prostatitis. FINDINGS: 1. **LOWER CHEST:** Dependent subsegmental atelectasis in both lower lobes. check report for structure not belonging to this anatomic region  2. **PANCREAS:** Punctate calcifications along the pancreas suggesting mild chronic calcific pancreatitis. 3. **KIDNEYS, URETERS AND BLADDER:** 4 mm left kidney lower pole nonobstructive renal calculus and a punctate 1 mm left kidney nonobstructive renal calculus. No  hydronephrosis. No perinephric or periureteral stranding. Urinary bladder is unremarkable. 4. **GALLBLADDER AND BILE DUCTS:** Cholecystectomy. No biliary ductal dilatation. 5. **BONES AND SOFT TISSUES:** Lumbar spondylosis and degenerative disc disease causing bilateral foraminal impingement at L4-L5 and L5-S1. Grade 1 degenerative anterolisthesis of L5 on S1. Mild degenerative hip arthropathy bilaterally. Indirect left inguinal hernia contains adipose tissue. 6. **GI AND BOWEL:** Stomach demonstrates no acute abnormality. Descending and sigmoid colon diverticulosis. There is no bowel obstruction. LIVER: The liver is unremarkable. SPLEEN: No acute abnormality. ADRENAL GLANDS: No acute abnormality. PERITONEUM AND RETROPERITONEUM: No ascites. No free air. VASCULATURE: Aorta is normal in caliber. LYMPH NODES: No lymphadenopathy. REPRODUCTIVE ORGANS: No acute abnormality. IMPRESSION: 1. Nonobstructive left renal calculi measuring up to 4 mm. 2. No acute findings in the abdomen or pelvis. 3. Punctate pancreatic calcifications suggesting mild chronic calcific pancreatitis. 4. Descending and sigmoid colon diverticulosis. 5. Indirect left inguinal hernia containing adipose tissue. 6. Lumbar spondylosis and degenerative disc disease with bilateral foraminal impingement at l4-l5 and l5-s1, and grade 1 degenerative anterolisthesis of l5 on s1. 7. Mild degenerative hip arthropathy bilaterally. 8. Dependent subsegmental atelectasis in both lower lobes. 9. Cholecystectomy. Electronically signed by: Ryan Salvage MD 12/10/2024 05:12 PM EST RP Workstation: [id number]   DG Chest Port 1 View Result Date: 12/10/2024 EXAM: 1 VIEW XRAY OF THE CHEST 12/10/2024 04:28:36 PM COMPARISON: 09/19/2024 CLINICAL HISTORY: Questionable sepsis - evaluate for abnormality FINDINGS: LUNGS AND PLEURA: No focal pulmonary opacity. No pleural effusion. No pneumothorax. HEART AND MEDIASTINUM: Sternotomy wires noted. No acute abnormality of the  cardiac and mediastinal silhouettes. BONES AND SOFT TISSUES: No acute osseous abnormality. IMPRESSION: 1. No acute process. Electronically signed by: Greig Pique MD 12/10/2024 04:32 PM EST RP Workstation: [id number]     Procedures   Medications Ordered in the ED  lactated ringers  bolus 1,000 mL (has no administration in time range)                                    Medical Decision Making   Patient here for evaluation of sudden onset shaking chills, tachycardia and back pain.  Symptoms began while at work shortly before ER arrival.  He took 800 mg ibuprofen with some relief of his symptoms.  Notes having some dysuria symptoms earlier this week.  Notes history of prostatitis, UTIs and kidney stones.  Denies any abdominal pain, back injury saddle anesthesias, pain numbness or weakness of his groin area or lower extremities  On exam, patient is having shaking chills, tachycardic.  Afebrile here.  He did take ibuprofen just before ER arrival.  Concern for possible developing sepsis.  Will obtain labs, UA, will likely need imaging to rule out pyelonephritis or prostatitis.  He is on immunosuppressant for his myasthenia gravis.  Will obtain Noncon CT of the abdomen and pelvis.  He may require hospital admission  Amount and/or Complexity of Data Reviewed Labs: ordered.    Details: No leukocytosis, chemistries show mildly elevated BUN but his creatinine is reassuring.  Lactic acid unremarkable.  Urinalysis does show positive nitrates with 11-20 white cells and rare bacteria.  Urine and blood cultures are pending Radiology: ordered.    Details: Chest x-ray without acute process  CT abdomen and pelvis no obstructive uropathy.  No acute findings of the abdomen and pelvis Discussion of management or test interpretation with external provider(s): Patient receiving IV fluids, IV Rocephin , cultures are pending  On recheck, his tachycardia has improved somewhat after IV fluids, but not resolved.   Heart rate still in the 110's. recheck of temperature was 101.8 orally.  1 g of Tylenol  ordered.  Bacteremic with likely urine as source.  Agreeable to hospital admission.  Discussed with Thersia Salt, PA-C who agrees to arrange admission  Risk OTC drugs. Decision regarding hospitalization.        Final diagnoses:  Bilateral flank pain  Urinary tract infection without hematuria, site unspecified  Tachycardia, unspecified    ED Discharge Orders     None          Herlinda Madelin RIGGERS 12/10/24 1925    Garrick Charleston, MD 12/10/24 2234  "

## 2024-12-10 NOTE — H&P (Addendum)
 " History and Physical    Patient: [name] FMW:[id number] DOB: 08-20-1951 DOA: 12/10/2024 DOS: the patient was seen and examined on 12/10/2024 PCP: Marvine Rush, MD  Patient coming from: Home  Chief Complaint:  Chief Complaint  Patient presents with   Flank Pain   HPI: [name] is a 74 y.o. male with medical history significant of hypertension, type 2 diabetes mellitus, hypothyroidism, myasthenia gravis s/p thymectomy, chronic atrial fibrillation who presents to the emergency department due to sudden onset of low back pain and shaking chills which started about 1 hour PTA.  He endorsed taking 800 mg of ibuprofen prior to coming to the ED and this alleviated the pain.  Patient complained of 2-day onset of difficulty in urination and intermittent urinary incontinence, he suspected he was having UTI since he has history of recurrent UTIs.  He denies chest pain, shortness of breath, nausea, vomiting, abdominal pain.  ED course In the emergency department, temperature was 98.9 F on arrival to the ED, but subsequently developed fever of 101.77F.  He was tachycardic and subsequently tachypneic.  BP 160/95, O2 sat was 100% on room air.  Workup in the ED showed normal CBC and BMP except for bicarb of 21, blood glucose 112, BUN 26.  Anion gap 18.  Lactic acid was normal, urinalysis was positive for nitrite. CT abdomen and pelvis without contrast showed no acute findings in the abdomen or pelvis He was treated with IV ceftriaxone , Tylenol  was given due to fever and IV hydration was provided.   Review of Systems: As mentioned in the history of present illness. All other systems reviewed and are negative. Past Medical History:  Diagnosis Date   Candida esophagitis (HCC) 08/2013   Deafness in right ear    Diabetes mellitus without complication (HCC)    Difficult intubation    Esophageal stricture 07/2010   FOOD IMPACTION-->SAV DIL 16 MM   GERD (gastroesophageal reflux disease)    History of  kidney stones    Hypertension    Hypothyroidism    Kidney stone    Myasthenia gravis (HCC)    Sleep apnea    No CPAP   Thyroid  disease    Past Surgical History:  Procedure Laterality Date   ATRIAL FIBRILLATION ABLATION N/A 08/12/2020   Procedure: ATRIAL FIBRILLATION ABLATION;  Surgeon: Cindie Ole DASEN, MD;  Location: [facility];  Service: Cardiovascular;  Laterality: N/A;   BIOPSY  02/14/2018   Procedure: BIOPSY;  Surgeon: Harvey Margo CROME, MD;  Location: [facility];  Service: Endoscopy;;  gastric   CARDIOVERSION N/A 06/08/2020   Procedure: CARDIOVERSION;  Surgeon: Alvan Dorn FALCON, MD;  Location: [facility];  Service: Endoscopy;  Laterality: N/A;   CARDIOVERSION N/A 09/16/2020   Procedure: CARDIOVERSION;  Surgeon: Hobart Powell BRAVO, MD;  Location: [facility] ENDOSCOPY;  Service: Cardiovascular;  Laterality: N/A;   CHOLECYSTECTOMY     COLONOSCOPY  2005   Dr. Mavis   COLONOSCOPY N/A 07/17/2014   Procedure: COLONOSCOPY;  Surgeon: Margo CROME Harvey, MD;  Location: [facility];  Service: Endoscopy;  Laterality: N/A;  9:30-rescheduled 8/28 same time Anette Caldron to notify pt   COLONOSCOPY N/A 07/23/2024   Procedure: COLONOSCOPY;  Surgeon: Cindie Carlin POUR, DO;  Location: [facility];  Service: Endoscopy;  Laterality: N/A;  945am, asa 3   ESOPHAGOGASTRODUODENOSCOPY N/A 07/25/2013   Shallow ulcer in the cardia, mid esophageal web, stricture at GE junction   ESOPHAGOGASTRODUODENOSCOPY N/A 02/14/2018   Procedure: ESOPHAGOGASTRODUODENOSCOPY (EGD);  Surgeon:  Fields, Margo CROME, MD;  Location: [facility];  Service: Endoscopy;  Laterality: N/A;  11:15am   ESOPHAGOGASTRODUODENOSCOPY N/A 07/23/2024   Procedure: EGD (ESOPHAGOGASTRODUODENOSCOPY);  Surgeon: Cindie Carlin POUR, DO;  Location: [facility];  Service: Endoscopy;  Laterality: N/A;   ESOPHAGOGASTRODUODENOSCOPY (EGD) WITH ESOPHAGEAL DILATION N/A 08/29/2013   Candida esophagitis, small hiatal hernia, moderate nonerosive gastritis, mid  esophageal web   HERNIA REPAIR     right inguinal   KIDNEY STONE SURGERY     KNEE ARTHROSCOPY Right 03/12/2023   Procedure: RIGHT KNEE ARTHROSCOPY, MEDIAL AND LATERAL MENISECTOMY;  Surgeon: Liam Lerner, MD;  Location: [facility];  Service: Orthopedics;  Laterality: Right;   NASAL SEPTUM SURGERY     PALATE SURGERY         SAVORY DILATION N/A 02/14/2018   Procedure: SAVORY DILATION;  Surgeon: Harvey Margo CROME, MD;  Location: [facility];  Service: Endoscopy;  Laterality: N/A;   TEE WITHOUT CARDIOVERSION N/A 08/10/2020   Procedure: TRANSESOPHAGEAL ECHOCARDIOGRAM (TEE);  Surgeon: Raford Riggs, MD;  Location: [facility] ENDOSCOPY;  Service: Cardiovascular;  Laterality: N/A;   THYMECTOMY     TONSILLECTOMY     UPPER GASTROINTESTINAL ENDOSCOPY  SEP 2011   SAV DIL   Social History:  reports that he has never smoked. He has never used smokeless tobacco. He reports that he does not drink alcohol and does not use drugs.  Allergies[1]  Family History  Problem Relation Age of Onset   Liver cancer Father        age 84, deceased   Colon cancer Neg Hx     Prior to Admission medications  Medication Sig Start Date End Date Taking? Authorizing Provider  acyclovir  (ZOVIRAX ) 400 MG tablet Take 800 mg by mouth daily.    [provider]  Ascorbic Acid  (VITAMIN C PO) Take 4,000 mg by mouth daily.    [provider]  azaTHIOprine  (IMURAN ) 50 MG tablet Take 150 mg by mouth daily.     [provider]  Calcium  Carbonate-Vit D-Min (CALCIUM  600+D3 PLUS MINERALS) 600-800 MG-UNIT TABS Take 2 tablets by mouth daily.    [provider]  fluconazole  (DIFLUCAN ) 200 MG tablet Take 2 tablets on day 1, then take 1 tablet daily for 13 days. 12/02/24   Ezzard Sonny RAMAN, PA-C  fluticasone  (FLONASE ) 50 MCG/ACT nasal spray Place 2 sprays into both nostrils daily. 08/02/22   Cobb, Comer GAILS, NP  glimepiride (AMARYL) 1 MG tablet Take 2 mg by mouth 2 (two) times daily.    [provider]   ibuprofen (ADVIL) 200 MG tablet Take 400 mg by mouth daily as needed for moderate pain.    [provider]  losartan  (COZAAR ) 50 MG tablet TAKE (1) TABLET BY MOUTH ONCE DAILY. 01/25/24   Cindie Ole DASEN, MD  metFORMIN (GLUCOPHAGE-XR) 500 MG 24 hr tablet Take 1,000 mg by mouth 2 (two) times daily.    [provider]  Omega-3 Fatty Acids (FISH OIL ULTRA) 1400 MG CAPS Take 1,400 mg by mouth daily after lunch.    [provider]  omeprazole  (PRILOSEC) 20 MG capsule Take one capsule 30 minutes before breakfast, may take second dose before evening meal if needed. 11/19/24   Ezzard Sonny RAMAN, PA-C  ONETOUCH VERIO test strip 1 each 3 (three) times daily. 08/05/20   [provider]  Saw Palmetto 450 MG CAPS Take 2,250 mg by mouth daily after lunch.    [provider]  SYNTHROID  137 MCG tablet Take  137 mcg by mouth daily before breakfast. 07/03/13   [provider]  tadalafil  (CIALIS ) 20 MG tablet Take 1 tablet (20 mg total) by mouth as needed. 06/20/24   McKenzie, Belvie CROME, MD  tamsulosin  (FLOMAX ) 0.4 MG CAPS capsule Take 1 capsule (0.4 mg total) by mouth 2 (two) times daily. 04/02/24   McKenzie, Belvie CROME, MD  vitamin E  180 MG (400 UNITS) capsule Take 800 Units by mouth daily after lunch.    [provider]    Physical Exam: Vitals:   12/10/24 1550 12/10/24 1800 12/10/24 1856 12/10/24 1926  BP: (!) 168/95 (!) 149/71  130/63  Pulse: (!) 125 (!) 109  (!) 101  Resp: 20 (!) 24  (!) 27  Temp: 98.9 F (37.2 C)  (!) 101.4 F (38.6 C)   TempSrc: Oral  Oral   SpO2: 100% 96%  95%  Weight:      Height:       General: Elderly male. Awake and alert and oriented x3. Not in any acute distress.  HEENT: NCAT.  PERRLA. EOMI. Sclerae anicteric.  Moist mucosal membranes. Neck: Neck supple without lymphadenopathy. No carotid bruits. No masses palpated.  Cardiovascular: Tachycardia.  Regular rate with normal S1-S2 sounds. No murmurs, rubs or gallops  auscultated. No JVD.  Respiratory: Tachypnea.  Clear breath sounds.  No accessory muscle use. Abdomen: Soft, nontender, nondistended. Active bowel sounds. No masses or hepatosplenomegaly  Skin: No rashes, lesions, or ulcerations.  Dry, warm to touch. Musculoskeletal:  2+ dorsalis pedis and radial pulses. Good ROM.  No contractures  Psychiatric: Intact judgment and insight.  Mood appropriate to current condition. Neurologic: No focal neurological deficits. Strength is 5/5 x 4.  CN II - XII grossly intact.  Assessment and Plan: Sepsis secondary to UTI Prior urine culture on 04/03/2023 was positive for E. coli which was sensitive to ceftriaxone . He was started on ceftriaxone , we shall continue same at this time Urine and blood cultures pending  Myasthenia gravis  Continue azathioprine    History of atrial fibrillation Patient is s/p ablation and is no longer on rate control blood thinner medications EKG personally reviewed showed sinus tachycardia at rate of 109 bpm  Acquired hypothyroidism Continue synthroid     Type 2 Diabetes mellitus Hemoglobin A1c will be checked Continue ISS and hypoglycemia protocol Glimepiride and metformin will be held at this time   Essential hypertension Continue losartan    BPH Continue Flomax   GERD Continue Protonix     Advance Care Planning: Full code  Consults: None  Family Communication: Wife at bedside  Severity of Illness: The appropriate patient status for this patient is INPATIENT. Inpatient status is judged to be reasonable and necessary in order to provide the required intensity of service to ensure the patient's safety. The patient's presenting symptoms, physical exam findings, and initial radiographic and laboratory data in the context of their chronic comorbidities is felt to place them at high risk for further clinical deterioration. Furthermore, it is not anticipated that the patient will be medically stable for discharge from the  hospital within 2 midnights of admission.   * I certify that at the point of admission it is my clinical judgment that the patient will require inpatient hospital care spanning beyond 2 midnights from the point of admission due to high intensity of service, high risk for further deterioration and high frequency of surveillance required.*  Author: Keontay Vora, DO 12/10/2024 7:45 PM  For on call review [email or web address].      [1]  Allergies  Allergen Reactions   Levofloxacin Anaphylaxis   Tequin [Gatifloxacin] Anaphylaxis   Iodinated Contrast Media     MD told pt cannot have    Levothyroxine  Sodium Other (See Comments)    Myasthenia Gravis (MG) Medication Alert   Limonene Other (See Comments) and Rash    REACTION:Myasthenia Gravis (MG) Medication Alert REACTION:Myasthenia Gravis (MG) Medication Alert   Magnesium-Containing Compounds Rash and Other (See Comments)     Myasthenia gravis related  Denies rash.    Aminoglycosides Other (See Comments)    REACTION: Myasthenia Gravis (MG) Medication Alert   Avelox [Moxifloxacin Hcl In Nacl] Other (See Comments)    REACTION: FLUOROQUINOLONE ANTIBIOTICS: Myasthenia Gravis (MG) Medication Alert   Botox [Onabotulinumtoxina] Other (See Comments)    REACTION: Myasthenia Gravis (MG) Medication Alert   Botulinum Toxins Other (See Comments)    REACTION: Myasthenia Gravis (MG) Medication Alert   Calcium  Channel Blockers Other (See Comments)    (BLOOD PRESSURE MEDICATION) REACTION: Myasthenia Gravis (MG) Medication Alert   Ciprofloxacin  Other (See Comments)    REACTION: FLUOROQUINOLONE ANTIBIOTICS: Myasthenia Gravis (MG) Medication Alert   Curare [Tubocurarine] Other (See Comments)    (Usually only used during surgery)  REACTION: Myasthenia Gravis (MG) Medication Alert   Dysport [Abobotulinumtoxina] Other (See Comments)    REACTION: Myasthenia Gravis (MG) Medication Alert   Erythromycin Other (See Comments)    REACTION: Myasthenia Gravis  (MG) Medication Alert   Factive [Gemifloxacin] Other (See Comments)    REACTION: FLUOROQUINOLONE ANTIBIOTICS: Myasthenia Gravis (MG) Medication Alert   Floxin [Ofloxacin] Other (See Comments)    REACTION: FLUOROQUINOLONE ANTIBIOTICS: Myasthenia Gravis (MG) Medication Alert   Gentamycin [Gentamicin] Other (See Comments)    REACTION:Myasthenia Gravis (MG) Medication Alert   Interferons Other (See Comments)    REACTION: Myasthenia Gravis (MG) Medication Alert   Kanamycin Other (See Comments)    REACTION: Myasthenia Gravis (MG) Medication Alert   Ketek [Telithromycin] Other (See Comments)    REACTION: Myasthenia Gravis (MG) Medication Alert   Levaquin [Levofloxacin In D5w] Other (See Comments)    REACTION: FLUOROQUINOLONE ANTIBIOTICS: Myasthenia Gravis (MG) Medication Alert   Levofloxacin Other (See Comments)    Myasthenia Gravis (MG) Medication Alert   Macrolides And Ketolides Other (See Comments)    REACTION: Myasthenia Gravis (MG) Medication Alert REACTION: Myasthenia Gravis (MG) Medication Alert   Myobloc [Rimabotulinumtoxinb] Other (See Comments)    REACTION: Myasthenia Gravis (MG) Medication Alert   Neomycin Other (See Comments)    REACTION: Myasthenia Gravis (MG) Medication Alert   Noroxin [Norfloxacin] Other (See Comments)    REACTION: FLUOROQUINOLONE ANTIBIOTICS: Myasthenia Gravis (MG) Medication Alert   Other Other (See Comments)    MAGNESIUM SALTS: Milk of Magnesia, some antacids (Maalox, Mylanta) REACTION: Myasthenia Gravis (MG) Medication Alerta   Penicillamine Other (See Comments)    D-PENICILLAMINE *DO NOT CONFUSE WITH PENICILLIN*  REACTION: Myasthenia Gravis (MG) Medication Alert   Procainamide Other (See Comments)    REACTION: Myasthenia Gravis (MG) Medication Alert   Propranolol Other (See Comments)    REACTION: Myasthenia Gravis (MG) Medication Alert   Quinidine Other (See Comments)    REACTION: Myasthenia Gravis (MG) Medication Alert   Quinine Derivatives Other  (See Comments)    REACTION: Myasthenia Gravis (MG) Medication Alert   Streptomycin Other (See Comments)    REACTION:  Myasthenia Gravis (MG) Medication Alert   Timolol Other (See Comments)    REACTION: Myasthenia Gravis (MG) Medication Alert   Tobramycin Other (See Comments)    REACTION: Myasthenia Gravis (MG) Medication Alert  Xeomin [Incobotulinumtoxina] Other (See Comments)    REACTION: Myasthenia Gravis (MG) Medication Alert   Zithromax [Azithromycin] Other (See Comments)    REACTION: Myasthenia Gravis (MG) Medication Alert   "

## 2024-12-10 NOTE — ED Triage Notes (Signed)
 Pt with bilateral hips and lower back started today about 50 minutes. Hx of kidney stones and UTI's

## 2024-12-10 NOTE — Plan of Care (Signed)

## 2024-12-10 NOTE — Telephone Encounter (Signed)
 Pt was offer an appointment for Friday and pt state's he can not wait that long.    Dysuria  Patient called with c/o dysuria x 24 hours days.  Pain: Pt state's he have lower back pain, hip pain on both sides (ache)   Severity:9/10  Associated Signs and Symptoms:  Fever: noTemp.0 Chills: yes Hematuria: no have been to the bathroom today Urgency: no Frequency: no Hesitancy:yes Incontinence: no Nausea: no Vomiting: no  Urologic History:  Any Recent Urologic Surgeries or Procedures:no Recurrent UTI's:yes Cystitis: no  Prostatitis:no Kidney or Bladder Stones: yes 6 stones in the pass Plan: Walk-in Clinic: no Appointment w/Physician: [no Lab visit scheduled for urine drop off: Yes Advice given:  Do you take on daily medications for UTI suppression No

## 2024-12-10 NOTE — ED Provider Notes (Signed)
 Signout from [employer], PA-C at shift change. Briefly, patient presents for low back pain and feels.  States bilateral flank pain started acutely approximately an hour prior to arrival.  History of myasthenia gravis, prostatitis, kidney stones.   Plan: Given rocephin  and LR. Given more fluids. CT abdomen normal. Still tachycardic and febrile at 101. Admit for UTI and sepsis.    7:12 PM Reassessment performed. Patient appears resting comfortably but still febrile and tachycardic.  Labs and imaging personally reviewed and interpreted including: CT abdomen pelvis negative for acute abnormality.  He does have a UTI on UA.   Reviewed additional pertinent lab work and imaging with patient at bedside including: Findings discussed with patient and wife and they are agreeable to admission.   Most current vital signs reviewed and are as follows: BP (!) 149/71   Pulse (!) 109   Temp 98.9 F (37.2 C) (Oral)   Resp (!) 24   Ht 6' 1 (1.854 m)   Wt 90.7 kg   SpO2 96%   BMI 26.39 kg/m     Plan: Has been given Rocephin  and just given Tylenol .  Given a liter and a half of fluids.  As he is septic in the setting of UTI, will admit for further evaluation.  Hospitalist consulted and agreeable to admit for further evaluation and management.         Neysa Thersia RAMAN, PA-C 12/10/24 1945    Garrick Charleston, MD 12/10/24 2234

## 2024-12-10 NOTE — Telephone Encounter (Signed)
 Per colonoscopy report and per Dr. Cindie after path reviewed, patient does not need recall for colonoscopy, no plans for future colonoscopy due to age unless new symptoms arise.   Per EGD report, initially mentioned for EGD in five years pending path. Path returned negative for Barrett's. No need for NIC for EGD.  Mandy, there is a NIC for colonoscopy in five years, please cancel.

## 2024-12-11 DIAGNOSIS — A419 Sepsis, unspecified organism: Secondary | ICD-10-CM | POA: Diagnosis not present

## 2024-12-11 DIAGNOSIS — N390 Urinary tract infection, site not specified: Secondary | ICD-10-CM | POA: Diagnosis not present

## 2024-12-11 LAB — BLOOD CULTURE ID PANEL (REFLEXED) - BCID2

## 2024-12-11 LAB — CBC
HCT: 35.4 % — ABNORMAL LOW (ref 39.0–52.0)
Hemoglobin: 12.1 g/dL — ABNORMAL LOW (ref 13.0–17.0)
MCH: 32.3 pg (ref 26.0–34.0)
MCHC: 34.2 g/dL (ref 30.0–36.0)
MCV: 94.4 fL (ref 80.0–100.0)
Platelets: 171 K/uL (ref 150–400)
RBC: 3.75 MIL/uL — ABNORMAL LOW (ref 4.22–5.81)
RDW: 14.6 % (ref 11.5–15.5)
WBC: 9.5 K/uL (ref 4.0–10.5)
nRBC: 0 % (ref 0.0–0.2)

## 2024-12-11 LAB — GLUCOSE, CAPILLARY
Glucose-Capillary: 130 mg/dL — ABNORMAL HIGH (ref 70–99)
Glucose-Capillary: 131 mg/dL — ABNORMAL HIGH (ref 70–99)
Glucose-Capillary: 107 mg/dL — ABNORMAL HIGH (ref 70–99)
Glucose-Capillary: 113 mg/dL — ABNORMAL HIGH (ref 70–99)

## 2024-12-11 LAB — HEMOGLOBIN A1C
Hgb A1c MFr Bld: 6.4 % — ABNORMAL HIGH (ref 4.8–5.6)
Mean Plasma Glucose: 136.98 mg/dL

## 2024-12-11 LAB — COMPREHENSIVE METABOLIC PANEL WITH GFR
ALT: 22 U/L (ref 0–44)
AST: 19 U/L (ref 15–41)
Albumin: 3.7 g/dL (ref 3.5–5.0)
Alkaline Phosphatase: 57 U/L (ref 38–126)
Anion gap: 14 (ref 5–15)
BUN: 26 mg/dL — ABNORMAL HIGH (ref 8–23)
CO2: 23 mmol/L (ref 22–32)
Calcium: 8.9 mg/dL (ref 8.9–10.3)
Chloride: 105 mmol/L (ref 98–111)
Creatinine, Ser: 1.28 mg/dL — ABNORMAL HIGH (ref 0.61–1.24)
GFR, Estimated: 59 mL/min — ABNORMAL LOW
Glucose, Bld: 95 mg/dL (ref 70–99)
Potassium: 3.7 mmol/L (ref 3.5–5.1)
Sodium: 141 mmol/L (ref 135–145)
Total Bilirubin: 0.7 mg/dL (ref 0.0–1.2)
Total Protein: 5.9 g/dL — ABNORMAL LOW (ref 6.5–8.1)

## 2024-12-11 MED ORDER — PANTOPRAZOLE SODIUM 20 MG PO TBEC
20.0000 mg | DELAYED_RELEASE_TABLET | Freq: Once | ORAL | Status: DC
  Filled 2024-12-11: qty 1

## 2024-12-11 MED ORDER — SODIUM CHLORIDE 0.9 % IV SOLN
2.0000 g | INTRAVENOUS | Status: DC
  Administered 2024-12-11 – 2024-12-12 (×2): 2 g via INTRAVENOUS
  Filled 2024-12-11 (×2): qty 20

## 2024-12-11 MED ORDER — AZATHIOPRINE 50 MG PO TABS
150.0000 mg | ORAL_TABLET | Freq: Every day | ORAL | Status: DC
  Administered 2024-12-11 – 2024-12-12 (×2): 150 mg via ORAL
  Filled 2024-12-11 (×3): qty 3

## 2024-12-11 MED ORDER — ACYCLOVIR 200 MG PO CAPS
800.0000 mg | ORAL_CAPSULE | Freq: Every day | ORAL | Status: DC
  Administered 2024-12-11 – 2024-12-12 (×2): 800 mg via ORAL
  Filled 2024-12-11 (×2): qty 4

## 2024-12-11 NOTE — Plan of Care (Signed)
" °  Problem: Education: Goal: Knowledge of General Education information will improve Description: Including pain rating scale, medication(s)/side effects and non-pharmacologic comfort measures Outcome: Progressing   Problem: Health Behavior/Discharge Planning: Goal: Ability to manage health-related needs will improve Outcome: Progressing   Problem: Clinical Measurements: Goal: Diagnostic test results will improve Outcome: Progressing   Problem: Elimination: Goal: Will not experience complications related to bowel motility Outcome: Progressing   Problem: Safety: Goal: Ability to remain free from injury will improve Outcome: Progressing   "

## 2024-12-11 NOTE — Progress Notes (Signed)
 " PROGRESS NOTE    [name]  FMW:[id number] DOB: October 15, 1951 DOA: 12/10/2024 PCP: Marvine Rush, MD   Brief Narrative:   74 year old male with history of hypertension, type 2 diabetes, hypothyroidism, myasthenia gravis status post thymectomy, chronic A-fib presented to the ER with sudden onset of low back pain and shaking chills about 1 hour prior to presentation.  Ibuprofen did not help.  Patient also complained of 2-day onset of difficulty in urination and intermittent urinary incontinence suspecting UTIs.  He has history of recurrent UTIs.  In the ER he developed fever of 101.4 F, tachycardic and tachypneic.  UA was positive for urinary tract infection.  CT abdomen and pelvis without contrast showed no acute findings in the abdomen or pelvis. Patient initiated on fluids and IV antibiotics.  Admitted for further management.   Assessment & Plan:   Principal Problem:   Sepsis secondary to UTI (HCC)   Sepsis secondary to UTI Gram-negative bacteremia:  Urine culture as well as blood culture positive for Klebsiella pneumonia. Will continue IV ceftriaxone , await susceptibilities Patient feels significantly better. Expect patient can be discharged home tomorrow on oral antibiotics.  Myasthenia gravis  Continue azathioprine    History of atrial fibrillation Patient is s/p ablation and is no longer on rate control blood thinner medications EKG personally reviewed showed sinus tachycardia at rate of 109 bpm   Acquired hypothyroidism Continue synthroid     Type 2 Diabetes mellitus Hemoglobin A1c will be checked Continue ISS and hypoglycemia protocol Glimepiride and metformin will be held at this time   Essential hypertension Continue losartan    BPH Continue Flomax    GERD Continue Protonix       Advance Care Planning: Full code   Consults: None   Family Communication: Wife at bedside   Severity of Illness: The appropriate patient status for this patient is INPATIENT.  Inpatient status is judged to be reasonable and necessary in order to provide the required intensity of service to ensure the patient's safety. The patient's presenting symptoms, physical exam findings, and initial radiographic and laboratory data in the context of their chronic comorbidities is felt to place them at high risk for further clinical deterioration. Furthermore, it is not anticipated that the patient will be medically stable for discharge from the hospital within 2 midnights of admission.    * I certify that at the point of admission it is my clinical judgment that the patient will require inpatient hospital care spanning beyond 2 midnights from the point of admission due to high intensity of service, high risk for further deterioration and high frequency of surveillance required.*     Subjective:  Patient feels significantly better.  He is up and ambulating.  No fever or chills.  Objective: Vitals:   12/11/24 0020 12/11/24 0504 12/11/24 0815 12/11/24 1222  BP: 96/60 (!) 110/57 112/61 119/70  Pulse:  72 68 67  Resp:  17 20 18   Temp: 97.8 F (36.6 C) 98 F (36.7 C) 97.7 F (36.5 C) 98.1 F (36.7 C)  TempSrc:  Oral Oral Axillary  SpO2:  96% 97% 100%  Weight:      Height:        Intake/Output Summary (Last 24 hours) at 12/11/2024 1518 Last data filed at 12/11/2024 0852 Gross per 24 hour  Intake 1360 ml  Output --  Net 1360 ml   Filed Weights   12/10/24 1547 12/10/24 2031  Weight: 90.7 kg 93 kg    Examination:  General exam: Appears calm and comfortable  Respiratory  system: Bilateral decreased breath sounds at bases Cardiovascular system: S1 & S2 heard, Rate controlled Gastrointestinal system: Abdomen is nondistended, soft and nontender. Normal bowel sounds heard. Extremities: No cyanosis, clubbing, edema  Central nervous system: Alert and oriented. No focal neurological deficits. Moving extremities Skin: No rashes, lesions or ulcers Psychiatry: Judgement and  insight appear normal. Mood & affect appropriate.     Data Reviewed: I have personally reviewed following labs and imaging studies  CBC: Recent Labs  Lab 12/10/24 1618 12/11/24 0433  WBC 8.4 9.5  NEUTROABS 7.6  --   HGB 14.5 12.1*  HCT 41.9 35.4*  MCV 95.4 94.4  PLT 215 171   Basic Metabolic Panel: Recent Labs  Lab 12/10/24 1618 12/11/24 0433  NA 142 141  K 4.2 3.7  CL 103 105  CO2 21* 23  GLUCOSE 112* 95  BUN 26* 26*  CREATININE 1.20 1.28*  CALCIUM  9.9 8.9  MG 1.0*  --   PHOS 2.5  --    GFR: Estimated Creatinine Clearance: 58.1 mL/min (A) (by C-G formula based on SCr of 1.28 mg/dL (H)). Liver Function Tests: Recent Labs  Lab 12/10/24 1618 12/11/24 0433  AST 25 19  ALT 29 22  ALKPHOS 76 57  BILITOT 0.6 0.7  PROT 7.2 5.9*  ALBUMIN 4.6 3.7   No results for input(s): LIPASE, AMYLASE in the last 168 hours. No results for input(s): AMMONIA in the last 168 hours. Coagulation Profile: Recent Labs  Lab 12/10/24 1618  INR 0.9   Cardiac Enzymes: No results for input(s): CKTOTAL, CKMB, CKMBINDEX, TROPONINI in the last 168 hours. BNP (last 3 results) No results for input(s): PROBNP in the last 8760 hours. HbA1C: Recent Labs    12/10/24 1618  HGBA1C 6.4*   CBG: Recent Labs  Lab 12/10/24 2050 12/11/24 0727 12/11/24 1105  GLUCAP 122* 130* 131*   Lipid Profile: No results for input(s): CHOL, HDL, LDLCALC, TRIG, CHOLHDL, LDLDIRECT in the last 72 hours. Thyroid  Function Tests: No results for input(s): TSH, T4TOTAL, FREET4, T3FREE, THYROIDAB in the last 72 hours. Anemia Panel: No results for input(s): VITAMINB12, FOLATE, FERRITIN, TIBC, IRON, RETICCTPCT in the last 72 hours. Sepsis Labs: Recent Labs  Lab 12/10/24 1634  LATICACIDVEN 1.4    Recent Results (from the past 240 hours)  Urine Culture     Status: Abnormal (Preliminary result)   Collection Time: 12/10/24  3:45 PM   Specimen: Urine, Clean  Catch  Result Value Ref Range Status   Specimen Description   Final    URINE, CLEAN CATCH Performed at [facility], [address] [place], [place] [address]    Special Requests   Final    NONE Performed at [facility], [address] [place], [place] [address]    Culture >=100,000 COLONIES/mL KLEBSIELLA PNEUMONIAE (A)  Final   Report Status PENDING  Incomplete  Culture, blood (routine x 2)     Status: None (Preliminary result)   Collection Time: 12/10/24  4:18 PM   Specimen: BLOOD  Result Value Ref Range Status   Specimen Description   Final    BLOOD RIGHT ANTECUBITAL Performed at [facility], [address], [place], [place] [address]    Special Requests   Final    BOTTLES DRAWN AEROBIC AND ANAEROBIC Blood Culture adequate volume Performed at [facility], [address] [place], [place] [address]    Culture  Setup Time   Final    GRAM NEGATIVE RODS ANAEROBIC BOTTLE ONLY Gram Stain Report Called to,Read  Back By and Verified With: S. STEVENSON [id number], VIRAY,J CRITICAL VALUE NOTED.  VALUE IS CONSISTENT WITH PREVIOUSLY REPORTED AND CALLED VALUE. Performed at [facility], [address] [place], [place] [address]    Culture GRAM NEGATIVE RODS  Final   Report Status PENDING  Incomplete  Culture, blood (routine x 2)     Status: None (Preliminary result)   Collection Time: 12/10/24  4:34 PM   Specimen: BLOOD  Result Value Ref Range Status   Specimen Description   Final    BLOOD LEFT ANTECUBITAL Performed at [facility], [address] [place], [place] [address]    Special Requests   Final    BOTTLES DRAWN AEROBIC AND ANAEROBIC Blood Culture adequate volume Performed at [facility], [address], [place], [place] [address]    Culture  Setup Time   Final    GRAM NEGATIVE RODS IN BOTH AEROBIC AND ANAEROBIC BOTTLES Gram Stain Report Called to,Read Back By and Verified With: S. STEVENSON [id number], VIRAY,J CRITICAL RESULT CALLED TO, READ BACK BY AND VERIFIED  WITH: MAYA JULIANNA BLUSH [id number] AT 1029 BY CM Performed at [facility], [address], [place], [place] [address]    Culture GRAM NEGATIVE RODS  Final   Report Status PENDING  Incomplete  Blood Culture ID Panel (Reflexed)     Status: Abnormal   Collection Time: 12/10/24  4:34 PM  Result Value Ref Range Status   Enterococcus faecalis NOT DETECTED NOT DETECTED Final   Enterococcus Faecium NOT DETECTED NOT DETECTED Final   Listeria monocytogenes NOT DETECTED NOT DETECTED Final   Staphylococcus species NOT DETECTED NOT DETECTED Final   Staphylococcus aureus (BCID) NOT DETECTED NOT DETECTED Final   Staphylococcus epidermidis NOT DETECTED NOT DETECTED Final   Staphylococcus lugdunensis NOT DETECTED NOT DETECTED Final   Streptococcus species NOT DETECTED NOT DETECTED Final   Streptococcus agalactiae NOT DETECTED NOT DETECTED Final   Streptococcus pneumoniae NOT DETECTED NOT DETECTED Final   Streptococcus pyogenes NOT DETECTED NOT DETECTED Final   A.calcoaceticus-baumannii NOT DETECTED NOT DETECTED Final   Bacteroides fragilis NOT DETECTED NOT DETECTED Final   Enterobacterales DETECTED (A) NOT DETECTED Final    Comment: Enterobacterales represent a large order of gram negative bacteria, not a single organism. CRITICAL RESULT CALLED TO, READ BACK BY AND VERIFIED WITH: PHARMD F WILSON [id number] AT 1026 BY CM    Enterobacter cloacae complex NOT DETECTED NOT DETECTED Final   Escherichia coli NOT DETECTED NOT DETECTED Final   Klebsiella aerogenes NOT DETECTED NOT DETECTED Final   Klebsiella oxytoca NOT DETECTED NOT DETECTED Final   Klebsiella pneumoniae DETECTED (A) NOT DETECTED Final    Comment: CRITICAL RESULT CALLED TO, READ BACK BY AND VERIFIED WITH: PHARMD F WILSON [id number] AT 1026 BY CM    Proteus species NOT DETECTED NOT DETECTED Final   Salmonella species NOT DETECTED NOT DETECTED Final   Serratia marcescens NOT DETECTED NOT DETECTED Final   Haemophilus influenzae NOT DETECTED NOT  DETECTED Final   Neisseria meningitidis NOT DETECTED NOT DETECTED Final   Pseudomonas aeruginosa NOT DETECTED NOT DETECTED Final   Stenotrophomonas maltophilia NOT DETECTED NOT DETECTED Final   Candida albicans NOT DETECTED NOT DETECTED Final   Candida auris NOT DETECTED NOT DETECTED Final   Candida glabrata NOT DETECTED NOT DETECTED Final   Candida krusei NOT DETECTED NOT DETECTED Final   Candida parapsilosis NOT DETECTED NOT DETECTED Final   Candida tropicalis NOT DETECTED NOT DETECTED Final  Cryptococcus neoformans/gattii NOT DETECTED NOT DETECTED Final   CTX-M ESBL NOT DETECTED NOT DETECTED Final   Carbapenem resistance IMP NOT DETECTED NOT DETECTED Final   Carbapenem resistance KPC NOT DETECTED NOT DETECTED Final   Carbapenem resistance NDM NOT DETECTED NOT DETECTED Final   Carbapenem resist OXA 48 LIKE NOT DETECTED NOT DETECTED Final   Carbapenem resistance VIM NOT DETECTED NOT DETECTED Final    Comment: Performed at [facility], [address], [place], [place] [address]         Radiology Studies:       Scheduled Meds:  enoxaparin  (LOVENOX ) injection  40 mg Subcutaneous Q24H   insulin  aspart  0-15 Units Subcutaneous TID WC   levothyroxine   137 mcg Oral Q0600   losartan   50 mg Oral Daily   pantoprazole   40 mg Oral Daily   tamsulosin   0.4 mg Oral Daily   Continuous Infusions:  cefTRIAXone  (ROCEPHIN )  IV 2 g (12/11/24 1429)          Derryl Duval, MD [facility] 12/11/2024, 3:18 PM   "

## 2024-12-11 NOTE — TOC CM/SW Note (Signed)
 Transition of Care [facility]) - Inpatient Brief Assessment   Patient Details  Name: [name] MRN: [id number] Date of Birth: 11-30-1950  Transition of Care [facility]) CM/SW Contact:    Lucie Lunger, LCSWA Phone Number: 12/11/2024, 9:01 AM   Clinical Narrative: Transition of Care Department [facility]) has reviewed patient and no TOC needs have been identified at this time. We will continue to monitor patient advancement through interdiciplinary progression rounds. If new patient transition needs arise, please place a TOC consult.  Transition of Care Asessment: Insurance and Status: Insurance coverage has been reviewed Patient has primary care physician: Yes Home environment has been reviewed: From home Prior level of function:: Independent Prior/Current Home Services: No current home services Social Drivers of Health Review: SDOH reviewed no interventions necessary Readmission risk has been reviewed: Yes Transition of care needs: no transition of care needs at this time

## 2024-12-11 NOTE — Progress Notes (Signed)
 PHARMACY - PHYSICIAN COMMUNICATION CRITICAL VALUE ALERT - BLOOD CULTURE IDENTIFICATION (BCID)  [name] is an 74 y.o. male who presented to [facility] on 12/10/2024 with a chief complaint of flank pain.   Assessment:  Patient with 3/4 bottles growing GNR, BCID shows klebsiella pneumoniae.   Name of physician (or Provider) Contacted: Sigdel  Current antibiotics: ceftriaxone   Changes to prescribed antibiotics recommended:  Patient is on recommended antibiotics - No changes needed  Results for orders placed or performed during the hospital encounter of 12/10/24  Blood Culture ID Panel (Reflexed) (Collected: 12/10/2024  4:34 PM)  Result Value Ref Range   Enterococcus faecalis NOT DETECTED NOT DETECTED   Enterococcus Faecium NOT DETECTED NOT DETECTED   Listeria monocytogenes NOT DETECTED NOT DETECTED   Staphylococcus species NOT DETECTED NOT DETECTED   Staphylococcus aureus (BCID) NOT DETECTED NOT DETECTED   Staphylococcus epidermidis NOT DETECTED NOT DETECTED   Staphylococcus lugdunensis NOT DETECTED NOT DETECTED   Streptococcus species NOT DETECTED NOT DETECTED   Streptococcus agalactiae NOT DETECTED NOT DETECTED   Streptococcus pneumoniae NOT DETECTED NOT DETECTED   Streptococcus pyogenes NOT DETECTED NOT DETECTED   A.calcoaceticus-baumannii NOT DETECTED NOT DETECTED   Bacteroides fragilis NOT DETECTED NOT DETECTED   Enterobacterales DETECTED (A) NOT DETECTED   Enterobacter cloacae complex NOT DETECTED NOT DETECTED   Escherichia coli NOT DETECTED NOT DETECTED   Klebsiella aerogenes NOT DETECTED NOT DETECTED   Klebsiella oxytoca NOT DETECTED NOT DETECTED   Klebsiella pneumoniae DETECTED (A) NOT DETECTED   Proteus species NOT DETECTED NOT DETECTED   Salmonella species NOT DETECTED NOT DETECTED   Serratia marcescens NOT DETECTED NOT DETECTED   Haemophilus influenzae NOT DETECTED NOT DETECTED   Neisseria meningitidis NOT DETECTED NOT DETECTED   Pseudomonas aeruginosa NOT  DETECTED NOT DETECTED   Stenotrophomonas maltophilia NOT DETECTED NOT DETECTED   Candida albicans NOT DETECTED NOT DETECTED   Candida auris NOT DETECTED NOT DETECTED   Candida glabrata NOT DETECTED NOT DETECTED   Candida krusei NOT DETECTED NOT DETECTED   Candida parapsilosis NOT DETECTED NOT DETECTED   Candida tropicalis NOT DETECTED NOT DETECTED   Cryptococcus neoformans/gattii NOT DETECTED NOT DETECTED   CTX-M ESBL NOT DETECTED NOT DETECTED   Carbapenem resistance IMP NOT DETECTED NOT DETECTED   Carbapenem resistance KPC NOT DETECTED NOT DETECTED   Carbapenem resistance NDM NOT DETECTED NOT DETECTED   Carbapenem resist OXA 48 LIKE NOT DETECTED NOT DETECTED   Carbapenem resistance VIM NOT DETECTED NOT DETECTED   Dempsey Blush PharmD., BCPS Clinical Pharmacist 12/11/2024 10:37 AM

## 2024-12-12 DIAGNOSIS — A419 Sepsis, unspecified organism: Secondary | ICD-10-CM | POA: Diagnosis not present

## 2024-12-12 DIAGNOSIS — B961 Klebsiella pneumoniae [K. pneumoniae] as the cause of diseases classified elsewhere: Secondary | ICD-10-CM

## 2024-12-12 DIAGNOSIS — N390 Urinary tract infection, site not specified: Secondary | ICD-10-CM | POA: Diagnosis not present

## 2024-12-12 LAB — GLUCOSE, CAPILLARY
Glucose-Capillary: 157 mg/dL — ABNORMAL HIGH (ref 70–99)
Glucose-Capillary: 194 mg/dL — ABNORMAL HIGH (ref 70–99)

## 2024-12-12 LAB — URINE CULTURE: Culture: >=100000 — AB

## 2024-12-12 MED ORDER — CEFADROXIL 500 MG PO CAPS: 1000.0000 mg | ORAL_CAPSULE | Freq: Two times a day (BID) | ORAL | 0 refills | Status: AC

## 2024-12-12 NOTE — Care Management Important Message (Signed)
 Important Message  Patient Details  Name: [name] MRN: [id number] Date of Birth: [birth year]   Important Message Given:  N/A - LOS <3 / Initial given by admissions     [name] Ada 12/12/2024, 12:04 PM

## 2024-12-12 NOTE — Progress Notes (Signed)
 Patient has discharge orders, discharge teaching given and no further questions at this time. Patient decided to walk down to vehicle.

## 2024-12-12 NOTE — Plan of Care (Signed)

## 2024-12-12 NOTE — Discharge Summary (Signed)
 Physician Discharge Summary  [name] FMW:[id number] DOB: June 10, 1951 DOA: 12/10/2024  PCP: Marvine Rush, MD  Admit date: 12/10/2024 Discharge date: 12/12/2024  Admitted From: Home Disposition: Home  Recommendations for Outpatient Follow-up:  Follow up with PCP in 1 week   Home Health: No Equipment/Devices: None  Discharge Condition: Stable CODE STATUS: Full Diet recommendation: Heart healthy  Brief/Interim Summary:  74 year old male with history of hypertension, type 2 diabetes, hypothyroidism, myasthenia gravis status post thymectomy, chronic A-fib presented to the ER with sudden onset of low back pain and shaking chills about 1 hour prior to presentation.  Ibuprofen did not help.  Patient also complained of 2-day onset of difficulty in urination and intermittent urinary incontinence suspecting UTIs.  He has history of recurrent UTIs.  In the ER he developed fever of 101.4 F, tachycardic and tachypneic.  UA was positive for urinary tract infection.  CT abdomen and pelvis without contrast showed no acute findings in the abdomen or pelvis. Patient initiated on fluids and IV antibiotics.  Admitted for further management.  He responded very well with IV antibiotics.  Urine as well as blood culture both came positive for Klebsiella pneumoniae.  He was subsequently discharged on Duricef 1 g twice daily for 8 days to complete 10 days of therapy.  His chronic medical issues remain stable.      Discharge Diagnoses:  Principal Problem:   Sepsis secondary to UTI [facility]) Active Problems:   Chronic anticoagulation   Bacteremia due to Klebsiella pneumoniae   GERD (gastroesophageal reflux disease)   Myasthenia gravis (HCC)   Persistent atrial fibrillation (HCC)   Benign prostatic hyperplasia with urinary obstruction   Type 2 diabetes mellitus with hyperglycemia, without long-term current use of insulin  [facility])    Discharge Instructions  Discharge Instructions     Call MD for:   severe uncontrolled pain   Complete by: As directed    Call MD for:  temperature >100.4   Complete by: As directed    Discharge instructions   Complete by: As directed    1.  Complete course of antibiotics for bacteremia, Duricef 1 g twice daily for 8 more days   Increase activity slowly   Complete by: As directed       Allergies as of 12/12/2024       Reactions   Levofloxacin Anaphylaxis   Tequin [gatifloxacin] Anaphylaxis   Iodinated Contrast Media    MD told pt cannot have   Levothyroxine  Sodium Other (See Comments)   Myasthenia Gravis (MG) Medication Alert   Limonene Other (See Comments), Rash   REACTION:Myasthenia Gravis (MG) Medication Alert REACTION:Myasthenia Gravis (MG) Medication Alert   Magnesium-containing Compounds Rash, Other (See Comments)   Myasthenia gravis related  Denies rash.   Aminoglycosides Other (See Comments)   REACTION: Myasthenia Gravis (MG) Medication Alert   Avelox [moxifloxacin Hcl In Nacl] Other (See Comments)   REACTION: FLUOROQUINOLONE ANTIBIOTICS: Myasthenia Gravis (MG) Medication Alert   Botox [onabotulinumtoxina] Other (See Comments)   REACTION: Myasthenia Gravis (MG) Medication Alert   Botulinum Toxins Other (See Comments)   REACTION: Myasthenia Gravis (MG) Medication Alert   Calcium  Channel Blockers Other (See Comments)   (BLOOD PRESSURE MEDICATION) REACTION: Myasthenia Gravis (MG) Medication Alert   Ciprofloxacin  Other (See Comments)   REACTION: FLUOROQUINOLONE ANTIBIOTICS: Myasthenia Gravis (MG) Medication Alert   Curare [tubocurarine] Other (See Comments)   (Usually only used during surgery)  REACTION: Myasthenia Gravis (MG) Medication Alert   Dysport [abobotulinumtoxina] Other (See Comments)   REACTION: Myasthenia Gravis (  MG) Medication Alert   Erythromycin Other (See Comments)   REACTION: Myasthenia Gravis (MG) Medication Alert   Factive [gemifloxacin] Other (See Comments)   REACTION: FLUOROQUINOLONE ANTIBIOTICS: Myasthenia  Gravis (MG) Medication Alert   Floxin [ofloxacin] Other (See Comments)   REACTION: FLUOROQUINOLONE ANTIBIOTICS: Myasthenia Gravis (MG) Medication Alert   Gentamycin [gentamicin] Other (See Comments)   REACTION:Myasthenia Gravis (MG) Medication Alert   Interferons Other (See Comments)   REACTION: Myasthenia Gravis (MG) Medication Alert   Kanamycin Other (See Comments)   REACTION: Myasthenia Gravis (MG) Medication Alert   Ketek [telithromycin] Other (See Comments)   REACTION: Myasthenia Gravis (MG) Medication Alert   Levaquin [levofloxacin In D5w] Other (See Comments)   REACTION: FLUOROQUINOLONE ANTIBIOTICS: Myasthenia Gravis (MG) Medication Alert   Levofloxacin Other (See Comments)   Myasthenia Gravis (MG) Medication Alert   Macrolides And Ketolides Other (See Comments)   REACTION: Myasthenia Gravis (MG) Medication Alert REACTION: Myasthenia Gravis (MG) Medication Alert   Myobloc [rimabotulinumtoxinb] Other (See Comments)   REACTION: Myasthenia Gravis (MG) Medication Alert   Neomycin Other (See Comments)   REACTION: Myasthenia Gravis (MG) Medication Alert   Noroxin [norfloxacin] Other (See Comments)   REACTION: FLUOROQUINOLONE ANTIBIOTICS: Myasthenia Gravis (MG) Medication Alert   Other Other (See Comments)   MAGNESIUM SALTS: Milk of Magnesia, some antacids (Maalox, Mylanta) REACTION: Myasthenia Gravis (MG) Medication Alerta   Penicillamine Other (See Comments)   D-PENICILLAMINE *DO NOT CONFUSE WITH PENICILLIN*  REACTION: Myasthenia Gravis (MG) Medication Alert   Procainamide Other (See Comments)   REACTION: Myasthenia Gravis (MG) Medication Alert   Propranolol Other (See Comments)   REACTION: Myasthenia Gravis (MG) Medication Alert   Quinidine Other (See Comments)   REACTION: Myasthenia Gravis (MG) Medication Alert   Quinine Derivatives Other (See Comments)   REACTION: Myasthenia Gravis (MG) Medication Alert   Streptomycin Other (See Comments)   REACTION:  Myasthenia Gravis  (MG) Medication Alert   Timolol Other (See Comments)   REACTION: Myasthenia Gravis (MG) Medication Alert   Tobramycin Other (See Comments)   REACTION: Myasthenia Gravis (MG) Medication Alert   Xeomin [incobotulinumtoxina] Other (See Comments)   REACTION: Myasthenia Gravis (MG) Medication Alert   Zithromax [azithromycin] Other (See Comments)   REACTION: Myasthenia Gravis (MG) Medication Alert        Medication List     TAKE these medications    acyclovir  400 MG tablet Commonly known as: ZOVIRAX  Take 800 mg by mouth daily.   azaTHIOprine  50 MG tablet Commonly known as: IMURAN  Take 150 mg by mouth daily.   Calcium  600+D3 Plus Minerals 600-800 MG-UNIT Tabs Take 2 tablets by mouth daily.   cefadroxil 500 MG capsule Commonly known as: DURICEF Take 2 capsules (1,000 mg total) by mouth 2 (two) times daily for 8 days.   Fish Oil Ultra 1400 MG Caps Take 1,400 mg by mouth daily after lunch.   fluconazole  200 MG tablet Commonly known as: Diflucan  Take 2 tablets on day 1, then take 1 tablet daily for 13 days.   fluticasone  50 MCG/ACT nasal spray Commonly known as: FLONASE  Place 2 sprays into both nostrils daily.   glimepiride 1 MG tablet Commonly known as: AMARYL Take 2 mg by mouth as needed (based on diet).   ibuprofen 200 MG tablet Commonly known as: ADVIL Take 400 mg by mouth daily as needed for moderate pain.   Loratadine 10 MG Caps Take 10 mg by mouth in the morning.   losartan  50 MG tablet Commonly known as: COZAAR  TAKE (1) TABLET BY  MOUTH ONCE DAILY.   metFORMIN 500 MG 24 hr tablet Commonly known as: GLUCOPHAGE-XR Take 1,000 mg by mouth 2 (two) times daily.   omeprazole  20 MG capsule Commonly known as: PRILOSEC Take one capsule 30 minutes before breakfast, may take second dose before evening meal if needed.   OneTouch Verio test strip Generic drug: glucose blood 1 each 3 (three) times daily.   Saw Palmetto 450 MG Caps Take 2,250 mg by mouth daily  after lunch.   Synthroid  137 MCG tablet Generic drug: levothyroxine  Take 137 mcg by mouth daily before breakfast.   tadalafil  20 MG tablet Commonly known as: CIALIS  Take 1 tablet (20 mg total) by mouth as needed.   tamsulosin  0.4 MG Caps capsule Commonly known as: FLOMAX  Take 1 capsule (0.4 mg total) by mouth 2 (two) times daily.   VITAMIN C PO Take 4,000 mg by mouth daily.   vitamin E  180 MG (400 UNITS) capsule Take 800 Units by mouth daily after lunch.        Follow-up Information     Marvine Rush, MD. Schedule an appointment as soon as possible for a visit in 1 week(s).   Specialty: Family Medicine Contact information: [address] [place] [address] [phone]                Allergies[1]  Consultations:    Procedures/Studies:     Subjective:   Discharge Exam: Vitals:   12/11/24 2100 12/12/24 0500  BP:  128/72  Pulse:  70  Resp:  18  Temp: 98.9 F (37.2 C) 97.8 F (36.6 C)  SpO2:  96%    General: Pt is alert, awake, not in acute distress Cardiovascular: rate controlled, S1/S2 + Respiratory: bilateral decreased breath sounds at bases Abdominal: Soft, NT, ND, bowel sounds + Extremities: no edema, no cyanosis    The results of significant diagnostics from this hospitalization (including imaging, microbiology, ancillary and laboratory) are listed below for reference.     Microbiology: Recent Results (from the past 240 hours)  Urine Culture     Status: Abnormal   Collection Time: 12/10/24  3:45 PM   Specimen: Urine, Clean Catch  Result Value Ref Range Status   Specimen Description   Final    URINE, CLEAN CATCH Performed at [facility], [address] [place], [place] [address]    Special Requests   Final    NONE Performed at [facility], [address], [place], [place] [address]    Culture >=100,000 COLONIES/mL KLEBSIELLA PNEUMONIAE (A)  Final   Report Status 12/12/2024 FINAL  Final   Organism ID, Bacteria  KLEBSIELLA PNEUMONIAE (A)  Final      Susceptibility   Klebsiella pneumoniae - MIC*    AMPICILLIN  >=32 RESISTANT Resistant     CEFAZOLIN  (URINE) Value in next row Sensitive      2 SENSITIVEThis is a modified FDA-approved test that has been validated and its performance characteristics determined by the reporting laboratory.  This laboratory is certified under the Clinical Laboratory Improvement Amendments CLIA as qualified to perform high complexity clinical laboratory testing.    CEFEPIME Value in next row Sensitive      2 SENSITIVEThis is a modified FDA-approved test that has been validated and its performance characteristics determined by the reporting laboratory.  This laboratory is certified under the Clinical Laboratory Improvement Amendments CLIA as qualified to perform high complexity clinical laboratory testing.    ERTAPENEM Value in next row Sensitive      2 SENSITIVEThis  is a modified FDA-approved test that has been validated and its performance characteristics determined by the reporting laboratory.  This laboratory is certified under the Clinical Laboratory Improvement Amendments CLIA as qualified to perform high complexity clinical laboratory testing.    CEFTRIAXONE  Value in next row Sensitive      2 SENSITIVEThis is a modified FDA-approved test that has been validated and its performance characteristics determined by the reporting laboratory.  This laboratory is certified under the Clinical Laboratory Improvement Amendments CLIA as qualified to perform high complexity clinical laboratory testing.    CIPROFLOXACIN  Value in next row Sensitive      2 SENSITIVEThis is a modified FDA-approved test that has been validated and its performance characteristics determined by the reporting laboratory.  This laboratory is certified under the Clinical Laboratory Improvement Amendments CLIA as qualified to perform high complexity clinical laboratory testing.    GENTAMICIN Value in next row Sensitive       2 SENSITIVEThis is a modified FDA-approved test that has been validated and its performance characteristics determined by the reporting laboratory.  This laboratory is certified under the Clinical Laboratory Improvement Amendments CLIA as qualified to perform high complexity clinical laboratory testing.    NITROFURANTOIN Value in next row Intermediate      2 SENSITIVEThis is a modified FDA-approved test that has been validated and its performance characteristics determined by the reporting laboratory.  This laboratory is certified under the Clinical Laboratory Improvement Amendments CLIA as qualified to perform high complexity clinical laboratory testing.    TRIMETH /SULFA  Value in next row Sensitive      2 SENSITIVEThis is a modified FDA-approved test that has been validated and its performance characteristics determined by the reporting laboratory.  This laboratory is certified under the Clinical Laboratory Improvement Amendments CLIA as qualified to perform high complexity clinical laboratory testing.    AMPICILLIN /SULBACTAM Value in next row Sensitive      2 SENSITIVEThis is a modified FDA-approved test that has been validated and its performance characteristics determined by the reporting laboratory.  This laboratory is certified under the Clinical Laboratory Improvement Amendments CLIA as qualified to perform high complexity clinical laboratory testing.    PIP/TAZO Value in next row Sensitive      <=4 SENSITIVEThis is a modified FDA-approved test that has been validated and its performance characteristics determined by the reporting laboratory.  This laboratory is certified under the Clinical Laboratory Improvement Amendments CLIA as qualified to perform high complexity clinical laboratory testing.    MEROPENEM Value in next row Sensitive      <=4 SENSITIVEThis is a modified FDA-approved test that has been validated and its performance characteristics determined by the reporting laboratory.  This  laboratory is certified under the Clinical Laboratory Improvement Amendments CLIA as qualified to perform high complexity clinical laboratory testing.    * >=100,000 COLONIES/mL KLEBSIELLA PNEUMONIAE  Culture, blood (routine x 2)     Status: None (Preliminary result)   Collection Time: 12/10/24  4:18 PM   Specimen: BLOOD  Result Value Ref Range Status   Specimen Description   Final    BLOOD RIGHT ANTECUBITAL Performed at [facility], [address] [place], [place] [address]    Special Requests   Final    BOTTLES DRAWN AEROBIC AND ANAEROBIC Blood Culture adequate volume Performed at [facility], [address] [place], [place] [address]    Culture  Setup Time   Final    GRAM NEGATIVE RODS ANAEROBIC BOTTLE ONLY Gram Stain Report Called to,Read  Back By and Verified With: S. STEVENSON [id number], VIRAY,J CRITICAL VALUE NOTED.  VALUE IS CONSISTENT WITH PREVIOUSLY REPORTED AND CALLED VALUE.    Culture   Final    GRAM NEGATIVE RODS IDENTIFICATION TO FOLLOW Performed at [facility], [address] [place], [place] [address]    Report Status PENDING  Incomplete  Culture, blood (routine x 2)     Status: Abnormal (Preliminary result)   Collection Time: 12/10/24  4:34 PM   Specimen: BLOOD  Result Value Ref Range Status   Specimen Description   Final    BLOOD LEFT ANTECUBITAL Performed at [facility], [address] [place], [place] [address]    Special Requests   Final    BOTTLES DRAWN AEROBIC AND ANAEROBIC Blood Culture adequate volume Performed at [facility], [address], [place], [place] [address]    Culture  Setup Time   Final    GRAM NEGATIVE RODS IN BOTH AEROBIC AND ANAEROBIC BOTTLES Gram Stain Report Called to,Read Back By and Verified With: S. STEVENSON [id number], VIRAY,J CRITICAL RESULT CALLED TO, READ BACK BY AND VERIFIED WITH: PHARMD F WILSON [id number] AT 1029 BY CM    Culture (A)  Final    KLEBSIELLA PNEUMONIAE SUSCEPTIBILITIES TO FOLLOW Performed at  [facility], [address] [place], [place] [address]    Report Status PENDING  Incomplete  Blood Culture ID Panel (Reflexed)     Status: Abnormal   Collection Time: 12/10/24  4:34 PM  Result Value Ref Range Status   Enterococcus faecalis NOT DETECTED NOT DETECTED Final   Enterococcus Faecium NOT DETECTED NOT DETECTED Final   Listeria monocytogenes NOT DETECTED NOT DETECTED Final   Staphylococcus species NOT DETECTED NOT DETECTED Final   Staphylococcus aureus (BCID) NOT DETECTED NOT DETECTED Final   Staphylococcus epidermidis NOT DETECTED NOT DETECTED Final   Staphylococcus lugdunensis NOT DETECTED NOT DETECTED Final   Streptococcus species NOT DETECTED NOT DETECTED Final   Streptococcus agalactiae NOT DETECTED NOT DETECTED Final   Streptococcus pneumoniae NOT DETECTED NOT DETECTED Final   Streptococcus pyogenes NOT DETECTED NOT DETECTED Final   A.calcoaceticus-baumannii NOT DETECTED NOT DETECTED Final   Bacteroides fragilis NOT DETECTED NOT DETECTED Final   Enterobacterales DETECTED (A) NOT DETECTED Final    Comment: Enterobacterales represent a large order of gram negative bacteria, not a single organism. CRITICAL RESULT CALLED TO, READ BACK BY AND VERIFIED WITH: PHARMD F WILSON [id number] AT 1026 BY CM    Enterobacter cloacae complex NOT DETECTED NOT DETECTED Final   Escherichia coli NOT DETECTED NOT DETECTED Final   Klebsiella aerogenes NOT DETECTED NOT DETECTED Final   Klebsiella oxytoca NOT DETECTED NOT DETECTED Final   Klebsiella pneumoniae DETECTED (A) NOT DETECTED Final    Comment: CRITICAL RESULT CALLED TO, READ BACK BY AND VERIFIED WITH: PHARMD F WILSON [id number] AT 1026 BY CM    Proteus species NOT DETECTED NOT DETECTED Final   Salmonella species NOT DETECTED NOT DETECTED Final   Serratia marcescens NOT DETECTED NOT DETECTED Final   Haemophilus influenzae NOT DETECTED NOT DETECTED Final   Neisseria meningitidis NOT DETECTED NOT DETECTED Final   Pseudomonas  aeruginosa NOT DETECTED NOT DETECTED Final   Stenotrophomonas maltophilia NOT DETECTED NOT DETECTED Final   Candida albicans NOT DETECTED NOT DETECTED Final   Candida auris NOT DETECTED NOT DETECTED Final   Candida glabrata NOT DETECTED NOT DETECTED Final   Candida krusei NOT DETECTED NOT DETECTED Final   Candida parapsilosis NOT  DETECTED NOT DETECTED Final   Candida tropicalis NOT DETECTED NOT DETECTED Final   Cryptococcus neoformans/gattii NOT DETECTED NOT DETECTED Final   CTX-M ESBL NOT DETECTED NOT DETECTED Final   Carbapenem resistance IMP NOT DETECTED NOT DETECTED Final   Carbapenem resistance KPC NOT DETECTED NOT DETECTED Final   Carbapenem resistance NDM NOT DETECTED NOT DETECTED Final   Carbapenem resist OXA 48 LIKE NOT DETECTED NOT DETECTED Final   Carbapenem resistance VIM NOT DETECTED NOT DETECTED Final    Comment: Performed at [facility], [address] [place], [place] [address]     Labs: BNP (last 3 results) No results for input(s): BNP in the last 8760 hours. Basic Metabolic Panel: Recent Labs  Lab 12/10/24 1618 12/11/24 0433  NA 142 141  K 4.2 3.7  CL 103 105  CO2 21* 23  GLUCOSE 112* 95  BUN 26* 26*  CREATININE 1.20 1.28*  CALCIUM  9.9 8.9  MG 1.0*  --   PHOS 2.5  --    Liver Function Tests: Recent Labs  Lab 12/10/24 1618 12/11/24 0433  AST 25 19  ALT 29 22  ALKPHOS 76 57  BILITOT 0.6 0.7  PROT 7.2 5.9*  ALBUMIN 4.6 3.7   No results for input(s): LIPASE, AMYLASE in the last 168 hours. No results for input(s): AMMONIA in the last 168 hours. CBC: Recent Labs  Lab 12/10/24 1618 12/11/24 0433  WBC 8.4 9.5  NEUTROABS 7.6  --   HGB 14.5 12.1*  HCT 41.9 35.4*  MCV 95.4 94.4  PLT 215 171   Cardiac Enzymes: No results for input(s): CKTOTAL, CKMB, CKMBINDEX, TROPONINI in the last 168 hours. BNP: Invalid input(s): POCBNP CBG: Recent Labs  Lab 12/11/24 1105 12/11/24 1625 12/11/24 2015 12/12/24 0719 12/12/24 1126   GLUCAP 131* 107* 113* 157* 194*   D-Dimer No results for input(s): DDIMER in the last 72 hours. Hgb A1c Recent Labs    12/10/24 1618  HGBA1C 6.4*   Lipid Profile No results for input(s): CHOL, HDL, LDLCALC, TRIG, CHOLHDL, LDLDIRECT in the last 72 hours. Thyroid  function studies No results for input(s): TSH, T4TOTAL, T3FREE, THYROIDAB in the last 72 hours.  Invalid input(s): FREET3 Anemia work up No results for input(s): VITAMINB12, FOLATE, FERRITIN, TIBC, IRON, RETICCTPCT in the last 72 hours. Urinalysis    Component Value Date/Time   COLORURINE YELLOW 12/10/2024 1545   APPEARANCEUR CLEAR 12/10/2024 1545   APPEARANCEUR Cloudy (A) 10/21/2024 1347   LABSPEC 1.016 12/10/2024 1545   PHURINE 5.0 12/10/2024 1545   GLUCOSEU NEGATIVE 12/10/2024 1545   HGBUR NEGATIVE 12/10/2024 1545   BILIRUBINUR NEGATIVE 12/10/2024 1545   BILIRUBINUR Negative 10/21/2024 1347   KETONESUR NEGATIVE 12/10/2024 1545   PROTEINUR 30 (A) 12/10/2024 1545   UROBILINOGEN 0.2 04/03/2023 1916   UROBILINOGEN 0.2 04/01/2014 2010   NITRITE POSITIVE (A) 12/10/2024 1545   LEUKOCYTESUR NEGATIVE 12/10/2024 1545   Sepsis Labs Recent Labs  Lab 12/10/24 1618 12/11/24 0433  WBC 8.4 9.5   Microbiology Recent Results (from the past 240 hours)  Urine Culture     Status: Abnormal   Collection Time: 12/10/24  3:45 PM   Specimen: Urine, Clean Catch  Result Value Ref Range Status   Specimen Description   Final    URINE, CLEAN CATCH Performed at [facility], [address] [place], [place] [address]    Special Requests   Final    NONE Performed at [facility], [address] [place], [place] [address]  Culture >=100,000 COLONIES/mL KLEBSIELLA PNEUMONIAE (A)  Final   Report Status 12/12/2024 FINAL  Final   Organism ID, Bacteria KLEBSIELLA PNEUMONIAE (A)  Final      Susceptibility   Klebsiella pneumoniae - MIC*    AMPICILLIN  >=32 RESISTANT Resistant     CEFAZOLIN   (URINE) Value in next row Sensitive      2 SENSITIVEThis is a modified FDA-approved test that has been validated and its performance characteristics determined by the reporting laboratory.  This laboratory is certified under the Clinical Laboratory Improvement Amendments CLIA as qualified to perform high complexity clinical laboratory testing.    CEFEPIME Value in next row Sensitive      2 SENSITIVEThis is a modified FDA-approved test that has been validated and its performance characteristics determined by the reporting laboratory.  This laboratory is certified under the Clinical Laboratory Improvement Amendments CLIA as qualified to perform high complexity clinical laboratory testing.    ERTAPENEM Value in next row Sensitive      2 SENSITIVEThis is a modified FDA-approved test that has been validated and its performance characteristics determined by the reporting laboratory.  This laboratory is certified under the Clinical Laboratory Improvement Amendments CLIA as qualified to perform high complexity clinical laboratory testing.    CEFTRIAXONE  Value in next row Sensitive      2 SENSITIVEThis is a modified FDA-approved test that has been validated and its performance characteristics determined by the reporting laboratory.  This laboratory is certified under the Clinical Laboratory Improvement Amendments CLIA as qualified to perform high complexity clinical laboratory testing.    CIPROFLOXACIN  Value in next row Sensitive      2 SENSITIVEThis is a modified FDA-approved test that has been validated and its performance characteristics determined by the reporting laboratory.  This laboratory is certified under the Clinical Laboratory Improvement Amendments CLIA as qualified to perform high complexity clinical laboratory testing.    GENTAMICIN Value in next row Sensitive      2 SENSITIVEThis is a modified FDA-approved test that has been validated and its performance characteristics determined by the reporting  laboratory.  This laboratory is certified under the Clinical Laboratory Improvement Amendments CLIA as qualified to perform high complexity clinical laboratory testing.    NITROFURANTOIN Value in next row Intermediate      2 SENSITIVEThis is a modified FDA-approved test that has been validated and its performance characteristics determined by the reporting laboratory.  This laboratory is certified under the Clinical Laboratory Improvement Amendments CLIA as qualified to perform high complexity clinical laboratory testing.    TRIMETH /SULFA  Value in next row Sensitive      2 SENSITIVEThis is a modified FDA-approved test that has been validated and its performance characteristics determined by the reporting laboratory.  This laboratory is certified under the Clinical Laboratory Improvement Amendments CLIA as qualified to perform high complexity clinical laboratory testing.    AMPICILLIN /SULBACTAM Value in next row Sensitive      2 SENSITIVEThis is a modified FDA-approved test that has been validated and its performance characteristics determined by the reporting laboratory.  This laboratory is certified under the Clinical Laboratory Improvement Amendments CLIA as qualified to perform high complexity clinical laboratory testing.    PIP/TAZO Value in next row Sensitive      <=4 SENSITIVEThis is a modified FDA-approved test that has been validated and its performance characteristics determined by the reporting laboratory.  This laboratory is certified under the Clinical Laboratory Improvement Amendments CLIA as qualified to perform high complexity clinical laboratory testing.  MEROPENEM Value in next row Sensitive      <=4 SENSITIVEThis is a modified FDA-approved test that has been validated and its performance characteristics determined by the reporting laboratory.  This laboratory is certified under the Clinical Laboratory Improvement Amendments CLIA as qualified to perform high complexity clinical  laboratory testing.    * >=100,000 COLONIES/mL KLEBSIELLA PNEUMONIAE  Culture, blood (routine x 2)     Status: None (Preliminary result)   Collection Time: 12/10/24  4:18 PM   Specimen: BLOOD  Result Value Ref Range Status   Specimen Description   Final    BLOOD RIGHT ANTECUBITAL Performed at [facility], [address], [place], [place] [address]    Special Requests   Final    BOTTLES DRAWN AEROBIC AND ANAEROBIC Blood Culture adequate volume Performed at [facility], [address] [place], [place] [address]    Culture  Setup Time   Final    GRAM NEGATIVE RODS ANAEROBIC BOTTLE ONLY Gram Stain Report Called to,Read Back By and Verified With: S. STEVENSON [id number], VIRAY,J CRITICAL VALUE NOTED.  VALUE IS CONSISTENT WITH PREVIOUSLY REPORTED AND CALLED VALUE.    Culture   Final    GRAM NEGATIVE RODS IDENTIFICATION TO FOLLOW Performed at [facility], [address] [place], [place] [address]    Report Status PENDING  Incomplete  Culture, blood (routine x 2)     Status: Abnormal (Preliminary result)   Collection Time: 12/10/24  4:34 PM   Specimen: BLOOD  Result Value Ref Range Status   Specimen Description   Final    BLOOD LEFT ANTECUBITAL Performed at [facility], [address], [place], [place] [address]    Special Requests   Final    BOTTLES DRAWN AEROBIC AND ANAEROBIC Blood Culture adequate volume Performed at [facility], [address], [place], [place] [address]    Culture  Setup Time   Final    GRAM NEGATIVE RODS IN BOTH AEROBIC AND ANAEROBIC BOTTLES Gram Stain Report Called to,Read Back By and Verified With: S. STEVENSON [phone] [id number], VIRAY,J CRITICAL RESULT CALLED TO, READ BACK BY AND VERIFIED WITH: PHARMD F WILSON [id number] AT 1029 BY CM    Culture (A)  Final    KLEBSIELLA PNEUMONIAE SUSCEPTIBILITIES TO FOLLOW Performed at [facility], [address] [place], [place] [address]    Report Status PENDING  Incomplete  Blood Culture ID Panel  (Reflexed)     Status: Abnormal   Collection Time: 12/10/24  4:34 PM  Result Value Ref Range Status   Enterococcus faecalis NOT DETECTED NOT DETECTED Final   Enterococcus Faecium NOT DETECTED NOT DETECTED Final   Listeria monocytogenes NOT DETECTED NOT DETECTED Final   Staphylococcus species NOT DETECTED NOT DETECTED Final   Staphylococcus aureus (BCID) NOT DETECTED NOT DETECTED Final   Staphylococcus epidermidis NOT DETECTED NOT DETECTED Final   Staphylococcus lugdunensis NOT DETECTED NOT DETECTED Final   Streptococcus species NOT DETECTED NOT DETECTED Final   Streptococcus agalactiae NOT DETECTED NOT DETECTED Final   Streptococcus pneumoniae NOT DETECTED NOT DETECTED Final   Streptococcus pyogenes NOT DETECTED NOT DETECTED Final   A.calcoaceticus-baumannii NOT DETECTED NOT DETECTED Final   Bacteroides fragilis NOT DETECTED NOT DETECTED Final   Enterobacterales DETECTED (A) NOT DETECTED Final    Comment: Enterobacterales represent a large order of gram negative bacteria, not a single organism. CRITICAL RESULT CALLED TO, READ BACK BY AND VERIFIED WITH: PHARMD F WILSON [id number] AT 1026 BY CM  Enterobacter cloacae complex NOT DETECTED NOT DETECTED Final   Escherichia coli NOT DETECTED NOT DETECTED Final   Klebsiella aerogenes NOT DETECTED NOT DETECTED Final   Klebsiella oxytoca NOT DETECTED NOT DETECTED Final   Klebsiella pneumoniae DETECTED (A) NOT DETECTED Final    Comment: CRITICAL RESULT CALLED TO, READ BACK BY AND VERIFIED WITH: PHARMD F WILSON [id number] AT 1026 BY CM    Proteus species NOT DETECTED NOT DETECTED Final   Salmonella species NOT DETECTED NOT DETECTED Final   Serratia marcescens NOT DETECTED NOT DETECTED Final   Haemophilus influenzae NOT DETECTED NOT DETECTED Final   Neisseria meningitidis NOT DETECTED NOT DETECTED Final   Pseudomonas aeruginosa NOT DETECTED NOT DETECTED Final   Stenotrophomonas maltophilia NOT DETECTED NOT DETECTED Final   Candida albicans NOT  DETECTED NOT DETECTED Final   Candida auris NOT DETECTED NOT DETECTED Final   Candida glabrata NOT DETECTED NOT DETECTED Final   Candida krusei NOT DETECTED NOT DETECTED Final   Candida parapsilosis NOT DETECTED NOT DETECTED Final   Candida tropicalis NOT DETECTED NOT DETECTED Final   Cryptococcus neoformans/gattii NOT DETECTED NOT DETECTED Final   CTX-M ESBL NOT DETECTED NOT DETECTED Final   Carbapenem resistance IMP NOT DETECTED NOT DETECTED Final   Carbapenem resistance KPC NOT DETECTED NOT DETECTED Final   Carbapenem resistance NDM NOT DETECTED NOT DETECTED Final   Carbapenem resist OXA 48 LIKE NOT DETECTED NOT DETECTED Final   Carbapenem resistance VIM NOT DETECTED NOT DETECTED Final    Comment: Performed at [facility], [address] [place], [place] [address]     Time coordinating discharge: 35 minutes  SIGNED:   Derryl Duval, MD  [facility] 12/12/2024, 2:03 PM       [1]  Allergies Allergen Reactions   Levofloxacin Anaphylaxis   Tequin [Gatifloxacin] Anaphylaxis   Iodinated Contrast Media     MD told pt cannot have    Levothyroxine  Sodium Other (See Comments)    Myasthenia Gravis (MG) Medication Alert   Limonene Other (See Comments) and Rash    REACTION:Myasthenia Gravis (MG) Medication Alert REACTION:Myasthenia Gravis (MG) Medication Alert   Magnesium-Containing Compounds Rash and Other (See Comments)     Myasthenia gravis related  Denies rash.    Aminoglycosides Other (See Comments)    REACTION: Myasthenia Gravis (MG) Medication Alert   Avelox [Moxifloxacin Hcl In Nacl] Other (See Comments)    REACTION: FLUOROQUINOLONE ANTIBIOTICS: Myasthenia Gravis (MG) Medication Alert   Botox [Onabotulinumtoxina] Other (See Comments)    REACTION: Myasthenia Gravis (MG) Medication Alert   Botulinum Toxins Other (See Comments)    REACTION: Myasthenia Gravis (MG) Medication Alert   Calcium  Channel Blockers Other (See Comments)    (BLOOD PRESSURE  MEDICATION) REACTION: Myasthenia Gravis (MG) Medication Alert   Ciprofloxacin  Other (See Comments)    REACTION: FLUOROQUINOLONE ANTIBIOTICS: Myasthenia Gravis (MG) Medication Alert   Curare [Tubocurarine] Other (See Comments)    (Usually only used during surgery)  REACTION: Myasthenia Gravis (MG) Medication Alert   Dysport [Abobotulinumtoxina] Other (See Comments)    REACTION: Myasthenia Gravis (MG) Medication Alert   Erythromycin Other (See Comments)    REACTION: Myasthenia Gravis (MG) Medication Alert   Factive [Gemifloxacin] Other (See Comments)    REACTION: FLUOROQUINOLONE ANTIBIOTICS: Myasthenia Gravis (MG) Medication Alert   Floxin [Ofloxacin] Other (See Comments)    REACTION: FLUOROQUINOLONE ANTIBIOTICS: Myasthenia Gravis (MG) Medication Alert   Gentamycin [Gentamicin] Other (See Comments)    REACTION:Myasthenia Gravis (MG) Medication Alert   Interferons Other (See  Comments)    REACTION: Myasthenia Gravis (MG) Medication Alert   Kanamycin Other (See Comments)    REACTION: Myasthenia Gravis (MG) Medication Alert   Ketek [Telithromycin] Other (See Comments)    REACTION: Myasthenia Gravis (MG) Medication Alert   Levaquin [Levofloxacin In D5w] Other (See Comments)    REACTION: FLUOROQUINOLONE ANTIBIOTICS: Myasthenia Gravis (MG) Medication Alert   Levofloxacin Other (See Comments)    Myasthenia Gravis (MG) Medication Alert   Macrolides And Ketolides Other (See Comments)    REACTION: Myasthenia Gravis (MG) Medication Alert REACTION: Myasthenia Gravis (MG) Medication Alert   Myobloc [Rimabotulinumtoxinb] Other (See Comments)    REACTION: Myasthenia Gravis (MG) Medication Alert   Neomycin Other (See Comments)    REACTION: Myasthenia Gravis (MG) Medication Alert   Noroxin [Norfloxacin] Other (See Comments)    REACTION: FLUOROQUINOLONE ANTIBIOTICS: Myasthenia Gravis (MG) Medication Alert   Other Other (See Comments)    MAGNESIUM SALTS: Milk of Magnesia, some antacids (Maalox,  Mylanta) REACTION: Myasthenia Gravis (MG) Medication Alerta   Penicillamine Other (See Comments)    D-PENICILLAMINE *DO NOT CONFUSE WITH PENICILLIN*  REACTION: Myasthenia Gravis (MG) Medication Alert   Procainamide Other (See Comments)    REACTION: Myasthenia Gravis (MG) Medication Alert   Propranolol Other (See Comments)    REACTION: Myasthenia Gravis (MG) Medication Alert   Quinidine Other (See Comments)    REACTION: Myasthenia Gravis (MG) Medication Alert   Quinine Derivatives Other (See Comments)    REACTION: Myasthenia Gravis (MG) Medication Alert   Streptomycin Other (See Comments)    REACTION:  Myasthenia Gravis (MG) Medication Alert   Timolol Other (See Comments)    REACTION: Myasthenia Gravis (MG) Medication Alert   Tobramycin Other (See Comments)    REACTION: Myasthenia Gravis (MG) Medication Alert   Xeomin [Incobotulinumtoxina] Other (See Comments)    REACTION: Myasthenia Gravis (MG) Medication Alert   Zithromax [Azithromycin] Other (See Comments)    REACTION: Myasthenia Gravis (MG) Medication Alert

## 2024-12-13 LAB — CULTURE, BLOOD (ROUTINE X 2)
Special Requests: ADEQUATE
Special Requests: ADEQUATE

## 2025-01-27 ENCOUNTER — Ambulatory Visit: Payer: Self-pay | Admitting: Student

## 2025-03-03 ENCOUNTER — Ambulatory Visit (INDEPENDENT_AMBULATORY_CARE_PROVIDER_SITE_OTHER): Admitting: Otolaryngology

## 2025-04-01 ENCOUNTER — Ambulatory Visit: Admitting: Urology
